# Patient Record
Sex: Female | Born: 1960 | Race: Black or African American | Hispanic: No | Marital: Married | Smoking: Former smoker
Health system: Southern US, Community
[De-identification: ages and names within clinical notes are randomized; demographics above are authoritative.]

## PROBLEM LIST (undated history)

## (undated) DIAGNOSIS — G629 Polyneuropathy, unspecified: Secondary | ICD-10-CM

## (undated) DIAGNOSIS — Z9641 Presence of insulin pump (external) (internal): Secondary | ICD-10-CM

## (undated) DIAGNOSIS — I10 Essential (primary) hypertension: Secondary | ICD-10-CM

## (undated) DIAGNOSIS — K5790 Diverticulosis of intestine, part unspecified, without perforation or abscess without bleeding: Secondary | ICD-10-CM

## (undated) DIAGNOSIS — K219 Gastro-esophageal reflux disease without esophagitis: Secondary | ICD-10-CM

## (undated) DIAGNOSIS — E7800 Pure hypercholesterolemia, unspecified: Secondary | ICD-10-CM

## (undated) DIAGNOSIS — F419 Anxiety disorder, unspecified: Secondary | ICD-10-CM

## (undated) DIAGNOSIS — M1990 Unspecified osteoarthritis, unspecified site: Secondary | ICD-10-CM

## (undated) HISTORY — PX: FOOT SURGERY: SHX648

## (undated) HISTORY — DX: Diverticulosis of intestine, part unspecified, without perforation or abscess without bleeding: K57.90

## (undated) HISTORY — DX: Unspecified osteoarthritis, unspecified site: M19.90

## (undated) HISTORY — PX: ABDOMINAL HYSTERECTOMY: SHX81

## (undated) HISTORY — DX: Polyneuropathy, unspecified: G62.9

---

## 2001-02-12 ENCOUNTER — Emergency Department (HOSPITAL_COMMUNITY): Admission: EM | Admit: 2001-02-12 | Discharge: 2001-02-12 | Payer: Self-pay | Admitting: Emergency Medicine

## 2001-02-13 ENCOUNTER — Ambulatory Visit (HOSPITAL_COMMUNITY): Admission: RE | Admit: 2001-02-13 | Discharge: 2001-02-13 | Payer: Self-pay | Admitting: Internal Medicine

## 2001-02-13 ENCOUNTER — Encounter: Payer: Self-pay | Admitting: Internal Medicine

## 2001-07-17 ENCOUNTER — Encounter: Payer: Self-pay | Admitting: Emergency Medicine

## 2001-07-17 ENCOUNTER — Emergency Department (HOSPITAL_COMMUNITY): Admission: EM | Admit: 2001-07-17 | Discharge: 2001-07-18 | Payer: Self-pay | Admitting: Emergency Medicine

## 2001-11-09 ENCOUNTER — Emergency Department (HOSPITAL_COMMUNITY): Admission: EM | Admit: 2001-11-09 | Discharge: 2001-11-09 | Payer: Self-pay | Admitting: Emergency Medicine

## 2001-11-09 ENCOUNTER — Emergency Department (HOSPITAL_COMMUNITY): Admission: EM | Admit: 2001-11-09 | Discharge: 2001-11-09 | Payer: Self-pay | Admitting: *Deleted

## 2002-01-03 ENCOUNTER — Encounter: Payer: Self-pay | Admitting: Emergency Medicine

## 2002-01-03 ENCOUNTER — Emergency Department (HOSPITAL_COMMUNITY): Admission: EM | Admit: 2002-01-03 | Discharge: 2002-01-03 | Payer: Self-pay | Admitting: Emergency Medicine

## 2002-01-28 ENCOUNTER — Emergency Department (HOSPITAL_COMMUNITY): Admission: EM | Admit: 2002-01-28 | Discharge: 2002-01-28 | Payer: Self-pay | Admitting: Emergency Medicine

## 2002-11-12 ENCOUNTER — Emergency Department (HOSPITAL_COMMUNITY): Admission: EM | Admit: 2002-11-12 | Discharge: 2002-11-13 | Payer: Self-pay | Admitting: Emergency Medicine

## 2002-11-12 ENCOUNTER — Encounter: Payer: Self-pay | Admitting: Emergency Medicine

## 2003-03-12 ENCOUNTER — Emergency Department (HOSPITAL_COMMUNITY): Admission: EM | Admit: 2003-03-12 | Discharge: 2003-03-12 | Payer: Self-pay | Admitting: Emergency Medicine

## 2003-03-12 ENCOUNTER — Encounter: Payer: Self-pay | Admitting: Emergency Medicine

## 2003-03-17 ENCOUNTER — Encounter: Payer: Self-pay | Admitting: Orthopedic Surgery

## 2003-03-17 ENCOUNTER — Ambulatory Visit: Admission: RE | Admit: 2003-03-17 | Discharge: 2003-03-17 | Payer: Self-pay | Admitting: Orthopedic Surgery

## 2003-08-02 ENCOUNTER — Emergency Department (HOSPITAL_COMMUNITY): Admission: EM | Admit: 2003-08-02 | Discharge: 2003-08-02 | Payer: Self-pay | Admitting: Emergency Medicine

## 2003-10-20 ENCOUNTER — Emergency Department (HOSPITAL_COMMUNITY): Admission: EM | Admit: 2003-10-20 | Discharge: 2003-10-20 | Payer: Self-pay | Admitting: Emergency Medicine

## 2004-06-09 ENCOUNTER — Emergency Department (HOSPITAL_COMMUNITY): Admission: EM | Admit: 2004-06-09 | Discharge: 2004-06-09 | Payer: Self-pay | Admitting: Specialist

## 2004-07-14 ENCOUNTER — Ambulatory Visit (HOSPITAL_COMMUNITY): Admission: RE | Admit: 2004-07-14 | Discharge: 2004-07-14 | Payer: Self-pay | Admitting: Obstetrics and Gynecology

## 2004-09-13 ENCOUNTER — Ambulatory Visit: Payer: Self-pay | Admitting: Orthopedic Surgery

## 2005-02-02 ENCOUNTER — Ambulatory Visit (HOSPITAL_COMMUNITY): Admission: RE | Admit: 2005-02-02 | Discharge: 2005-02-02 | Payer: Self-pay | Admitting: Podiatry

## 2005-06-07 ENCOUNTER — Ambulatory Visit (HOSPITAL_COMMUNITY): Admission: RE | Admit: 2005-06-07 | Discharge: 2005-06-07 | Payer: Self-pay | Admitting: Internal Medicine

## 2005-06-10 ENCOUNTER — Ambulatory Visit (HOSPITAL_COMMUNITY): Admission: RE | Admit: 2005-06-10 | Discharge: 2005-06-10 | Payer: Self-pay | Admitting: Internal Medicine

## 2006-02-15 ENCOUNTER — Emergency Department (HOSPITAL_COMMUNITY): Admission: EM | Admit: 2006-02-15 | Discharge: 2006-02-15 | Payer: Self-pay | Admitting: Emergency Medicine

## 2006-05-24 ENCOUNTER — Emergency Department (HOSPITAL_COMMUNITY): Admission: EM | Admit: 2006-05-24 | Discharge: 2006-05-24 | Payer: Self-pay | Admitting: Emergency Medicine

## 2006-11-01 ENCOUNTER — Emergency Department (HOSPITAL_COMMUNITY): Admission: EM | Admit: 2006-11-01 | Discharge: 2006-11-02 | Payer: Self-pay | Admitting: Emergency Medicine

## 2006-12-08 ENCOUNTER — Emergency Department (HOSPITAL_COMMUNITY): Admission: EM | Admit: 2006-12-08 | Discharge: 2006-12-09 | Payer: Self-pay | Admitting: Emergency Medicine

## 2006-12-11 ENCOUNTER — Ambulatory Visit (HOSPITAL_COMMUNITY): Admission: RE | Admit: 2006-12-11 | Discharge: 2006-12-11 | Payer: Self-pay | Admitting: Internal Medicine

## 2007-01-19 ENCOUNTER — Emergency Department (HOSPITAL_COMMUNITY): Admission: EM | Admit: 2007-01-19 | Discharge: 2007-01-19 | Payer: Self-pay | Admitting: Emergency Medicine

## 2007-05-30 ENCOUNTER — Emergency Department (HOSPITAL_COMMUNITY): Admission: EM | Admit: 2007-05-30 | Discharge: 2007-05-31 | Payer: Self-pay | Admitting: Emergency Medicine

## 2008-01-11 ENCOUNTER — Emergency Department (HOSPITAL_COMMUNITY): Admission: EM | Admit: 2008-01-11 | Discharge: 2008-01-11 | Payer: Self-pay | Admitting: Emergency Medicine

## 2008-06-05 ENCOUNTER — Emergency Department (HOSPITAL_COMMUNITY): Admission: EM | Admit: 2008-06-05 | Discharge: 2008-06-05 | Payer: Self-pay | Admitting: Emergency Medicine

## 2008-11-08 ENCOUNTER — Emergency Department (HOSPITAL_COMMUNITY): Admission: EM | Admit: 2008-11-08 | Discharge: 2008-11-08 | Payer: Self-pay | Admitting: Emergency Medicine

## 2009-01-23 ENCOUNTER — Emergency Department (HOSPITAL_COMMUNITY): Admission: EM | Admit: 2009-01-23 | Discharge: 2009-01-23 | Payer: Self-pay | Admitting: Emergency Medicine

## 2009-08-22 HISTORY — PX: CARDIAC CATHETERIZATION: SHX172

## 2009-10-31 ENCOUNTER — Ambulatory Visit: Payer: Self-pay | Admitting: Internal Medicine

## 2009-10-31 ENCOUNTER — Inpatient Hospital Stay (HOSPITAL_COMMUNITY): Admission: EM | Admit: 2009-10-31 | Discharge: 2009-11-04 | Payer: Self-pay | Admitting: Emergency Medicine

## 2009-11-02 ENCOUNTER — Ambulatory Visit: Payer: Self-pay | Admitting: Internal Medicine

## 2009-11-30 ENCOUNTER — Ambulatory Visit: Payer: Self-pay | Admitting: Internal Medicine

## 2009-11-30 DIAGNOSIS — E782 Mixed hyperlipidemia: Secondary | ICD-10-CM | POA: Insufficient documentation

## 2009-11-30 DIAGNOSIS — K5732 Diverticulitis of large intestine without perforation or abscess without bleeding: Secondary | ICD-10-CM

## 2009-11-30 DIAGNOSIS — F411 Generalized anxiety disorder: Secondary | ICD-10-CM | POA: Insufficient documentation

## 2009-11-30 DIAGNOSIS — K5900 Constipation, unspecified: Secondary | ICD-10-CM | POA: Insufficient documentation

## 2009-11-30 DIAGNOSIS — E785 Hyperlipidemia, unspecified: Secondary | ICD-10-CM

## 2009-12-02 ENCOUNTER — Encounter: Payer: Self-pay | Admitting: Internal Medicine

## 2009-12-07 ENCOUNTER — Ambulatory Visit (HOSPITAL_COMMUNITY): Admission: RE | Admit: 2009-12-07 | Discharge: 2009-12-07 | Payer: Self-pay | Admitting: Internal Medicine

## 2009-12-09 ENCOUNTER — Encounter: Payer: Self-pay | Admitting: Internal Medicine

## 2010-01-04 ENCOUNTER — Ambulatory Visit (HOSPITAL_COMMUNITY): Admission: RE | Admit: 2010-01-04 | Discharge: 2010-01-04 | Payer: Self-pay | Admitting: Internal Medicine

## 2010-01-04 ENCOUNTER — Ambulatory Visit: Payer: Self-pay | Admitting: Internal Medicine

## 2010-01-04 HISTORY — PX: COLONOSCOPY: SHX5424

## 2010-01-05 ENCOUNTER — Encounter: Payer: Self-pay | Admitting: Internal Medicine

## 2010-01-07 ENCOUNTER — Telehealth (INDEPENDENT_AMBULATORY_CARE_PROVIDER_SITE_OTHER): Payer: Self-pay

## 2010-02-01 ENCOUNTER — Encounter (INDEPENDENT_AMBULATORY_CARE_PROVIDER_SITE_OTHER): Payer: Self-pay | Admitting: General Surgery

## 2010-02-01 ENCOUNTER — Inpatient Hospital Stay (HOSPITAL_COMMUNITY): Admission: RE | Admit: 2010-02-01 | Discharge: 2010-02-05 | Payer: Self-pay | Admitting: General Surgery

## 2010-02-01 HISTORY — PX: OTHER SURGICAL HISTORY: SHX169

## 2010-02-24 ENCOUNTER — Encounter: Payer: Self-pay | Admitting: Internal Medicine

## 2010-05-03 ENCOUNTER — Emergency Department (HOSPITAL_COMMUNITY): Admission: EM | Admit: 2010-05-03 | Discharge: 2010-05-03 | Payer: Self-pay | Admitting: Emergency Medicine

## 2010-06-23 ENCOUNTER — Ambulatory Visit: Payer: Self-pay | Admitting: Cardiology

## 2010-06-23 ENCOUNTER — Encounter: Payer: Self-pay | Admitting: Emergency Medicine

## 2010-06-24 ENCOUNTER — Ambulatory Visit: Payer: Self-pay | Admitting: Cardiovascular Disease

## 2010-06-24 ENCOUNTER — Encounter (INDEPENDENT_AMBULATORY_CARE_PROVIDER_SITE_OTHER): Payer: Self-pay | Admitting: Pulmonary Disease

## 2010-06-24 ENCOUNTER — Ambulatory Visit (HOSPITAL_COMMUNITY): Admission: EM | Admit: 2010-06-24 | Discharge: 2010-06-25 | Payer: Self-pay | Admitting: Cardiovascular Disease

## 2010-09-08 ENCOUNTER — Emergency Department (HOSPITAL_COMMUNITY)
Admission: EM | Admit: 2010-09-08 | Discharge: 2010-09-08 | Payer: Self-pay | Source: Home / Self Care | Admitting: Emergency Medicine

## 2010-09-21 NOTE — Assessment & Plan Note (Signed)
Summary: diverticulitis,consult done rmr in hos. gu   Primary Care Provider:  [place]  Chief Complaint:  F/U hospital visit.  History of Present Illness: 50 y/o black female w/ hx uncomplicated diverticulitis admitted to [facility] last month and treated w/ cipro/flagyl.  Completed regimen.  c/o int LLQ pain post-prandially "feels like sharp pain & cramps."  Pain everyday last week 10/10 at worst.  Last mins-hrs.  Lots of nausea, no vomiting.  BM w/ some constipation 1 week ago.  Denies rectal bleeding, diarrhea or fever.  c/o occ regurgitation.  1 week ago, noticed boil left groin.  No drainage.    Current Problems (verified): 1)  Anxiety  (ICD-300.00) 2)  Hyperlipidemia  (ICD-272.4) 3)  Diverticulitis of Colon  (ICD-562.11)  Current Medications (verified): 1)  Est Estrogens-Methyltest 1.25-2.5 Mg Tabs (Est Estrogens-Methyltest) .... Take 1 Tablet By Mouth Once A Day 2)  Alprazolam 0.5 Mg Tabs (Alprazolam) .... As Needed 3)  Tylenol Arthritis .... As Needed 4)  Tylenol .... As Needed 5)  Fish Oil .... Take 1 Tablet By Mouth Once A Day 6)  Vitamin D .... Take 1 Tablet By Mouth Once A Day 7)  Hair Nail and Skin Vitamin .... Take 1 Tablet By Mouth Once A Day  Allergies (verified): 1)  ! Sulfa  Past History:  Past Medical History: diverticulitis depression hypercholesterolemia  Past Surgical History:  Total abdominal hysterectomy for apparent benign   uterine mass.  Has had orthopedic procedures in the past.        Family History: Granfather colon ca advanced age  Social History: Smokes one half a pack of cigarettes per day.  She is married.  She has one son.  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     Alcohol Use - no Illicit Drug Use - no Drug Use:  no  Review of Systems General:  Complains of fatigue and malaise; denies fever, chills, sweats, anorexia, weakness, weight loss, and sleep  disorder. CV:  Denies chest pains, angina, palpitations, syncope, dyspnea on exertion, orthopnea, PND, peripheral edema, and claudication. Resp:  Denies dyspnea at rest, dyspnea with exercise, cough, sputum, wheezing, coughing up blood, and pleurisy. GI:  Denies difficulty swallowing, pain on swallowing, vomiting, vomiting blood, jaundice, bloody BM's, black BMs, and fecal incontinence. GU:  Denies urinary burning, blood in urine, nocturnal urination, urinary frequency, and urinary incontinence. Derm:  Denies rash, itching, dry skin, hives, moles, warts, and unhealing ulcers. Psych:  Denies depression, anxiety, memory loss, suicidal ideation, hallucinations, paranoia, phobia, and confusion. Heme:  Denies bruising, bleeding, and enlarged lymph nodes.  Vital Signs:  Patient profile:   50 year old female Height:      63 inches Weight:      192 pounds BMI:     34.13 Temp:     98.1 degrees F oral Pulse rate:   80 / minute BP sitting:   124 / 82  (left arm) Cuff size:   regular  Vitals Entered By: Cloria Spring LPN (November 30, 2009 1:42 PM)  Physical Exam  General:  Well developed, well nourished, no acute distress. Head:  Normocephalic and atraumatic. Eyes:  Sclera clear, no icterus. Mouth:  No deformity or lesions, dentition normal. Neck:  Supple; no masses or thyromegaly. Lungs:  Clear throughout to auscultation. Heart:  Regular rate and rhythm; no murmurs, rubs,  or bruits. Abdomen:  Positive bowel sounds x 4, soft,ND, tender  to BLQ, without guarding and with rebound.   Rectal:  deferred until time of colonoscopy.   Genitalia:  Examined pt's left labia where she noted "boil" last week without evidence of significant lesion or exudates today. Msk:  Symmetrical with no gross deformities. Normal posture. Pulses:  Normal pulses noted. Extremities:  No clubbing, cyanosis, edema or deformities noted. Neurologic:  Alert and  oriented x4;  grossly normal neurologically. Skin:  Intact  without significant lesions or rashes. Cervical Nodes:  No significant cervical adenopathy. Axillary Nodes:  No significant axillary adenopathy. Inguinal Nodes:  No significant inguinal adenopathy. Psych:  Alert and cooperative. Normal mood and affect.  Impression & Recommendations:  Problem # 1:  DIVERTICULITIS OF COLON (ICD-562.11)  50 y/o black female w/ recent uncomplicated diverticulitis requiring hospital admission 1 month ago here for follow-up.  She is having persistent abd pain post antibiotics, mostly LLQ.  She also has chronic constipation which may be contributing to her pain, but  I feel we need to r/o refractory diverticulitis.  She also needs colonoscopy to r/o underlying pathology and this will be scheduled once diverticulitis has resolved.  Orders: Est. Patient Level III (16109)  Problem # 2:  CONSTIPATION (ICD-564.00)  See #1  Orders: Est. Patient Level III (60454)  Patient Instructions: 1)  Use vicodin as needed for pain 2)  To ER if severe pain, fever, Nausea or vomiting. 3)  I will call w/ CT results. Prescriptions: VICODIN 5-500 MG TABS (HYDROCODONE-ACETAMINOPHEN) 1 by mouth q 4-6 hrs as needed pain  #20 x 0   Entered and Authorized by:   Joselyn Arrow FNP-BC   Signed by:   Joselyn Arrow FNP-BC on 11/30/2009   Method used:   Print then Give to Patient   RxID:   [id number]

## 2010-09-21 NOTE — Letter (Signed)
Summary: discharge summary-dr. Lovell Sheehan  discharge summary-dr. Lovell Sheehan   Imported By: Rosine Beat 02/24/2010 17:01:41  _____________________________________________________________________  External Attachment:    Type:   Image     Comment:   External Document

## 2010-09-21 NOTE — Letter (Signed)
Summary: CT SCAN ORDER  CT SCAN ORDER   Imported By: Ave Filter 12/02/2009 10:32:11  _____________________________________________________________________  External Attachment:    Type:   Image     Comment:   External Document

## 2010-09-21 NOTE — Letter (Signed)
Summary: Patient Notice, Colon Biopsy Results  [facility]  [address]   [place], [place] [address]   Phone: [phone]  Fax: [phone]       Jan 05, 2010   Sarah [name] [address] [place], [place]  [address] [birth year]    Dear Ms. Glock,  I am pleased to inform you that the biopsies taken during your recent colonoscopy did not show any evidence of cancer upon pathologic examination.  Additional information/recommendations:  You should have a repeat colonoscopy examination  in 10 years.  Please call us if you are having persistent problems or have questions about your condition that have not been fully answered at this time.  Sincerely,    R. Roetta Sessions MD, FACP [facility] Ph: [phone]    Fax: [phone]   Appended Document: Patient Notice, Colon Biopsy Results Reminder placed in IDX.

## 2010-09-21 NOTE — Letter (Signed)
Summary: TCS ORDER  TCS ORDER   Imported By: Ave Filter 12/09/2009 10:43:49  _____________________________________________________________________  External Attachment:    Type:   Image     Comment:   External Document

## 2010-09-21 NOTE — Progress Notes (Signed)
Summary: phone note  Phone Note Call from Patient   Caller: Patient Summary of Call: Pt called to get results of her TCS/ path. I gave her the info. She asked if Dr. Jena Gauss wanted her to have surgery. I read her the recommendations of the elective segmental resection of colon. She said she wanted to know if Dr. Jena Gauss thought that she would benefit from it. She also said she had some blood in her urine, and i told her she probably should see PCP for that. Told her i would make Dr. Jena Gauss aware. Initial call taken by: Cloria Spring LPN,  Jan 07, 2010 9:18 AM     Appended Document: phone note as discussed w her on the day of procedure; she should see a surgeon to explore the option of having a partial resection to lessen the chances of a future severe bout of diverticulitis  Appended Document: phone note LM to call.  Appended Document: phone note Pt would like for you to recommend surgeon. No preference of local or out of town.  Appended Document: phone note Dr. Lovell Sheehan  Appended Document: phone note LMOM to call.  Appended Document: phone note Pt informed.

## 2010-09-21 NOTE — Consult Note (Signed)
Summary: Consultation Report  Consultation Report   Imported By: Diana Eves 11/02/2009 14:47:34  _____________________________________________________________________  External Attachment:    Type:   Image     Comment:   External Document

## 2010-09-30 ENCOUNTER — Emergency Department (HOSPITAL_COMMUNITY)
Admission: EM | Admit: 2010-09-30 | Discharge: 2010-10-01 | Disposition: A | Discharge status: Home / Self Care | Payer: BC Managed Care – PPO | Attending: Emergency Medicine | Admitting: Emergency Medicine

## 2010-09-30 DIAGNOSIS — R109 Unspecified abdominal pain: Secondary | ICD-10-CM | POA: Insufficient documentation

## 2010-09-30 DIAGNOSIS — J029 Acute pharyngitis, unspecified: Secondary | ICD-10-CM | POA: Insufficient documentation

## 2010-09-30 DIAGNOSIS — H6690 Otitis media, unspecified, unspecified ear: Secondary | ICD-10-CM | POA: Insufficient documentation

## 2010-09-30 LAB — COMPREHENSIVE METABOLIC PANEL
ALT: 19 U/L (ref 0–35)
AST: 14 U/L (ref 0–37)
Albumin: 3.3 g/dL — ABNORMAL LOW (ref 3.5–5.2)
Alkaline Phosphatase: 65 U/L (ref 39–117)
BUN: 13 mg/dL (ref 6–23)
Calcium: 9.3 mg/dL (ref 8.4–10.5)
Creatinine, Ser: 0.66 mg/dL (ref 0.4–1.2)
GFR calc Af Amer: >60 mL/min (ref >60)
Potassium: 4 mEq/L (ref 3.5–5.1)
Sodium: 139 mEq/L (ref 135–145)
Total Bilirubin: 0.4 mg/dL (ref 0.3–1.2)
Total Protein: 7 g/dL (ref 6.0–8.3)

## 2010-09-30 LAB — RAPID STREP SCREEN (MED CTR MEBANE ONLY): Streptococcus, Group A Screen (Direct): NEGATIVE

## 2010-09-30 LAB — URINALYSIS, ROUTINE W REFLEX MICROSCOPIC
Bilirubin Urine: NEGATIVE
Hgb urine dipstick: NEGATIVE
Ketones, ur: NEGATIVE mg/dL
Nitrite: NEGATIVE
Specific Gravity, Urine: 1.02 (ref 1.005–1.030)
Urine Glucose, Fasting: NEGATIVE mg/dL
Urobilinogen, UA: 0.2 mg/dL (ref 0.0–1.0)
pH: 7 (ref 5.0–8.0)

## 2010-10-01 LAB — DIFFERENTIAL
Basophils Relative: 0 % (ref 0–1)
Eosinophils Absolute: 0.2 10*3/uL (ref 0.0–0.7)
Lymphs Abs: 5.1 10*3/uL — ABNORMAL HIGH (ref 0.7–4.0)
Monocytes Absolute: 1 10*3/uL (ref 0.1–1.0)
Neutro Abs: 4.5 10*3/uL (ref 1.7–7.7)
Neutrophils Relative %: 42 % — ABNORMAL LOW (ref 43–77)

## 2010-10-01 LAB — CBC
HCT: 35 % — ABNORMAL LOW (ref 36.0–46.0)
MCH: 29.8 pg (ref 26.0–34.0)
MCHC: 36.9 g/dL — ABNORMAL HIGH (ref 30.0–36.0)
MCV: 80.8 fL (ref 78.0–100.0)
RDW: 14.5 % (ref 11.5–15.5)

## 2010-11-02 LAB — CARDIAC PANEL(CRET KIN+CKTOT+MB+TROPI)
CK, MB: 1.6 ng/mL (ref 0.3–4.0)
Relative Index: 1.6 (ref 0.0–2.5)
Total CK: 103 U/L (ref 7–177)
Total CK: 117 U/L (ref 7–177)
Troponin I: 0.01 ng/mL (ref 0.00–0.06)

## 2010-11-02 LAB — COMPREHENSIVE METABOLIC PANEL
AST: 17 U/L (ref 0–37)
Albumin: 3.6 g/dL (ref 3.5–5.2)
BUN: 7 mg/dL (ref 6–23)
Calcium: 8.9 mg/dL (ref 8.4–10.5)
Creatinine, Ser: 0.65 mg/dL (ref 0.4–1.2)
GFR calc Af Amer: >60 mL/min (ref >60)
GFR calc non Af Amer: >60 mL/min (ref >60)

## 2010-11-02 LAB — CBC
MCH: 29.8 pg (ref 26.0–34.0)
MCHC: 34.2 g/dL (ref 30.0–36.0)
Platelets: 319 10*3/uL (ref 150–400)

## 2010-11-02 LAB — DIFFERENTIAL
Eosinophils Relative: 2 % (ref 0–5)
Lymphocytes Relative: 44 % (ref 12–46)
Lymphs Abs: 3.4 10*3/uL (ref 0.7–4.0)
Neutro Abs: 3.8 10*3/uL (ref 1.7–7.7)

## 2010-11-02 LAB — POCT CARDIAC MARKERS
CKMB, poc: 1.2 ng/mL (ref 1.0–8.0)
Myoglobin, poc: 55.5 ng/mL (ref 12–200)

## 2010-11-02 LAB — MRSA PCR SCREENING: MRSA by PCR: NEGATIVE

## 2010-11-02 LAB — D-DIMER, QUANTITATIVE: D-Dimer, Quant: 0.3 ug/mL-FEU (ref 0.00–0.48)

## 2010-11-03 LAB — BASIC METABOLIC PANEL
BUN: 6 mg/dL (ref 6–23)
Calcium: 8.6 mg/dL (ref 8.4–10.5)
Chloride: 107 mEq/L (ref 96–112)
Creatinine, Ser: 0.66 mg/dL (ref 0.4–1.2)
GFR calc Af Amer: >60 mL/min (ref >60)
GFR calc non Af Amer: >60 mL/min (ref >60)

## 2010-11-03 LAB — CBC
Platelets: 339 10*3/uL (ref 150–400)
RBC: 4.55 MIL/uL (ref 3.87–5.11)
RDW: 14.7 % (ref 11.5–15.5)
WBC: 8.1 10*3/uL (ref 4.0–10.5)

## 2010-11-07 LAB — BASIC METABOLIC PANEL
Calcium: 8.5 mg/dL (ref 8.4–10.5)
GFR calc Af Amer: >60 mL/min (ref >60)
GFR calc non Af Amer: >60 mL/min (ref >60)
Glucose, Bld: 128 mg/dL — ABNORMAL HIGH (ref 70–99)
Potassium: 3.7 mEq/L (ref 3.5–5.1)
Sodium: 136 mEq/L (ref 135–145)

## 2010-11-07 LAB — DIFFERENTIAL
Basophils Absolute: 0 10*3/uL (ref 0.0–0.1)
Eosinophils Relative: 1 % (ref 0–5)
Lymphocytes Relative: 29 % (ref 12–46)
Lymphs Abs: 3.1 10*3/uL (ref 0.7–4.0)
Monocytes Absolute: 1 10*3/uL (ref 0.1–1.0)
Neutro Abs: 6.6 10*3/uL (ref 1.7–7.7)

## 2010-11-07 LAB — CBC
HCT: 36.4 % (ref 36.0–46.0)
Hemoglobin: 12.4 g/dL (ref 12.0–15.0)
RDW: 14.7 % (ref 11.5–15.5)
WBC: 11 10*3/uL — ABNORMAL HIGH (ref 4.0–10.5)

## 2010-11-07 LAB — ALBUMIN: Albumin: 3 g/dL — ABNORMAL LOW (ref 3.5–5.2)

## 2010-11-07 LAB — MAGNESIUM: Magnesium: 1.8 mg/dL (ref 1.5–2.5)

## 2010-11-08 LAB — DIFFERENTIAL
Basophils Absolute: 0 10*3/uL (ref 0.0–0.1)
Eosinophils Relative: 1 % (ref 0–5)
Lymphocytes Relative: 27 % (ref 12–46)
Neutrophils Relative %: 63 % (ref 43–77)

## 2010-11-08 LAB — CBC
HCT: 35.3 % — ABNORMAL LOW (ref 36.0–46.0)
Hemoglobin: 13.9 g/dL (ref 12.0–15.0)
Platelets: 293 10*3/uL (ref 150–400)
RBC: 4.6 MIL/uL (ref 3.87–5.11)
RDW: 14.4 % (ref 11.5–15.5)
WBC: 7.9 10*3/uL (ref 4.0–10.5)

## 2010-11-08 LAB — CROSSMATCH
ABO/RH(D): B POS
Antibody Screen: NEGATIVE

## 2010-11-08 LAB — BASIC METABOLIC PANEL
BUN: 5 mg/dL — ABNORMAL LOW (ref 6–23)
Calcium: 8 mg/dL — ABNORMAL LOW (ref 8.4–10.5)
Calcium: 9.2 mg/dL (ref 8.4–10.5)
Chloride: 106 mEq/L (ref 96–112)
Creatinine, Ser: 0.56 mg/dL (ref 0.4–1.2)
Creatinine, Ser: 0.67 mg/dL (ref 0.4–1.2)
GFR calc Af Amer: >60 mL/min (ref >60)
GFR calc non Af Amer: >60 mL/min (ref >60)
Glucose, Bld: 125 mg/dL — ABNORMAL HIGH (ref 70–99)
Potassium: 3.8 mEq/L (ref 3.5–5.1)
Sodium: 138 mEq/L (ref 135–145)

## 2010-11-08 LAB — ABO/RH: ABO/RH(D): B POS

## 2010-11-08 LAB — MAGNESIUM: Magnesium: 1.6 mg/dL (ref 1.5–2.5)

## 2010-11-08 LAB — GLUCOSE, CAPILLARY: Glucose-Capillary: 118 mg/dL — ABNORMAL HIGH (ref 70–99)

## 2010-11-08 LAB — SURGICAL PCR SCREEN

## 2010-11-12 LAB — CBC
HCT: 38.6 % (ref 36.0–46.0)
Hemoglobin: 14.4 g/dL (ref 12.0–15.0)
MCHC: 35.3 g/dL (ref 30.0–36.0)
MCV: 87.7 fL (ref 78.0–100.0)
Platelets: 318 10*3/uL (ref 150–400)
RDW: 13.5 % (ref 11.5–15.5)
RDW: 13.6 % (ref 11.5–15.5)
RDW: 13.9 % (ref 11.5–15.5)
WBC: 7.9 10*3/uL (ref 4.0–10.5)

## 2010-11-12 LAB — COMPREHENSIVE METABOLIC PANEL
ALT: 20 U/L (ref 0–35)
AST: 17 U/L (ref 0–37)
Albumin: 3.5 g/dL (ref 3.5–5.2)
Alkaline Phosphatase: 62 U/L (ref 39–117)
GFR calc Af Amer: >60 mL/min (ref >60)
Glucose, Bld: 141 mg/dL — ABNORMAL HIGH (ref 70–99)
Potassium: 3.3 mEq/L — ABNORMAL LOW (ref 3.5–5.1)
Sodium: 139 mEq/L (ref 135–145)
Total Protein: 7.1 g/dL (ref 6.0–8.3)

## 2010-11-12 LAB — DIFFERENTIAL
Basophils Absolute: 0 10*3/uL (ref 0.0–0.1)
Basophils Absolute: 0.1 10*3/uL (ref 0.0–0.1)
Basophils Relative: 0 % (ref 0–1)
Basophils Relative: 1 % (ref 0–1)
Eosinophils Absolute: 0.1 10*3/uL (ref 0.0–0.7)
Eosinophils Absolute: 0.2 10*3/uL (ref 0.0–0.7)
Eosinophils Relative: 1 % (ref 0–5)
Lymphocytes Relative: 34 % (ref 12–46)
Lymphs Abs: 2.7 10*3/uL (ref 0.7–4.0)
Lymphs Abs: 3.7 10*3/uL (ref 0.7–4.0)
Monocytes Absolute: 0.9 10*3/uL (ref 0.1–1.0)
Monocytes Absolute: 1.2 10*3/uL — ABNORMAL HIGH (ref 0.1–1.0)
Monocytes Relative: 11 % (ref 3–12)
Monocytes Relative: 7 % (ref 3–12)
Neutro Abs: 4.1 10*3/uL (ref 1.7–7.7)
Neutro Abs: 6 10*3/uL (ref 1.7–7.7)
Neutrophils Relative %: 55 % (ref 43–77)
Neutrophils Relative %: 64 % (ref 43–77)

## 2010-11-12 LAB — BASIC METABOLIC PANEL
BUN: 4 mg/dL — ABNORMAL LOW (ref 6–23)
CO2: 30 mEq/L (ref 19–32)
Calcium: 8.6 mg/dL (ref 8.4–10.5)
Calcium: 8.8 mg/dL (ref 8.4–10.5)
Chloride: 102 mEq/L (ref 96–112)
Creatinine, Ser: 0.69 mg/dL (ref 0.4–1.2)
GFR calc non Af Amer: >60 mL/min (ref >60)
Glucose, Bld: 109 mg/dL — ABNORMAL HIGH (ref 70–99)
Glucose, Bld: 117 mg/dL — ABNORMAL HIGH (ref 70–99)
Potassium: 3.8 mEq/L (ref 3.5–5.1)
Sodium: 138 mEq/L (ref 135–145)

## 2010-11-12 LAB — URINALYSIS, ROUTINE W REFLEX MICROSCOPIC
Glucose, UA: NEGATIVE mg/dL
Hgb urine dipstick: NEGATIVE
Protein, ur: NEGATIVE mg/dL
pH: 6 (ref 5.0–8.0)

## 2010-11-12 LAB — URINE CULTURE
Colony Count: NO GROWTH
Culture: NO GROWTH

## 2010-12-10 ENCOUNTER — Emergency Department (HOSPITAL_COMMUNITY): Payer: BC Managed Care – PPO

## 2010-12-10 ENCOUNTER — Inpatient Hospital Stay (HOSPITAL_COMMUNITY)
Admission: EM | Admit: 2010-12-10 | Discharge: 2010-12-14 | DRG: 294 | Disposition: A | Discharge status: Home / Self Care | Payer: BC Managed Care – PPO | Attending: Internal Medicine | Admitting: Internal Medicine

## 2010-12-10 DIAGNOSIS — Z833 Family history of diabetes mellitus: Secondary | ICD-10-CM

## 2010-12-10 DIAGNOSIS — B373 Candidiasis of vulva and vagina: Secondary | ICD-10-CM | POA: Diagnosis present

## 2010-12-10 DIAGNOSIS — F341 Dysthymic disorder: Secondary | ICD-10-CM | POA: Diagnosis present

## 2010-12-10 DIAGNOSIS — IMO0001 Reserved for inherently not codable concepts without codable children: Principal | ICD-10-CM | POA: Diagnosis present

## 2010-12-10 DIAGNOSIS — B3731 Acute candidiasis of vulva and vagina: Secondary | ICD-10-CM | POA: Diagnosis present

## 2010-12-10 DIAGNOSIS — E7800 Pure hypercholesterolemia, unspecified: Secondary | ICD-10-CM | POA: Diagnosis present

## 2010-12-10 LAB — CBC
HCT: 37.6 % (ref 36.0–46.0)
Hemoglobin: 14 g/dL (ref 12.0–15.0)
MCV: 81 fL (ref 78.0–100.0)
RBC: 4.64 MIL/uL (ref 3.87–5.11)
RDW: 13.5 % (ref 11.5–15.5)
WBC: 7.2 10*3/uL (ref 4.0–10.5)

## 2010-12-10 LAB — DIFFERENTIAL
Basophils Absolute: 0 10*3/uL (ref 0.0–0.1)
Lymphocytes Relative: 40 % (ref 12–46)
Lymphs Abs: 2.8 10*3/uL (ref 0.7–4.0)
Neutro Abs: 3.9 10*3/uL (ref 1.7–7.7)
Neutrophils Relative %: 55 % (ref 43–77)

## 2010-12-10 LAB — GLUCOSE, CAPILLARY
Glucose-Capillary: 494 mg/dL — ABNORMAL HIGH (ref 70–99)
Glucose-Capillary: 373 mg/dL — ABNORMAL HIGH (ref 70–99)
Glucose-Capillary: 292 mg/dL — ABNORMAL HIGH (ref 70–99)
Glucose-Capillary: 266 mg/dL — ABNORMAL HIGH (ref 70–99)
Glucose-Capillary: 161 mg/dL — ABNORMAL HIGH (ref 70–99)
Glucose-Capillary: 180 mg/dL — ABNORMAL HIGH (ref 70–99)
Glucose-Capillary: 205 mg/dL — ABNORMAL HIGH (ref 70–99)

## 2010-12-10 LAB — URINALYSIS, ROUTINE W REFLEX MICROSCOPIC
Bilirubin Urine: NEGATIVE
Ketones, ur: 40 mg/dL — AB
Leukocytes, UA: NEGATIVE
Nitrite: NEGATIVE
Specific Gravity, Urine: 1.01 (ref 1.005–1.030)
Urobilinogen, UA: 0.2 mg/dL (ref 0.0–1.0)

## 2010-12-10 LAB — BASIC METABOLIC PANEL
CO2: 27 mEq/L (ref 19–32)
Calcium: 9 mg/dL (ref 8.4–10.5)
Creatinine, Ser: 0.74 mg/dL (ref 0.4–1.2)
GFR calc Af Amer: >60 mL/min (ref >60)
Glucose, Bld: 508 mg/dL — ABNORMAL HIGH (ref 70–99)

## 2010-12-10 LAB — BLOOD GAS, ARTERIAL
Bicarbonate: 23.9 mEq/L (ref 20.0–24.0)
TCO2: 21.3 mmol/L (ref 0–100)
pCO2 arterial: 39.8 mmHg (ref 35.0–45.0)
pH, Arterial: 7.396 (ref 7.350–7.400)
pO2, Arterial: 82 mmHg (ref 80.0–100.0)

## 2010-12-10 LAB — PREGNANCY, URINE: Preg Test, Ur: NEGATIVE

## 2010-12-10 LAB — CARDIAC PANEL(CRET KIN+CKTOT+MB+TROPI): Total CK: 125 U/L (ref 7–177)

## 2010-12-10 LAB — POCT CARDIAC MARKERS: Troponin i, poc: <0.05 ng/mL (ref 0.00–0.09)

## 2010-12-11 LAB — DIFFERENTIAL
Basophils Relative: 0 % (ref 0–1)
Eosinophils Absolute: 0.1 10*3/uL (ref 0.0–0.7)
Lymphs Abs: 4.3 10*3/uL — ABNORMAL HIGH (ref 0.7–4.0)
Monocytes Absolute: 0.4 10*3/uL (ref 0.1–1.0)
Monocytes Relative: 7 % (ref 3–12)

## 2010-12-11 LAB — LIPID PANEL
Cholesterol: 172 mg/dL (ref 0–200)
HDL: 19 mg/dL — ABNORMAL LOW (ref >39)
LDL Cholesterol: 126 mg/dL — ABNORMAL HIGH (ref 0–99)
Total CHOL/HDL Ratio: 9.1 RATIO
Triglycerides: 137 mg/dL (ref <150)

## 2010-12-11 LAB — GLUCOSE, CAPILLARY
Glucose-Capillary: 110 mg/dL — ABNORMAL HIGH (ref 70–99)
Glucose-Capillary: 146 mg/dL — ABNORMAL HIGH (ref 70–99)
Glucose-Capillary: 156 mg/dL — ABNORMAL HIGH (ref 70–99)
Glucose-Capillary: 243 mg/dL — ABNORMAL HIGH (ref 70–99)
Glucose-Capillary: 249 mg/dL — ABNORMAL HIGH (ref 70–99)
Glucose-Capillary: 297 mg/dL — ABNORMAL HIGH (ref 70–99)

## 2010-12-11 LAB — COMPREHENSIVE METABOLIC PANEL
AST: 17 U/L (ref 0–37)
BUN: 5 mg/dL — ABNORMAL LOW (ref 6–23)
CO2: 26 mEq/L (ref 19–32)
Calcium: 8.1 mg/dL — ABNORMAL LOW (ref 8.4–10.5)
Chloride: 103 mEq/L (ref 96–112)
Creatinine, Ser: 0.6 mg/dL (ref 0.4–1.2)
GFR calc Af Amer: >60 mL/min (ref >60)
GFR calc non Af Amer: >60 mL/min (ref >60)
Total Bilirubin: 0.4 mg/dL (ref 0.3–1.2)

## 2010-12-11 LAB — CARDIAC PANEL(CRET KIN+CKTOT+MB+TROPI)
CK, MB: 1.9 ng/mL (ref 0.3–4.0)
Relative Index: 1.6 (ref 0.0–2.5)
Total CK: 116 U/L (ref 7–177)

## 2010-12-11 LAB — CBC
MCH: 29 pg (ref 26.0–34.0)
MCHC: 35.6 g/dL (ref 30.0–36.0)
MCV: 81.3 fL (ref 78.0–100.0)
Platelets: 284 10*3/uL (ref 150–400)

## 2010-12-11 LAB — T4, FREE: Free T4: 0.99 ng/dL (ref 0.80–1.80)

## 2010-12-12 LAB — GLUCOSE, CAPILLARY
Glucose-Capillary: 312 mg/dL — ABNORMAL HIGH (ref 70–99)
Glucose-Capillary: 231 mg/dL — ABNORMAL HIGH (ref 70–99)
Glucose-Capillary: 259 mg/dL — ABNORMAL HIGH (ref 70–99)

## 2010-12-12 LAB — COMPREHENSIVE METABOLIC PANEL
ALT: 20 U/L (ref 0–35)
AST: 22 U/L (ref 0–37)
Albumin: 2.8 g/dL — ABNORMAL LOW (ref 3.5–5.2)
Alkaline Phosphatase: 68 U/L (ref 39–117)
BUN: 5 mg/dL — ABNORMAL LOW (ref 6–23)
Chloride: 103 mEq/L (ref 96–112)
GFR calc Af Amer: >60 mL/min (ref >60)
Potassium: 3.8 mEq/L (ref 3.5–5.1)
Sodium: 135 mEq/L (ref 135–145)
Total Protein: 6 g/dL (ref 6.0–8.3)

## 2010-12-13 LAB — GLUCOSE, CAPILLARY
Glucose-Capillary: 167 mg/dL — ABNORMAL HIGH (ref 70–99)
Glucose-Capillary: 110 mg/dL — ABNORMAL HIGH (ref 70–99)

## 2010-12-13 LAB — BASIC METABOLIC PANEL
BUN: 7 mg/dL (ref 6–23)
GFR calc Af Amer: >60 mL/min (ref >60)
GFR calc non Af Amer: >60 mL/min (ref >60)
Potassium: 3.8 mEq/L (ref 3.5–5.1)

## 2010-12-14 LAB — GLUCOSE, CAPILLARY: Glucose-Capillary: 115 mg/dL — ABNORMAL HIGH (ref 70–99)

## 2010-12-15 NOTE — Discharge Summary (Signed)
  Sarah [name], Sarah [name]               ACCOUNT NO.:  [id number]  MEDICAL RECORD NO.:  [id number]           PATIENT TYPE:  I  LOCATION:  A335                          FACILITY:  [facility]  PHYSICIAN:  Babs Dabbs D. Felecia Shelling, MD   DATE OF BIRTH:  1960/09/13  DATE OF ADMISSION:  12/10/2010 DATE OF DISCHARGE:  04/24/2012LH                              DISCHARGE SUMMARY   DISCHARGE DIAGNOSES: 1. New-onset diabetes mellitus. 2. Hyperlipidemia. 3. History of anxiety/depression disorder.  DISCHARGE MEDICATIONS: 1. NovoLog insulin 25 units t.i.d. with meals. 2. Lantus insulin 80 mg subcu at bedtime. 3. Metformin 500 mg b.i.d. 4. Simvastatin 20 mg p.o. daily. 5. Vicodin 5/325 one to two tablets q.4 h. p.r.n. 6. MiraLax 17 g b.i.d. 7. Xanax 0.5 mg daily as needed for anxiety.  DISPOSITION:  The patient will be discharged to home in stable condition.  DISCHARGE INSTRUCTIONS:  The patient has been instructed to check her blood sugar after each meal and continue her insulin.  She will be followed in the office in 1 week and she will also have an appointment with Dr. Fransico Him, endocrinologist.  LABORATORY DATA ON DISCHARGE:  BMP; sodium 137, potassium 3.8, chloride 101, carbon dioxide 28, glucose 162, BUN 7, creatinine 0.56, calcium 8.6.  TSH 1.45.  Hemoglobin A1c 10.8.  Total cholesterol 172, triglyceride 137, LDL 126, HDL 19.  HOSPITAL COURSE:  This is a 50 year old female patient who came to the emergency room due to polyuria, polydipsia and nausea and vomiting of 2- 3 days' duration.  During evaluation in the emergency room, the patient was found to have blood sugar above 500 mg per day.  She has a history of diabetes mellitus in the family.  The patient was then started on IV fluids and insulin drip.  The patient was then admitted for further treatment.  She was seen by Dr. Fransico Him and she was started on combination of oral hypoglycemic agent and insulin.  Over hospital stay, the patient's blood  sugar has been gradually controlled.  She received diabetic education.  The patient is going to be discharged on metformin and Lantus insulin and NovoLog with meals.  She will be followed in the office in 1-week duration.     Zanasia Hickson D. Felecia Shelling, MD     TDF/MEDQ  D:  12/14/2010  T:  12/14/2010  Job:  [id number]  Electronically Signed by Avon Gully MD on 12/15/2010 08:33:53 AM

## 2010-12-28 NOTE — H&P (Signed)
NAMEJAYCELYNN, Sarah [name]               ACCOUNT NO.:  [id number]  MEDICAL RECORD NO.:  [id number]           PATIENT TYPE:  I  LOCATION:  IC11                          FACILITY:  [facility]  PHYSICIAN:  Seryna Marek, MD DATE OF BIRTH:  1961-06-22  DATE OF ADMISSION:  12/10/2010 DATE OF DISCHARGE:  LH                             HISTORY & PHYSICAL   REASON FOR ADMISSION:  Newly diagnosed type 2 diabetes.  HISTORY OF PRESENT ILLNESS:  This is a 50 year old African American female with medical history significant for depression, hypercholesterolemia, hyperlipidemia, ongoing tobacco abuse, hypertension presented to ER with a complaint of polyuria, polydipsia for approximately 2-3 days associated with nausea.  She was found to have severe hyperglycemia with no ketosis, hospitalized with a diagnosis of newly diagnosed type 2 diabetes, currently on the glucometer, IV insulin stabilization of glycemia.  She denied any chest pain or fever associated with this presentation.  Denied any headaches, nausea, or bleeding.  Her evaluation at ER included EKG which was normal sinus.  PAST MEDICAL HISTORY:  As above.  PAST SURGICAL HISTORY:  Partial colectomy for diverticular disease, coronary catheterization in November 2011 which was significant for minor irregularities in the left anterior descending artery with essentially normal coronaries, hysterectomy, ovarian cystectomy, bunionectomy, knee arthroscopic surgery.  HOME MEDICATIONS:  Premarin  1.25 mg once a day.  SOCIAL HISTORY:  Positive for smoking.  No alcohol or no drug.  FAMILY HISTORY:  No coronary artery disease.  Noncontributory otherwise.  REVIEW OF SYSTEMS:  As in HPI.  REVIEW OF SYSTEMS:  As in HPI.  No dysuria, however, positive for polyuria and polydipsia.  Negative for any skin rash.  Negative for any headaches or bleeding.  She did have some blurry vision for some time.  PHYSICAL EXAMINATION:  GENERAL:  She is alert and  oriented x3, currently on IV insulin therapy and not in acute distress. HEENT:  Moist mucous membranes.  Negative for JVD or thyromegaly. CHEST:  Clear to auscultation bilaterally. CARDIOVASCULAR:  Normal S1 and S2.  No murmur, no gallop. ABDOMEN:  Soft and nontender. EXTREMITIES:  No edema. CNS:  Nonfocal. SKIN:  Has no rash, no hyperemia.  Her lab work showed sodium 129, potassium 3.9, chloride 95, bicarb 27, glucose 508, BUN 9, creatinine 0.74.  EKG normal sinus.  ASSESSMENT AND PLAN: 1. Newly diagnosed type 2 diabetes, A1c unknown, likely high.  The     patient will be continued on IV insulin to stabilize her glycemia     to near normal after which we will initiate basal bolus insulin.     She will also benefit from insulin sensitizer on discharge and she     will be provided with nutritional advise on discharge and will be     scheduled for endocrinology outpatient followup. 2. Hypertension.  We will initiate the patient on low-dose ACE     inhibitor for blood pressure control and renal protection. 3. Deep venous thrombosis prophylaxis with Lovenox.  Consult diabetes     education.  Diet 1800 calorie ADA diet and her care to be resumed     by  Dr. Felecia Shelling in-house.                                           ______________________________ Purcell Nails, MD     GN/MEDQ  D:  12/10/2010  T:  12/11/2010  Job:  [id number]  Electronically Signed by Purcell Nails MD on 12/28/2010 01:31:31 PM

## 2011-01-07 NOTE — Op Note (Signed)
NAMEACHOL, AZPEITIA               ACCOUNT NO.:  [id number]   MEDICAL RECORD NO.:  [id number]          PATIENT TYPE:  AMB   LOCATION:  DAY                           FACILITY:  [facility]   PHYSICIAN:  Cody M. Ulice Brilliant, D.P.M.  DATE OF BIRTH:  April 08, 1961   DATE OF PROCEDURE:  02/02/2005  DATE OF DISCHARGE:                                 OPERATIVE REPORT   PREOPERATIVE DIAGNOSIS:  Tarsal tunnel symptoms, right medial ankle.   POSTOPERATIVE DIAGNOSIS:  Tarsal tunnel symptoms, right medial ankle.   PROCEDURE PERFORMED:  Posterior tibial nerve decompression, right medial  ankle.   SURGEON:  Denny Peon. Ulice Brilliant, D.P.M.   ANESTHESIA:  Monitored anesthesia care.   INDICATION FOR SURGERY:  Nine-month history of burning lancinating pain in  medial ankle to heel, running back up leg, treated conservatively with  injection therapy and oral Lyrica therapy, which offered temporary relief  only.  The patient continues to have burning pain in medial ankle.  Neurological studies do indicate tarsal tunnel syndrome with  electrodiagnostic evidence of the above.   DESCRIPTION OF PROCEDURE:  Ms. Lortz is brought into the OR and placed on  the table in a supine position.  IV sedation is established.  A local block  is performed utilizing 20 mL of 1% lidocaine with epinephrine about the  medial ankle near the operative field.  The foot is then prepped and draped  without the use of a tourniquet in the standard aseptic fashion.   PROCEDURE:  Posterior tibial nerve decompression, right medial ankle.   Attention is directed to this right medial ankle.  The patient, in the  supine position, is placed with right shoulder down with the table tilted  approximately 10-15 degrees.  The operative incision is planned out  utilizing a skin marking pen encompassing the total width of the lacinate  ligament.  The incision is made with a #15 blade and deepened via blunt  dissection through subcutaneous tissues.  Care is  slowly and painstakingly  taken to get good exposure of the lacinate ligament, both the superior and  inferior boundaries.  The ligament is then released, working from a proximal-  to-distal orientation utilizing Metzenbaum scissors.  The neurovascular  bundle is appreciated and visualized, and not disturbed.  Care is taken to  get good release of all crossing fibers over the neurovascular bundle,  working from superior to inferior.  With the release complete, the wound is  flushed with copious amounts of irrigant.  Subcutaneous tissues are then  reapproximated and closed utilizing 4-0 Vicryl in a running horizontal  mattress suture, skin is closed with 4-0 Vicryl in a subcuticular sutures.  Steri-Strips are applied across the wound.  A postoperative injection of  Marcaine and Hexadrol is dispensed.  A Betadine-soaked Adaptic dressing, 2 x  2's, 4 x 4's and Kerlix dressing are then applied and an ACE bandage  utilized to secure the dressing.   [facility] Eanes [facility].  While there, a list of written instructions were orally explained to her and  her husband.  A prescription for Lortab 10/650 and Phenergan 25 mg is  dispensed.  Dr. Pricilla Holm will see her in 8 days for her first postop visit and  she is made aware of that.  She will be seen by myself in 4 days postop.  She is instructed to call if she has any concerns.  I have instructed her  that there is a possibility she may see some drainage or bleeding through  the bandage to the Ace bandage.  She is instructed on using the Cam walker,  non-weightbearing on crutches for at least the first 3 weeks.  She is  instructed to take the Cam walker off and perform ankle range of motion  exercises 4 times a day for 10 repetitions.       CMD/MEDQ  D:  02/02/2005  T:  02/02/2005  Job:  [id number]

## 2011-01-07 NOTE — H&P (Signed)
[name], [name]               ACCOUNT NO.:  [id number]   MEDICAL RECORD NO.:  [id number]          PATIENT TYPE:  AMB   LOCATION:  DAY                           FACILITY:  [facility]   PHYSICIAN:  Cody M. Ulice Brilliant, D.P.M.  DATE OF BIRTH:  April 07, 1961   DATE OF ADMISSION:  02/02/2005  DATE OF DISCHARGE:  LH                                HISTORY & PHYSICAL   HISTORY OF PRESENT ILLNESS:  Sarah [name] has pain in the medial aspect of her  right heel which has been bothering her for nine months at this point.  This  pain started approximately two years ago as classic plantar fascitis heel  spur symptoms, but about nine months ago she started developing a burning  sensation with pain developing in the heel, radiating up into the ankle and  up into the leg.  She was originally referred to me by Dr. Fuller Canada.  Dr. Romeo Apple had put her through nerve conduction studies with Dr. Beryle Beams who related impairment of the nerve conduction velocity of this  leg.  Sarah [name] has been treated conservatively with injection therapy,  posterior tibial nerve blocks, and Lyrica orally.  She relates some  improvement, but when the block seems to wear off she has pain recurring.  She has had two nerve blocks to date.   PAST MEDICAL HISTORY:  Hormone replacement therapy for which she is taking  Premarin.   ALLERGIES:  SULFA.   SOCIAL HISTORY:  She is a 1/2 pack per day smoker, occasional drinker.   PHYSICAL EXAMINATION:  She has a positive Tinel's sign about the medial  ankle.  She is tender to palpation around this medial ankle, especially  around the posterior tibial nerve.   ASSESSMENT:  Clinical symptoms and neurological testing suggest tarsal-  tunnel syndrome, right medial ankle.   PLAN:  The patient has been treated conservatively with medication orally as  well as injection therapy which has offered basically just temporary relief.  I have instructed her that I think her best bet now would  be a decompression  of the posterior tibial nerve via surgical release of the ___________  ligament overlying the medial tarsal tunnel.  I have instructed her that  this is not a 100% guarantee in improvement;  however, most patients post-  surgically do experience relief.  I have described the procedure to her, the  usual postoperative course, and the possibility of complications.  She has  read her consent form and apparently understood and signed.       CMD/MEDQ  D:  02/01/2005  T:  02/01/2005  Job:  [id number]

## 2011-02-19 ENCOUNTER — Emergency Department (HOSPITAL_COMMUNITY)
Admission: EM | Admit: 2011-02-19 | Discharge: 2011-02-20 | Disposition: A | Discharge status: Home / Self Care | Payer: BC Managed Care – PPO | Attending: Emergency Medicine | Admitting: Emergency Medicine

## 2011-02-19 DIAGNOSIS — Z79899 Other long term (current) drug therapy: Secondary | ICD-10-CM | POA: Insufficient documentation

## 2011-02-19 DIAGNOSIS — Z794 Long term (current) use of insulin: Secondary | ICD-10-CM | POA: Insufficient documentation

## 2011-02-19 DIAGNOSIS — F17200 Nicotine dependence, unspecified, uncomplicated: Secondary | ICD-10-CM | POA: Insufficient documentation

## 2011-02-19 DIAGNOSIS — R21 Rash and other nonspecific skin eruption: Secondary | ICD-10-CM | POA: Insufficient documentation

## 2011-02-19 DIAGNOSIS — F329 Major depressive disorder, single episode, unspecified: Secondary | ICD-10-CM | POA: Insufficient documentation

## 2011-02-19 DIAGNOSIS — E119 Type 2 diabetes mellitus without complications: Secondary | ICD-10-CM | POA: Insufficient documentation

## 2011-02-19 DIAGNOSIS — F3289 Other specified depressive episodes: Secondary | ICD-10-CM | POA: Insufficient documentation

## 2011-02-19 DIAGNOSIS — E785 Hyperlipidemia, unspecified: Secondary | ICD-10-CM | POA: Insufficient documentation

## 2011-02-19 DIAGNOSIS — E7800 Pure hypercholesterolemia, unspecified: Secondary | ICD-10-CM | POA: Insufficient documentation

## 2011-05-01 IMAGING — CT CT ABD-PELV W/ CM
2 of 3 series · 16 of 46 positions shown, 18 images · IV contrast (Omnipaque 300)
Comparison: 05/31/2007

CLINICAL DATA: Abdominal and pelvic pain.  Nausea vomiting and
diarrhea.

CT ABDOMEN AND PELVIS WITH CONTRAST
TECHNIQUE: Multidetector CT imaging of the abdomen and pelvis was
performed following the standard protocol during bolus
administration of intravenous contrast.
Contrast: 05/31/2007

[Series 2: abd_pel_with 5.0 b40f · axial · 0.60mm/px · z∈[-488,-98]mm · 13 of 90 slices shown, 15 images]
[im 6/90  soft-tissue]
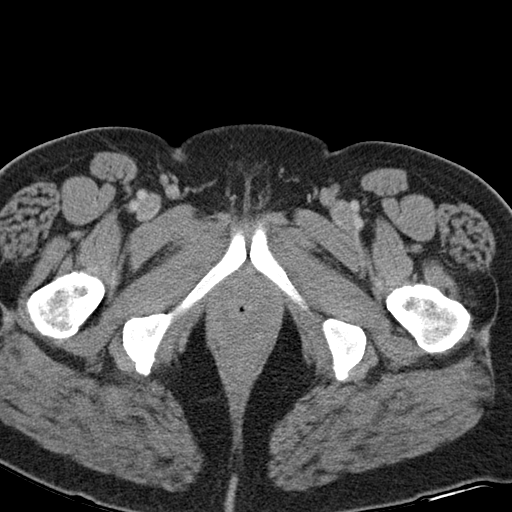
[im 6/90  bone]
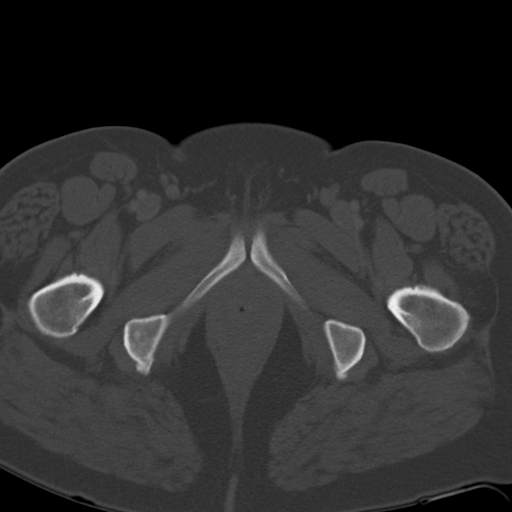
[im 12/90  soft-tissue]
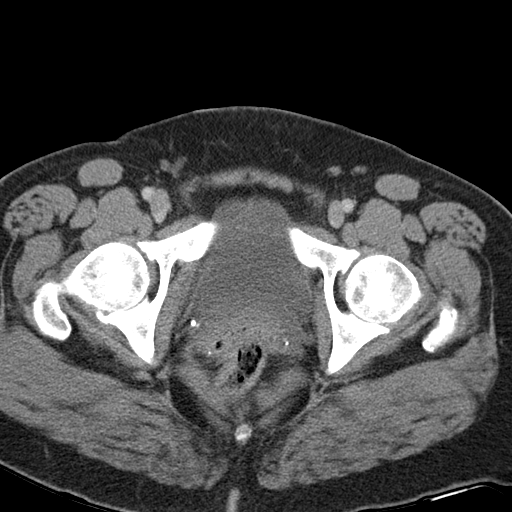
[im 18/90  soft-tissue]
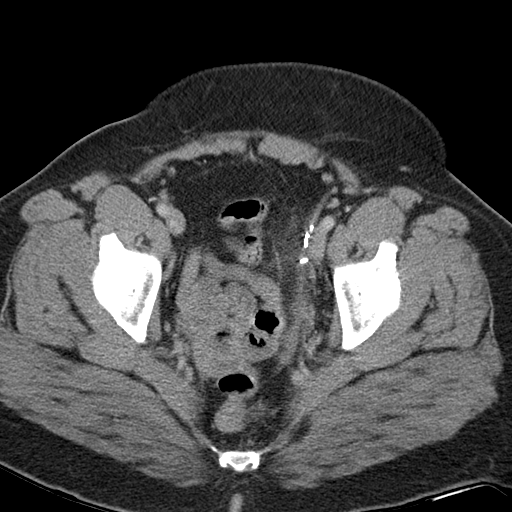
[im 26/90  soft-tissue]
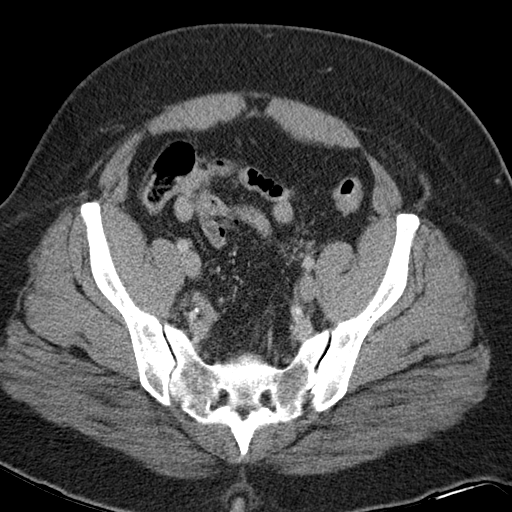
[im 32/90  soft-tissue]
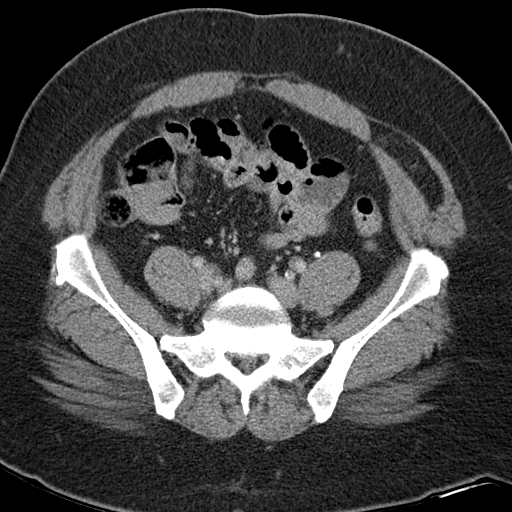
[im 38/90  soft-tissue]
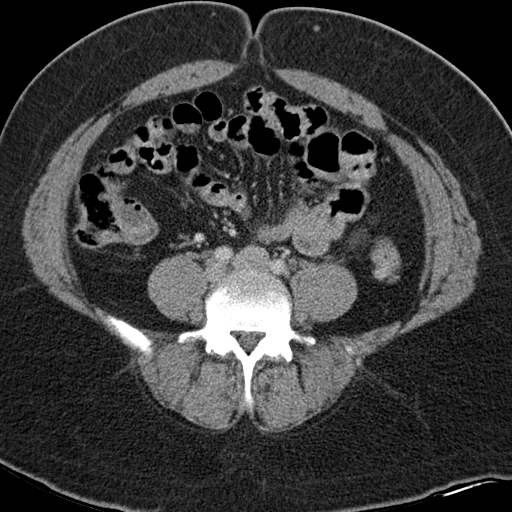
[im 46/90  soft-tissue]
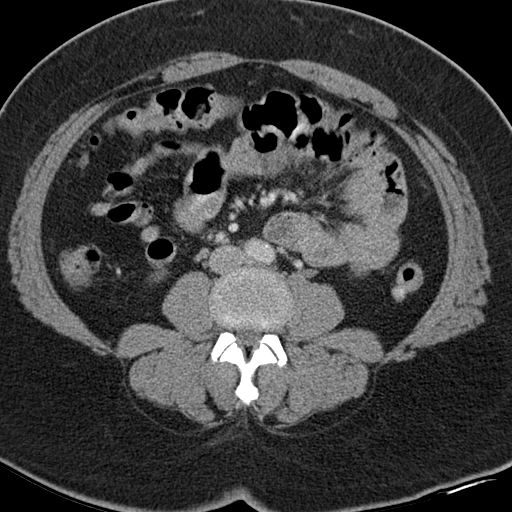
[im 52/90  soft-tissue]
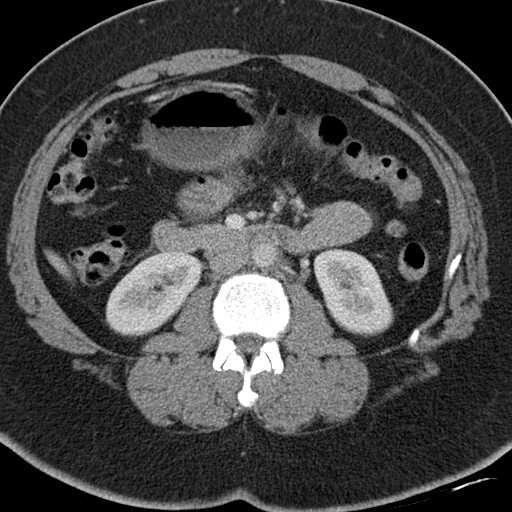
[im 58/90  soft-tissue]
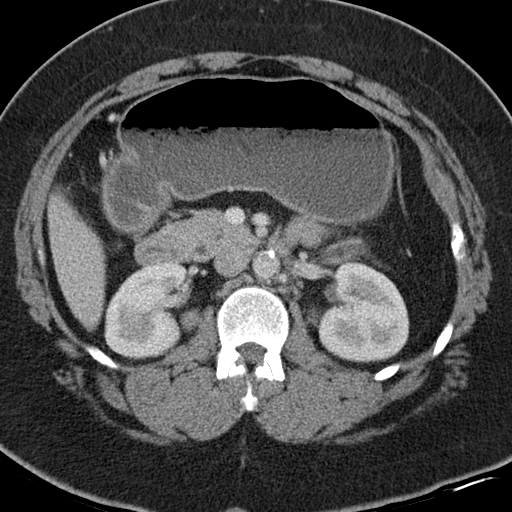
[im 58/90  bone]
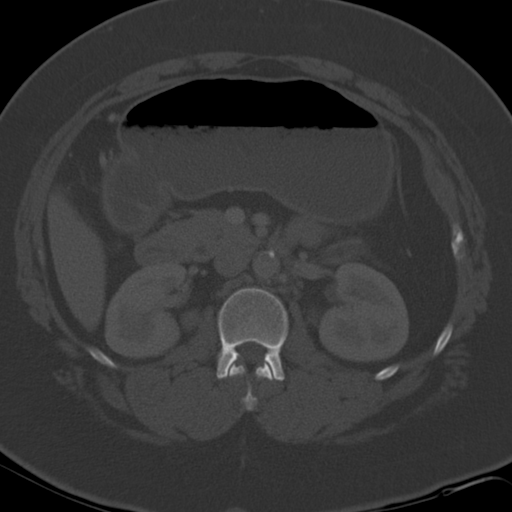
[im 64/90  soft-tissue]
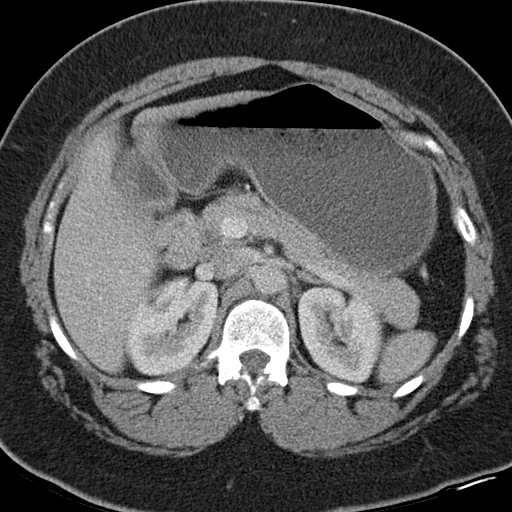
[im 72/90  soft-tissue]
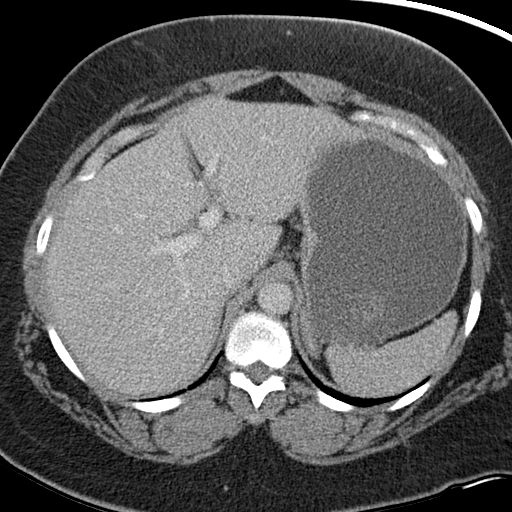
[im 78/90  soft-tissue]
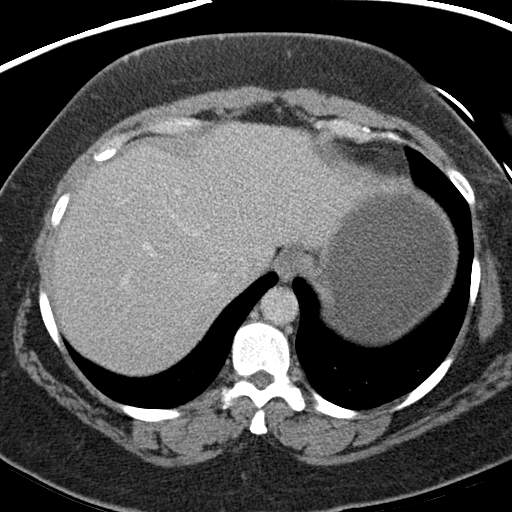
[im 84/90  soft-tissue]
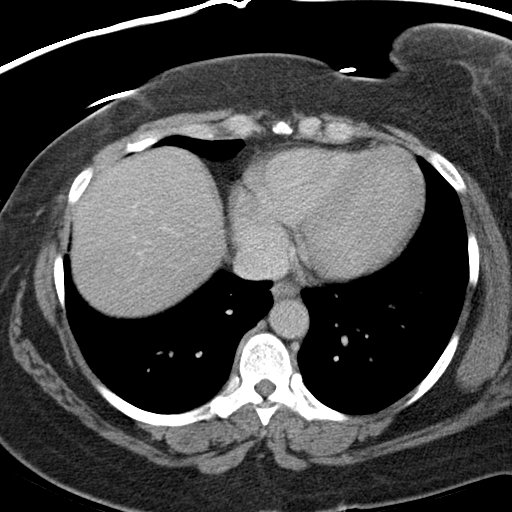

[Series 4: mpr cor post contrast (id) · coronal · 0.71mm/px · 3 of 101 slices shown]
[im 34/101  soft-tissue]
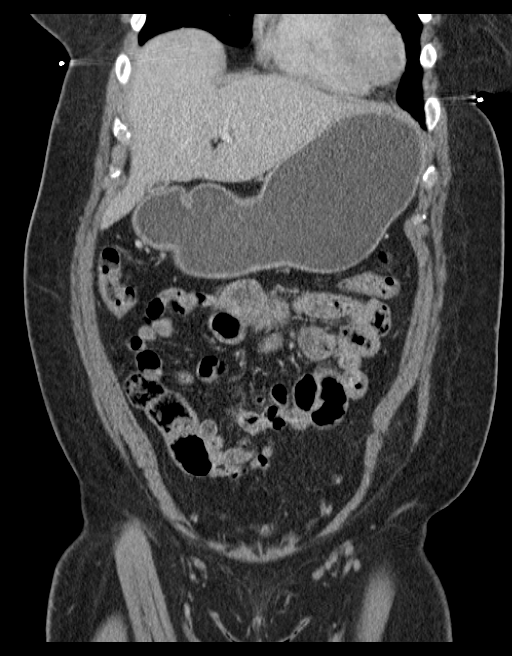
[im 45/101  soft-tissue]
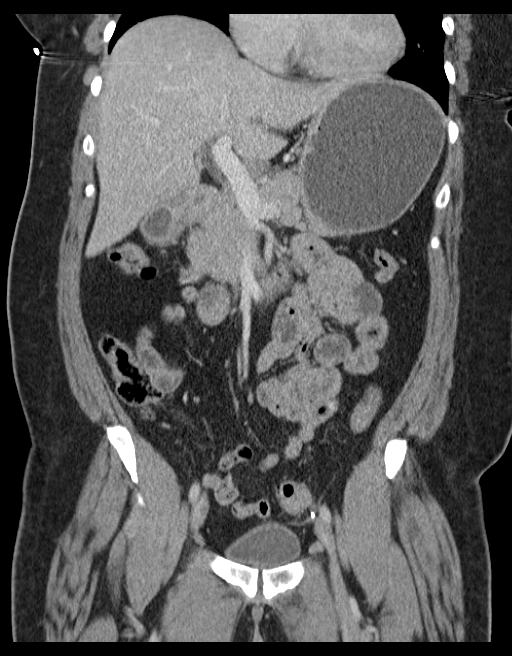
[im 56/101  soft-tissue]
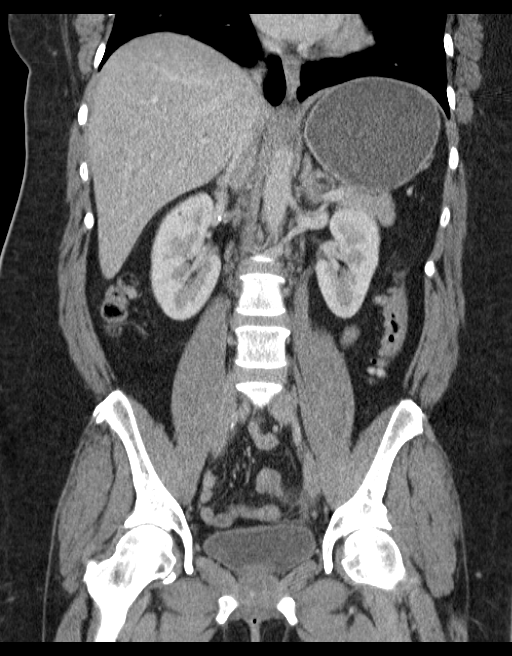

[16 of 46 positions shown; findings below may reference images not displayed]

FINDINGS: The liver, spleen, adrenal glands, gallbladder, pancreas,
and kidneys are unremarkable.
No free fluid, enlarged lymph nodes, biliary dilation or abdominal
aortic aneurysm identified.

Descending and colonic diverticulosis is again identified.
A small amount of inflammation along the proximal sigmoid colon
(image 68) is suspicious for mild acute diverticulitis.

There is no evidence of bowel obstruction, pneumoperitoneum, or
focal abscess.

A left anterior lateral pelvic/abdominal wall hernia containing fat
is unchanged (image 61).

No acute or suspicious bony abnormalities are identified.
IMPRESSION: Question mild acute diverticulitis along the proximal sigmoid
colon.  No evidence of bowel obstruction, pneumoperitoneum or focal
abscess.

Stable left anterolateral pelvic/abdominal wall hernia containing
fat.

## 2011-06-02 LAB — CBC
HCT: 39
Hemoglobin: 13.5
MCHC: 34.6
MCV: 86.4
RBC: 4.51
RDW: 13.5

## 2011-06-02 LAB — URINALYSIS, ROUTINE W REFLEX MICROSCOPIC
Bilirubin Urine: NEGATIVE
Ketones, ur: NEGATIVE
Nitrite: NEGATIVE
Urobilinogen, UA: 0.2

## 2011-06-02 LAB — BASIC METABOLIC PANEL
CO2: 26
Calcium: 8.7
Chloride: 106
GFR calc Af Amer: >60
Glucose, Bld: 108 — ABNORMAL HIGH
Potassium: 4.1
Sodium: 138

## 2011-06-02 LAB — DIFFERENTIAL
Basophils Absolute: 0
Basophils Relative: 0
Eosinophils Absolute: 0.2
Eosinophils Relative: 2
Monocytes Absolute: 0.9 — ABNORMAL HIGH
Monocytes Relative: 10

## 2011-12-12 ENCOUNTER — Encounter (HOSPITAL_COMMUNITY): Payer: Self-pay | Admitting: *Deleted

## 2011-12-12 ENCOUNTER — Emergency Department (HOSPITAL_COMMUNITY)
Admission: EM | Admit: 2011-12-12 | Discharge: 2011-12-12 | Disposition: A | Discharge status: Home / Self Care | Payer: BC Managed Care – PPO | Attending: Emergency Medicine | Admitting: Emergency Medicine

## 2011-12-12 ENCOUNTER — Emergency Department (HOSPITAL_COMMUNITY): Payer: BC Managed Care – PPO

## 2011-12-12 DIAGNOSIS — R490 Dysphonia: Secondary | ICD-10-CM | POA: Insufficient documentation

## 2011-12-12 DIAGNOSIS — R070 Pain in throat: Secondary | ICD-10-CM | POA: Insufficient documentation

## 2011-12-12 DIAGNOSIS — R059 Cough, unspecified: Secondary | ICD-10-CM | POA: Insufficient documentation

## 2011-12-12 DIAGNOSIS — R05 Cough: Secondary | ICD-10-CM | POA: Insufficient documentation

## 2011-12-12 DIAGNOSIS — J3489 Other specified disorders of nose and nasal sinuses: Secondary | ICD-10-CM | POA: Insufficient documentation

## 2011-12-12 DIAGNOSIS — J40 Bronchitis, not specified as acute or chronic: Secondary | ICD-10-CM | POA: Insufficient documentation

## 2011-12-12 DIAGNOSIS — R51 Headache: Secondary | ICD-10-CM | POA: Insufficient documentation

## 2011-12-12 DIAGNOSIS — E119 Type 2 diabetes mellitus without complications: Secondary | ICD-10-CM | POA: Insufficient documentation

## 2011-12-12 DIAGNOSIS — Z79899 Other long term (current) drug therapy: Secondary | ICD-10-CM | POA: Insufficient documentation

## 2011-12-12 DIAGNOSIS — R071 Chest pain on breathing: Secondary | ICD-10-CM | POA: Insufficient documentation

## 2011-12-12 DIAGNOSIS — R220 Localized swelling, mass and lump, head: Secondary | ICD-10-CM | POA: Insufficient documentation

## 2011-12-12 MED ORDER — BENZONATATE 100 MG PO CAPS: 200.0000 mg | ORAL_CAPSULE | Freq: Three times a day (TID) | ORAL | Status: DC

## 2011-12-12 MED ORDER — BENZONATATE 100 MG PO CAPS: 200.0000 mg | ORAL_CAPSULE | Freq: Three times a day (TID) | ORAL | Status: AC

## 2011-12-12 MED ORDER — ACETAMINOPHEN 325 MG PO TABS
650.0000 mg | ORAL_TABLET | Freq: Once | ORAL | Status: AC
  Administered 2011-12-12: 650 mg via ORAL
  Filled 2011-12-12: qty 2

## 2011-12-12 MED ORDER — HYDROCODONE-ACETAMINOPHEN 5-325 MG PO TABS: 1.0000 | ORAL_TABLET | ORAL | Status: AC | PRN

## 2011-12-12 MED ORDER — HYDROCODONE-ACETAMINOPHEN 5-325 MG PO TABS
1.0000 | ORAL_TABLET | ORAL | Status: DC | PRN
Start: 2011-12-12 — End: 2011-12-12

## 2011-12-12 MED ORDER — BENZONATATE 100 MG PO CAPS
200.0000 mg | ORAL_CAPSULE | Freq: Once | ORAL | Status: AC
  Administered 2011-12-12: 200 mg via ORAL
  Filled 2011-12-12: qty 2

## 2011-12-12 NOTE — ED Notes (Signed)
Hoarse, cough,no fever or chills

## 2011-12-12 NOTE — ED Provider Notes (Signed)
History     CSN: [id number]  Arrival date & time 12/12/11  4098   First MD Initiated Contact with Patient 12/12/11 1932      Chief Complaint  Patient presents with  . Cough    (Consider location/radiation/quality/duration/timing/severity/associated sxs/prior treatment) HPI Comments: Patient presents with a 24-hour history of nonproductive cough, nasal congestion and clear rhinorrhea and increasing sore throat and voice hoarseness along with midsternal and left chest wall pain which is triggered by coughing.  Cough occurs every few minutes.  Patient is a 51 y.o. female presenting with cough. The history is provided by the patient.  Cough The problem has not changed since onset.There has been no fever. Associated symptoms include chest pain, chills, headaches and rhinorrhea. Pertinent negatives include no sweats, no myalgias, no shortness of breath and no wheezing. Treatments tried: She has tried OTC cough syrup with no relief. She is not a smoker.    Past Medical History  Diagnosis Date  . Diabetes mellitus     Past Surgical History  Procedure Date  . Abdominal hysterectomy   . Colon surgery   . Foot surgery     History reviewed. No pertinent family history.  History  Substance Use Topics  . Smoking status: Never Smoker   . Smokeless tobacco: Not on file  . Alcohol Use: Yes    OB History    Grav Para Term Preterm Abortions TAB SAB Ect Mult Living                  Review of Systems  Constitutional: Positive for chills. Negative for fever.  HENT: Positive for congestion, rhinorrhea and voice change. Negative for hearing loss, neck pain and neck stiffness.   Eyes: Negative.   Respiratory: Positive for cough. Negative for choking, chest tightness, shortness of breath, wheezing and stridor.   Cardiovascular: Positive for chest pain. Negative for palpitations and leg swelling.  Gastrointestinal: Negative for nausea and abdominal pain.  Genitourinary: Negative.     Musculoskeletal: Negative for myalgias, joint swelling and arthralgias.  Skin: Negative.  Negative for rash and wound.  Neurological: Positive for headaches. Negative for dizziness, weakness, light-headedness and numbness.  Hematological: Negative.   Psychiatric/Behavioral: Negative.     Allergies  Sulfonamide derivatives  Home Medications   Current Outpatient Rx  Name Route Sig Dispense Refill  . BENZONATATE 100 MG PO CAPS Oral Take 2 capsules (200 mg total) by mouth every 8 (eight) hours. 21 capsule 0  . HYDROCODONE-ACETAMINOPHEN 5-325 MG PO TABS Oral Take 1 tablet by mouth every 4 (four) hours as needed for pain. 6 tablet 0    PRE PACK  . HYDROCODONE-ACETAMINOPHEN 5-325 MG PO TABS Oral Take 1 tablet by mouth every 4 (four) hours as needed for pain. 15 tablet 0    BP 160/82  Pulse 87  Temp(Src) 98.2 F (36.8 C) (Oral)  Resp 16  Ht 5' 3.5" (1.613 m)  Wt 196 lb (88.905 kg)  BMI 34.18 kg/m2  SpO2 98%  Physical Exam  Nursing note and vitals reviewed. Constitutional: She appears well-developed and well-nourished.  HENT:  Head: Normocephalic and atraumatic.  Right Ear: Tympanic membrane normal.  Left Ear: Tympanic membrane normal.  Nose: Mucosal edema and rhinorrhea present.  Mouth/Throat: Uvula is midline and oropharynx is clear and moist. No oropharyngeal exudate, posterior oropharyngeal edema, posterior oropharyngeal erythema or tonsillar abscesses.       Voice is very raspy and hoarse.  Eyes: Conjunctivae are normal.  Neck: Normal range of motion.  Cardiovascular: Normal rate, regular rhythm, normal heart sounds and intact distal pulses.   Pulmonary/Chest: Effort normal and breath sounds normal. No stridor. No respiratory distress. She has no wheezes. She has no rales. She exhibits tenderness.  Abdominal: Soft. Bowel sounds are normal. There is no tenderness.  Musculoskeletal: Normal range of motion.  Lymphadenopathy:    She has no cervical adenopathy.  Neurological:  She is alert.  Skin: Skin is warm and dry.  Psychiatric: She has a normal mood and affect.    ED Course  Procedures (including critical care time)  Labs Reviewed - No data to display Dg Chest 2 View  12/12/2011  *RADIOLOGY REPORT*  Clinical Data: Cough  CHEST - 2 VIEW  Comparison: 12/10/2010  Findings: Heart size upper limits normal.  Lungs are clear.  No effusion.  Regional bones unremarkable.  IMPRESSION:  No acute disease.  Original Report Authenticated By: Osa Craver, M.D.     1. Bronchitis       MDM  X-ray reviewed prior to discharge home.  Patient was prescribed Tessalon and also hydrocodone for nighttime pain and cough relief.  Patient's chest pain is reproducible with palpation and with coughing and patient has no respiratory distress.  She has no risk factors for DVT or PE.  Vital signs are stable.  Encouraged rest, fluids.  Recheck by PCP if not improving over the next 4-5 days.        Candis Musa, PA 12/12/11 2102

## 2011-12-12 NOTE — Discharge Instructions (Signed)
Bronchitis Bronchitis is the body's way of reacting to injury and/or infection (inflammation) of the bronchi. Bronchi are the air tubes that extend from the windpipe into the lungs. If the inflammation becomes severe, it may cause shortness of breath. CAUSES  Inflammation may be caused by:  A virus.   Germs (bacteria).   Dust.   Allergens.   Pollutants and many other irritants.  The cells lining the bronchial tree are covered with tiny hairs (cilia). These constantly beat upward, away from the lungs, toward the mouth. This keeps the lungs free of pollutants. When these cells become too irritated and are unable to do their job, mucus begins to develop. This causes the characteristic cough of bronchitis. The cough clears the lungs when the cilia are unable to do their job. Without either of these protective mechanisms, the mucus would settle in the lungs. Then you would develop pneumonia. Smoking is a common cause of bronchitis and can contribute to pneumonia. Stopping this habit is the single most important thing you can do to help yourself. TREATMENT   Your caregiver may prescribe an antibiotic if the cough is caused by bacteria. Also, medicines that open up your airways make it easier to breathe. Your caregiver may also recommend or prescribe an expectorant. It will loosen the mucus to be coughed up. Only take over-the-counter or prescription medicines for pain, discomfort, or fever as directed by your caregiver.   Removing whatever causes the problem (smoking, for example) is critical to preventing the problem from getting worse.   Cough suppressants may be prescribed for relief of cough symptoms.   Inhaled medicines may be prescribed to help with symptoms now and to help prevent problems from returning.   For those with recurrent (chronic) bronchitis, there may be a need for steroid medicines.  SEEK IMMEDIATE MEDICAL CARE IF:   During treatment, you develop more pus-like mucus  (purulent sputum).   You have a fever.   Your baby is older than 3 months with a rectal temperature of 102 F (38.9 C) or higher.   Your baby is 5 months old or younger with a rectal temperature of 100.4 F (38 C) or higher.   You become progressively more ill.   You have increased difficulty breathing, wheezing, or shortness of breath.  It is necessary to seek immediate medical care if you are elderly or sick from any other disease. MAKE SURE YOU:   Understand these instructions.   Will watch your condition.   Will get help right away if you are not doing well or get worse.  Document Released: 08/08/2005 Document Revised: 07/28/2011 Document Reviewed: 06/17/2008 [facility] Patient Information 2012 [place], [place].  Use the medications as prescribed for your cough.  Hydrocodone will make you drowsy do not drive within 4 hours of taking.  He may use Tessalon during the day when you need to be awake and alert.  Rest, make sure you are drinking plenty of fluids.  Resting her voice will help heal your voice.  Get rechecked if not improving over the next several days.

## 2011-12-17 NOTE — ED Provider Notes (Signed)
Medical screening examination/treatment/procedure(s) were performed by non-physician practitioner and as supervising physician I was immediately available for consultation/collaboration.   Shelda Jakes, MD 12/17/11 2109

## 2012-03-22 ENCOUNTER — Encounter: Payer: Self-pay | Admitting: Internal Medicine

## 2012-03-22 LAB — CBC WITH DIFFERENTIAL/PLATELET
HCT: 40 %
platelet count: 355

## 2012-03-23 ENCOUNTER — Ambulatory Visit (INDEPENDENT_AMBULATORY_CARE_PROVIDER_SITE_OTHER): Payer: BC Managed Care – PPO | Admitting: Urgent Care

## 2012-03-23 ENCOUNTER — Encounter: Payer: Self-pay | Admitting: Urgent Care

## 2012-03-23 VITALS — BP 126/73 | HR 107 | Temp 98.1°F | Ht 63.0 in | Wt 199.2 lb

## 2012-03-23 DIAGNOSIS — K625 Hemorrhage of anus and rectum: Secondary | ICD-10-CM | POA: Insufficient documentation

## 2012-03-23 NOTE — Progress Notes (Signed)
Primary Care Physician:  Avon Gully, MD Primary Gastroenterologist:  Dr. Jena Gauss  Chief Complaint  Patient presents with  . Rectal Bleeding    HPI:  Sarah [name] is a 51 y.o. female here for evaluation of hematochezia.  She tells me last week she was eating cherries & noticed red stuff in her stools & thought it was due to them.  She continued to have bleeding.  This week she has had 2 episodes of bright red bleeding after BM w/ wiping on tissue w/ small clot, followed by an entire stool of large amount of blood.  C/o pain @ LLQ worse after defecation.  S/p lap sigmoid colectomy 02/01/10 by Dr Lovell Sheehan for recurrent diverticulitis.  She denies asa or nsaids.  BM 2 per day.   Denies any constipation, diarrhea, or weight loss.  She has been having some acid reflux.  C/o heartburn & started OTC equate acid reducer prn couple per week over the past year.  Denies anorexia, weight loss, nausea, vomiting, dysphagia or odynophagia.  Hgb 13.3 on 03/22/12.    Past Medical History  Diagnosis Date  . Diabetes mellitus   . Diverticulosis     Past Surgical History  Procedure Date  . Abdominal hysterectomy     complete  . Foot surgery   . Colonoscopy 01/04/2010    left sided transverse diverticula/two diminutive rectal polyps  . Laparoscopic sigmoid colectomy 02/01/10    Dr Clydene Pugh    Current Outpatient Prescriptions  Medication Sig Dispense Refill  . amoxicillin-clavulanate (AUGMENTIN) 875-125 MG per tablet Take 1 tablet by mouth 2 (two) times daily.       . diphenhydrAMINE (BENADRYL) 25 MG tablet Take 25 mg by mouth at bedtime.      Marland Kitchen estrogen-methylTESTOSTERone (ESTRATEST) 1.25-2.5 MG per tablet Take 1 tablet by mouth daily.      . insulin aspart (NOVOLOG FLEXPEN) 100 UNIT/ML injection Inject 10-15 Units into the skin 3 (three) times daily before meals. As directed per sliding scale      . INVOKANA 100 MG TABS Take 100 mg by mouth daily.       Marland Kitchen LANTUS SOLOSTAR 100 UNIT/ML  injection Inject 40 Units into the skin at bedtime.       . metFORMIN (GLUCOPHAGE) 500 MG tablet Take 500 mg by mouth 2 (two) times daily with a meal.      . pantoprazole (PROTONIX) 40 MG tablet Take 40 mg by mouth daily.       . simvastatin (ZOCOR) 20 MG tablet Take 20 mg by mouth daily.         Allergies as of 03/23/2012 - Review Complete 03/23/2012  Allergen Reaction Noted  . Sulfonamide derivatives Itching   . Latex Itching and Rash 12/12/2011    Family History  Problem Relation Age of Onset  . Colon cancer Maternal Grandfather   . Colon polyps Mother     History   Social History  . Marital Status: Married    Spouse Name: N/A    Number of Children: 1  . Years of Education: N/A   Occupational History  . K teacher asst    Social History Main Topics  . Smoking status: Never Smoker   . Smokeless tobacco: Not on file  . Alcohol Use: Yes     2/per yr  . Drug Use: No  . Sexually Active: Yes    Birth Control/ Protection: Surgical   Other Topics Concern  . Not on file   Social History Narrative  Lives w/ husband, sister & her children , teen mom & her baby    Review of Systems: Gen: Denies any fever, chills, sweats, anorexia, fatigue, weakness, malaise, weight loss, and sleep disorder CV: Denies chest pain, angina, palpitations, syncope, orthopnea, PND, peripheral edema, and claudication. Resp: Denies dyspnea at rest, dyspnea with exercise, cough, sputum, wheezing, coughing up blood, and pleurisy. GI: Denies vomiting blood, jaundice, and fecal incontinence.   Denies dysphagia or odynophagia. GU : Denies urinary burning, blood in urine, urinary frequency, urinary hesitancy, nocturnal urination, and urinary incontinence. MS: Denies joint pain, limitation of movement, and swelling, stiffness, low back pain, extremity pain. Denies muscle weakness, cramps, atrophy.  Derm: Denies rash, itching, dry skin, hives, moles, warts, or unhealing ulcers.  Psych: Denies depression,  anxiety, memory loss, suicidal ideation, hallucinations, paranoia, and confusion. Heme: Denies bruising, bleeding, and enlarged lymph nodes. Neuro:  Denies any headaches, dizziness, paresthesias. Endo:  Denies any problems with DM, thyroid, adrenal function.  Physical Exam: BP 126/73  Pulse 107  Temp 98.1 F (36.7 C) (Temporal)  Ht 5\' 3"  (1.6 m)  Wt 199 lb 3.2 oz (90.357 kg)  BMI 35.29 kg/m2 No LMP recorded. Patient has had a hysterectomy. General:   Alert,  Well-developed, well-nourished, pleasant and cooperative in NAD Head:  Normocephalic and atraumatic. Eyes:  Sclera clear, no icterus.   Conjunctiva pink. Ears:  Normal auditory acuity. Nose:  No deformity, discharge, or lesions. Mouth:  No deformity or lesions,oropharynx pink & moist. Neck:  Supple; no masses or thyromegaly. Lungs:  Clear throughout to auscultation.   No wheezes, crackles, or rhonchi. No acute distress. Heart:  Regular rate and rhythm; no murmurs, clicks, rubs,  or gallops. Abdomen:  Normal bowel sounds.  No bruits.  Soft, non-tender and non-distended without masses, hepatosplenomegaly or hernias noted.  No guarding or rebound tenderness.   Rectal:  No significant internal or external lesions visualized.  Light brown stool heme positive.  Msk:  Symmetrical without gross deformities. Normal posture. Pulses:  Normal pulses noted. Extremities:  No clubbing or edema. Neurologic:  Alert and  oriented x4;  grossly normal neurologically. Skin:  Intact without significant lesions or rashes. Lymph Nodes:  No significant cervical adenopathy. Psych:  Alert and cooperative. Normal mood and affect.

## 2012-03-23 NOTE — Patient Instructions (Addendum)
Colonoscopy w/ Dr Jena Gauss Early morning appointment-hold all diabetes medications day of procedure Bring all your medications and/or any insulin to the hospital the day of the procedure. Hold your slide scale insulin the night before your procedure and morning of your procedure Hold invokana day before and day of your procedure Take half usual dose of Lantus (20 units) the night before your procedure Hold evening dose of metformin the night before your procedure Follow blood sugars, call us or your PCP if any problems. To ER if severe bleeding  Rectal Bleeding Rectal bleeding is when blood passes out of the anus. It is usually a sign that something is wrong. It may not be serious, but it should always be evaluated. Rectal bleeding may present as bright red blood or extremely dark stools. The color may range from dark red or maroon to black (like tar). It is important that the cause of rectal bleeding be identified so treatment can be started and the problem corrected. CAUSES   Hemorrhoids. These are enlarged (dilated) blood vessels or veins in the anal or rectal area.   Fistulas. Theseare abnormal, burrowing channels that usually run from inside the rectum to the skin around the anus. They can bleed.   Anal fissures. This is a tear in the tissue of the anus. Bleeding occurs with bowel movements.   Diverticulosis. This is a condition in which pockets or sacs project from the bowel wall. Occasionally, the sacs can bleed.   Diverticulitis. Thisis an infection involving diverticulosis of the colon.   Proctitis and colitis. These are conditions in which the rectum, colon, or both, can become inflamed and pitted (ulcerated).   Polyps and cancer. Polyps are non-cancerous (benign) growths in the colon that may bleed. Certain types of polyps turn into cancer.   Protrusion of the rectum. Part of the rectum can project from the anus and bleed.   Certain medicines.   Intestinal infections.    Blood vessel abnormalities.  HOME CARE INSTRUCTIONS  Eat a high-fiber diet to keep your stool soft.   Limit activity.   Drink enough fluids to keep your urine clear or pale yellow.   Warm baths may be useful to soothe rectal pain.   Follow up with your caregiver as directed.  SEEK IMMEDIATE MEDICAL CARE IF:  You develop increased bleeding.   You have black or dark red stools.   You vomit blood or material that looks like coffee grounds.   You have abdominal pain or tenderness.   You have a fever.   You feel weak, nauseous, or you faint.   You have severe rectal pain or you are unable to have a bowel movement.  MAKE SURE YOU:  Understand these instructions.   Will watch your condition.   Will get help right away if you are not doing well or get worse.  Document Released: 01/28/2002 Document Revised: 07/28/2011 Document Reviewed: 01/23/2011 [facility] Patient Information 2012 [place], [place].

## 2012-03-24 ENCOUNTER — Encounter: Payer: Self-pay | Admitting: Urgent Care

## 2012-03-24 NOTE — Assessment & Plan Note (Addendum)
Sarah [name] is a pleasant 51 y.o. female with 2 week history of hematochezia.  Hgb normal.  She is s/p partial colectomy for diverticulitis. No source of bleeding identified on rectal exam, but does have heme positive stool.  Differentials include internal hemorrhoids not appreciated on exam, friable anastomosis, ischemia, less likely colorectal carcinoma.  Pt is quite concerned something is really wrong & is encouraging further work-up to locate exact source of bleeding & this seems reasonable given her last colonoscopy was over 2 years ago & she has had surgery in the interim. Colonoscopy in near future w/ Dr Jena Gauss.  I have discussed risks & benefits which include, but are not limited to, bleeding, infection, perforation & drug reaction.  The patient agrees with this plan & written consent will be obtained.

## 2012-03-26 ENCOUNTER — Other Ambulatory Visit: Payer: Self-pay | Admitting: Internal Medicine

## 2012-03-26 ENCOUNTER — Encounter (HOSPITAL_COMMUNITY): Payer: Self-pay | Admitting: Pharmacy Technician

## 2012-03-26 DIAGNOSIS — K5792 Diverticulitis of intestine, part unspecified, without perforation or abscess without bleeding: Secondary | ICD-10-CM

## 2012-03-26 DIAGNOSIS — K625 Hemorrhage of anus and rectum: Secondary | ICD-10-CM

## 2012-03-26 MED ORDER — PEG-KCL-NACL-NASULF-NA ASC-C 100 G PO SOLR: 1.0000 | Freq: Once | ORAL | Status: DC

## 2012-03-26 NOTE — Progress Notes (Signed)
Faxed to PCP

## 2012-03-27 MED ORDER — SODIUM CHLORIDE 0.45 % IV SOLN
Freq: Once | INTRAVENOUS | Status: AC
  Administered 2012-03-28: 07:00:00 via INTRAVENOUS

## 2012-03-28 ENCOUNTER — Encounter (HOSPITAL_COMMUNITY)
Admission: RE | Disposition: A | Discharge status: Home / Self Care | Payer: Self-pay | Source: Ambulatory Visit | Attending: Internal Medicine

## 2012-03-28 ENCOUNTER — Encounter (HOSPITAL_COMMUNITY): Payer: Self-pay

## 2012-03-28 ENCOUNTER — Ambulatory Visit (HOSPITAL_COMMUNITY)
Admission: RE | Admit: 2012-03-28 | Discharge: 2012-03-28 | Disposition: A | Discharge status: Home / Self Care | Payer: BC Managed Care – PPO | Source: Ambulatory Visit | Attending: Internal Medicine | Admitting: Internal Medicine

## 2012-03-28 DIAGNOSIS — K921 Melena: Secondary | ICD-10-CM | POA: Insufficient documentation

## 2012-03-28 DIAGNOSIS — D126 Benign neoplasm of colon, unspecified: Secondary | ICD-10-CM | POA: Insufficient documentation

## 2012-03-28 DIAGNOSIS — K5730 Diverticulosis of large intestine without perforation or abscess without bleeding: Secondary | ICD-10-CM | POA: Insufficient documentation

## 2012-03-28 DIAGNOSIS — Z01812 Encounter for preprocedural laboratory examination: Secondary | ICD-10-CM | POA: Insufficient documentation

## 2012-03-28 DIAGNOSIS — K625 Hemorrhage of anus and rectum: Secondary | ICD-10-CM

## 2012-03-28 DIAGNOSIS — E119 Type 2 diabetes mellitus without complications: Secondary | ICD-10-CM | POA: Insufficient documentation

## 2012-03-28 DIAGNOSIS — Z794 Long term (current) use of insulin: Secondary | ICD-10-CM | POA: Insufficient documentation

## 2012-03-28 DIAGNOSIS — K5792 Diverticulitis of intestine, part unspecified, without perforation or abscess without bleeding: Secondary | ICD-10-CM

## 2012-03-28 HISTORY — DX: Gastro-esophageal reflux disease without esophagitis: K21.9

## 2012-03-28 HISTORY — DX: Pure hypercholesterolemia, unspecified: E78.00

## 2012-03-28 HISTORY — DX: Anxiety disorder, unspecified: F41.9

## 2012-03-28 HISTORY — PX: COLONOSCOPY: SHX5424

## 2012-03-28 SURGERY — COLONOSCOPY
Anesthesia: Moderate Sedation

## 2012-03-28 MED ORDER — MEPERIDINE HCL 100 MG/ML IJ SOLN
INTRAMUSCULAR | Status: AC
  Filled 2012-03-28: qty 2

## 2012-03-28 MED ORDER — MIDAZOLAM HCL 5 MG/5ML IJ SOLN
INTRAMUSCULAR | Status: AC
  Filled 2012-03-28: qty 10

## 2012-03-28 MED ORDER — STERILE WATER FOR IRRIGATION IR SOLN
Status: DC | PRN
  Administered 2012-03-28: 07:00:00

## 2012-03-28 MED ORDER — MIDAZOLAM HCL 5 MG/5ML IJ SOLN
INTRAMUSCULAR | Status: DC | PRN
  Administered 2012-03-28 (×2): 2 mg via INTRAVENOUS

## 2012-03-28 MED ORDER — MEPERIDINE HCL 100 MG/ML IJ SOLN
INTRAMUSCULAR | Status: DC | PRN
  Administered 2012-03-28: 25 mg via INTRAVENOUS
  Administered 2012-03-28: 50 mg via INTRAVENOUS

## 2012-03-28 NOTE — H&P (View-Only) (Signed)
Primary Care Physician:  FANTA,TESFAYE, MD Primary Gastroenterologist:  Dr. Rourk  Chief Complaint  Patient presents with  . Rectal Bleeding    HPI:  Sarah [name] is a 51 y.o. female here for evaluation of hematochezia.  She tells me last week she was eating cherries & noticed red stuff in her stools & thought it was due to them.  She continued to have bleeding.  This week she has had 2 episodes of bright red bleeding after BM w/ wiping on tissue w/ small clot, followed by an entire stool of large amount of blood.  C/o pain @ LLQ worse after defecation.  S/p lap sigmoid colectomy 02/01/10 by Dr Jenkins for recurrent diverticulitis.  She denies asa or nsaids.  BM 2 per day.   Denies any constipation, diarrhea, or weight loss.  She has been having some acid reflux.  C/o heartburn & started OTC equate acid reducer prn couple per week over the past year.  Denies anorexia, weight loss, nausea, vomiting, dysphagia or odynophagia.  Hgb 13.3 on 03/22/12.    Past Medical History  Diagnosis Date  . Diabetes mellitus   . Diverticulosis     Past Surgical History  Procedure Date  . Abdominal hysterectomy     complete  . Foot surgery   . Colonoscopy 01/04/2010    left sided transverse diverticula/two diminutive rectal polyps  . Laparoscopic sigmoid colectomy 02/01/10    Dr Jenkins-diverticulitis    Current Outpatient Prescriptions  Medication Sig Dispense Refill  . amoxicillin-clavulanate (AUGMENTIN) 875-125 MG per tablet Take 1 tablet by mouth 2 (two) times daily.       . diphenhydrAMINE (BENADRYL) 25 MG tablet Take 25 mg by mouth at bedtime.      . estrogen-methylTESTOSTERone (ESTRATEST) 1.25-2.5 MG per tablet Take 1 tablet by mouth daily.      . insulin aspart (NOVOLOG FLEXPEN) 100 UNIT/ML injection Inject 10-15 Units into the skin 3 (three) times daily before meals. As directed per sliding scale      . INVOKANA 100 MG TABS Take 100 mg by mouth daily.       . LANTUS SOLOSTAR 100 UNIT/ML  injection Inject 40 Units into the skin at bedtime.       . metFORMIN (GLUCOPHAGE) 500 MG tablet Take 500 mg by mouth 2 (two) times daily with a meal.      . pantoprazole (PROTONIX) 40 MG tablet Take 40 mg by mouth daily.       . simvastatin (ZOCOR) 20 MG tablet Take 20 mg by mouth daily.         Allergies as of 03/23/2012 - Review Complete 03/23/2012  Allergen Reaction Noted  . Sulfonamide derivatives Itching   . Latex Itching and Rash 12/12/2011    Family History  Problem Relation Age of Onset  . Colon cancer Maternal Grandfather   . Colon polyps Mother     History   Social History  . Marital Status: Married    Spouse Name: N/A    Number of Children: 1  . Years of Education: N/A   Occupational History  . K teacher asst    Social History Main Topics  . Smoking status: Never Smoker   . Smokeless tobacco: Not on file  . Alcohol Use: Yes     2/per yr  . Drug Use: No  . Sexually Active: Yes    Birth Control/ Protection: Surgical   Other Topics Concern  . Not on file   Social History Narrative     Lives w/ husband, sister & her children , teen mom & her baby    Review of Systems: Gen: Denies any fever, chills, sweats, anorexia, fatigue, weakness, malaise, weight loss, and sleep disorder CV: Denies chest pain, angina, palpitations, syncope, orthopnea, PND, peripheral edema, and claudication. Resp: Denies dyspnea at rest, dyspnea with exercise, cough, sputum, wheezing, coughing up blood, and pleurisy. GI: Denies vomiting blood, jaundice, and fecal incontinence.   Denies dysphagia or odynophagia. GU : Denies urinary burning, blood in urine, urinary frequency, urinary hesitancy, nocturnal urination, and urinary incontinence. MS: Denies joint pain, limitation of movement, and swelling, stiffness, low back pain, extremity pain. Denies muscle weakness, cramps, atrophy.  Derm: Denies rash, itching, dry skin, hives, moles, warts, or unhealing ulcers.  Psych: Denies depression,  anxiety, memory loss, suicidal ideation, hallucinations, paranoia, and confusion. Heme: Denies bruising, bleeding, and enlarged lymph nodes. Neuro:  Denies any headaches, dizziness, paresthesias. Endo:  Denies any problems with DM, thyroid, adrenal function.  Physical Exam: BP 126/73  Pulse 107  Temp 98.1 F (36.7 C) (Temporal)  Ht 5' 3" (1.6 m)  Wt 199 lb 3.2 oz (90.357 kg)  BMI 35.29 kg/m2 No LMP recorded. Patient has had a hysterectomy. General:   Alert,  Well-developed, well-nourished, pleasant and cooperative in NAD Head:  Normocephalic and atraumatic. Eyes:  Sclera clear, no icterus.   Conjunctiva pink. Ears:  Normal auditory acuity. Nose:  No deformity, discharge, or lesions. Mouth:  No deformity or lesions,oropharynx pink & moist. Neck:  Supple; no masses or thyromegaly. Lungs:  Clear throughout to auscultation.   No wheezes, crackles, or rhonchi. No acute distress. Heart:  Regular rate and rhythm; no murmurs, clicks, rubs,  or gallops. Abdomen:  Normal bowel sounds.  No bruits.  Soft, non-tender and non-distended without masses, hepatosplenomegaly or hernias noted.  No guarding or rebound tenderness.   Rectal:  No significant internal or external lesions visualized.  Light brown stool heme positive.  Msk:  Symmetrical without gross deformities. Normal posture. Pulses:  Normal pulses noted. Extremities:  No clubbing or edema. Neurologic:  Alert and  oriented x4;  grossly normal neurologically. Skin:  Intact without significant lesions or rashes. Lymph Nodes:  No significant cervical adenopathy. Psych:  Alert and cooperative. Normal mood and affect.   

## 2012-03-28 NOTE — Interval H&P Note (Signed)
History and Physical Interval Note:  03/28/2012 7:35 AM  Sarah [name]  has presented today for surgery, with the diagnosis of RECTAL BLEED AND HISTORY OF DIVERTICULITIS  The various methods of treatment have been discussed with the patient and family. After consideration of risks, benefits and other options for treatment, the patient has consented to  Procedure(s) (LRB): COLONOSCOPY (N/A) as a surgical intervention .  The patient's history has been reviewed, patient examined, no change in status, stable for surgery.  I have reviewed the patient's chart and labs.  Questions were answered to the patient's satisfaction.     Eula Listen

## 2012-03-28 NOTE — Op Note (Signed)
[facility] [address] [place], [place]  [address]  COLONOSCOPY PROCEDURE REPORT  PATIENT:  Sarah, [name]  MR#:  [id number] BIRTHDATE:  August 12, 1961, 51 yrs. old  GENDER:  female ENDOSCOPIST:  R. Roetta Sessions, MD FACP [facility] REF. BY:  Glenice Laine, M.D. PROCEDURE DATE:  03/28/2012 PROCEDURE:  Ileocolonoscopy with biopsy  INDICATIONS:  Hematochezia; normal hemoglobin  INFORMED CONSENT:  The risks, benefits, alternatives and imponderables including but not limited to bleeding, perforation as well as the possibility of a missed lesion have been reviewed. The potential for biopsy, lesion removal, etc. have also been discussed.  Questions have been answered.  All parties agreeable. Please see the history and physical in the medical record for more information.  MEDICATIONS:  Versed 4 mg IV Demerol 75 mg IV in divided doses.  DESCRIPTION OF PROCEDURE:  After a digital rectal exam was performed, the EC-[id number] ([id number]) colonoscope was advanced from the anus through the rectum and colon to the area of the cecum, ileocecal valve and appendiceal orifice.  The cecum was deeply intubated.  These structures were well-seen and photographed for the record.  From the level of the cecum and ileocecal valve, the scope was slowly and cautiously withdrawn.  The mucosal surfaces were carefully surveyed utilizing scope tip deflection to facilitate fold flattening as needed.  The scope was pulled down into the rectum where a thorough examination including retroflexion was performed. <<PROCEDUREIMAGES>>  FINDINGS: Friable anal canal; mamillations in the distal rectum; otherwise, rectal mucosa appeared entirely normal.  Residual pancolonic diverticula; surgical anastomosis identified at 25 cm. Single diminutive descending colon polyp; otherwise, normal residual colon. Distal 5 cm of terminal ileal mucosa appeared normal.  THERAPEUTIC / DIAGNOSTIC MANEUVERS PERFORMED: The descending  colon polyps cold biopsied/removed  COMPLICATIONS:  None  CECAL WITHDRAWAL TIME: 9 minutes  IMPRESSION:  Friable anal canal-suspect trivial rectal bleeding from this location. Colonic diverticulosis status post segmental resection. Single diminutive polyp status post cold biopsy removal  RECOMMENDATIONS:  Course of Anusol suppositories. Daily fiber supplementation - emphasized with patient's husband( Benefiber 2 tablespoons every day would be a good choice). Further recommendations to follow pending review of pathology report. If bleeding is seen to continue, patient is to notify me.  ______________________________ R. Roetta Sessions, MD Caleen Essex  CC:  Glenice Laine, M.D.  n. eSIGNED:   R. Roetta Sessions at 03/28/2012 08:06 AM  Lenard Simmer, [phone]

## 2012-03-30 ENCOUNTER — Encounter: Payer: Self-pay | Admitting: Internal Medicine

## 2012-04-02 ENCOUNTER — Encounter (HOSPITAL_COMMUNITY): Payer: Self-pay | Admitting: Internal Medicine

## 2012-04-02 ENCOUNTER — Encounter: Payer: Self-pay | Admitting: *Deleted

## 2012-05-24 NOTE — Progress Notes (Signed)
REVIEWED.  

## 2012-08-31 ENCOUNTER — Emergency Department (HOSPITAL_COMMUNITY)
Admission: EM | Admit: 2012-08-31 | Discharge: 2012-08-31 | Disposition: A | Discharge status: Home / Self Care | Payer: BC Managed Care – PPO | Attending: Emergency Medicine | Admitting: Emergency Medicine

## 2012-08-31 ENCOUNTER — Encounter (HOSPITAL_COMMUNITY): Payer: Self-pay | Admitting: *Deleted

## 2012-08-31 DIAGNOSIS — Z9889 Other specified postprocedural states: Secondary | ICD-10-CM | POA: Insufficient documentation

## 2012-08-31 DIAGNOSIS — Z8739 Personal history of other diseases of the musculoskeletal system and connective tissue: Secondary | ICD-10-CM | POA: Insufficient documentation

## 2012-08-31 DIAGNOSIS — IMO0001 Reserved for inherently not codable concepts without codable children: Secondary | ICD-10-CM | POA: Insufficient documentation

## 2012-08-31 DIAGNOSIS — Z90710 Acquired absence of both cervix and uterus: Secondary | ICD-10-CM | POA: Insufficient documentation

## 2012-08-31 DIAGNOSIS — Z79899 Other long term (current) drug therapy: Secondary | ICD-10-CM | POA: Insufficient documentation

## 2012-08-31 DIAGNOSIS — Z87891 Personal history of nicotine dependence: Secondary | ICD-10-CM | POA: Insufficient documentation

## 2012-08-31 DIAGNOSIS — Z8719 Personal history of other diseases of the digestive system: Secondary | ICD-10-CM | POA: Insufficient documentation

## 2012-08-31 DIAGNOSIS — Z8659 Personal history of other mental and behavioral disorders: Secondary | ICD-10-CM | POA: Insufficient documentation

## 2012-08-31 DIAGNOSIS — E7800 Pure hypercholesterolemia, unspecified: Secondary | ICD-10-CM | POA: Insufficient documentation

## 2012-08-31 DIAGNOSIS — R252 Cramp and spasm: Secondary | ICD-10-CM | POA: Insufficient documentation

## 2012-08-31 DIAGNOSIS — E119 Type 2 diabetes mellitus without complications: Secondary | ICD-10-CM | POA: Insufficient documentation

## 2012-08-31 LAB — BASIC METABOLIC PANEL
Chloride: 99 mEq/L (ref 96–112)
GFR calc Af Amer: 89 mL/min — ABNORMAL LOW (ref >90)
Potassium: 4 mEq/L (ref 3.5–5.1)
Sodium: 137 mEq/L (ref 135–145)

## 2012-08-31 MED ORDER — MAGNESIUM OXIDE 400 MG PO TABS: 400.0000 mg | ORAL_TABLET | Freq: Every day | ORAL | Status: DC

## 2012-08-31 MED ORDER — DIAZEPAM 5 MG PO TABS
5.0000 mg | ORAL_TABLET | Freq: Once | ORAL | Status: AC
  Administered 2012-08-31: 5 mg via ORAL
  Filled 2012-08-31: qty 1

## 2012-08-31 MED ORDER — DIAZEPAM 5 MG PO TABS
5.0000 mg | ORAL_TABLET | Freq: Two times a day (BID) | ORAL | Status: DC | PRN
Start: 2012-08-31 — End: 2012-09-26

## 2012-08-31 MED ORDER — KETOROLAC TROMETHAMINE 60 MG/2ML IM SOLN
60.0000 mg | Freq: Once | INTRAMUSCULAR | Status: AC
  Administered 2012-08-31: 60 mg via INTRAMUSCULAR
  Filled 2012-08-31: qty 2

## 2012-08-31 NOTE — ED Provider Notes (Signed)
History     CSN: [id number]  Arrival date & time 08/31/12  2035   First MD Initiated Contact with Patient 08/31/12 2122      Chief Complaint  Patient presents with  . Leg Pain    (Consider location/radiation/quality/duration/timing/severity/associated sxs/prior treatment) HPI Comments: Patient c/o intermittent muscle cramps to her posterior right thigh that began on the evening prior to ED arrival.  States that she has a hx of leg cramps, but this episode has lasted longer than usual.  States she has not been able to sleep due to the pain.  Symptoms are worse at night and improve when she stands and bears weight to her leg.  She denies redness, numbness or swelling.  No previous hx of DVT  Patient is a 52 y.o. female presenting with leg pain. The history is provided by the patient.  Leg Pain  The incident occurred yesterday. The incident occurred at home. There was no injury mechanism. The pain is present in the right thigh. The quality of the pain is described as aching (cramping). The pain is moderate. The pain has been fluctuating since onset. Pertinent negatives include no numbness, no inability to bear weight, no loss of motion, no muscle weakness, no loss of sensation and no tingling. She reports no foreign bodies present. Nothing aggravates the symptoms. Treatments tried: several "home remedies" The treatment provided no relief.    Past Medical History  Diagnosis Date  . Diabetes mellitus   . Diverticulosis   . Hypercholesteremia   . Anxiety   . GERD (gastroesophageal reflux disease)     Past Surgical History  Procedure Date  . Abdominal hysterectomy     complete  . Foot surgery     right-tendon repair  . Colonoscopy 01/04/2010    left sided transverse diverticula/two diminutive rectal polyps  . Laparoscopic sigmoid colectomy 02/01/10    Dr Clydene Pugh  . Cardiac catheterization 2011  . Colonoscopy 03/28/2012    Procedure: COLONOSCOPY;  Surgeon: Corbin Ade, MD;  [facility];  Service: Endoscopy;  Laterality: N/A;  9:30    Family History  Problem Relation Age of Onset  . Colon cancer Maternal Grandfather   . Colon polyps Mother     History  Substance Use Topics  . Smoking status: Former Smoker -- 1.0 packs/day for 10 years    Types: Cigarettes  . Smokeless tobacco: Former Neurosurgeon    Quit date: 03/28/2008  . Alcohol Use: Yes     Comment: occasional    OB History    Grav Para Term Preterm Abortions TAB SAB Ect Mult Living                  Review of Systems  Constitutional: Negative for fever and chills.  Genitourinary: Negative for dysuria and difficulty urinating.  Musculoskeletal: Positive for myalgias. Negative for back pain, joint swelling and arthralgias.  Skin: Negative for color change and wound.  Neurological: Negative for dizziness, tingling, weakness and numbness.  All other systems reviewed and are negative.    Allergies  Sulfonamide derivatives and Latex  Home Medications   Current Outpatient Rx  Name  Route  Sig  Dispense  Refill  . DIPHENHYDRAMINE HCL 25 MG PO TABS   Oral   Take 25 mg by mouth at bedtime.         . INVOKANA 100 MG PO TABS   Oral   Take 100 mg by mouth daily.          Marland Kitchen  LANTUS SOLOSTAR 100 UNIT/ML [place] SOLN   Subcutaneous   Inject 40 Units into the skin at bedtime.          Marland Kitchen METFORMIN HCL 500 MG PO TABS   Oral   Take 500 mg by mouth 2 (two) times daily with a meal.         . SIMVASTATIN 20 MG PO TABS   Oral   Take 20 mg by mouth at bedtime.            BP 124/62  Pulse 90  Temp 98.2 F (36.8 C) (Oral)  Resp 18  Ht 5\' 3"  (1.6 m)  Wt 195 lb (88.451 kg)  BMI 34.54 kg/m2  SpO2 96%  Physical Exam  Nursing note and vitals reviewed. Constitutional: She is oriented to person, place, and time. She appears well-developed and well-nourished. No distress.  HENT:  Head: Normocephalic and atraumatic.  Cardiovascular: Normal rate, regular rhythm, normal  heart sounds and intact distal pulses.   Pulmonary/Chest: Effort normal and breath sounds normal. She exhibits no tenderness.  Musculoskeletal: She exhibits tenderness. She exhibits no edema.       Tenderness to the posterior right upper leg. No erythema or edema.  Right calf is NT.  DP pulse is brisk, distal sensation intact.  Pt has full ROM of the right hip, knee and ankle.    Neurological: She is alert and oriented to person, place, and time. She exhibits normal muscle tone. Coordination normal.  Skin: Skin is warm and dry. No erythema.    ED Course  Procedures (including critical care time)  Results for orders placed during the hospital encounter of 08/31/12  BASIC METABOLIC PANEL      Component Value Range   Sodium 137  135 - 145 mEq/L   Potassium 4.0  3.5 - 5.1 mEq/L   Chloride 99  96 - 112 mEq/L   CO2 29  19 - 32 mEq/L   Glucose, Bld 217 (*) 70 - 99 mg/dL   BUN 18  6 - 23 mg/dL   Creatinine, Ser 4.09  0.50 - 1.10 mg/dL   Calcium 9.6  8.4 - 81.1 mg/dL   GFR calc non Af Amer 77 (*) >90 mL/min   GFR calc Af Amer 89 (*) >90 mL/min         MDM      Patient leg pain has greatly improved after toradol injection and valuim.  No focl neuro deficits, ambulates with a steady gait.  Pt also seen by EDP.  Care plan discussed.  Pt agrees to close f/u with her PMD. Sx's likely related to muscle cramps.  Doubt DVT.   Prescribed: Valium 5 mg # 12 Magnesium oxide      Lilli Dewald L. Valentina Alcoser, [place] 09/02/12 1246

## 2012-08-31 NOTE — ED Notes (Signed)
Pt reporting cramp in back of upper right leg.  States has pain began last night, pain worse today. No relief from home treatments; tylenol, icy hot, pickle juice.

## 2012-09-02 NOTE — ED Provider Notes (Signed)
Medical screening examination/treatment/procedure(s) were conducted as a shared visit with non-physician practitioner(s) and myself.  I personally evaluated the patient during the encounter Pt with muscle cramps in left sartorius muscle.  Rx Mag oxice, and Valium.  Advised about stretching and also about walking the cramping off.  Carleene Cooper III, MD 09/02/12 2042

## 2012-09-26 ENCOUNTER — Encounter (HOSPITAL_COMMUNITY): Payer: Self-pay

## 2012-09-26 ENCOUNTER — Emergency Department (HOSPITAL_COMMUNITY)
Admission: EM | Admit: 2012-09-26 | Discharge: 2012-09-26 | Disposition: A | Discharge status: Home / Self Care | Payer: BC Managed Care – PPO | Attending: Emergency Medicine | Admitting: Emergency Medicine

## 2012-09-26 DIAGNOSIS — Z87891 Personal history of nicotine dependence: Secondary | ICD-10-CM | POA: Insufficient documentation

## 2012-09-26 DIAGNOSIS — R079 Chest pain, unspecified: Secondary | ICD-10-CM | POA: Insufficient documentation

## 2012-09-26 DIAGNOSIS — Z794 Long term (current) use of insulin: Secondary | ICD-10-CM | POA: Insufficient documentation

## 2012-09-26 DIAGNOSIS — Z8709 Personal history of other diseases of the respiratory system: Secondary | ICD-10-CM | POA: Insufficient documentation

## 2012-09-26 DIAGNOSIS — R062 Wheezing: Secondary | ICD-10-CM | POA: Insufficient documentation

## 2012-09-26 DIAGNOSIS — R05 Cough: Secondary | ICD-10-CM | POA: Insufficient documentation

## 2012-09-26 DIAGNOSIS — J3489 Other specified disorders of nose and nasal sinuses: Secondary | ICD-10-CM | POA: Insufficient documentation

## 2012-09-26 DIAGNOSIS — E119 Type 2 diabetes mellitus without complications: Secondary | ICD-10-CM | POA: Insufficient documentation

## 2012-09-26 DIAGNOSIS — E7800 Pure hypercholesterolemia, unspecified: Secondary | ICD-10-CM | POA: Insufficient documentation

## 2012-09-26 DIAGNOSIS — R059 Cough, unspecified: Secondary | ICD-10-CM | POA: Insufficient documentation

## 2012-09-26 DIAGNOSIS — H9209 Otalgia, unspecified ear: Secondary | ICD-10-CM | POA: Insufficient documentation

## 2012-09-26 DIAGNOSIS — Z8659 Personal history of other mental and behavioral disorders: Secondary | ICD-10-CM | POA: Insufficient documentation

## 2012-09-26 DIAGNOSIS — Z79899 Other long term (current) drug therapy: Secondary | ICD-10-CM | POA: Insufficient documentation

## 2012-09-26 DIAGNOSIS — Z8719 Personal history of other diseases of the digestive system: Secondary | ICD-10-CM | POA: Insufficient documentation

## 2012-09-26 DIAGNOSIS — Z9861 Coronary angioplasty status: Secondary | ICD-10-CM | POA: Insufficient documentation

## 2012-09-26 MED ORDER — AZITHROMYCIN 250 MG PO TABS: ORAL_TABLET | ORAL | Status: DC

## 2012-09-26 MED ORDER — GUAIFENESIN-CODEINE 100-10 MG/5ML PO SYRP: 10.0000 mL | ORAL_SOLUTION | Freq: Three times a day (TID) | ORAL | Status: AC | PRN

## 2012-09-26 MED ORDER — ALBUTEROL SULFATE HFA 108 (90 BASE) MCG/ACT IN AERS
2.0000 | INHALATION_SPRAY | Freq: Once | RESPIRATORY_TRACT | Status: AC
  Administered 2012-09-26: 2 via RESPIRATORY_TRACT
  Filled 2012-09-26: qty 6.7

## 2012-09-26 NOTE — ED Notes (Signed)
Cough that started on Sunday, congestion and "feels like water is in ears", hurts when she coughs

## 2012-09-26 NOTE — ED Provider Notes (Signed)
History     CSN: [id number]  Arrival date & time 09/26/12  1422   First MD Initiated Contact with Patient 09/26/12 1446      Chief Complaint  Patient presents with  . Cough  . Nasal Congestion    (Consider location/radiation/quality/duration/timing/severity/associated sxs/prior treatment) HPI Comments: Patient c/o cough, nasal congestion and fullness of her ears that began this morning.  States she has mostly a non-productive cough, but upper "tightness" in her chest with coughing.  She denies fever, chills, body aches or vomiting.  States she did not take a "flu shot" this year.      Patient is a 51 y.o. female presenting with cough. The history is provided by the patient.  Cough This is a new problem. The current episode started 12 to 24 hours ago. The problem occurs constantly. The cough is non-productive. There has been no fever. Associated symptoms include chest pain, ear pain, rhinorrhea and wheezing. Pertinent negatives include no chills, no sweats, no ear congestion, no headaches, no sore throat, no myalgias and no shortness of breath. She has tried nothing for the symptoms. The treatment provided no relief. She is not a smoker. Her past medical history is significant for bronchitis. Her past medical history does not include COPD or asthma.    Past Medical History  Diagnosis Date  . Diabetes mellitus   . Diverticulosis   . Hypercholesteremia   . Anxiety   . GERD (gastroesophageal reflux disease)     Past Surgical History  Procedure Date  . Abdominal hysterectomy     complete  . Foot surgery     right-tendon repair  . Colonoscopy 01/04/2010    left sided transverse diverticula/two diminutive rectal polyps  . Laparoscopic sigmoid colectomy 02/01/10    Dr Clydene Pugh  . Cardiac catheterization 2011  . Colonoscopy 03/28/2012    Procedure: COLONOSCOPY;  Surgeon: Corbin Ade, MD;  [facility]: [facility];  Service: Endoscopy;  Laterality: N/A;  9:30     Family History  Problem Relation Age of Onset  . Colon cancer Maternal Grandfather   . Colon polyps Mother     History  Substance Use Topics  . Smoking status: Former Smoker -- 1.0 packs/day for 10 years    Types: Cigarettes  . Smokeless tobacco: Former Neurosurgeon    Quit date: 03/28/2008  . Alcohol Use: Yes     Comment: occasional    OB History    Grav Para Term Preterm Abortions TAB SAB Ect Mult Living                  Review of Systems  Constitutional: Negative for fever, chills, activity change and appetite change.  HENT: Positive for ear pain, congestion and rhinorrhea. Negative for sore throat, facial swelling, trouble swallowing, neck pain and neck stiffness.   Eyes: Negative for visual disturbance.  Respiratory: Positive for cough and wheezing. Negative for shortness of breath and stridor.   Cardiovascular: Positive for chest pain.  Gastrointestinal: Negative for nausea, vomiting and abdominal pain.  Genitourinary: Negative for dysuria and flank pain.  Musculoskeletal: Negative for myalgias.  Skin: Negative.   Neurological: Negative for dizziness, weakness, numbness and headaches.  Hematological: Negative for adenopathy.  Psychiatric/Behavioral: Negative for confusion.  All other systems reviewed and are negative.    Allergies  Sulfonamide derivatives and Latex  Home Medications   Current Outpatient Rx  Name  Route  Sig  Dispense  Refill  . DIPHENHYDRAMINE HCL 25 MG PO TABS  Oral   Take 25 mg by mouth at bedtime.         . INVOKANA 100 MG PO TABS   Oral   Take 100 mg by mouth daily.          Marland Kitchen LANTUS SOLOSTAR 100 UNIT/ML [place] SOLN   Subcutaneous   Inject 40 Units into the skin at bedtime.          Marland Kitchen MAGNESIUM OXIDE 400 MG PO TABS   Oral   Take 1 tablet (400 mg total) by mouth daily.   20 tablet   0   . METFORMIN HCL 500 MG PO TABS   Oral   Take 500 mg by mouth 2 (two) times daily with a meal.         . SIMVASTATIN 20 MG PO TABS    Oral   Take 20 mg by mouth at bedtime.            BP 123/79  Pulse 84  Temp 98.4 F (36.9 C) (Oral)  Resp 22  SpO2 100%  Physical Exam  Nursing note and vitals reviewed. Constitutional: She is oriented to person, place, and time. She appears well-developed and well-nourished. No distress.  HENT:  Head: Normocephalic and atraumatic.  Right Ear: Ear canal normal. No mastoid tenderness. Tympanic membrane is not erythematous and not bulging. No middle ear effusion. No hemotympanum.  Left Ear: Ear canal normal. No mastoid tenderness. Tympanic membrane is not erythematous and not bulging. A middle ear effusion is present. No hemotympanum.  Nose: Mucosal edema and rhinorrhea present.  Mouth/Throat: Uvula is midline, oropharynx is clear and moist and mucous membranes are normal. No oropharyngeal exudate.  Eyes: EOM are normal. Pupils are equal, round, and reactive to light.  Neck: Normal range of motion. Neck supple.  Cardiovascular: Normal rate, regular rhythm, normal heart sounds and intact distal pulses.   No murmur heard. Pulmonary/Chest: Effort normal. No respiratory distress. She has wheezes. She has no rales. She exhibits no tenderness.       Coarse lungs sounds bilaterally.  Few inspiratory wheezes  Musculoskeletal: She exhibits no edema.  Lymphadenopathy:    She has no cervical adenopathy.  Neurological: She is alert and oriented to person, place, and time. She exhibits normal muscle tone. Coordination normal.  Skin: Skin is warm and dry.    ED Course  Procedures (including critical care time)  Labs Reviewed - No data to display No results found.      MDM   Vitals are stable, pt is non-toxic appearing.  Coarse lung sounds bilaterally with few scattered inspiratory wheezes.  No rales.    Lung sounds improved after the albuterol.  Will prescribe z-pack and robutussin AC.  She agrees to f/u with her PMD.       Racheal Mathurin L. Aysia Lowder, [place] 09/26/12 1630

## 2012-09-26 NOTE — ED Notes (Signed)
pt with NP cough and HA starting today, denies fever

## 2012-09-26 NOTE — ED Provider Notes (Signed)
Medical screening examination/treatment/procedure(s) were performed by non-physician practitioner and as supervising physician I was immediately available for consultation/collaboration. Devoria Albe, MD, Armando Gang   Ward Givens, MD 09/26/12 [phone]

## 2013-04-15 ENCOUNTER — Encounter (HOSPITAL_COMMUNITY): Payer: Self-pay | Admitting: *Deleted

## 2013-04-15 ENCOUNTER — Emergency Department (HOSPITAL_COMMUNITY)
Admission: EM | Admit: 2013-04-15 | Discharge: 2013-04-15 | Disposition: A | Discharge status: Home / Self Care | Payer: BC Managed Care – PPO | Attending: Emergency Medicine | Admitting: Emergency Medicine

## 2013-04-15 DIAGNOSIS — R51 Headache: Secondary | ICD-10-CM | POA: Insufficient documentation

## 2013-04-15 DIAGNOSIS — Z8719 Personal history of other diseases of the digestive system: Secondary | ICD-10-CM | POA: Insufficient documentation

## 2013-04-15 DIAGNOSIS — Z87891 Personal history of nicotine dependence: Secondary | ICD-10-CM | POA: Insufficient documentation

## 2013-04-15 DIAGNOSIS — Z794 Long term (current) use of insulin: Secondary | ICD-10-CM | POA: Insufficient documentation

## 2013-04-15 DIAGNOSIS — H9209 Otalgia, unspecified ear: Secondary | ICD-10-CM | POA: Insufficient documentation

## 2013-04-15 DIAGNOSIS — Z91040 Latex allergy status: Secondary | ICD-10-CM | POA: Insufficient documentation

## 2013-04-15 DIAGNOSIS — E119 Type 2 diabetes mellitus without complications: Secondary | ICD-10-CM | POA: Insufficient documentation

## 2013-04-15 DIAGNOSIS — Z9861 Coronary angioplasty status: Secondary | ICD-10-CM | POA: Insufficient documentation

## 2013-04-15 DIAGNOSIS — R5381 Other malaise: Secondary | ICD-10-CM | POA: Insufficient documentation

## 2013-04-15 DIAGNOSIS — K219 Gastro-esophageal reflux disease without esophagitis: Secondary | ICD-10-CM | POA: Insufficient documentation

## 2013-04-15 DIAGNOSIS — E7800 Pure hypercholesterolemia, unspecified: Secondary | ICD-10-CM | POA: Insufficient documentation

## 2013-04-15 DIAGNOSIS — J3489 Other specified disorders of nose and nasal sinuses: Secondary | ICD-10-CM | POA: Insufficient documentation

## 2013-04-15 DIAGNOSIS — IMO0001 Reserved for inherently not codable concepts without codable children: Secondary | ICD-10-CM | POA: Insufficient documentation

## 2013-04-15 DIAGNOSIS — J029 Acute pharyngitis, unspecified: Secondary | ICD-10-CM | POA: Insufficient documentation

## 2013-04-15 DIAGNOSIS — F411 Generalized anxiety disorder: Secondary | ICD-10-CM | POA: Insufficient documentation

## 2013-04-15 DIAGNOSIS — Z79899 Other long term (current) drug therapy: Secondary | ICD-10-CM | POA: Insufficient documentation

## 2013-04-15 LAB — RAPID STREP SCREEN (MED CTR MEBANE ONLY): Streptococcus, Group A Screen (Direct): NEGATIVE

## 2013-04-15 MED ORDER — PROMETHAZINE HCL 12.5 MG PO TABS
12.5000 mg | ORAL_TABLET | Freq: Once | ORAL | Status: AC
  Administered 2013-04-15: 12.5 mg via ORAL
  Filled 2013-04-15: qty 1

## 2013-04-15 MED ORDER — AMOXICILLIN 500 MG PO CAPS: 500.0000 mg | ORAL_CAPSULE | Freq: Three times a day (TID) | ORAL | Status: DC

## 2013-04-15 MED ORDER — HYDROCODONE-ACETAMINOPHEN 5-325 MG PO TABS: ORAL_TABLET | ORAL | Status: DC

## 2013-04-15 MED ORDER — AMOXICILLIN 250 MG PO CAPS
500.0000 mg | ORAL_CAPSULE | Freq: Once | ORAL | Status: AC
  Administered 2013-04-15: 500 mg via ORAL
  Filled 2013-04-15: qty 2

## 2013-04-15 MED ORDER — HYDROCODONE-ACETAMINOPHEN 5-325 MG PO TABS
1.0000 | ORAL_TABLET | Freq: Once | ORAL | Status: AC
  Administered 2013-04-15: 1 via ORAL
  Filled 2013-04-15: qty 1

## 2013-04-15 MED ORDER — IBUPROFEN 800 MG PO TABS
800.0000 mg | ORAL_TABLET | Freq: Once | ORAL | Status: AC
  Administered 2013-04-15: 800 mg via ORAL
  Filled 2013-04-15: qty 1

## 2013-04-15 NOTE — Discharge Instructions (Signed)
Salt Water Gargle °This solution will help make your mouth and throat feel better. °HOME CARE INSTRUCTIONS  °· Mix 1 teaspoon of salt in 8 ounces of warm water. °· Gargle with this solution as much or often as you need or as directed. Swish and gargle gently if you have any sores or wounds in your mouth. °· Do not swallow this mixture. °Document Released: 05/12/2004 Document Revised: 10/31/2011 Document Reviewed: 10/03/2008 °ExitCare® Patient Information ©2014 [employer]. ° °Sore Throat °A sore throat is pain, burning, irritation, or scratchiness of the throat. There is often pain or tenderness when swallowing or talking. A sore throat may be accompanied by other symptoms, such as coughing, sneezing, fever, and swollen neck glands. A sore throat is often the first sign of another sickness, such as a cold, flu, strep throat, or mononucleosis (commonly known as mono). Most sore throats go away without medical treatment. °CAUSES  °The most common causes of a sore throat include: °· A viral infection, such as a cold, flu, or mono. °· A bacterial infection, such as strep throat, tonsillitis, or whooping cough. °· Seasonal allergies. °· Dryness in the air. °· Irritants, such as smoke or pollution. °· Gastroesophageal reflux disease (GERD). °HOME CARE INSTRUCTIONS  °· Only take over-the-counter medicines as directed by your caregiver. °· Drink enough fluids to keep your urine clear or pale yellow. °· Rest as needed. °· Try using throat sprays, lozenges, or sucking on hard candy to ease any pain (if older than 4 years or as directed). °· Sip warm liquids, such as broth, herbal tea, or warm water with honey to relieve pain temporarily. You may also eat or drink cold or frozen liquids such as frozen ice pops. °· Gargle with salt water (mix 1 tsp salt with 8 oz of water). °· Do not smoke and avoid secondhand smoke. °· Put a cool-mist humidifier in your bedroom at night to moisten the air. You can also turn on a hot shower  and sit in the bathroom with the door closed for 5 10 minutes. °SEEK IMMEDIATE MEDICAL CARE IF: °· You have difficulty breathing. °· You are unable to swallow fluids, soft foods, or your saliva. °· You have increased swelling in the throat. °· Your sore throat does not get better in 7 days. °· You have nausea and vomiting. °· You have a fever or persistent symptoms for more than 2 3 days. °· You have a fever and your symptoms suddenly get worse. °MAKE SURE YOU:  °· Understand these instructions. °· Will watch your condition. °· Will get help right away if you are not doing well or get worse. °Document Released: 09/15/2004 Document Revised: 07/25/2012 Document Reviewed: 04/15/2012 °ExitCare® Patient Information ©2014 [employer]. ° °

## 2013-04-15 NOTE — ED Provider Notes (Signed)
Medical screening examination/treatment/procedure(s) were performed by non-physician practitioner and as supervising physician I was immediately available for consultation/collaboration.   Benny Lennert, MD 04/15/13 2022

## 2013-04-15 NOTE — ED Notes (Signed)
Sore throat , onset today

## 2013-04-15 NOTE — ED Provider Notes (Signed)
CSN: [id number]     Arrival date & time 04/15/13  1813 History   First MD Initiated Contact with Patient 04/15/13 1836     Chief Complaint  Patient presents with  . Sore Throat   (Consider location/radiation/quality/duration/timing/severity/associated sxs/prior Treatment) Patient is a 52 y.o. female presenting with pharyngitis. The history is provided by the patient.  Sore Throat This is a new problem. The current episode started today. The problem occurs constantly. The problem has been gradually worsening. Associated symptoms include fatigue, headaches, myalgias and a sore throat. Pertinent negatives include no abdominal pain, arthralgias, chest pain, coughing, neck pain or rash. The symptoms are aggravated by swallowing. She has tried nothing for the symptoms. The treatment provided no relief.    Past Medical History  Diagnosis Date  . Diabetes mellitus   . Diverticulosis   . Hypercholesteremia   . Anxiety   . GERD (gastroesophageal reflux disease)    Past Surgical History  Procedure Laterality Date  . Abdominal hysterectomy      complete  . Foot surgery      right-tendon repair  . Colonoscopy  01/04/2010    left sided transverse diverticula/two diminutive rectal polyps  . Laparoscopic sigmoid colectomy  02/01/10    Dr Clydene Pugh  . Colonoscopy  03/28/2012    Procedure: COLONOSCOPY;  Surgeon: Corbin Ade, MD;  [facility];  Service: Endoscopy;  Laterality: N/A;  9:30  . Cardiac catheterization  2011   Family History  Problem Relation Age of Onset  . Colon cancer Maternal Grandfather   . Colon polyps Mother    History  Substance Use Topics  . Smoking status: Former Smoker -- 1.00 packs/day for 10 years    Types: Cigarettes  . Smokeless tobacco: Former Neurosurgeon    Quit date: 03/28/2008  . Alcohol Use: Yes     Comment: occasional   OB History   Grav Para Term Preterm Abortions TAB SAB Ect Mult Living                 Review of Systems   Constitutional: Positive for fatigue. Negative for activity change.       All ROS Neg except as noted in HPI  HENT: Positive for ear pain and sore throat. Negative for nosebleeds and neck pain.   Eyes: Negative for photophobia and discharge.  Respiratory: Negative for cough, shortness of breath and wheezing.   Cardiovascular: Negative for chest pain and palpitations.  Gastrointestinal: Negative for abdominal pain and blood in stool.  Genitourinary: Negative for dysuria, frequency and hematuria.  Musculoskeletal: Positive for myalgias. Negative for back pain and arthralgias.  Skin: Negative.  Negative for rash.  Neurological: Positive for headaches. Negative for dizziness, seizures and speech difficulty.  Psychiatric/Behavioral: Negative for hallucinations and confusion.    Allergies  Sulfonamide derivatives and Latex  Home Medications   Current Outpatient Rx  Name  Route  Sig  Dispense  Refill  . amoxicillin (AMOXIL) 500 MG capsule   Oral   Take 1 capsule (500 mg total) by mouth 3 (three) times daily.   21 capsule   0   . azithromycin (ZITHROMAX Z-PAK) 250 MG tablet      Take two tablets on day one, then one tab qd days 2-5   6 tablet   0   . diphenhydrAMINE (BENADRYL) 25 MG tablet   Oral   Take 25 mg by mouth at bedtime.         Marland Kitchen HYDROcodone-acetaminophen (NORCO/VICODIN) 5-325 MG  per tablet      1 or 2 po q4h prn pain   16 tablet   0   . INVOKANA 100 MG TABS   Oral   Take 100 mg by mouth daily.          Marland Kitchen LANTUS SOLOSTAR 100 UNIT/ML injection   Subcutaneous   Inject 40 Units into the skin at bedtime.          . magnesium oxide (MAG-OX 400) 400 MG tablet   Oral   Take 1 tablet (400 mg total) by mouth daily.   20 tablet   0   . metFORMIN (GLUCOPHAGE) 500 MG tablet   Oral   Take 500 mg by mouth 2 (two) times daily with a meal.         . simvastatin (ZOCOR) 20 MG tablet   Oral   Take 20 mg by mouth at bedtime.           BP 119/72  Pulse 94   Temp(Src) 98.9 F (37.2 C) (Oral)  Resp 20  Ht 5\' 3"  (1.6 m)  Wt 190 lb (86.183 kg)  BMI 33.67 kg/m2  SpO2 98% Physical Exam  Nursing note and vitals reviewed. Constitutional: She is oriented to person, place, and time. She appears well-developed and well-nourished.  Non-toxic appearance.  HENT:  Head: Normocephalic.  Right Ear: Tympanic membrane and external ear normal.  Left Ear: Tympanic membrane and external ear normal.  Mouth/Throat: Uvula is midline. Posterior oropharyngeal erythema present.  Mild congestion  Eyes: EOM and lids are normal. Pupils are equal, round, and reactive to light.  Neck: Normal range of motion. Neck supple. Carotid bruit is not present.  Cardiovascular: Normal rate, regular rhythm, normal heart sounds, intact distal pulses and normal pulses.   Pulmonary/Chest: Breath sounds normal. No respiratory distress.  Abdominal: Soft. Bowel sounds are normal. There is no tenderness. There is no guarding.  Musculoskeletal: Normal range of motion.  Lymphadenopathy:       Head (right side): No submandibular adenopathy present.       Head (left side): No submandibular adenopathy present.    She has no cervical adenopathy.  Neurological: She is alert and oriented to person, place, and time. She has normal strength. No cranial nerve deficit or sensory deficit.  Skin: Skin is warm and dry.  Psychiatric: She has a normal mood and affect. Her speech is normal.    ED Course  Procedures (including critical care time) Labs Review Labs Reviewed  RAPID STREP SCREEN  CULTURE, GROUP A STREP   Imaging Review No results found. Pulse ox 98% on room air. WNL by my interpretation. MDM   1. Pharyngitis    **I have reviewed nursing notes, vital signs, and all appropriate lab and imaging results for this patient.*  Pt presents to ED with sore throat, headache, body aches, and generally not feeling well. Vital signs are stable Strep test is negative. Pt will use salt water  gargles. Tylenol or ibuprofen for fever and aching. Norco for headache.  Kathie Dike, PA-C 04/15/13 1949

## 2013-04-17 LAB — CULTURE, GROUP A STREP

## 2013-05-10 ENCOUNTER — Emergency Department (HOSPITAL_COMMUNITY): Payer: BC Managed Care – PPO

## 2013-05-10 ENCOUNTER — Encounter (HOSPITAL_COMMUNITY): Payer: Self-pay | Admitting: *Deleted

## 2013-05-10 ENCOUNTER — Emergency Department (HOSPITAL_COMMUNITY)
Admission: EM | Admit: 2013-05-10 | Discharge: 2013-05-10 | Disposition: A | Discharge status: Home / Self Care | Payer: BC Managed Care – PPO | Attending: Emergency Medicine | Admitting: Emergency Medicine

## 2013-05-10 DIAGNOSIS — Z79899 Other long term (current) drug therapy: Secondary | ICD-10-CM | POA: Insufficient documentation

## 2013-05-10 DIAGNOSIS — Z87891 Personal history of nicotine dependence: Secondary | ICD-10-CM | POA: Insufficient documentation

## 2013-05-10 DIAGNOSIS — Z862 Personal history of diseases of the blood and blood-forming organs and certain disorders involving the immune mechanism: Secondary | ICD-10-CM | POA: Insufficient documentation

## 2013-05-10 DIAGNOSIS — Z792 Long term (current) use of antibiotics: Secondary | ICD-10-CM | POA: Insufficient documentation

## 2013-05-10 DIAGNOSIS — J209 Acute bronchitis, unspecified: Secondary | ICD-10-CM | POA: Insufficient documentation

## 2013-05-10 DIAGNOSIS — Z8639 Personal history of other endocrine, nutritional and metabolic disease: Secondary | ICD-10-CM | POA: Insufficient documentation

## 2013-05-10 DIAGNOSIS — Z8719 Personal history of other diseases of the digestive system: Secondary | ICD-10-CM | POA: Insufficient documentation

## 2013-05-10 DIAGNOSIS — Z9861 Coronary angioplasty status: Secondary | ICD-10-CM | POA: Insufficient documentation

## 2013-05-10 DIAGNOSIS — Z91040 Latex allergy status: Secondary | ICD-10-CM | POA: Insufficient documentation

## 2013-05-10 DIAGNOSIS — Z8659 Personal history of other mental and behavioral disorders: Secondary | ICD-10-CM | POA: Insufficient documentation

## 2013-05-10 DIAGNOSIS — E119 Type 2 diabetes mellitus without complications: Secondary | ICD-10-CM | POA: Insufficient documentation

## 2013-05-10 DIAGNOSIS — J9801 Acute bronchospasm: Secondary | ICD-10-CM

## 2013-05-10 DIAGNOSIS — J40 Bronchitis, not specified as acute or chronic: Secondary | ICD-10-CM

## 2013-05-10 LAB — CBC WITH DIFFERENTIAL/PLATELET
Basophils Relative: 0 % (ref 0–1)
Eosinophils Absolute: 0.1 10*3/uL (ref 0.0–0.7)
Hemoglobin: 13.5 g/dL (ref 12.0–15.0)
MCH: 29 pg (ref 26.0–34.0)
MCHC: 35.6 g/dL (ref 30.0–36.0)
Monocytes Relative: 6 % (ref 3–12)
Neutrophils Relative %: 50 % (ref 43–77)

## 2013-05-10 LAB — BASIC METABOLIC PANEL
BUN: 11 mg/dL (ref 6–23)
Calcium: 10 mg/dL (ref 8.4–10.5)
Creatinine, Ser: 0.72 mg/dL (ref 0.50–1.10)
GFR calc Af Amer: >90 mL/min (ref >90)
GFR calc non Af Amer: >90 mL/min (ref >90)
Potassium: 3.7 mEq/L (ref 3.5–5.1)

## 2013-05-10 MED ORDER — ALBUTEROL SULFATE (5 MG/ML) 0.5% IN NEBU
5.0000 mg | INHALATION_SOLUTION | Freq: Once | RESPIRATORY_TRACT | Status: AC
  Administered 2013-05-10: 5 mg via RESPIRATORY_TRACT
  Filled 2013-05-10: qty 1

## 2013-05-10 MED ORDER — ALBUTEROL SULFATE (2.5 MG/3ML) 0.083% IN NEBU: 2.5000 mg | INHALATION_SOLUTION | RESPIRATORY_TRACT | Status: DC | PRN

## 2013-05-10 MED ORDER — LORAZEPAM 1 MG PO TABS
1.0000 mg | ORAL_TABLET | Freq: Once | ORAL | Status: AC
  Administered 2013-05-10: 1 mg via ORAL
  Filled 2013-05-10: qty 1

## 2013-05-10 MED ORDER — OXYCODONE-ACETAMINOPHEN 5-325 MG PO TABS
1.0000 | ORAL_TABLET | Freq: Once | ORAL | Status: AC
  Administered 2013-05-10: 1 via ORAL
  Filled 2013-05-10: qty 1

## 2013-05-10 MED ORDER — AZITHROMYCIN 250 MG PO TABS: ORAL_TABLET | ORAL | Status: DC

## 2013-05-10 NOTE — ED Notes (Signed)
Pt was concerned that her blood sugar was high. Pt was notified that her blood sugar was 139.

## 2013-05-10 NOTE — ED Notes (Addendum)
Cough,yellow sputum  At times.  Fever on Tuesday.101.   Taking mucinex and tylenol.  Pain in lt lat ribs when coughs  Headache

## 2013-05-10 NOTE — ED Provider Notes (Signed)
CSN: [id number]     Arrival date & time 05/10/13  1918 History   First MD Initiated Contact with Patient 05/10/13 1946     Chief Complaint  Patient presents with  . Cough   (Consider location/radiation/quality/duration/timing/severity/associated sxs/prior Treatment) Patient is a 52 y.o. female presenting with cough. The history is provided by the patient (the pt complains of a cough).  Cough Cough characteristics:  Productive Sputum characteristics:  Nondescript Severity:  Moderate Onset quality:  Gradual Timing:  Constant Chronicity:  New Context: not animal exposure   Associated symptoms: no chest pain, no eye discharge, no headaches and no rash     Past Medical History  Diagnosis Date  . Diabetes mellitus   . Diverticulosis   . Hypercholesteremia   . Anxiety   . GERD (gastroesophageal reflux disease)    Past Surgical History  Procedure Laterality Date  . Abdominal hysterectomy      complete  . Foot surgery      right-tendon repair  . Colonoscopy  01/04/2010    left sided transverse diverticula/two diminutive rectal polyps  . Laparoscopic sigmoid colectomy  02/01/10    Dr Clydene Pugh  . Colonoscopy  03/28/2012    Procedure: COLONOSCOPY;  Surgeon: Corbin Ade, MD;  Location: [facility];  Service: Endoscopy;  Laterality: N/A;  9:30  . Cardiac catheterization  2011   Family History  Problem Relation Age of Onset  . Colon cancer Maternal Grandfather   . Colon polyps Mother    History  Substance Use Topics  . Smoking status: Former Smoker -- 1.00 packs/day for 10 years    Types: Cigarettes  . Smokeless tobacco: Former Neurosurgeon    Quit date: 03/28/2008  . Alcohol Use: Yes     Comment: occasional   OB History   Grav Para Term Preterm Abortions TAB SAB Ect Mult Living                 Review of Systems  Constitutional: Negative for appetite change and fatigue.  HENT: Negative for congestion, sinus pressure and ear discharge.   Eyes: Negative for  discharge.  Respiratory: Positive for cough.   Cardiovascular: Negative for chest pain.  Gastrointestinal: Negative for abdominal pain and diarrhea.  Genitourinary: Negative for frequency and hematuria.  Musculoskeletal: Negative for back pain.  Skin: Negative for rash.  Neurological: Negative for seizures and headaches.  Psychiatric/Behavioral: Negative for hallucinations.    Allergies  Sulfonamide derivatives and Latex  Home Medications   Current Outpatient Rx  Name  Route  Sig  Dispense  Refill  . albuterol (PROVENTIL) (2.5 MG/3ML) 0.083% nebulizer solution   Nebulization   Take 2.5 mg by nebulization once as needed for wheezing.         . diphenhydrAMINE (BENADRYL) 25 MG tablet   Oral   Take 25 mg by mouth at bedtime as needed for allergies or sleep.          Marland Kitchen guaiFENesin (MUCINEX) 600 MG 12 hr tablet   Oral   Take 600-1,200 mg by mouth as needed for congestion.         . INVOKANA 100 MG TABS   Oral   Take 100 mg by mouth daily.          Marland Kitchen LANTUS SOLOSTAR 100 UNIT/ML injection   Subcutaneous   Inject 30 Units into the skin at bedtime.          . metFORMIN (GLUCOPHAGE) 500 MG tablet   Oral   Take  500 mg by mouth 2 (two) times daily with a meal.         . polyethylene glycol powder (GLYCOLAX/MIRALAX) powder   Oral   Take 17 g by mouth daily as needed (for constipation).         Marland Kitchen albuterol (PROVENTIL) (2.5 MG/3ML) 0.083% nebulizer solution   Nebulization   Take 3 mLs (2.5 mg total) by nebulization every 4 (four) hours as needed for wheezing.   30 vial   0   . azithromycin (ZITHROMAX Z-PAK) 250 MG tablet      2 po day one, then 1 daily x 4 days   5 tablet   0    BP 137/67  Pulse 94  Temp(Src) 98.9 F (37.2 C) (Oral)  Resp 22  Ht 5\' 3"  (1.6 m)  Wt 190 lb (86.183 kg)  BMI 33.67 kg/m2  SpO2 100% Physical Exam  Constitutional: She is oriented to person, place, and time. She appears well-developed.  HENT:  Head: Normocephalic.  Eyes:  Conjunctivae and EOM are normal. No scleral icterus.  Neck: Neck supple. No thyromegaly present.  Cardiovascular: Normal rate and regular rhythm.  Exam reveals no gallop and no friction rub.   No murmur heard. Pulmonary/Chest: No stridor. She has wheezes. She has no rales. She exhibits no tenderness.  Abdominal: She exhibits no distension. There is no tenderness. There is no rebound.  Musculoskeletal: Normal range of motion. She exhibits no edema.  Lymphadenopathy:    She has no cervical adenopathy.  Neurological: She is oriented to person, place, and time. Coordination normal.  Skin: No rash noted. No erythema.  Psychiatric: She has a normal mood and affect. Her behavior is normal.    ED Course  Procedures (including critical care time) Labs Review Labs Reviewed  BASIC METABOLIC PANEL - Abnormal; Notable for the following:    Glucose, Bld 139 (*)    All other components within normal limits  CBC WITH DIFFERENTIAL   Imaging Review Dg Chest 2 View  05/10/2013   CLINICAL DATA:  Shortness of breath and cough; fever  EXAM: CHEST  2 VIEW  COMPARISON:  December 12, 2011  FINDINGS: The lungs are clear. Heart size and pulmonary vascularity are normal. No adenopathy. No pneumothorax. No bone lesions.  IMPRESSION: No abnormality noted.   Electronically Signed   By: Bretta Bang   On: 05/10/2013 20:28    MDM   1. Bronchitis   2. Bronchospasm        Benny Lennert, MD 05/10/13 2128

## 2013-06-05 ENCOUNTER — Other Ambulatory Visit (HOSPITAL_COMMUNITY): Payer: Self-pay | Admitting: Internal Medicine

## 2013-06-05 DIAGNOSIS — N63 Unspecified lump in unspecified breast: Secondary | ICD-10-CM

## 2013-06-12 ENCOUNTER — Ambulatory Visit (HOSPITAL_COMMUNITY)
Admission: RE | Admit: 2013-06-12 | Discharge: 2013-06-12 | Disposition: A | Discharge status: Home / Self Care | Payer: BC Managed Care – PPO | Source: Ambulatory Visit | Attending: Internal Medicine | Admitting: Internal Medicine

## 2013-06-12 ENCOUNTER — Other Ambulatory Visit (HOSPITAL_COMMUNITY): Payer: Self-pay | Admitting: Internal Medicine

## 2013-06-12 DIAGNOSIS — N63 Unspecified lump in unspecified breast: Secondary | ICD-10-CM

## 2013-06-24 ENCOUNTER — Telehealth: Payer: Self-pay | Admitting: Obstetrics & Gynecology

## 2013-06-24 ENCOUNTER — Encounter (INDEPENDENT_AMBULATORY_CARE_PROVIDER_SITE_OTHER): Payer: Self-pay

## 2013-06-24 ENCOUNTER — Ambulatory Visit (INDEPENDENT_AMBULATORY_CARE_PROVIDER_SITE_OTHER): Payer: BC Managed Care – PPO | Admitting: Obstetrics & Gynecology

## 2013-06-24 ENCOUNTER — Encounter: Payer: Self-pay | Admitting: Obstetrics & Gynecology

## 2013-06-24 VITALS — BP 128/70 | Ht 63.0 in | Wt 192.0 lb

## 2013-06-24 DIAGNOSIS — Z01419 Encounter for gynecological examination (general) (routine) without abnormal findings: Secondary | ICD-10-CM

## 2013-06-24 DIAGNOSIS — N951 Menopausal and female climacteric states: Secondary | ICD-10-CM

## 2013-06-24 DIAGNOSIS — Z1212 Encounter for screening for malignant neoplasm of rectum: Secondary | ICD-10-CM

## 2013-06-24 DIAGNOSIS — E1165 Type 2 diabetes mellitus with hyperglycemia: Secondary | ICD-10-CM | POA: Insufficient documentation

## 2013-06-24 DIAGNOSIS — E119 Type 2 diabetes mellitus without complications: Secondary | ICD-10-CM

## 2013-06-24 MED ORDER — ESTRADIOL 0.5 MG/0.5GM TD GEL: 1.0000 | Freq: Every day | TRANSDERMAL | Status: DC

## 2013-06-24 MED ORDER — ESTRADIOL 0.1 MG/GM VA CREA: TOPICAL_CREAM | VAGINAL | Status: DC

## 2013-06-24 NOTE — Progress Notes (Signed)
Patient ID: Sarah [name], female   DOB: 04-Jul-1961, 52 y.o.   MRN: [id number] Subjective:     Sarah [name] is a 52 y.o. female here for a routine exam.  No LMP recorded. Patient has had a hysterectomy. No obstetric history on file. Current complaints: vaginal dryness and dyspareunia.     Gynecologic History No LMP recorded. Patient has had a hysterectomy. Contraception: status post hysterectomy Last Pap: na. Results were: na Last mammogram: 2014. Results were: normal  Past Medical History  Diagnosis Date  . Diabetes mellitus   . Diverticulosis   . Hypercholesteremia   . Anxiety   . GERD (gastroesophageal reflux disease)     Past Surgical History  Procedure Laterality Date  . Abdominal hysterectomy      complete  . Foot surgery      right-tendon repair  . Colonoscopy  01/04/2010    left sided transverse diverticula/two diminutive rectal polyps  . Laparoscopic sigmoid colectomy  02/01/10    Dr Clydene Pugh  . Colonoscopy  03/28/2012    Procedure: COLONOSCOPY;  Surgeon: Corbin Ade, MD;  Location: [facility];  Service: Endoscopy;  Laterality: N/A;  9:30  . Cardiac catheterization  2011    OB History   Grav Para Term Preterm Abortions TAB SAB Ect Mult Living                  History   Social History  . Marital Status: Married    Spouse Name: N/A    Number of Children: 1  . Years of Education: N/A   Occupational History  . K teacher asst    Social History Main Topics  . Smoking status: Former Smoker -- 1.00 packs/day for 10 years    Types: Cigarettes  . Smokeless tobacco: Former Neurosurgeon    Quit date: 03/28/2008  . Alcohol Use: Yes     Comment: occasional  . Drug Use: No  . Sexual Activity: Yes    Birth Control/ Protection: Surgical   Other Topics Concern  . None   Social History Narrative   Lives w/ husband, sister & her children , teen mom & her baby    Family History  Problem Relation Age of Onset  . Colon cancer Maternal Grandfather    . Colon polyps Mother      Review of Systems  Review of Systems  Constitutional: Negative for fever, chills, weight loss, malaise/fatigue and diaphoresis.  HENT: Negative for hearing loss, ear pain, nosebleeds, congestion, sore throat, neck pain, tinnitus and ear discharge.   Eyes: Negative for blurred vision, double vision, photophobia, pain, discharge and redness.  Respiratory: Negative for cough, hemoptysis, sputum production, shortness of breath, wheezing and stridor.   Cardiovascular: Negative for chest pain, palpitations, orthopnea, claudication, leg swelling and PND.  Gastrointestinal: negative for abdominal pain. Negative for heartburn, nausea, vomiting, diarrhea, constipation, blood in stool and melena.  Genitourinary: Negative for dysuria, urgency, frequency, hematuria and flank pain.  Musculoskeletal: Negative for myalgias, back pain, joint pain and falls.  Skin: Negative for itching and rash.  Neurological: Negative for dizziness, tingling, tremors, sensory change, speech change, focal weakness, seizures, loss of consciousness, weakness and headaches.  Endo/Heme/Allergies: Negative for environmental allergies and polydipsia. Does not bruise/bleed easily.  Psychiatric/Behavioral: Negative for depression, suicidal ideas, hallucinations, memory loss and substance abuse. The patient is not nervous/anxious and does not have insomnia.        Objective:    Physical Exam  Vitals reviewed. Constitutional: She is  oriented to person, place, and time. She appears well-developed and well-nourished.  HENT:  Head: Normocephalic and atraumatic.        Right Ear: External ear normal.  Left Ear: External ear normal.  Nose: Nose normal.  Mouth/Throat: Oropharynx is clear and moist.  Eyes: Conjunctivae and EOM are normal. Pupils are equal, round, and reactive to light. Right eye exhibits no discharge. Left eye exhibits no discharge. No scleral icterus.  Neck: Normal range of motion. Neck  supple. No tracheal deviation present. No thyromegaly present.  Cardiovascular: Normal rate, regular rhythm, normal heart sounds and intact distal pulses.  Exam reveals no gallop and no friction rub.   No murmur heard. Respiratory: Effort normal and breath sounds normal. No respiratory distress. She has no wheezes. She has no rales. She exhibits no tenderness.  GI: Soft. Bowel sounds are normal. She exhibits no distension and no mass. There is no tenderness. There is no rebound and no guarding.  Genitourinary:  Breasts no masses skin changes or nipple changes bilaterally      Vulva is normal without lesions Vagina is atrophic Cervix absent Uterus is normal size shape and contour Adnexa is absent Rectal    hemoccult negative, normal tone, no masses  Musculoskeletal: Normal range of motion. She exhibits no edema and no tenderness.  Neurological: She is alert and oriented to person, place, and time. She has normal reflexes. She displays normal reflexes. No cranial nerve deficit. She exhibits normal muscle tone. Coordination normal.  Skin: Skin is warm and dry. No rash noted. No erythema. No pallor.  Psychiatric: She has a normal mood and affect. Her behavior is normal. Judgment and thought content normal.       Assessment:    Menopausal symptoms  Plan:    Hormone replacement therapy: hormone replacement therapy: Divigel. Follow up in: 6 weeks.

## 2013-06-24 NOTE — Telephone Encounter (Signed)
Spoke with pt. Estrace cream is $120.00. Pt needs something cheaper. What do you advise? Uses [facility]. Thanks!!!

## 2013-06-25 ENCOUNTER — Telehealth: Payer: Self-pay | Admitting: Obstetrics & Gynecology

## 2013-06-25 MED ORDER — ESTROGENS, CONJUGATED 0.625 MG/GM VA CREA: 1.0000 | TOPICAL_CREAM | Freq: Every day | VAGINAL | Status: DC

## 2013-06-25 NOTE — Telephone Encounter (Signed)
Pt informed that Premarin vaginal cream had been sent to [facility]. If this is to expensive, there is no other alternative. Pt voiced understanding. JSY

## 2013-06-27 NOTE — Telephone Encounter (Signed)
Sarah [name] sent to Dr.Eure

## 2013-07-01 ENCOUNTER — Telehealth: Payer: Self-pay

## 2013-07-01 ENCOUNTER — Ambulatory Visit (INDEPENDENT_AMBULATORY_CARE_PROVIDER_SITE_OTHER): Payer: BC Managed Care – PPO | Admitting: Obstetrics & Gynecology

## 2013-07-01 ENCOUNTER — Encounter: Payer: Self-pay | Admitting: Obstetrics & Gynecology

## 2013-07-01 VITALS — BP 120/70 | Wt 194.0 lb

## 2013-07-01 DIAGNOSIS — N760 Acute vaginitis: Secondary | ICD-10-CM

## 2013-07-01 DIAGNOSIS — N762 Acute vulvitis: Secondary | ICD-10-CM

## 2013-07-01 NOTE — Progress Notes (Signed)
Patient ID: RISE TRAEGER, female   DOB: 1961/06/19, 52 y.o.   MRN: [id number] Pt had reaction to the premarin cream Exam Slightly red but pt has not used in 5 days  i called Harrold Donath at [facility] and he is going to compound her some estradiol to use vaginally because the estrace is too espensive  Follow up prn

## 2013-08-11 ENCOUNTER — Emergency Department (HOSPITAL_COMMUNITY)
Admission: EM | Admit: 2013-08-11 | Discharge: 2013-08-11 | Disposition: A | Discharge status: Home / Self Care | Payer: BC Managed Care – PPO | Attending: Emergency Medicine | Admitting: Emergency Medicine

## 2013-08-11 ENCOUNTER — Emergency Department (HOSPITAL_COMMUNITY): Payer: BC Managed Care – PPO

## 2013-08-11 ENCOUNTER — Encounter (HOSPITAL_COMMUNITY): Payer: Self-pay | Admitting: Emergency Medicine

## 2013-08-11 DIAGNOSIS — M542 Cervicalgia: Secondary | ICD-10-CM | POA: Insufficient documentation

## 2013-08-11 DIAGNOSIS — R6883 Chills (without fever): Secondary | ICD-10-CM | POA: Insufficient documentation

## 2013-08-11 DIAGNOSIS — K2970 Gastritis, unspecified, without bleeding: Secondary | ICD-10-CM

## 2013-08-11 DIAGNOSIS — Z79899 Other long term (current) drug therapy: Secondary | ICD-10-CM | POA: Insufficient documentation

## 2013-08-11 DIAGNOSIS — Z8659 Personal history of other mental and behavioral disorders: Secondary | ICD-10-CM | POA: Insufficient documentation

## 2013-08-11 DIAGNOSIS — Z95818 Presence of other cardiac implants and grafts: Secondary | ICD-10-CM | POA: Insufficient documentation

## 2013-08-11 DIAGNOSIS — E119 Type 2 diabetes mellitus without complications: Secondary | ICD-10-CM | POA: Insufficient documentation

## 2013-08-11 DIAGNOSIS — IMO0001 Reserved for inherently not codable concepts without codable children: Secondary | ICD-10-CM | POA: Insufficient documentation

## 2013-08-11 DIAGNOSIS — Z91040 Latex allergy status: Secondary | ICD-10-CM | POA: Insufficient documentation

## 2013-08-11 DIAGNOSIS — R109 Unspecified abdominal pain: Secondary | ICD-10-CM

## 2013-08-11 DIAGNOSIS — Z87891 Personal history of nicotine dependence: Secondary | ICD-10-CM | POA: Insufficient documentation

## 2013-08-11 DIAGNOSIS — Z90710 Acquired absence of both cervix and uterus: Secondary | ICD-10-CM | POA: Insufficient documentation

## 2013-08-11 LAB — CBC WITH DIFFERENTIAL/PLATELET
Basophils Relative: 0 % (ref 0–1)
Eosinophils Absolute: 0 10*3/uL (ref 0.0–0.7)
Eosinophils Relative: 1 % (ref 0–5)
HCT: 41.9 % (ref 36.0–46.0)
Hemoglobin: 15 g/dL (ref 12.0–15.0)
MCHC: 35.8 g/dL (ref 30.0–36.0)
MCV: 81.2 fL (ref 78.0–100.0)
Monocytes Absolute: 0.4 10*3/uL (ref 0.1–1.0)
Monocytes Relative: 5 % (ref 3–12)
Neutro Abs: 5 10*3/uL (ref 1.7–7.7)
RBC: 5.16 MIL/uL — ABNORMAL HIGH (ref 3.87–5.11)
RDW: 14.3 % (ref 11.5–15.5)

## 2013-08-11 LAB — URINALYSIS, ROUTINE W REFLEX MICROSCOPIC
Bilirubin Urine: NEGATIVE
Leukocytes, UA: NEGATIVE
Nitrite: NEGATIVE
Specific Gravity, Urine: 1.02 (ref 1.005–1.030)
Urobilinogen, UA: 0.2 mg/dL (ref 0.0–1.0)
pH: 6 (ref 5.0–8.0)

## 2013-08-11 LAB — COMPREHENSIVE METABOLIC PANEL
ALT: 20 U/L (ref 0–35)
AST: 17 U/L (ref 0–37)
Albumin: 3.8 g/dL (ref 3.5–5.2)
Alkaline Phosphatase: 104 U/L (ref 39–117)
Calcium: 9.2 mg/dL (ref 8.4–10.5)
Chloride: 102 mEq/L (ref 96–112)
Potassium: 4 mEq/L (ref 3.5–5.1)
Sodium: 139 mEq/L (ref 135–145)
Total Bilirubin: 0.5 mg/dL (ref 0.3–1.2)
Total Protein: 8.3 g/dL (ref 6.0–8.3)

## 2013-08-11 LAB — LIPASE, BLOOD: Lipase: 38 U/L (ref 11–59)

## 2013-08-11 MED ORDER — IOHEXOL 300 MG/ML  SOLN
100.0000 mL | Freq: Once | INTRAMUSCULAR | Status: AC | PRN
  Administered 2013-08-11: 100 mL via INTRAVENOUS

## 2013-08-11 MED ORDER — HYDROMORPHONE HCL PF 1 MG/ML IJ SOLN
1.0000 mg | Freq: Once | INTRAMUSCULAR | Status: AC
  Administered 2013-08-11: 1 mg via INTRAVENOUS
  Filled 2013-08-11: qty 1

## 2013-08-11 MED ORDER — IOHEXOL 300 MG/ML  SOLN
50.0000 mL | Freq: Once | INTRAMUSCULAR | Status: AC | PRN
  Administered 2013-08-11: 50 mL via ORAL

## 2013-08-11 MED ORDER — PROMETHAZINE HCL 25 MG PO TABS: 25.0000 mg | ORAL_TABLET | Freq: Four times a day (QID) | ORAL | Status: DC | PRN

## 2013-08-11 MED ORDER — ONDANSETRON HCL 4 MG/2ML IJ SOLN
4.0000 mg | Freq: Once | INTRAMUSCULAR | Status: AC
  Administered 2013-08-11: 4 mg via INTRAVENOUS
  Filled 2013-08-11: qty 2

## 2013-08-11 MED ORDER — SODIUM CHLORIDE 0.9 % IV SOLN: INTRAVENOUS | Status: DC

## 2013-08-11 MED ORDER — SODIUM CHLORIDE 0.9 % IJ SOLN
INTRAMUSCULAR | Status: AC
  Filled 2013-08-11: qty 250

## 2013-08-11 MED ORDER — ONDANSETRON 4 MG PO TBDP: 4.0000 mg | ORAL_TABLET | Freq: Three times a day (TID) | ORAL | Status: DC | PRN

## 2013-08-11 MED ORDER — SODIUM CHLORIDE 0.9 % IV BOLUS (SEPSIS)
1000.0000 mL | Freq: Once | INTRAVENOUS | Status: AC
  Administered 2013-08-11: 1000 mL via INTRAVENOUS

## 2013-08-11 MED ORDER — HYDROCODONE-ACETAMINOPHEN 5-325 MG PO TABS: 1.0000 | ORAL_TABLET | Freq: Four times a day (QID) | ORAL | Status: DC | PRN

## 2013-08-11 NOTE — ED Provider Notes (Signed)
CSN: [id number]     Arrival date & time 08/11/13  1900 History  This chart was scribed for Sarah Jakes, MD by Quintella Reichert, ED scribe.  This patient was seen in room [facility] and the patient's care was started at 7:47 PM.   Chief Complaint  Patient presents with  . Sarah [name]    Patient is a 52 y.o. female presenting with cramps. The history is provided by the patient. No language interpreter was used.  Sarah [name] This is a recurrent problem. The current episode started 6 to 12 hours ago. The problem occurs constantly. The problem has been gradually worsening. Associated symptoms include Sarah pain and headaches (mild). Pertinent negatives include no chest pain and no shortness of breath. Exacerbated by: worsened after eating. Relieved by: subsided slightly after taking Pepto Bismol.    HPI Comments: Sarah [name] is a 52 y.o. female with h/o DM, diverticulosis, hypercholesteremia, and anxiety who presents to the Emergency Department complaining of severe generalized Sarah [name] pain.  Pain began this morning at around 9 AM and subsided somewhat after taking Pepto Bismol before worsening again after she ate.  Presently she rates pain at 8 or 9/10.  Pt also complains of associated nausea, a small amount of vomiting, and chills.  In addition she notes [name] pain in her bilateral legs that began 30 minutes ago.  She denies diarrhea, hematemesis, fever, CP, dysuria, hematuria, generalized body aches, or any other associated symptoms..  Pt admits to prior h/o similar Sarah pain "a long time ago" associated with diverticulitis which was treated with a sigmoid colectomy in 2011.  She also has h/o Sarah hysterectomy.  She had a colonoscopy in 2013 with Dr. Jena Gauss.  PCP is FANTA,TESFAYE, MD   Past Medical History  Diagnosis Date  . Diabetes mellitus   . Diverticulosis   . Hypercholesteremia   . Anxiety   . GERD (gastroesophageal reflux disease)      Past Surgical History  Procedure Laterality Date  . Sarah hysterectomy      complete  . Foot surgery      right-tendon repair  . Colonoscopy  01/04/2010    left sided transverse diverticula/two diminutive rectal polyps  . Laparoscopic sigmoid colectomy  02/01/10    Dr Clydene Pugh  . Colonoscopy  03/28/2012    Procedure: COLONOSCOPY;  Surgeon: Corbin Ade, MD;  Location: [facility];  Service: Endoscopy;  Laterality: N/A;  9:30  . Cardiac catheterization  2011    Family History  Problem Relation Age of Onset  . Colon cancer Maternal Grandfather   . Colon polyps Mother     History  Substance Use Topics  . Smoking status: Former Smoker -- 1.00 packs/day for 10 years    Types: Cigarettes  . Smokeless tobacco: Former Neurosurgeon    Quit date: 03/28/2008  . Alcohol Use: Yes     Comment: occasional    OB History   Grav Para Term Preterm Abortions TAB SAB Ect Mult Living                  Review of Systems  Constitutional: Positive for chills. Negative for fever.  HENT: Negative for congestion, rhinorrhea and sore throat.   Eyes: Negative for visual disturbance.  Respiratory: Negative for cough and shortness of breath.   Cardiovascular: Negative for chest pain and leg swelling.  Gastrointestinal: Positive for nausea, vomiting and Sarah pain. Negative for diarrhea.  Genitourinary: Negative for dysuria and hematuria.  Musculoskeletal: Positive for  myalgias (legs) and neck pain. Negative for back pain.  Skin: Negative for rash.  Neurological: Positive for headaches (mild).  Hematological: Does not bruise/bleed easily.  Psychiatric/Behavioral: Negative for confusion.     Allergies  Sulfonamide derivatives and Latex  Home Medications   Current Outpatient Rx  Name  Route  Sig  Dispense  Refill  . albuterol (PROVENTIL) (2.5 MG/3ML) 0.083% nebulizer solution   Nebulization   Take 3 mLs (2.5 mg total) by nebulization every 4 (four) hours as needed for  wheezing.   30 vial   0   . conjugated estrogens (PREMARIN) vaginal cream   Vaginal   Place 1 Applicatorful vaginally daily.   42.5 g   12   . diphenhydrAMINE (BENADRYL) 25 MG tablet   Oral   Take 25 mg by mouth at bedtime as needed for allergies or sleep.          Marland Kitchen estradiol (ESTRACE) 0.1 MG/GM vaginal cream   Vaginal   Place 1 Applicatorful vaginally every other day.         . INVOKANA 100 MG TABS   Oral   Take 100 mg by mouth daily.          Marland Kitchen LANTUS SOLOSTAR 100 UNIT/ML injection   Subcutaneous   Inject 30 Units into the skin at bedtime.          . metFORMIN (GLUCOPHAGE) 500 MG tablet   Oral   Take 500 mg by mouth 2 (two) times daily with a meal.         . polyethylene glycol powder (GLYCOLAX/MIRALAX) powder   Oral   Take 17 g by mouth daily as needed (for constipation).         Marland Kitchen HYDROcodone-acetaminophen (NORCO/VICODIN) 5-325 MG per tablet   Oral   Take 1-2 tablets by mouth every 6 (six) hours as needed for moderate pain.   20 tablet   0   . ondansetron (ZOFRAN ODT) 4 MG disintegrating tablet   Oral   Take 1 tablet (4 mg total) by mouth every 8 (eight) hours as needed.   10 tablet   0   . promethazine (PHENERGAN) 25 MG tablet   Oral   Take 1 tablet (25 mg total) by mouth every 6 (six) hours as needed for nausea or vomiting.   12 tablet   0    BP 109/75  Pulse 95  Temp(Src) 98.6 F (37 C)  Resp 20  Ht 5' 3.5" (1.613 m)  Wt 190 lb (86.183 kg)  BMI 33.12 kg/m2  SpO2 98%  Physical Exam  Nursing note and vitals reviewed. Constitutional: She is oriented to person, place, and time. She appears well-developed and well-nourished. No distress.  HENT:  Head: Normocephalic and atraumatic.  Mouth/Throat: Mucous membranes are not dry.  Eyes: Conjunctivae and EOM are normal. No scleral icterus.  Neck: Neck supple. No tracheal deviation present.  Cardiovascular: Normal rate, regular rhythm and normal heart sounds.   No murmur  heard. Pulmonary/Chest: Effort normal and breath sounds normal. No respiratory distress. She has no wheezes. She has no rales.  Sarah: Bowel sounds are decreased. There is no tenderness.  Musculoskeletal: Normal range of motion.  Neurological: She is alert and oriented to person, place, and time.  Skin: Skin is warm and dry.  Psychiatric: She has a normal mood and affect. Her behavior is normal.    ED Course  Procedures (including critical care time)  DIAGNOSTIC STUDIES: Oxygen Saturation is 98% on room air,  normal by my interpretation.    COORDINATION OF CARE: 7:53 PM-Discussed treatment plan which includes IV fluids, pain medication, anti-emetics, CT abdomen and labs with pt at bedside and pt agreed to plan.    Labs Review Labs Reviewed  URINALYSIS, ROUTINE W REFLEX MICROSCOPIC - Abnormal; Notable for the following:    Glucose, UA 500 (*)    Ketones, ur 15 (*)    All other components within normal limits  COMPREHENSIVE METABOLIC PANEL - Abnormal; Notable for the following:    Glucose, Bld 138 (*)    All other components within normal limits  CBC WITH DIFFERENTIAL - Abnormal; Notable for the following:    RBC 5.16 (*)    All other components within normal limits  LIPASE, BLOOD   Results for orders placed during the hospital encounter of 08/11/13  URINALYSIS, ROUTINE W REFLEX MICROSCOPIC      Result Value Range   Color, Urine YELLOW  YELLOW   APPearance CLEAR  CLEAR   Specific Gravity, Urine 1.020  1.005 - 1.030   pH 6.0  5.0 - 8.0   Glucose, UA 500 (*) NEGATIVE mg/dL   Hgb urine dipstick NEGATIVE  NEGATIVE   Bilirubin Urine NEGATIVE  NEGATIVE   Ketones, ur 15 (*) NEGATIVE mg/dL   Protein, ur NEGATIVE  NEGATIVE mg/dL   Urobilinogen, UA 0.2  0.0 - 1.0 mg/dL   Nitrite NEGATIVE  NEGATIVE   Leukocytes, UA NEGATIVE  NEGATIVE  COMPREHENSIVE METABOLIC PANEL      Result Value Range   Sodium 139  135 - 145 mEq/L   Potassium 4.0  3.5 - 5.1 mEq/L   Chloride 102  96 - 112  mEq/L   CO2 26  19 - 32 mEq/L   Glucose, Bld 138 (*) 70 - 99 mg/dL   BUN 18  6 - 23 mg/dL   Creatinine, Ser 4.78  0.50 - 1.10 mg/dL   Calcium 9.2  8.4 - 29.5 mg/dL   Total Protein 8.3  6.0 - 8.3 g/dL   Albumin 3.8  3.5 - 5.2 g/dL   AST 17  0 - 37 U/L   ALT 20  0 - 35 U/L   Alkaline Phosphatase 104  39 - 117 U/L   Total Bilirubin 0.5  0.3 - 1.2 mg/dL   GFR calc non Af Amer >90  >90 mL/min   GFR calc Af Amer >90  >90 mL/min  LIPASE, BLOOD      Result Value Range   Lipase 38  11 - 59 U/L  CBC WITH DIFFERENTIAL      Result Value Range   WBC 7.1  4.0 - 10.5 K/uL   RBC 5.16 (*) 3.87 - 5.11 MIL/uL   Hemoglobin 15.0  12.0 - 15.0 g/dL   HCT 62.1  30.8 - 65.7 %   MCV 81.2  78.0 - 100.0 fL   MCH 29.1  26.0 - 34.0 pg   MCHC 35.8  30.0 - 36.0 g/dL   RDW 84.6  96.2 - 95.2 %   Platelets 297  150 - 400 K/uL   Neutrophils Relative % 69  43 - 77 %   Neutro Abs 5.0  1.7 - 7.7 K/uL   Lymphocytes Relative 25  12 - 46 %   Lymphs Abs 1.8  0.7 - 4.0 K/uL   Monocytes Relative 5  3 - 12 %   Monocytes Absolute 0.4  0.1 - 1.0 K/uL   Eosinophils Relative 1  0 - 5 %  Eosinophils Absolute 0.0  0.0 - 0.7 K/uL   Basophils Relative 0  0 - 1 %   Basophils Absolute 0.0  0.0 - 0.1 K/uL     Imaging Review Ct Abdomen Pelvis W Contrast  08/11/2013   CLINICAL DATA:  Sarah [name].  Sarah pain.  EXAM: CT ABDOMEN AND PELVIS WITH CONTRAST  TECHNIQUE: Multidetector CT imaging of the abdomen and pelvis was performed using the standard protocol following bolus administration of intravenous contrast.  CONTRAST:  50mL OMNIPAQUE IOHEXOL 300 MG/ML SOLN, OMNIPAQUE IOHEXOL 300 MG/ML SOLN  COMPARISON:  12/07/2009.  FINDINGS: Lung Bases: Dependent atelectasis.  Liver:  Normal.  Spleen:  Normal.  Gallbladder:  Normal.  No calcified stones.  Common bile duct:  Normal.  Pancreas:  Normal.  Adrenal glands:  Normal.  Kidneys: Normal renal enhancement. Delayed images show good excretion of contrast from both kidneys.  Both ureters appear within normal limits.  Stomach:  Distended with oral contrast.  No inflammatory changes.  Small bowel: Normal duodenum. Negative for obstruction. Small bowel mesentery appears normal. Small bowel contrast is in the mid jejunum.  Colon: Colonic diverticulosis. Sigmoid colon postoperative changes. No complicating features.  Pelvic Genitourinary: Normal appearance of the urinary bladder. Hysterectomy.  Bones: No aggressive osseous lesion. 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  Vasculature: Atherosclerosis.  No acute vascular abnormality.  Body Wall: Fat containing periumbilical hernia.  IMPRESSION: No acute abnormality. Postsurgical changes of the sigmoid colon are new since the prior examination. Hysterectomy.   Electronically Signed   By: Andreas Newport M.D.   On: 08/11/2013 21:31     EKG Interpretation   None       MDM   1. Sarah pain   2. Gastritis    Workup for the crampy Sarah pain without any significant findings. No evidence of bowel obstruction no evidence of recurrent diverticulitis. Patient without leukocytosis no evidence of pancreatitis no evidence of any gallbladder problems no evidence urinary tract infection. Suspected maybe a viral based gastritis. We'll treat with antinausea medicine pain medicine and followup with her doctor.    I personally performed the services described in this documentation, which was scribed in my presence. The recorded information has been reviewed and is accurate.     Sarah Jakes, MD 08/11/13 2151

## 2013-08-11 NOTE — ED Notes (Addendum)
Pt c/o lower abdominal cramping and weakness since earlier today. Pt also bilateral leg cramping.

## 2013-08-12 ENCOUNTER — Ambulatory Visit: Payer: BC Managed Care – PPO | Admitting: Obstetrics & Gynecology

## 2013-09-12 NOTE — Telephone Encounter (Signed)
Closed encounter °

## 2013-09-24 ENCOUNTER — Emergency Department (HOSPITAL_COMMUNITY)
Admission: EM | Admit: 2013-09-24 | Discharge: 2013-09-24 | Disposition: A | Discharge status: Home / Self Care | Payer: BC Managed Care – PPO | Attending: Emergency Medicine | Admitting: Emergency Medicine

## 2013-09-24 ENCOUNTER — Encounter (HOSPITAL_COMMUNITY): Payer: Self-pay | Admitting: Emergency Medicine

## 2013-09-24 DIAGNOSIS — R110 Nausea: Secondary | ICD-10-CM | POA: Insufficient documentation

## 2013-09-24 DIAGNOSIS — E119 Type 2 diabetes mellitus without complications: Secondary | ICD-10-CM | POA: Insufficient documentation

## 2013-09-24 DIAGNOSIS — Z91040 Latex allergy status: Secondary | ICD-10-CM | POA: Insufficient documentation

## 2013-09-24 DIAGNOSIS — Z794 Long term (current) use of insulin: Secondary | ICD-10-CM | POA: Insufficient documentation

## 2013-09-24 DIAGNOSIS — Z8659 Personal history of other mental and behavioral disorders: Secondary | ICD-10-CM | POA: Insufficient documentation

## 2013-09-24 DIAGNOSIS — Z79899 Other long term (current) drug therapy: Secondary | ICD-10-CM | POA: Insufficient documentation

## 2013-09-24 DIAGNOSIS — J111 Influenza due to unidentified influenza virus with other respiratory manifestations: Secondary | ICD-10-CM | POA: Insufficient documentation

## 2013-09-24 DIAGNOSIS — R197 Diarrhea, unspecified: Secondary | ICD-10-CM | POA: Insufficient documentation

## 2013-09-24 DIAGNOSIS — Z8719 Personal history of other diseases of the digestive system: Secondary | ICD-10-CM | POA: Insufficient documentation

## 2013-09-24 DIAGNOSIS — Z87891 Personal history of nicotine dependence: Secondary | ICD-10-CM | POA: Insufficient documentation

## 2013-09-24 LAB — GLUCOSE, CAPILLARY: Glucose-Capillary: 113 mg/dL — ABNORMAL HIGH (ref 70–99)

## 2013-09-24 MED ORDER — ONDANSETRON HCL 4 MG PO TABS: 4.0000 mg | ORAL_TABLET | Freq: Four times a day (QID) | ORAL | Status: DC

## 2013-09-24 MED ORDER — ACETAMINOPHEN 325 MG PO TABS
ORAL_TABLET | ORAL | Status: AC
  Administered 2013-09-24: 21:00:00 650 mg via ORAL
  Filled 2013-09-24: qty 2

## 2013-09-24 MED ORDER — ACETAMINOPHEN 325 MG PO TABS
650.0000 mg | ORAL_TABLET | Freq: Once | ORAL | Status: AC
  Administered 2013-09-24: 650 mg via ORAL

## 2013-09-24 MED ORDER — ONDANSETRON 8 MG PO TBDP
8.0000 mg | ORAL_TABLET | Freq: Once | ORAL | Status: AC
  Administered 2013-09-24: 8 mg via ORAL
  Filled 2013-09-24: qty 1

## 2013-09-24 MED ORDER — IBUPROFEN 800 MG PO TABS: 800.0000 mg | ORAL_TABLET | Freq: Three times a day (TID) | ORAL | Status: DC

## 2013-09-24 MED ORDER — OSELTAMIVIR PHOSPHATE 75 MG PO CAPS: 75.0000 mg | ORAL_CAPSULE | Freq: Two times a day (BID) | ORAL | Status: DC

## 2013-09-24 MED ORDER — KETOROLAC TROMETHAMINE 60 MG/2ML IM SOLN
60.0000 mg | Freq: Once | INTRAMUSCULAR | Status: AC
  Administered 2013-09-24: 60 mg via INTRAMUSCULAR
  Filled 2013-09-24: qty 2

## 2013-09-24 MED ORDER — FLUTICASONE PROPIONATE 50 MCG/ACT NA SUSP: 1.0000 | Freq: Every day | NASAL | Status: DC

## 2013-09-24 NOTE — ED Provider Notes (Signed)
CSN: [id number]     Arrival date & time 09/24/13  1913 History   This chart was scribed for Orpah Greek, MD by Elby Beck, ED Scribe. This patient was seen in room APA08/APA08 and the patient's care was started at 9:16 PM.   Chief Complaint  Patient presents with  . Fever  . Generalized Body Aches    The history is provided by the patient. No language interpreter was used.    HPI Comments: Sarah [name] is a 53 y.o. female who presents to the Emergency Department complaining of a fever over the past 2 days. ED temperature is 99.9 F. She reports that she has taken Tylenol with mild relief of her fever. She reports associated chills, headache, generalized myalgias, congestion, an occasional cough, and bilateral ear fullness over the past 2 days. She further reports associated intermittent nausea that is worsened with eating and a single episode of diarrhea today which she attributes to taking Tylenol. She deneis abdominal pain, vomiting or any other symptoms.   Past Medical History  Diagnosis Date  . Diabetes mellitus   . Diverticulosis   . Hypercholesteremia   . Anxiety   . GERD (gastroesophageal reflux disease)    Past Surgical History  Procedure Laterality Date  . Abdominal hysterectomy      complete  . Foot surgery      right-tendon repair  . Colonoscopy  01/04/2010    left sided transverse diverticula/two diminutive rectal polyps  . Laparoscopic sigmoid colectomy  02/01/10    Dr Jola Babinski  . Colonoscopy  03/28/2012    Procedure: COLONOSCOPY;  Surgeon: Daneil Dolin, MD;  Location: [facility];  Service: Endoscopy;  Laterality: N/A;  9:30  . Cardiac catheterization  2011   Family History  Problem Relation Age of Onset  . Colon cancer Maternal Grandfather   . Colon polyps Mother    History  Substance Use Topics  . Smoking status: Former Smoker -- 1.00 packs/day for 10 years    Types: Cigarettes  . Smokeless tobacco: Former Systems developer    Quit date:  03/28/2008  . Alcohol Use: Yes     Comment: occasional   OB History   Grav Para Term Preterm Abortions TAB SAB Ect Mult Living                 Review of Systems  Constitutional: Positive for fever and chills.  HENT: Positive for congestion.        Bilateral ear fullness  Respiratory: Positive for cough.   Gastrointestinal: Positive for nausea and diarrhea. Negative for vomiting and abdominal pain.  Musculoskeletal: Positive for myalgias (generalized).  Neurological: Positive for headaches.  All other systems reviewed and are negative.    Allergies  Sulfonamide derivatives and Latex  Home Medications   Current Outpatient Rx  Name  Route  Sig  Dispense  Refill  . albuterol (PROVENTIL) (2.5 MG/3ML) 0.083% nebulizer solution   Nebulization   Take 3 mLs (2.5 mg total) by nebulization every 4 (four) hours as needed for wheezing.   30 vial   0   . conjugated estrogens (PREMARIN) vaginal cream   Vaginal   Place 1 Applicatorful vaginally daily.   42.5 g   12   . diphenhydrAMINE (BENADRYL) 25 MG tablet   Oral   Take 25 mg by mouth at bedtime as needed for allergies or sleep.          Marland Kitchen estradiol (ESTRACE) 0.1 MG/GM vaginal cream   Vaginal  Place 1 Applicatorful vaginally every other day.         Marland Kitchen HYDROcodone-acetaminophen (NORCO/VICODIN) 5-325 MG per tablet   Oral   Take 1-2 tablets by mouth every 6 (six) hours as needed for moderate pain.   20 tablet   0   . INVOKANA 100 MG TABS   Oral   Take 100 mg by mouth daily.          Marland Kitchen LANTUS SOLOSTAR 100 UNIT/ML injection   Subcutaneous   Inject 30 Units into the skin at bedtime.          . metFORMIN (GLUCOPHAGE) 500 MG tablet   Oral   Take 500 mg by mouth 2 (two) times daily with a meal.         . ondansetron (ZOFRAN ODT) 4 MG disintegrating tablet   Oral   Take 1 tablet (4 mg total) by mouth every 8 (eight) hours as needed.   10 tablet   0   . polyethylene glycol powder (GLYCOLAX/MIRALAX) powder    Oral   Take 17 g by mouth daily as needed (for constipation).         . promethazine (PHENERGAN) 25 MG tablet   Oral   Take 1 tablet (25 mg total) by mouth every 6 (six) hours as needed for nausea or vomiting.   12 tablet   0    Triage Vitals: BP 106/60  Pulse 102  Temp(Src) 99.9 F (37.7 C)  Resp 20  Ht 5\' 3"  (1.6 m)  Wt 190 lb (86.183 kg)  BMI 33.67 kg/m2  SpO2 94%  Physical Exam  Constitutional: She is oriented to person, place, and time. She appears well-developed and well-nourished. No distress.  HENT:  Head: Normocephalic and atraumatic.  Right Ear: Hearing normal.  Left Ear: Hearing normal.  Nose: Nose normal.  Mouth/Throat: Oropharynx is clear and moist and mucous membranes are normal.  Eyes: Conjunctivae and EOM are normal. Pupils are equal, round, and reactive to light.  Neck: Normal range of motion. Neck supple.  Cardiovascular: Regular rhythm, S1 normal and S2 normal.  Exam reveals no gallop and no friction rub.   No murmur heard. Pulmonary/Chest: Effort normal and breath sounds normal. No respiratory distress. She exhibits no tenderness.  Abdominal: Soft. Normal appearance and bowel sounds are normal. There is no hepatosplenomegaly. There is no tenderness. There is no rebound, no guarding, no tenderness at McBurney's point and negative Murphy's sign. No hernia.  Musculoskeletal: Normal range of motion.  Neurological: She is alert and oriented to person, place, and time. She has normal strength. No cranial nerve deficit or sensory deficit. Coordination normal. GCS eye subscore is 4. GCS verbal subscore is 5. GCS motor subscore is 6.  Skin: Skin is warm, dry and intact. No rash noted. No cyanosis.  Psychiatric: She has a normal mood and affect. Her speech is normal and behavior is normal. Thought content normal.    ED Course  Procedures (including critical care time)  DIAGNOSTIC STUDIES: Oxygen Saturation is 94% on RA, normal by my interpretation.     COORDINATION OF CARE: 9:19 PM- Discussed plan to order Zofran and Tylenol. Pt advised of plan for treatment and pt agrees.  Labs Review Labs Reviewed - No data to display Imaging Review No results found.  EKG Interpretation   None       MDM   1. Flu syndrome    Presents to ER with fever, chills, nasal congestion, headache, cough, nausea and diarrhea. This  is consistent with viral syndrome, including influenza. Patient will be treated symptomatically. She does not appear septic. Neurologic exam is normal. Lungs are clear, no clinical concern for pneumonia.   I personally performed the services described in this documentation, which was scribed in my presence. The recorded information has been reviewed and is accurate.     Orpah Greek, MD 09/24/13 2136

## 2013-09-24 NOTE — ED Notes (Signed)
Pt with fever, body aches, chills, diarrhea onset yesterday

## 2013-09-24 NOTE — Discharge Instructions (Signed)

## 2013-09-27 ENCOUNTER — Emergency Department (HOSPITAL_COMMUNITY)
Admission: EM | Admit: 2013-09-27 | Discharge: 2013-09-27 | Disposition: A | Discharge status: Home / Self Care | Payer: BC Managed Care – PPO | Attending: Emergency Medicine | Admitting: Emergency Medicine

## 2013-09-27 ENCOUNTER — Encounter (HOSPITAL_COMMUNITY): Payer: Self-pay | Admitting: Emergency Medicine

## 2013-09-27 DIAGNOSIS — E119 Type 2 diabetes mellitus without complications: Secondary | ICD-10-CM | POA: Insufficient documentation

## 2013-09-27 DIAGNOSIS — R509 Fever, unspecified: Secondary | ICD-10-CM | POA: Insufficient documentation

## 2013-09-27 DIAGNOSIS — Z87891 Personal history of nicotine dependence: Secondary | ICD-10-CM | POA: Insufficient documentation

## 2013-09-27 DIAGNOSIS — H6691 Otitis media, unspecified, right ear: Secondary | ICD-10-CM

## 2013-09-27 DIAGNOSIS — R059 Cough, unspecified: Secondary | ICD-10-CM | POA: Insufficient documentation

## 2013-09-27 DIAGNOSIS — H6690 Otitis media, unspecified, unspecified ear: Secondary | ICD-10-CM | POA: Insufficient documentation

## 2013-09-27 DIAGNOSIS — R05 Cough: Secondary | ICD-10-CM | POA: Insufficient documentation

## 2013-09-27 DIAGNOSIS — Z79899 Other long term (current) drug therapy: Secondary | ICD-10-CM | POA: Insufficient documentation

## 2013-09-27 DIAGNOSIS — IMO0002 Reserved for concepts with insufficient information to code with codable children: Secondary | ICD-10-CM | POA: Insufficient documentation

## 2013-09-27 DIAGNOSIS — Z794 Long term (current) use of insulin: Secondary | ICD-10-CM | POA: Insufficient documentation

## 2013-09-27 DIAGNOSIS — Z792 Long term (current) use of antibiotics: Secondary | ICD-10-CM | POA: Insufficient documentation

## 2013-09-27 DIAGNOSIS — Z8719 Personal history of other diseases of the digestive system: Secondary | ICD-10-CM | POA: Insufficient documentation

## 2013-09-27 DIAGNOSIS — F411 Generalized anxiety disorder: Secondary | ICD-10-CM | POA: Insufficient documentation

## 2013-09-27 MED ORDER — AMOXICILLIN 250 MG PO CAPS
500.0000 mg | ORAL_CAPSULE | Freq: Once | ORAL | Status: AC
  Administered 2013-09-27: 500 mg via ORAL
  Filled 2013-09-27: qty 2

## 2013-09-27 MED ORDER — AMOXICILLIN 500 MG PO CAPS: 500.0000 mg | ORAL_CAPSULE | Freq: Three times a day (TID) | ORAL | Status: DC

## 2013-09-27 MED ORDER — ANTIPYRINE-BENZOCAINE 5.4-1.4 % OT SOLN
3.0000 [drp] | Freq: Once | OTIC | Status: AC
  Administered 2013-09-27: 4 [drp] via OTIC
  Filled 2013-09-27: qty 10

## 2013-09-27 MED ORDER — HYDROCODONE-ACETAMINOPHEN 5-325 MG PO TABS: 1.0000 | ORAL_TABLET | ORAL | Status: DC | PRN

## 2013-09-27 NOTE — ED Provider Notes (Signed)
CSN: [id number]     Arrival date & time 09/27/13  1634 History   First MD Initiated Contact with Patient 09/27/13 1811     Chief Complaint  Patient presents with  . Otalgia   (Consider location/radiation/quality/duration/timing/severity/associated sxs/prior Treatment) HPI Comments: Sarah [name] is a 53 y.o. Female currently being treated for flu like symptoms after being seeing here 3 days ago for symptoms including fever, chills, headache, myalgias and nasal congestion along with sneezing and bilateral ear pressure.  She sneezed this am while at work and had significantly intensified pain in her right ear which has been persistent without radiation or drainage from the ear but with increased muffled hearing acuity.  She has taken ibuprofen without relief of pain.  She is currently on tamiflu for her flu like symptoms.  She has clear nasal discharge along with nasal congestion.  She denies shortness of breath, headache, dizziness,  Has occasional non productive cough.       The history is provided by the patient.    Past Medical History  Diagnosis Date  . Diabetes mellitus   . Diverticulosis   . Hypercholesteremia   . Anxiety   . GERD (gastroesophageal reflux disease)    Past Surgical History  Procedure Laterality Date  . Abdominal hysterectomy      complete  . Foot surgery      right-tendon repair  . Colonoscopy  01/04/2010    left sided transverse diverticula/two diminutive rectal polyps  . Laparoscopic sigmoid colectomy  02/01/10    Dr Jola Babinski  . Colonoscopy  03/28/2012    Procedure: COLONOSCOPY;  Surgeon: Daneil Dolin, MD;  Location: [facility];  Service: Endoscopy;  Laterality: N/A;  9:30  . Cardiac catheterization  2011   Family History  Problem Relation Age of Onset  . Colon cancer Maternal Grandfather   . Colon polyps Mother    History  Substance Use Topics  . Smoking status: Former Smoker -- 1.00 packs/day for 10 years    Types: Cigarettes  .  Smokeless tobacco: Former Systems developer    Quit date: 03/28/2008  . Alcohol Use: Yes     Comment: occasional   OB History   Grav Para Term Preterm Abortions TAB SAB Ect Mult Living                 Review of Systems  Constitutional: Positive for fever and chills.  HENT: Positive for congestion, ear pain, hearing loss, rhinorrhea and sinus pressure. Negative for facial swelling, sore throat, tinnitus, trouble swallowing and voice change.   Eyes: Negative for discharge.  Respiratory: Positive for cough. Negative for shortness of breath, wheezing and stridor.   Cardiovascular: Negative for chest pain.  Gastrointestinal: Negative for abdominal pain.  Genitourinary: Negative.     Allergies  Sulfonamide derivatives and Latex  Home Medications   Current Outpatient Rx  Name  Route  Sig  Dispense  Refill  . albuterol (PROVENTIL) (2.5 MG/3ML) 0.083% nebulizer solution   Nebulization   Take 3 mLs (2.5 mg total) by nebulization every 4 (four) hours as needed for wheezing.   30 vial   0   . antipyrine-benzocaine (AURALGAN) otic solution   Right Ear   Place 3-4 drops into the right ear every 2 (two) hours as needed for ear pain.         . diphenhydrAMINE (BENADRYL) 25 MG tablet   Oral   Take 25 mg by mouth at bedtime as needed for allergies or sleep.          Marland Kitchen  fluticasone (FLONASE) 50 MCG/ACT nasal spray   Each Nare   Place 1 spray into both nostrils daily.   16 g   0   . ibuprofen (ADVIL,MOTRIN) 800 MG tablet   Oral   Take 1 tablet (800 mg total) by mouth 3 (three) times daily.   21 tablet   0   . INVOKANA 100 MG TABS   Oral   Take 100 mg by mouth daily.          Marland Kitchen LANTUS SOLOSTAR 100 UNIT/ML injection   Subcutaneous   Inject 30 Units into the skin at bedtime.          . metFORMIN (GLUCOPHAGE) 500 MG tablet   Oral   Take 500 mg by mouth 2 (two) times daily with a meal.         . ondansetron (ZOFRAN) 4 MG tablet   Oral   Take 1 tablet (4 mg total) by mouth every  6 (six) hours.   12 tablet   0   . oseltamivir (TAMIFLU) 75 MG capsule   Oral   Take 1 capsule (75 mg total) by mouth every 12 (twelve) hours.   10 capsule   0   . polyethylene glycol powder (GLYCOLAX/MIRALAX) powder   Oral   Take 17 g by mouth daily as needed (for constipation).         Marland Kitchen amoxicillin (AMOXIL) 500 MG capsule   Oral   Take 1 capsule (500 mg total) by mouth 3 (three) times daily.   30 capsule   0   . estradiol (ESTRACE) 0.1 MG/GM vaginal cream   Vaginal   Place 1 Applicatorful vaginally every other day.         Marland Kitchen HYDROcodone-acetaminophen (NORCO/VICODIN) 5-325 MG per tablet   Oral   Take 1 tablet by mouth every 4 (four) hours as needed for moderate pain.   15 tablet   0    BP 130/81  Pulse 73  Temp(Src) 97.8 F (36.6 C) (Oral)  Resp 20  Ht 5\' 3"  (1.6 m)  Wt 190 lb (86.183 kg)  BMI 33.67 kg/m2  SpO2 99% Physical Exam  Constitutional: She is oriented to person, place, and time. She appears well-developed and well-nourished.  HENT:  Head: Normocephalic and atraumatic.  Right Ear: Ear canal normal. No mastoid tenderness. Tympanic membrane is injected and bulging. No middle ear effusion.  Left Ear: External ear and ear canal normal. Tympanic membrane is not injected and not bulging.  No middle ear effusion.  Nose: Mucosal edema and rhinorrhea present.  Mouth/Throat: Uvula is midline, oropharynx is clear and moist and mucous membranes are normal. No oropharyngeal exudate, posterior oropharyngeal edema, posterior oropharyngeal erythema or tonsillar abscesses.  Eyes: Conjunctivae are normal.  Cardiovascular: Normal rate and normal heart sounds.   Pulmonary/Chest: Effort normal. No respiratory distress. She has no wheezes. She has no rales.  Abdominal: Soft. There is no tenderness.  Musculoskeletal: Normal range of motion.  Neurological: She is alert and oriented to person, place, and time.  Skin: Skin is warm and dry. No rash noted.  Psychiatric: She  has a normal mood and affect.    ED Course  Procedures (including critical care time) Labs Review Labs Reviewed - No data to display Imaging Review No results found.  EKG Interpretation   None       MDM   1. Otitis media of right ear    Auralgan instilled in right ear without pain relief.  She was  prescribed amoxil and hydrocodone.  Encouraged recheck by pcp if not improved or return here for worse sx.  The patient appears reasonably screened and/or stabilized for discharge and I doubt any other medical condition or other [facility] requiring further screening, evaluation, or treatment in the ED at this time prior to discharge.     Evalee Jefferson, PA-C 09/27/13 1901

## 2013-09-27 NOTE — Discharge Instructions (Signed)
Otitis Media, Adult Otitis media is redness, soreness, and swelling (inflammation) of the middle ear. Otitis media may be caused by allergies or, most commonly, by infection. Often it occurs as a complication of the common cold. SIGNS AND SYMPTOMS Symptoms of otitis media may include:  Earache.  Fever.  Ringing in your ear.  Headache.  Leakage of fluid from the ear. DIAGNOSIS To diagnose otitis media, your health care provider will examine your ear with an otoscope. This is an instrument that allows your health care provider to see into your ear in order to examine your eardrum. Your health care provider also will ask you questions about your symptoms. TREATMENT  Typically, otitis media resolves on its own within 3 5 days. Your health care provider may prescribe medicine to ease your symptoms of pain. If otitis media does not resolve within 5 days or is recurrent, your health care provider may prescribe antibiotic medicines if he or she suspects that a bacterial infection is the cause. HOME CARE INSTRUCTIONS   Take your medicine as directed until it is gone, even if you feel better after the first few days.  Only take over-the-counter or prescription medicines for pain, discomfort, or fever as directed by your health care provider.  Follow up with your health care provider as directed. SEEK MEDICAL CARE IF:  You have otitis media only in one ear or bleeding from your nose or both.  You notice a lump on your neck.  You are not getting better in 3 5 days.  You feel worse instead of better. SEEK IMMEDIATE MEDICAL CARE IF:   You have pain that is not controlled with medicine.  You have swelling, redness, or pain around your ear or stiffness in your neck.  You notice that part of your face is paralyzed.  You notice that the bone behind your ear (mastoid) is tender when you touch it. MAKE SURE YOU:   Understand these instructions.  Will watch your condition.  Will get help  right away if you are not doing well or get worse. Document Released: 05/13/2004 Document Revised: 05/29/2013 Document Reviewed: 03/05/2013 [facility] Patient Information 2014 [place], [place].   Take your entire course of antibiotics as prescribed.  Do not drive within 4 hours of taking hydrocodone as this medication will make you sleepy.

## 2013-09-27 NOTE — ED Notes (Signed)
Patient c/o right ear pain. Per patient "had nurse at school look in it and was told she had fluid in it." Patient reports sneezing and making pain worse. Patient states "I can't hardly hear out of it now." Denies any drainage.

## 2013-09-27 NOTE — ED Notes (Signed)
Rt ear pain, onset today,

## 2013-09-28 NOTE — ED Provider Notes (Signed)
Medical screening examination/treatment/procedure(s) were performed by non-physician practitioner and as supervising physician I was immediately available for consultation/collaboration.  EKG Interpretation   None         Mervin Kung, MD 09/28/13 [phone]

## 2014-01-01 ENCOUNTER — Emergency Department (HOSPITAL_COMMUNITY)
Admission: EM | Admit: 2014-01-01 | Discharge: 2014-01-02 | Disposition: A | Discharge status: Home / Self Care | Payer: BC Managed Care – PPO | Attending: Emergency Medicine | Admitting: Emergency Medicine

## 2014-01-01 ENCOUNTER — Encounter (HOSPITAL_COMMUNITY): Payer: Self-pay | Admitting: Emergency Medicine

## 2014-01-01 ENCOUNTER — Emergency Department (HOSPITAL_COMMUNITY): Payer: BC Managed Care – PPO

## 2014-01-01 DIAGNOSIS — Z8719 Personal history of other diseases of the digestive system: Secondary | ICD-10-CM | POA: Insufficient documentation

## 2014-01-01 DIAGNOSIS — Z794 Long term (current) use of insulin: Secondary | ICD-10-CM | POA: Insufficient documentation

## 2014-01-01 DIAGNOSIS — R51 Headache: Secondary | ICD-10-CM | POA: Insufficient documentation

## 2014-01-01 DIAGNOSIS — Z91040 Latex allergy status: Secondary | ICD-10-CM | POA: Insufficient documentation

## 2014-01-01 DIAGNOSIS — IMO0001 Reserved for inherently not codable concepts without codable children: Secondary | ICD-10-CM | POA: Insufficient documentation

## 2014-01-01 DIAGNOSIS — Z792 Long term (current) use of antibiotics: Secondary | ICD-10-CM | POA: Insufficient documentation

## 2014-01-01 DIAGNOSIS — J209 Acute bronchitis, unspecified: Secondary | ICD-10-CM | POA: Insufficient documentation

## 2014-01-01 DIAGNOSIS — IMO0002 Reserved for concepts with insufficient information to code with codable children: Secondary | ICD-10-CM | POA: Insufficient documentation

## 2014-01-01 DIAGNOSIS — Z90710 Acquired absence of both cervix and uterus: Secondary | ICD-10-CM | POA: Insufficient documentation

## 2014-01-01 DIAGNOSIS — Z79899 Other long term (current) drug therapy: Secondary | ICD-10-CM | POA: Insufficient documentation

## 2014-01-01 DIAGNOSIS — J40 Bronchitis, not specified as acute or chronic: Secondary | ICD-10-CM

## 2014-01-01 DIAGNOSIS — E119 Type 2 diabetes mellitus without complications: Secondary | ICD-10-CM | POA: Insufficient documentation

## 2014-01-01 DIAGNOSIS — Z87891 Personal history of nicotine dependence: Secondary | ICD-10-CM | POA: Insufficient documentation

## 2014-01-01 DIAGNOSIS — Z8659 Personal history of other mental and behavioral disorders: Secondary | ICD-10-CM | POA: Insufficient documentation

## 2014-01-01 MED ORDER — ALBUTEROL SULFATE (2.5 MG/3ML) 0.083% IN NEBU
2.5000 mg | INHALATION_SOLUTION | Freq: Once | RESPIRATORY_TRACT | Status: AC
  Administered 2014-01-01: 2.5 mg via RESPIRATORY_TRACT
  Filled 2014-01-01: qty 3

## 2014-01-01 MED ORDER — IPRATROPIUM-ALBUTEROL 0.5-2.5 (3) MG/3ML IN SOLN
3.0000 mL | Freq: Once | RESPIRATORY_TRACT | Status: AC
  Administered 2014-01-01: 3 mL via RESPIRATORY_TRACT
  Filled 2014-01-01: qty 3

## 2014-01-01 MED ORDER — HYDROCOD POLST-CHLORPHEN POLST 10-8 MG/5ML PO LQCR
5.0000 mL | Freq: Once | ORAL | Status: AC
  Administered 2014-01-01: 5 mL via ORAL
  Filled 2014-01-01: qty 5

## 2014-01-01 NOTE — ED Notes (Signed)
Pt c/o lower abdominal pain and cough, sore throat, and left side pain when she coughs.

## 2014-01-01 NOTE — ED Provider Notes (Signed)
CSN: [id number]     Arrival date & time 01/01/14  2027 History  This chart was scribed for Nat Christen, MD by Ladene Artist, ED Scribe. The patient was seen in room APA01/APA01. Patient's care was started at 11:11 PM.    Chief Complaint  Patient presents with  . Abdominal Pain    The history is provided by the patient. No language interpreter was used.   HPI Comments: Sarah [name] is a 53 y.o. female, with a h/o DM, who presents to the Emergency Department complaining of gradually worsening cough that irritates her chest onset 2 days ago. Pain radiates to her L side. She also reports L abdominal cramping with coughing, sore throat and HA. 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 She reports elevated glucose levels such as 226. Pt is on 30 units of insulin at night. No h/o high blood pressure. Severity is mild to moderate  PCP: Legrand Rams  Past Medical History  Diagnosis Date  . Diabetes mellitus   . Diverticulosis   . Hypercholesteremia   . Anxiety   . GERD (gastroesophageal reflux disease)    Past Surgical History  Procedure Laterality Date  . Abdominal hysterectomy      complete  . Foot surgery      right-tendon repair  . Colonoscopy  01/04/2010    left sided transverse diverticula/two diminutive rectal polyps  . Laparoscopic sigmoid colectomy  02/01/10    Dr Jola Babinski  . Colonoscopy  03/28/2012    Procedure: COLONOSCOPY;  Surgeon: Daneil Dolin, MD;  [facility]: [facility];  Service: Endoscopy;  Laterality: N/A;  9:30  . Cardiac catheterization  2011   Family History  Problem Relation Age of Onset  . Colon cancer Maternal Grandfather   . Colon polyps Mother    History  Substance Use Topics  . Smoking status: Former Smoker -- 1.00 packs/day for 10 years    Types: Cigarettes  . Smokeless tobacco: Former Systems developer    Quit date: 03/28/2008  . Alcohol Use: Yes     Comment: occasional   OB History   Grav Para Term Preterm Abortions TAB SAB Ect Mult  Living                 Review of Systems  HENT: Positive for sore throat.   Respiratory: Positive for cough.   Gastrointestinal: Positive for abdominal pain.  Musculoskeletal: Positive for myalgias.  Neurological: Positive for headaches.  All other systems reviewed and are negative.   Allergies  Sulfonamide derivatives and Latex  Home Medications   Prior to Admission medications   Medication Sig Start Date End Date Taking? Authorizing Provider  albuterol (PROVENTIL) (2.5 MG/3ML) 0.083% nebulizer solution Take 3 mLs (2.5 mg total) by nebulization every 4 (four) hours as needed for wheezing. 05/10/13   Maudry Diego, MD  amoxicillin (AMOXIL) 500 MG capsule Take 1 capsule (500 mg total) by mouth 3 (three) times daily. 09/27/13   Evalee Jefferson, PA-C  antipyrine-benzocaine Toniann Fail) otic solution Place 3-4 drops into the right ear every 2 (two) hours as needed for ear pain.    Historical Provider, MD  diphenhydrAMINE (BENADRYL) 25 MG tablet Take 25 mg by mouth at bedtime as needed for allergies or sleep.     Historical Provider, MD  estradiol (ESTRACE) 0.1 MG/GM vaginal cream Place 1 Applicatorful vaginally every other day.    Historical Provider, MD  fluticasone (FLONASE) 50 MCG/ACT nasal spray Place 1 spray into both nostrils daily. 09/24/13   Harrell Gave  J. Pollina, MD  HYDROcodone-acetaminophen (NORCO/VICODIN) 5-325 MG per tablet Take 1 tablet by mouth every 4 (four) hours as needed for moderate pain. 09/27/13   Evalee Jefferson, PA-C  ibuprofen (ADVIL,MOTRIN) 800 MG tablet Take 1 tablet (800 mg total) by mouth 3 (three) times daily. 09/24/13   Orpah Greek, MD  INVOKANA 100 MG TABS Take 100 mg by mouth daily.  03/08/12   Historical Provider, MD  LANTUS SOLOSTAR 100 UNIT/ML injection Inject 30 Units into the skin at bedtime.  01/17/12   Historical Provider, MD  metFORMIN (GLUCOPHAGE) 500 MG tablet Take 500 mg by mouth 2 (two) times daily with a meal.    Historical Provider, MD  ondansetron  (ZOFRAN) 4 MG tablet Take 1 tablet (4 mg total) by mouth every 6 (six) hours. 09/24/13   Orpah Greek, MD  oseltamivir (TAMIFLU) 75 MG capsule Take 1 capsule (75 mg total) by mouth every 12 (twelve) hours. 09/24/13   Orpah Greek, MD  polyethylene glycol powder (GLYCOLAX/MIRALAX) powder Take 17 g by mouth daily as needed (for constipation).    Historical Provider, MD   BP 130/79  Pulse 75  Temp(Src) 98.4 F (36.9 C) (Oral)  Resp 18  Ht 5\' 6"  (1.676 m)  Wt 189 lb (85.73 kg)  BMI 30.52 kg/m2  SpO2 100% Physical Exam  Nursing note and vitals reviewed. Constitutional: She is oriented to person, place, and time. She appears well-developed and well-nourished.  Looks well  coughing  HENT:  Head: Normocephalic and atraumatic.  Eyes: Conjunctivae and EOM are normal. Pupils are equal, round, and reactive to light.  Neck: Normal range of motion. Neck supple.  Cardiovascular: Normal rate, regular rhythm and normal heart sounds.   Pulmonary/Chest: Effort normal and breath sounds normal.  Abdominal: Soft. Bowel sounds are normal. There is no tenderness.  Slightly tender in L abdomen   Musculoskeletal: Normal range of motion.  Neurological: She is alert and oriented to person, place, and time.  Skin: Skin is warm and dry.  Psychiatric: She has a normal mood and affect. Her behavior is normal.    ED Course  Procedures (including critical care time) DIAGNOSTIC STUDIES: Oxygen Saturation is 95% on room air which is normal by my interpretation.    COORDINATION OF CARE: 11:16 PM Discussed treatment plan with pt which includes CXR, breathing treatment and cough syrup at bedside and pt agreed to plan.  Labs Review Labs Reviewed - No data to display  Imaging Review Dg Chest 2 View  01/02/2014   CLINICAL DATA:  Cough  EXAM: CHEST  2 VIEW  COMPARISON:  Prior radiograph from 05/10/2013  FINDINGS: The cardiac and mediastinal silhouettes are stable in size and contour, and remain  within normal limits.  The lungs are normally inflated. No airspace consolidation, pleural effusion, or pulmonary edema is identified. There is no pneumothorax.  No acute osseous abnormality identified.  IMPRESSION: No active cardiopulmonary disease.   Electronically Signed   By: Jeannine Boga M.D.   On: 01/02/2014 00:08     EKG Interpretation None      MDM   Final diagnoses:  Bronchitis    History and physical most consistent with bronchitis. Rx albuterol/Atrovent nebulizer treatment. Chest x-ray negative. Discharge medications Zithromax, Tussionex, albuterol inhaler.  I personally performed the services described in this documentation, which was scribed in my presence. The recorded information has been reviewed and is accurate.    Nat Christen, MD 01/02/14 [phone]

## 2014-01-02 MED ORDER — HYDROCOD POLST-CHLORPHEN POLST 10-8 MG/5ML PO LQCR
5.0000 mL | Freq: Two times a day (BID) | ORAL | Status: DC | PRN
Start: 2014-01-02 — End: 2014-05-10

## 2014-01-02 MED ORDER — ACETAMINOPHEN 500 MG PO TABS
1000.0000 mg | ORAL_TABLET | Freq: Once | ORAL | Status: AC
  Administered 2014-01-02: 1000 mg via ORAL
  Filled 2014-01-02: qty 2

## 2014-01-02 MED ORDER — AZITHROMYCIN 250 MG PO TABS: ORAL_TABLET | ORAL | Status: DC

## 2014-01-02 MED ORDER — IBUPROFEN 800 MG PO TABS
800.0000 mg | ORAL_TABLET | Freq: Once | ORAL | Status: AC
  Administered 2014-01-02: 800 mg via ORAL
  Filled 2014-01-02: qty 1

## 2014-01-02 MED ORDER — ALBUTEROL SULFATE HFA 108 (90 BASE) MCG/ACT IN AERS
1.0000 | INHALATION_SPRAY | Freq: Four times a day (QID) | RESPIRATORY_TRACT | Status: DC | PRN
Start: 2014-01-02 — End: 2017-02-12

## 2014-01-02 NOTE — Discharge Instructions (Signed)
Bronchitis Bronchitis is swelling (inflammation) of the air tubes leading to your lungs (bronchi). This causes mucus and a cough. If the swelling gets bad, you may have trouble breathing. HOME CARE   Rest.  Drink enough fluids to keep your pee (urine) clear or pale yellow (unless you have a condition where you have to watch how much you drink).  Only take medicine as told by your doctor. If you were given antibiotic medicines, finish them even if you start to feel better.  Avoid smoke, irritating chemicals, and strong smells. These make the problem worse. Quit smoking if you smoke. This helps your lungs heal faster.  Use a cool mist humidifier. Change the water in the humidifier every day. You can also sit in the bathroom with hot shower running for 5 10 minutes. Keep the door closed.  See your health care provider as told.  Wash your hands often. GET HELP IF: Your problems do not get better after 1 week. GET HELP RIGHT AWAY IF:   Your fever gets worse.  You have chills.  Your chest hurts.  Your problems breathing get worse.  You have blood in your mucus.  You pass out (faint).  You feel lightheaded.  You have a bad headache.  You throw up (vomit) again and again. MAKE SURE YOU:  Understand these instructions.  Will watch your condition.  Will get help right away if you are not doing well or get worse. Document Released: 01/25/2008 Document Revised: 05/29/2013 Document Reviewed: 04/02/2013 [facility] Patient Information 2014 [place], [place].   Chest x-ray was normal. Increase fluids. Prescription for antibiotic, inhaler, cough syrup. Followup your primary care Dr.    Lilian Coma also take Tylenol and ibuprofen

## 2014-05-10 ENCOUNTER — Encounter (HOSPITAL_COMMUNITY): Payer: Self-pay | Admitting: Emergency Medicine

## 2014-05-10 ENCOUNTER — Emergency Department (HOSPITAL_COMMUNITY): Payer: BC Managed Care – PPO

## 2014-05-10 ENCOUNTER — Emergency Department (HOSPITAL_COMMUNITY)
Admission: EM | Admit: 2014-05-10 | Discharge: 2014-05-10 | Disposition: A | Discharge status: Home / Self Care | Payer: BC Managed Care – PPO | Attending: Emergency Medicine | Admitting: Emergency Medicine

## 2014-05-10 DIAGNOSIS — S99919A Unspecified injury of unspecified ankle, initial encounter: Secondary | ICD-10-CM

## 2014-05-10 DIAGNOSIS — S8990XA Unspecified injury of unspecified lower leg, initial encounter: Secondary | ICD-10-CM | POA: Diagnosis present

## 2014-05-10 DIAGNOSIS — E119 Type 2 diabetes mellitus without complications: Secondary | ICD-10-CM | POA: Insufficient documentation

## 2014-05-10 DIAGNOSIS — Y929 Unspecified place or not applicable: Secondary | ICD-10-CM | POA: Insufficient documentation

## 2014-05-10 DIAGNOSIS — Z91040 Latex allergy status: Secondary | ICD-10-CM | POA: Diagnosis not present

## 2014-05-10 DIAGNOSIS — Z8659 Personal history of other mental and behavioral disorders: Secondary | ICD-10-CM | POA: Insufficient documentation

## 2014-05-10 DIAGNOSIS — S82899A Other fracture of unspecified lower leg, initial encounter for closed fracture: Secondary | ICD-10-CM | POA: Diagnosis not present

## 2014-05-10 DIAGNOSIS — Z9889 Other specified postprocedural states: Secondary | ICD-10-CM | POA: Diagnosis not present

## 2014-05-10 DIAGNOSIS — S82401A Unspecified fracture of shaft of right fibula, initial encounter for closed fracture: Secondary | ICD-10-CM

## 2014-05-10 DIAGNOSIS — Y9301 Activity, walking, marching and hiking: Secondary | ICD-10-CM | POA: Diagnosis not present

## 2014-05-10 DIAGNOSIS — Z87891 Personal history of nicotine dependence: Secondary | ICD-10-CM | POA: Insufficient documentation

## 2014-05-10 DIAGNOSIS — X58XXXA Exposure to other specified factors, initial encounter: Secondary | ICD-10-CM | POA: Insufficient documentation

## 2014-05-10 DIAGNOSIS — Z791 Long term (current) use of non-steroidal anti-inflammatories (NSAID): Secondary | ICD-10-CM | POA: Insufficient documentation

## 2014-05-10 DIAGNOSIS — Z8719 Personal history of other diseases of the digestive system: Secondary | ICD-10-CM | POA: Insufficient documentation

## 2014-05-10 DIAGNOSIS — Z79899 Other long term (current) drug therapy: Secondary | ICD-10-CM | POA: Diagnosis not present

## 2014-05-10 DIAGNOSIS — S99929A Unspecified injury of unspecified foot, initial encounter: Secondary | ICD-10-CM

## 2014-05-10 MED ORDER — HYDROMORPHONE HCL 1 MG/ML IJ SOLN
1.0000 mg | Freq: Once | INTRAMUSCULAR | Status: AC
  Administered 2014-05-10: 1 mg via INTRAMUSCULAR
  Filled 2014-05-10: qty 1

## 2014-05-10 MED ORDER — ONDANSETRON 8 MG PO TBDP
8.0000 mg | ORAL_TABLET | Freq: Once | ORAL | Status: AC
  Administered 2014-05-10: 8 mg via ORAL
  Filled 2014-05-10: qty 1

## 2014-05-10 MED ORDER — IBUPROFEN 600 MG PO TABS: 600.0000 mg | ORAL_TABLET | Freq: Four times a day (QID) | ORAL | Status: DC | PRN

## 2014-05-10 MED ORDER — OXYCODONE-ACETAMINOPHEN 5-325 MG PO TABS: 1.0000 | ORAL_TABLET | ORAL | Status: DC | PRN

## 2014-05-10 MED ORDER — OXYCODONE-ACETAMINOPHEN 5-325 MG PO TABS
2.0000 | ORAL_TABLET | ORAL | Status: DC | PRN
Start: 2014-05-10 — End: 2015-08-19

## 2014-05-10 NOTE — ED Notes (Signed)
Brisk cap refill to [facility] after splint applied.

## 2014-05-10 NOTE — Discharge Instructions (Signed)
Cast or Splint Care °Casts and splints support injured limbs and keep bones from moving while they heal. It is important to care for your cast or splint at home.   °HOME CARE INSTRUCTIONS °· Keep the cast or splint uncovered during the drying period. It can take 24 to 48 hours to dry if it is made of plaster. A fiberglass cast will dry in less than 1 hour. °· Do not rest the cast on anything harder than a pillow for the first 24 hours. °· Do not put weight on your injured limb or apply pressure to the cast until your health care provider gives you permission. °· Keep the cast or splint dry. Wet casts or splints can lose their shape and may not support the limb as well. A wet cast that has lost its shape can also create harmful pressure on your skin when it dries. Also, wet skin can become infected. °¨ Cover the cast or splint with a plastic bag when bathing or when out in the rain or snow. If the cast is on the trunk of the body, take sponge baths until the cast is removed. °¨ If your cast does become wet, dry it with a towel or a blow dryer on the cool setting only. °· Keep your cast or splint clean. Soiled casts may be wiped with a moistened cloth. °· Do not place any hard or soft foreign objects under your cast or splint, such as cotton, toilet paper, lotion, or powder. °· Do not try to scratch the skin under the cast with any object. The object could get stuck inside the cast. Also, scratching could lead to an infection. If itching is a problem, use a blow dryer on a cool setting to relieve discomfort. °· Do not trim or cut your cast or remove padding from inside of it. °· Exercise all joints next to the injury that are not immobilized by the cast or splint. For example, if you have a long leg cast, exercise the hip joint and toes. If you have an arm cast or splint, exercise the shoulder, elbow, thumb, and fingers. °· Elevate your injured arm or leg on 1 or 2 pillows for the first 1 to 3 days to decrease  swelling and pain. It is best if you can comfortably elevate your cast so it is higher than your heart. °SEEK MEDICAL CARE IF:  °· Your cast or splint cracks. °· Your cast or splint is too tight or too loose. °· You have unbearable itching inside the cast. °· Your cast becomes wet or develops a soft spot or area. °· You have a bad smell coming from inside your cast. °· You get an object stuck under your cast. °· Your skin around the cast becomes red or raw. °· You have new pain or worsening pain after the cast has been applied. °SEEK IMMEDIATE MEDICAL CARE IF:  °· You have fluid leaking through the cast. °· You are unable to move your fingers or toes. °· You have discolored (blue or white), cool, painful, or very swollen fingers or toes beyond the cast. °· You have tingling or numbness around the injured area. °· You have severe pain or pressure under the cast. °· You have any difficulty with your breathing or have shortness of breath. °· You have chest pain. °Document Released: 08/05/2000 Document Revised: 05/29/2013 Document Reviewed: 02/14/2013 °[employer]® Patient Information ©2015 [employer]. This information is not intended to replace advice given to you by your health care   provider. Make sure you discuss any questions you have with your health care provider.  Fibular Fracture, Ankle, Adult, Undisplaced, Treated With Immobilization A simple fracture of the bone below the knee on the outside of your leg (fibula) usually heals without problems. CAUSES Typically, a fibular fracture occurs as a result of trauma. A blow to the side of your leg or a powerful twisting movement can cause a fracture. Fibular fractures are often seen as a result of football, soccer, or skiing injuries. SYMPTOMS Symptoms of a fibular fracture can include:  Pain.  Shortening or abnormal alignment of your lower leg (angulation). DIAGNOSIS A health care provider will need to examine the leg. X-ray exams will be ordered for  further to confirm the fracture and evaluate the extent and of the injury. TREATMENT  Typically, a cast or immobilizer is applied. Sometimes a splint is placed on these fractures if it is needed for comfort or if the bones are badly out of place. Crutches may be needed to help you get around.  HOME CARE INSTRUCTIONS   Apply ice to the injured area:  Put ice in a plastic bag.  Place a towel between your skin and the bag.  Leave the ice on for 20 minutes, 2-3 times a day.  Use crutches as directed. Resume walking without crutches as directed by your health care provider or when comfortable doing so.  Only take over-the-counter or prescription medicines for pain, discomfort, or fever as directed by your health care provider.  Keeping your leg raised may lessen swelling.  If you have a removable splint or boot, do not remove the boot unless directed by your health care provider.  Do not not drive a car or operate a motor vehicle until your health care provider specifically tells you it is safe to do so. SEEK IMMEDIATE MEDICAL CARE IF:   Your cast gets damaged or breaks.  You have continued severe pain or more swelling than you did before the cast was put on, or the pain is not controlled with medications.  Your skin or nails below the injury turn blue or grey, or feel cold or numb.  There is a bad smell or pus coming from under the cast.  You develop severe pain in ankle or foot. MAKE SURE YOU:   Understand these instructions.  Will watch your condition.  Will get help right away if you are not doing well or get worse. Document Released: 04/30/2002 Document Revised: 05/29/2013 Document Reviewed: 03/20/2013 [facility] Patient Information 2015 [place], [place]. This information is not intended to replace advice given to you by your health care provider. Make sure you discuss any questions you have with your health care provider.  Use ice and elevation as much as possible for the  next several days to minimize swelling and pain.  Use the pain medications as prescribed.  Do not drive within 4 hours of taking oxycodone as this medication will make you drowsy.  However, you should use extreme caution with driving anyway as long as you remain in this cast.  Plan to see Dr. Aline Brochure earlier this week for further management of your injury.

## 2014-05-10 NOTE — ED Provider Notes (Signed)
CSN: [id number]     Arrival date & time 05/10/14  1828 History   First MD Initiated Contact with Patient 05/10/14 1832     Chief Complaint  Patient presents with  . Ankle Pain     (Consider location/radiation/quality/duration/timing/severity/associated sxs/prior Treatment) The history is provided by the patient and the spouse.   Sarah [name] is a 53 y.o. female presenting with right ankle ankle pain and swelling which occurred suddenly when stepped in a hole,  Causing an inversion injury to her ankle just prior to arrival.  Pain is aching, constant and worse with palpation, movement and she has been unable to weight bear since the injury occurred.   There is radiation of pain into her lower leg which is worsened with toe flexion.  She denies numbness distal to the injury site.  She has taken no medications prior to arrival.    Past Medical History  Diagnosis Date  . Diabetes mellitus   . Diverticulosis   . Hypercholesteremia   . Anxiety   . GERD (gastroesophageal reflux disease)    Past Surgical History  Procedure Laterality Date  . Abdominal hysterectomy      complete  . Foot surgery      right-tendon repair  . Colonoscopy  01/04/2010    left sided transverse diverticula/two diminutive rectal polyps  . Laparoscopic sigmoid colectomy  02/01/10    Dr Jola Babinski  . Colonoscopy  03/28/2012    Procedure: COLONOSCOPY;  Surgeon: Daneil Dolin, MD;  Location: [facility];  Service: Endoscopy;  Laterality: N/A;  9:30  . Cardiac catheterization  2011   Family History  Problem Relation Age of Onset  . Colon cancer Maternal Grandfather   . Colon polyps Mother    History  Substance Use Topics  . Smoking status: Former Smoker -- 1.00 packs/day for 10 years    Types: Cigarettes  . Smokeless tobacco: Former Systems developer    Quit date: 03/28/2008  . Alcohol Use: Yes     Comment: occasional   OB History   Grav Para Term Preterm Abortions TAB SAB Ect Mult Living                  Review of Systems  Musculoskeletal: Positive for arthralgias and joint swelling.  Skin: Negative for wound.  Neurological: Negative for weakness and numbness.      Allergies  Sulfonamide derivatives and Latex  Home Medications   Prior to Admission medications   Medication Sig Start Date End Date Taking? Authorizing Provider  diphenhydrAMINE (BENADRYL) 25 MG tablet Take 25 mg by mouth at bedtime as needed for allergies or sleep.    Yes Historical Provider, MD  estradiol (ESTRACE) 0.1 MG/GM vaginal cream Place 1 Applicatorful vaginally every other day.   Yes Historical Provider, MD  INVOKANA 100 MG TABS Take 100 mg by mouth daily.  03/08/12  Yes Historical Provider, MD  LANTUS SOLOSTAR 100 UNIT/ML injection Inject 20 Units into the skin at bedtime.  01/17/12  Yes Historical Provider, MD  metFORMIN (GLUCOPHAGE) 500 MG tablet Take 500 mg by mouth 2 (two) times daily with a meal.   Yes Historical Provider, MD  albuterol (PROVENTIL HFA;VENTOLIN HFA) 108 (90 BASE) MCG/ACT inhaler Inhale 1-2 puffs into the lungs every 6 (six) hours as needed for wheezing or shortness of breath. 01/02/14   Nat Christen, MD  albuterol (PROVENTIL) (2.5 MG/3ML) 0.083% nebulizer solution Take 3 mLs (2.5 mg total) by nebulization every 4 (four) hours as needed for wheezing.  05/10/13   Maudry Diego, MD  ibuprofen (ADVIL,MOTRIN) 600 MG tablet Take 1 tablet (600 mg total) by mouth every 6 (six) hours as needed. 05/10/14   Evalee Jefferson, PA-C  oxyCODONE-acetaminophen (PERCOCET/ROXICET) 5-325 MG per tablet Take 1 tablet by mouth every 4 (four) hours as needed. 05/10/14   Evalee Jefferson, PA-C   BP 151/72  Pulse 96  Temp(Src) 99.4 F (37.4 C) (Oral)  Resp 25  Ht 5\' 3"  (1.6 m)  Wt 185 lb (83.915 kg)  BMI 32.78 kg/m2  SpO2 100% Physical Exam  Nursing note and vitals reviewed. Constitutional: She appears well-developed and well-nourished.  HENT:  Head: Normocephalic.  Cardiovascular: Normal rate and intact distal pulses.   Exam reveals no decreased pulses.   Pulses:      Dorsalis pedis pulses are 2+ on the right side, and 2+ on the left side.       Posterior tibial pulses are 2+ on the right side, and 2+ on the left side.  Musculoskeletal: She exhibits edema and tenderness.       Right ankle: She exhibits decreased range of motion and swelling. She exhibits no deformity and normal pulse. Tenderness. Lateral malleolus tenderness found. No head of 5th metatarsal and no proximal fibula tenderness found. Achilles tendon normal. Achilles tendon exhibits no pain and no defect.  Distal sensation intact.  Neurological: She is alert. No sensory deficit.  Skin: Skin is warm, dry and intact.    ED Course  Procedures (including critical care time) Labs Review Labs Reviewed - No data to display  Imaging Review Dg Ankle Complete Right  05/10/2014   CLINICAL DATA:  Fall today.  Ankle pain and swelling.  EXAM: RIGHT ANKLE - COMPLETE 3+ VIEW  COMPARISON:  None.  FINDINGS: Oblique fracture of the distal fibula. This extends from the posterior proximal metaphysis to the anteromedial distal metaphysis. There is 2 mm of lateral displacement of the fracture. There is no angulation.  No other fractures.  Ankle mortise is normally space and aligned.  There is soft tissue swelling which predominates anteriorly and laterally.  Incidental note is made of dorsal and plantar calcaneal spurs.  IMPRESSION: Oblique fracture of the distal right fibula as described without significant displacement. Ankle joint remains normally aligned.   Electronically Signed   By: Lajean Manes M.D.   On: 05/10/2014 18:55     EKG Interpretation None      MDM   Final diagnoses:  Fibula fracture, right, closed, initial encounter    Pt given dilaudid 1 mg IM,  No improvement in pain.  After 20 minutes, pain escalating.  Repeated dilaidid x 1.  Crutches, posterior splint.  Oxycodone, ibuprofen, ice,  Elevation.  F/u with Dr Aline Brochure early this  week.  Patients labs and/or radiological studies were viewed and considered during the medical decision making and disposition process.  Splint was examined post application, pain improved,  Patient can wiggle digits, less than 3 sec cap refill.       Evalee Jefferson, PA-C 05/10/14 1931

## 2014-05-10 NOTE — ED Notes (Signed)
Pt states she was walking when she stepped in a hole injuring her right ankle.

## 2014-05-11 NOTE — ED Provider Notes (Signed)
Medical screening examination/treatment/procedure(s) were performed by non-physician practitioner and as supervising physician I was immediately available for consultation/collaboration.  Leota Jacobsen, MD 05/11/14 [phone]

## 2014-05-12 ENCOUNTER — Encounter: Payer: Self-pay | Admitting: Orthopedic Surgery

## 2014-05-12 ENCOUNTER — Ambulatory Visit (INDEPENDENT_AMBULATORY_CARE_PROVIDER_SITE_OTHER): Payer: BC Managed Care – PPO | Admitting: Orthopedic Surgery

## 2014-05-12 DIAGNOSIS — S82409A Unspecified fracture of shaft of unspecified fibula, initial encounter for closed fracture: Secondary | ICD-10-CM

## 2014-05-12 DIAGNOSIS — S82401A Unspecified fracture of shaft of right fibula, initial encounter for closed fracture: Secondary | ICD-10-CM

## 2014-05-12 MED ORDER — HYDROCODONE-ACETAMINOPHEN 7.5-325 MG PO TABS
1.0000 | ORAL_TABLET | ORAL | Status: DC | PRN
Start: 2014-05-12 — End: 2016-02-03

## 2014-05-12 NOTE — Patient Instructions (Signed)
Return to work, no driving, monitoring only No weight bearing Use walker

## 2014-05-13 NOTE — Progress Notes (Signed)
Chief complaint right ankle pain  Today's go patient slipped while fishing Injury date September 19 Symptoms pain swelling over the right ankle and fibula including the foot Description sharp aching pain Constant 9/10  x-rays have been done Posterior splint has been applied in the emergency room  Meds ibuprofen oxycodone oxycodone causing sickness and nausea Symptoms improved with elevation  Review of systems night sweats ringing in the ears skin rashes otherwise all other systems reviewed were normal. The past medical history medication list allergies family history and social history are recorded in the appropriate portions of fracture  The exam reveals a weight of 185 and height of 5 foot 3 a blood pressure 133/84 her appearance is normal without deformity. She is oriented to person place and time. Her mood is pleasant her affect is normal  She's walking with crutches nonweightbearing  Upper extremities inspection normal skin normal muscle tone normal.  Right lower extremity tender over the fibula with swelling in the foot painful range of motion at the ankle but the ankle comes in neutral position shows a fungal deformity of the great toe nail  Ankle joint stable strength cannot determine muscle tone normal skin intact mild bruising pulses normal lymph nodes negative sensation normal pathologic reflexes none deep tendon reflexes deferred balance excellent  X-ray Weber B. type fibular fracture with mild rotational change but no mortise disruption  Fibular fracture  Cam Walker  Nonweightbearing  X-ray 3 weeks

## 2014-05-14 ENCOUNTER — Telehealth: Payer: Self-pay | Admitting: Orthopedic Surgery

## 2014-05-14 NOTE — Telephone Encounter (Signed)
Patient states she tightened up boot and will see if that helps, also advised her she can use the pump to inflate air into boot to provide more support, advised to let us know if neither helps as she doesn't want to rub a sore on her heel

## 2014-05-14 NOTE — Telephone Encounter (Signed)
Patient called with question regarding her brace/cam walker boot, which she received here 05/12/14; states she is using sock, but that it is still irritating her heel.  Her ph# is [phone].

## 2014-05-21 ENCOUNTER — Telehealth: Payer: Self-pay | Admitting: Orthopedic Surgery

## 2014-05-21 NOTE — Telephone Encounter (Signed)
Patient called to relay that she needs her work note to be updated to indicate No work, until her next appointment, or further notice; states she is having difficulty  standing and monitoring as well. Okay to issue note?  Her phone# is [phone]

## 2014-05-22 NOTE — Telephone Encounter (Signed)
ok 

## 2014-05-26 ENCOUNTER — Encounter: Payer: Self-pay | Admitting: Orthopedic Surgery

## 2014-05-26 NOTE — Telephone Encounter (Signed)
Called back to patient, note done;  patient called back (I had no response to 05/21/14 call).

## 2014-06-03 ENCOUNTER — Ambulatory Visit (INDEPENDENT_AMBULATORY_CARE_PROVIDER_SITE_OTHER): Payer: Self-pay | Admitting: Orthopedic Surgery

## 2014-06-03 ENCOUNTER — Ambulatory Visit (INDEPENDENT_AMBULATORY_CARE_PROVIDER_SITE_OTHER): Payer: BC Managed Care – PPO

## 2014-06-03 ENCOUNTER — Encounter: Payer: Self-pay | Admitting: Orthopedic Surgery

## 2014-06-03 VITALS — BP 122/89 | Ht 63.0 in | Wt 180.0 lb

## 2014-06-03 DIAGNOSIS — S82891A Other fracture of right lower leg, initial encounter for closed fracture: Secondary | ICD-10-CM

## 2014-06-03 DIAGNOSIS — S82401D Unspecified fracture of shaft of right fibula, subsequent encounter for closed fracture with routine healing: Secondary | ICD-10-CM

## 2014-06-03 NOTE — Progress Notes (Signed)
Chief Complaint  Patient presents with  . 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

## 2014-06-03 NOTE — Patient Instructions (Addendum)
Continue wearing boot Separate note for REC dept for light duty, sitting only

## 2014-07-01 ENCOUNTER — Encounter: Payer: Self-pay | Admitting: Orthopedic Surgery

## 2014-07-01 ENCOUNTER — Ambulatory Visit (INDEPENDENT_AMBULATORY_CARE_PROVIDER_SITE_OTHER): Payer: Self-pay | Admitting: Orthopedic Surgery

## 2014-07-01 ENCOUNTER — Ambulatory Visit (INDEPENDENT_AMBULATORY_CARE_PROVIDER_SITE_OTHER): Payer: BC Managed Care – PPO

## 2014-07-01 VITALS — BP 125/78 | Ht 63.0 in | Wt 180.0 lb

## 2014-07-01 DIAGNOSIS — S82401D Unspecified fracture of shaft of right fibula, subsequent encounter for closed fracture with routine healing: Secondary | ICD-10-CM

## 2014-07-01 DIAGNOSIS — S82891A Other fracture of right lower leg, initial encounter for closed fracture: Secondary | ICD-10-CM

## 2014-07-01 NOTE — Patient Instructions (Signed)
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

## 2014-07-01 NOTE — Progress Notes (Signed)
Patient ID: Sarah [name], female   DOB: 08-13-1961, 53 y.o.   MRN: [id number] Chief Complaint  Patient presents with  . Follow-up    4 week recheck + xray Right ankle fx, DOI 05/10/14    Right fibular fracture this is a 7 week plus x-ray. Patient is improving with terms of pain and ambulation  X-ray shows fracture healing mortise intact  Clinical exam minimal tenderness  She'll be on work restrictions a little bit longer a few more weeks  She can resume normal activities weightbearing as tolerated and drive. 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

## 2014-10-03 ENCOUNTER — Encounter: Payer: BC Managed Care – PPO | Attending: "Endocrinology | Admitting: Nutrition

## 2014-11-20 ENCOUNTER — Encounter: Payer: BC Managed Care – PPO | Attending: "Endocrinology | Admitting: Nutrition

## 2014-11-20 ENCOUNTER — Encounter: Payer: Self-pay | Admitting: Nutrition

## 2014-11-20 VITALS — Ht 63.0 in | Wt 188.0 lb

## 2014-11-20 DIAGNOSIS — Z794 Long term (current) use of insulin: Secondary | ICD-10-CM | POA: Insufficient documentation

## 2014-11-20 DIAGNOSIS — E118 Type 2 diabetes mellitus with unspecified complications: Secondary | ICD-10-CM | POA: Insufficient documentation

## 2014-11-20 DIAGNOSIS — E1165 Type 2 diabetes mellitus with hyperglycemia: Secondary | ICD-10-CM

## 2014-11-20 DIAGNOSIS — Z713 Dietary counseling and surveillance: Secondary | ICD-10-CM | POA: Insufficient documentation

## 2014-11-20 DIAGNOSIS — IMO0002 Reserved for concepts with insufficient information to code with codable children: Secondary | ICD-10-CM

## 2014-11-20 DIAGNOSIS — Z6833 Body mass index (BMI) 33.0-33.9, adult: Secondary | ICD-10-CM | POA: Diagnosis not present

## 2014-11-20 DIAGNOSIS — E669 Obesity, unspecified: Secondary | ICD-10-CM | POA: Insufficient documentation

## 2014-11-20 NOTE — Patient Instructions (Signed)
Goals: 1. Follow My Plate as discussed. 2. Do not skip meals, especially breakfast. 3. Eat 30-45 g of carbs per meal 4. Increase fresh fruits and vegetables. 5. Walk 15 minutes twice a day for weight loss. 6. Take 30 units of Lantus every night as prescrbed. Do not skip doses. 7. Take Metformin as instructed. 8. Lose 1 lb per week 9. Get  A1C down to 8% in three months. Keep a food journal and BS log and bring at each visit.

## 2014-11-20 NOTE — Progress Notes (Signed)
  Medical Nutrition Therapy:  Appt start time: 0800 end time:  0900.  Assessment:  Primary concerns today: DIabetes. Most recent A1C 11%. Sometimes forgets her night time Lantus. Takes 30 units at night and 1000 mg of Metformin BID. Lives with her husband and her husband does the shopping and she does the cooking.  Use to walk but hasn't lately due to breaking her ankle  In August.  Hasn't had any regular or diet sodas in three months. Tests twice a day but not consistently. DIdn't breing meter or BS log.  Per her report.FBS are 150-160s in the last 2 months or so. Night time Bs 180-mid 200's.      This is the most she has ever weighed. Missed her follow up appointment with Dr. Dorris Fetch and needs to reschedule that. Has to get her labs done first.      Diet is inconsistent and insuffient to meet her nutritional needs to manage her DM.   She reports she has changed PCP from Dr. Legrand Rams to Dr. Iona Beard in [place], [place].  Preferred Learning Style:   Visual  Hands on   Learn  Ready  Change in progress  MEDICATIONS: See list   DIETARY INTAKE:  24-hr recall:  B ( AM): Skips Snk ( AM): Fruit cup or peanuts, water  L ( PM): Oatmeal plain 1 pkt with water, sweet n low, OR Chicken salad or tuna salad or left overs Snk ( PM): none D ( PM): Toss salad-lettuce and cucumbers with [place] dressing, water OR Meatloaf, creamed potatoes and fried cabbage, water Snk ( PM):  Beverages: water  Usual physical activity:   Estimated energy needs: 1500 calories 170 g carbohydrates 112 g protein 42 g fat  Progress Towards Goal(s):  In progress.   Nutritional Diagnosis:  NB-1.1 Food and nutrition-related knowledge deficit As related to Diabetes.  As evidenced by A1C 11%.    Intervention:  Nutrition counseling and diabetes education on a low fat, low sodium high fiber diet using the Plate Method, CHO Counting, meal planning, s/s and treatment of hyper/hypoglycemia, complications of DM and benefits  of exercise to lower BS and provide weight loss.  Goals: 1. Follow My Plate as discussed. 2. Do not skip meals, especially breakfast. 3. Eat 30-45 g of carbs per meal 4. Increase fresh fruits and vegetables. 5. Walk 15 minutes twice a day for weight loss. 6. Take 30 units of Lantus every night as prescribed. Do not skip doses. 7. Take Metformin as instructed. 8. Lose 1 lb per week 9. Get  A1C down to 8% in three months. Keep a food journal and BS log and bring at each visit.  Teaching Method Utilized:  Visual Auditory Hands on  Handouts given during visit include: The Plate Method Meal Plan Card  Barriers to learning/adherence to lifestyle change: None  Demonstrated degree of understanding via:  Teach Back   Monitoring/Evaluation:  Dietary intake, exercise, meal planning, SBG, and body weight in 1 month(s).

## 2014-11-21 ENCOUNTER — Emergency Department (HOSPITAL_COMMUNITY)
Admission: EM | Admit: 2014-11-21 | Discharge: 2014-11-21 | Disposition: A | Discharge status: Home / Self Care | Payer: BC Managed Care – PPO | Attending: Emergency Medicine | Admitting: Emergency Medicine

## 2014-11-21 ENCOUNTER — Encounter (HOSPITAL_COMMUNITY): Payer: Self-pay | Admitting: Emergency Medicine

## 2014-11-21 ENCOUNTER — Emergency Department (HOSPITAL_COMMUNITY): Payer: BC Managed Care – PPO

## 2014-11-21 DIAGNOSIS — E119 Type 2 diabetes mellitus without complications: Secondary | ICD-10-CM | POA: Insufficient documentation

## 2014-11-21 DIAGNOSIS — Z87891 Personal history of nicotine dependence: Secondary | ICD-10-CM | POA: Insufficient documentation

## 2014-11-21 DIAGNOSIS — J029 Acute pharyngitis, unspecified: Secondary | ICD-10-CM | POA: Diagnosis present

## 2014-11-21 DIAGNOSIS — J069 Acute upper respiratory infection, unspecified: Secondary | ICD-10-CM | POA: Insufficient documentation

## 2014-11-21 DIAGNOSIS — Z8659 Personal history of other mental and behavioral disorders: Secondary | ICD-10-CM | POA: Diagnosis not present

## 2014-11-21 DIAGNOSIS — Z91040 Latex allergy status: Secondary | ICD-10-CM | POA: Diagnosis not present

## 2014-11-21 DIAGNOSIS — H9201 Otalgia, right ear: Secondary | ICD-10-CM | POA: Diagnosis not present

## 2014-11-21 DIAGNOSIS — Z794 Long term (current) use of insulin: Secondary | ICD-10-CM | POA: Diagnosis not present

## 2014-11-21 DIAGNOSIS — Z79899 Other long term (current) drug therapy: Secondary | ICD-10-CM | POA: Diagnosis not present

## 2014-11-21 DIAGNOSIS — Z8719 Personal history of other diseases of the digestive system: Secondary | ICD-10-CM | POA: Insufficient documentation

## 2014-11-21 LAB — RAPID STREP SCREEN (MED CTR MEBANE ONLY): STREPTOCOCCUS, GROUP A SCREEN (DIRECT): NEGATIVE

## 2014-11-21 LAB — CBG MONITORING, ED: GLUCOSE-CAPILLARY: 176 mg/dL — AB (ref 70–99)

## 2014-11-21 MED ORDER — IBUPROFEN 400 MG PO TABS
600.0000 mg | ORAL_TABLET | Freq: Once | ORAL | Status: AC
  Administered 2014-11-21: 600 mg via ORAL

## 2014-11-21 MED ORDER — IBUPROFEN 400 MG PO TABS
ORAL_TABLET | ORAL | Status: AC
  Filled 2014-11-21: qty 2

## 2014-11-21 NOTE — ED Notes (Signed)
Pt. Reports sore throat and right ear pain. Pt. Reports being seen by PCP on Wed. And started on Mucinex DM. Pt. Reports symptoms have become worse.

## 2014-11-21 NOTE — Discharge Instructions (Signed)
Drink plenty of fluids. You can take ibuprofen 600 mg + acetaminophen 1000 mg 4 times a day for fever or pain. You can use cepacol throat spray and OTC cough drops as needed. Continue the mucinex DM which is helping with your cough. Let Dr [name] know if you aren't improving in the next week.   Upper Respiratory Infection, Adult An upper respiratory infection (URI) is also sometimes known as the common cold. The upper respiratory tract includes the nose, sinuses, throat, trachea, and bronchi. Bronchi are the airways leading to the lungs. Most people improve within 1 week, but symptoms can last up to 2 weeks. A residual cough may last even longer.  CAUSES Many different viruses can infect the tissues lining the upper respiratory tract. The tissues become irritated and inflamed and often become very moist. Mucus production is also common. A cold is contagious. You can easily spread the virus to others by oral contact. This includes kissing, sharing a glass, coughing, or sneezing. Touching your mouth or nose and then touching a surface, which is then touched by another person, can also spread the virus. SYMPTOMS  Symptoms typically develop 1 to 3 days after you come in contact with a cold virus. Symptoms vary from person to person. They may include:  Runny nose.  Sneezing.  Nasal congestion.  Sinus irritation.  Sore throat.  Loss of voice (laryngitis).  Cough.  Fatigue.  Muscle aches.  Loss of appetite.  Headache.  Low-grade fever. DIAGNOSIS  You might diagnose your own cold based on familiar symptoms, since most people get a cold 2 to 3 times a year. Your caregiver can confirm this based on your exam. Most importantly, your caregiver can check that your symptoms are not due to another disease such as strep throat, sinusitis, pneumonia, asthma, or epiglottitis. Blood tests, throat tests, and X-rays are not necessary to diagnose a common cold, but they may sometimes be helpful in excluding  other more serious diseases. Your caregiver will decide if any further tests are required. RISKS AND COMPLICATIONS  You may be at risk for a more severe case of the common cold if you smoke cigarettes, have chronic heart disease (such as heart failure) or lung disease (such as asthma), or if you have a weakened immune system. The very young and very old are also at risk for more serious infections. Bacterial sinusitis, middle ear infections, and bacterial pneumonia can complicate the common cold. The common cold can worsen asthma and chronic obstructive pulmonary disease (COPD). Sometimes, these complications can require emergency medical care and may be life-threatening. PREVENTION  The best way to protect against getting a cold is to practice good hygiene. Avoid oral or hand contact with people with cold symptoms. Wash your hands often if contact occurs. There is no clear evidence that vitamin C, vitamin E, echinacea, or exercise reduces the chance of developing a cold. However, it is always recommended to get plenty of rest and practice good nutrition. TREATMENT  Treatment is directed at relieving symptoms. There is no cure. Antibiotics are not effective, because the infection is caused by a virus, not by bacteria. Treatment may include:  Increased fluid intake. Sports drinks offer valuable electrolytes, sugars, and fluids.  Breathing heated mist or steam (vaporizer or shower).  Eating chicken soup or other clear broths, and maintaining good nutrition.  Getting plenty of rest.  Using gargles or lozenges for comfort.  Controlling fevers with ibuprofen or acetaminophen as directed by your caregiver.  Increasing usage  of your inhaler if you have asthma. Zinc gel and zinc lozenges, taken in the first 24 hours of the common cold, can shorten the duration and lessen the severity of symptoms. Pain medicines may help with fever, muscle aches, and throat pain. A variety of non-prescription medicines  are available to treat congestion and runny nose. Your caregiver can make recommendations and may suggest nasal or lung inhalers for other symptoms.  HOME CARE INSTRUCTIONS   Only take over-the-counter or prescription medicines for pain, discomfort, or fever as directed by your caregiver.  Use a warm mist humidifier or inhale steam from a shower to increase air moisture. This may keep secretions moist and make it easier to breathe.  Drink enough water and fluids to keep your urine clear or pale yellow.  Rest as needed.  Return to work when your temperature has returned to normal or as your caregiver advises. You may need to stay home longer to avoid infecting others. You can also use a face mask and careful hand washing to prevent spread of the virus. SEEK MEDICAL CARE IF:   After the first few days, you feel you are getting worse rather than better.  You need your caregiver's advice about medicines to control symptoms.  You develop chills, worsening shortness of breath, or brown or red sputum. These may be signs of pneumonia.  You develop yellow or brown nasal discharge or pain in the face, especially when you bend forward. These may be signs of sinusitis.  You develop a fever, swollen neck glands, pain with swallowing, or white areas in the back of your throat. These may be signs of strep throat. SEEK IMMEDIATE MEDICAL CARE IF:   You have a fever.  You develop severe or persistent headache, ear pain, sinus pain, or chest pain.  You develop wheezing, a prolonged cough, cough up blood, or have a change in your usual mucus (if you have chronic lung disease).  You develop sore muscles or a stiff neck. Document Released: 02/01/2001 Document Revised: 10/31/2011 Document Reviewed: 11/13/2013 [facility] Patient Information 2015 [place], [place]. This information is not intended to replace advice given to you by your health care provider. Make sure you discuss any questions you have with your  health care provider.

## 2014-11-21 NOTE — ED Provider Notes (Signed)
CSN: [id number]     Arrival date & time 11/21/14  0122 History   First MD Initiated Contact with Patient 11/21/14 0135     Chief Complaint  Patient presents with  . Sore Throat  . Otalgia     (Consider location/radiation/quality/duration/timing/severity/associated sxs/prior Treatment) HPI  Patient reports 2 days ago she started having cough that was initially productive of yellow-green sputum with fever but no sore throat or rhinorrhea. She was seen by her PCP the following day, March 30 and was diagnosed with fluid behind the ears. She was started on Mucinex DM and a cough syrup formulated by her pharmacy. She reports she had fever today to 101. She also started having a sore throat. She denies rhinorrhea or sneezing. She states her right ear is hurting. She states her cough is improving and is no longer productive. She denies nausea, vomiting, or diarrhea. She states when she coughs it makes her head hurt. 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  PCP Dr Merlyn Albert  Past Medical History  Diagnosis Date  . Diabetes mellitus   . Diverticulosis   . Hypercholesteremia   . Anxiety   . GERD (gastroesophageal reflux disease)    Past Surgical History  Procedure Laterality Date  . Abdominal hysterectomy      complete  . Foot surgery      right-tendon repair  . Colonoscopy  01/04/2010    left sided transverse diverticula/two diminutive rectal polyps  . Laparoscopic sigmoid colectomy  02/01/10    Dr Jola Babinski  . Colonoscopy  03/28/2012    Procedure: COLONOSCOPY;  Surgeon: Daneil Dolin, MD;  Location: [facility];  Service: Endoscopy;  Laterality: N/A;  9:30  . Cardiac catheterization  2011   Family History  Problem Relation Age of Onset  . Colon cancer Maternal Grandfather   . Colon polyps Mother    History  Substance Use Topics  . Smoking status: Former Smoker -- 1.00  packs/day for 10 years    Types: Cigarettes  . Smokeless tobacco: Former Systems developer    Quit date: 03/28/2008  . Alcohol Use: Yes     Comment: occasional   employed  OB History    No data available     Review of Systems  All other systems reviewed and are negative.     Allergies  Sulfonamide derivatives and Latex  Home Medications   Prior to Admission medications   Medication Sig Start Date End Date Taking? Authorizing Provider  albuterol (PROVENTIL HFA;VENTOLIN HFA) 108 (90 BASE) MCG/ACT inhaler Inhale 1-2 puffs into the lungs every 6 (six) hours as needed for wheezing or shortness of breath. 01/02/14   Nat Christen, MD  albuterol (PROVENTIL) (2.5 MG/3ML) 0.083% nebulizer solution Take 3 mLs (2.5 mg total) by nebulization every 4 (four) hours as needed for wheezing. 05/10/13   Milton Ferguson, MD  diphenhydrAMINE (BENADRYL) 25 MG tablet Take 25 mg by mouth at bedtime as needed for allergies or sleep.     Historical Provider, MD  estradiol (ESTRACE) 0.1 MG/GM vaginal cream Place 1 Applicatorful vaginally every other day.    Historical Provider, MD  HYDROcodone-acetaminophen (NORCO) 7.5-325 MG per tablet Take 1 tablet by mouth every 4 (four) hours as needed for moderate pain. Patient not taking: Reported on 11/20/2014 05/12/14   Carole Civil, MD  ibuprofen (ADVIL,MOTRIN) 600 MG tablet Take 1 tablet (600 mg total) by  mouth every 6 (six) hours as needed. 05/10/14   Evalee Jefferson, PA-C  INVOKANA 100 MG TABS Take 100 mg by mouth daily.  03/08/12   Historical Provider, MD  LANTUS SOLOSTAR 100 UNIT/ML injection Inject 20 Units into the skin at bedtime.  01/17/12   Historical Provider, MD  metFORMIN (GLUCOPHAGE) 500 MG tablet Take 500 mg by mouth 2 (two) times daily with a meal.    Historical Provider, MD  oxyCODONE-acetaminophen (PERCOCET/ROXICET) 5-325 MG per tablet Take 1 tablet by mouth every 4 (four) hours as needed. Patient not taking: Reported on 11/20/2014 05/10/14   Evalee Jefferson, PA-C   oxyCODONE-acetaminophen (PERCOCET/ROXICET) 5-325 MG per tablet Take 2 tablets by mouth every 4 (four) hours as needed for moderate pain or severe pain. Patient not taking: Reported on 11/20/2014 05/10/14   Evalee Jefferson, PA-C   BP 154/88 mmHg  Pulse 84  Temp(Src) 98.6 F (37 C) (Oral)  Resp 24  Ht 5\' 3"  (1.6 m)  Wt 188 lb (85.276 kg)  BMI 33.31 kg/m2  SpO2 96% Vital signs normal   Physical Exam  Constitutional: She is oriented to person, place, and time. She appears well-developed and well-nourished.  Non-toxic appearance. She does not appear ill. No distress.  HENT:  Head: Normocephalic and atraumatic.  Right Ear: Hearing, tympanic membrane, external ear and ear canal normal.  Left Ear: External ear and ear canal normal.  Nose: Nose normal. No mucosal edema or rhinorrhea.  Mouth/Throat: Oropharynx is clear and moist and mucous membranes are normal. No dental abscesses or uvula swelling.  ? Mild thickness of the left TM without erythema  Eyes: Conjunctivae and EOM are normal. Pupils are equal, round, and reactive to light.  Neck: Normal range of motion and full passive range of motion without pain. Neck supple.  Cardiovascular: Normal rate, regular rhythm and normal heart sounds.  Exam reveals no gallop and no friction rub.   No murmur heard. Pulmonary/Chest: Effort normal. No respiratory distress. She has decreased breath sounds. She has no wheezes. She has no rhonchi. She has no rales. She exhibits no tenderness and no crepitus.  Abdominal: Soft. Normal appearance and bowel sounds are normal. She exhibits no distension. There is no tenderness. There is no rebound and no guarding.  Musculoskeletal: Normal range of motion. She exhibits no edema or tenderness.  Moves all extremities well.   Neurological: She is alert and oriented to person, place, and time. She has normal strength. No cranial nerve deficit.  Skin: Skin is warm, dry and intact. No rash noted. No erythema. No pallor.   Psychiatric: She has a normal mood and affect. Her speech is normal and behavior is normal. Her mood appears not anxious.  Nursing note and vitals reviewed.   ED Course  Procedures (including critical care time)  Medications  ibuprofen (ADVIL,MOTRIN) tablet 600 mg (600 mg Oral Given 11/21/14 0230)    Patient was given her test results. We discussed she is suffering from a viral illness and antibiotics are not going to help. She was encouraged to continue comfort measures..  Labs Review Results for orders placed or performed during the hospital encounter of 11/21/14  Rapid strep screen  Result Value Ref Range   Streptococcus, Group A Screen (Direct) NEGATIVE NEGATIVE  CBG monitoring, ED  Result Value Ref Range   Glucose-Capillary 176 (H) 70 - 99 mg/dL     Imaging Review Dg Chest 2 View  11/21/2014   CLINICAL DATA:  Acute onset of dry cough, worsening sore  throat and fever. Initial encounter.  EXAM: CHEST  2 VIEW  COMPARISON:  Chest radiograph performed 01/01/2014  FINDINGS: The lungs are well-aerated. Mild vascular congestion is noted. There is no evidence of focal opacification, pleural effusion or pneumothorax.  The heart is normal in size; the mediastinal contour is within normal limits. No acute osseous abnormalities are seen.  IMPRESSION: Mild vascular congestion noted; lungs remain grossly clear.   Electronically Signed   By: Garald Balding M.D.   On: 11/21/2014 02:42     EKG Interpretation None      MDM   Final diagnoses:  URI (upper respiratory infection)    Plan discharge OTC meds   Rolland Porter, MD, Barbette Or, MD 11/21/14 [phone]

## 2014-11-24 LAB — CULTURE, GROUP A STREP

## 2014-12-21 ENCOUNTER — Emergency Department (HOSPITAL_COMMUNITY): Payer: BC Managed Care – PPO

## 2014-12-21 ENCOUNTER — Emergency Department (HOSPITAL_COMMUNITY)
Admission: EM | Admit: 2014-12-21 | Discharge: 2014-12-21 | Disposition: A | Discharge status: Home / Self Care | Payer: BC Managed Care – PPO | Attending: Emergency Medicine | Admitting: Emergency Medicine

## 2014-12-21 ENCOUNTER — Encounter (HOSPITAL_COMMUNITY): Payer: Self-pay | Admitting: Emergency Medicine

## 2014-12-21 DIAGNOSIS — J40 Bronchitis, not specified as acute or chronic: Secondary | ICD-10-CM

## 2014-12-21 DIAGNOSIS — E119 Type 2 diabetes mellitus without complications: Secondary | ICD-10-CM | POA: Insufficient documentation

## 2014-12-21 DIAGNOSIS — Z79899 Other long term (current) drug therapy: Secondary | ICD-10-CM | POA: Insufficient documentation

## 2014-12-21 DIAGNOSIS — Z91040 Latex allergy status: Secondary | ICD-10-CM | POA: Insufficient documentation

## 2014-12-21 DIAGNOSIS — Z794 Long term (current) use of insulin: Secondary | ICD-10-CM | POA: Diagnosis not present

## 2014-12-21 DIAGNOSIS — Z9889 Other specified postprocedural states: Secondary | ICD-10-CM | POA: Diagnosis not present

## 2014-12-21 DIAGNOSIS — Z8659 Personal history of other mental and behavioral disorders: Secondary | ICD-10-CM | POA: Insufficient documentation

## 2014-12-21 DIAGNOSIS — Z8719 Personal history of other diseases of the digestive system: Secondary | ICD-10-CM | POA: Diagnosis not present

## 2014-12-21 DIAGNOSIS — R05 Cough: Secondary | ICD-10-CM | POA: Diagnosis present

## 2014-12-21 DIAGNOSIS — J069 Acute upper respiratory infection, unspecified: Secondary | ICD-10-CM

## 2014-12-21 DIAGNOSIS — Z87891 Personal history of nicotine dependence: Secondary | ICD-10-CM | POA: Insufficient documentation

## 2014-12-21 MED ORDER — IPRATROPIUM-ALBUTEROL 0.5-2.5 (3) MG/3ML IN SOLN
3.0000 mL | Freq: Once | RESPIRATORY_TRACT | Status: AC
  Administered 2014-12-21: 3 mL via RESPIRATORY_TRACT
  Filled 2014-12-21: qty 3

## 2014-12-21 MED ORDER — ACETAMINOPHEN 500 MG PO TABS
1000.0000 mg | ORAL_TABLET | Freq: Once | ORAL | Status: AC
  Administered 2014-12-21: 1000 mg via ORAL
  Filled 2014-12-21: qty 2

## 2014-12-21 MED ORDER — IBUPROFEN 400 MG PO TABS
400.0000 mg | ORAL_TABLET | Freq: Once | ORAL | Status: AC
  Administered 2014-12-21: 400 mg via ORAL
  Filled 2014-12-21: qty 1

## 2014-12-21 MED ORDER — BENZONATATE 100 MG PO CAPS: 100.0000 mg | ORAL_CAPSULE | Freq: Three times a day (TID) | ORAL | Status: DC | PRN

## 2014-12-21 MED ORDER — ALBUTEROL SULFATE HFA 108 (90 BASE) MCG/ACT IN AERS
2.0000 | INHALATION_SPRAY | RESPIRATORY_TRACT | Status: AC
  Administered 2014-12-21: 2 via RESPIRATORY_TRACT
  Filled 2014-12-21: qty 6.7

## 2014-12-21 MED ORDER — BENZONATATE 100 MG PO CAPS
100.0000 mg | ORAL_CAPSULE | Freq: Once | ORAL | Status: AC
  Administered 2014-12-21: 100 mg via ORAL
  Filled 2014-12-21: qty 1

## 2014-12-21 NOTE — ED Provider Notes (Signed)
CSN: [id number]     Arrival date & time 12/21/14  1854 History   First MD Initiated Contact with Patient 12/21/14 1914     Chief Complaint  Patient presents with  . Cough  . Nasal Congestion      HPI Pt was seen at [place].  Per pt, c/o gradual onset and persistence of constant runny/stuffy nose, sinus congestion, ears congestion and cough for the past 2-3 days. States she has been taking OTC mucinex and claritin without improvement of her symptoms. Denies fevers, no rash, no CP/SOB, no N/V/D, no abd pain.     Past Medical History  Diagnosis Date  . Diabetes mellitus   . Diverticulosis   . Hypercholesteremia   . Anxiety   . GERD (gastroesophageal reflux disease)    Past Surgical History  Procedure Laterality Date  . Abdominal hysterectomy      complete  . Foot surgery      right-tendon repair  . Colonoscopy  01/04/2010    left sided transverse diverticula/two diminutive rectal polyps  . Laparoscopic sigmoid colectomy  02/01/10    Dr Jola Babinski  . Colonoscopy  03/28/2012    Procedure: COLONOSCOPY;  Surgeon: Daneil Dolin, MD;  Location: [facility];  Service: Endoscopy;  Laterality: N/A;  9:30  . Cardiac catheterization  2011   Family History  Problem Relation Age of Onset  . Colon cancer Maternal Grandfather   . Colon polyps Mother    History  Substance Use Topics  . Smoking status: Former Smoker -- 1.00 packs/day for 10 years    Types: Cigarettes  . Smokeless tobacco: Former Systems developer    Quit date: 03/28/2008  . Alcohol Use: Yes     Comment: occasional    Review of Systems ROS: Statement: All systems negative except as marked or noted in the HPI; Constitutional: Negative for fever and chills. ; ; Eyes: Negative for eye pain, redness and discharge. ; ; ENMT: Negative for ear pain, hoarseness, sore throat. +nasal congestion, sinus pressure and rhinorrhea. ; ; Cardiovascular: Negative for chest pain, palpitations, diaphoresis, dyspnea and peripheral edema. ; ;  Respiratory: +cough, wheezing. Negative for stridor. ; ; Gastrointestinal: Negative for nausea, vomiting, diarrhea, abdominal pain, blood in stool, hematemesis, jaundice and rectal bleeding. . ; ; Genitourinary: Negative for dysuria, flank pain and hematuria. ; ; Musculoskeletal: Negative for back pain and neck pain. Negative for swelling and trauma.; ; Skin: Negative for pruritus, rash, abrasions, blisters, bruising and skin lesion.; ; Neuro: Negative for headache, lightheadedness and neck stiffness. Negative for weakness, altered level of consciousness , altered mental status, extremity weakness, paresthesias, involuntary movement, seizure and syncope.        Allergies  Sulfonamide derivatives and Latex  Home Medications   Prior to Admission medications   Medication Sig Start Date End Date Taking? Authorizing Provider  albuterol (PROVENTIL HFA;VENTOLIN HFA) 108 (90 BASE) MCG/ACT inhaler Inhale 1-2 puffs into the lungs every 6 (six) hours as needed for wheezing or shortness of breath. 01/02/14   Nat Christen, MD  albuterol (PROVENTIL) (2.5 MG/3ML) 0.083% nebulizer solution Take 3 mLs (2.5 mg total) by nebulization every 4 (four) hours as needed for wheezing. 05/10/13   Milton Ferguson, MD  diphenhydrAMINE (BENADRYL) 25 MG tablet Take 25 mg by mouth at bedtime as needed for allergies or sleep.     Historical Provider, MD  estradiol (ESTRACE) 0.1 MG/GM vaginal cream Place 1 Applicatorful vaginally every other day.    Historical Provider, MD  HYDROcodone-acetaminophen [facility]) 7.5-325  MG per tablet Take 1 tablet by mouth every 4 (four) hours as needed for moderate pain. Patient not taking: Reported on 11/20/2014 05/12/14   Carole Civil, MD  ibuprofen (ADVIL,MOTRIN) 600 MG tablet Take 1 tablet (600 mg total) by mouth every 6 (six) hours as needed. 05/10/14   Evalee Jefferson, PA-C  INVOKANA 100 MG TABS Take 100 mg by mouth daily.  03/08/12   Historical Provider, MD  LANTUS SOLOSTAR 100 UNIT/ML injection  Inject 20 Units into the skin at bedtime.  01/17/12   Historical Provider, MD  metFORMIN (GLUCOPHAGE) 500 MG tablet Take 500 mg by mouth 2 (two) times daily with a meal.    Historical Provider, MD  oxyCODONE-acetaminophen (PERCOCET/ROXICET) 5-325 MG per tablet Take 1 tablet by mouth every 4 (four) hours as needed. Patient not taking: Reported on 11/20/2014 05/10/14   Evalee Jefferson, PA-C  oxyCODONE-acetaminophen (PERCOCET/ROXICET) 5-325 MG per tablet Take 2 tablets by mouth every 4 (four) hours as needed for moderate pain or severe pain. Patient not taking: Reported on 11/20/2014 05/10/14   Evalee Jefferson, PA-C   BP 150/94 mmHg  Pulse 117  Temp(Src) 99.1 F (37.3 C) (Oral)  Resp 24  Ht 5\' 3"  (1.6 m)  Wt 187 lb (84.823 kg)  BMI 33.13 kg/m2  SpO2 97% Physical Exam  1925: Physical examination:  Nursing notes reviewed; Vital signs and O2 SAT reviewed;  Constitutional: Well developed, Well nourished, Well hydrated, In no acute distress; Head:  Normocephalic, atraumatic; Eyes: EOMI, PERRL, No scleral icterus; ENMT: TM's clear bilat. +edemetous nasal turbinates bilat with clear rhinorrhea. +visualized post nasal drip in posterior pharynx. Mouth and pharynx without lesions. No tonsillar exudates. No intra-oral edema. No submandibular or sublingual edema. No hoarse voice, no drooling, no stridor. No trismus. Mouth and pharynx normal, Mucous membranes moist; Neck: Supple, Full range of motion, No lymphadenopathy; Cardiovascular: Tachycardic rate and rhythm, No murmur, rub, or gallop; Respiratory: Breath sounds coarse & equal bilaterally, No wheezes. +near constant non-productive coughing during exam. Speaking full sentences with ease, Normal respiratory effort/excursion; Chest: Nontender, Movement normal; Abdomen: Soft, Nontender, Nondistended, Normal bowel sounds; Genitourinary: No CVA tenderness; Extremities: Pulses normal, No tenderness, No edema, No calf edema or asymmetry.; Neuro: AA&Ox3, Major CN grossly intact.   Speech clear. No gross focal motor or sensory deficits in extremities. Climbs on and off stretcher easily by herself. Gait steady.; Skin: Color normal, Warm, Dry.   ED Course  Procedures     EKG Interpretation None      MDM  MDM Reviewed: previous chart, nursing note and vitals Interpretation: x-ray      Dg Chest 2 View 12/21/2014   CLINICAL DATA:  Shortness of breath dry cough 2 days, anxiety  EXAM: CHEST  2 VIEW  COMPARISON:  11/21/2014  FINDINGS: The heart size and mediastinal contours are within normal limits. Both lungs are clear. The visualized skeletal structures are unremarkable.  IMPRESSION: No active cardiopulmonary disease.   Electronically Signed   By: Skipper Cliche M.D.   On: 12/21/2014 20:58    2155:  Neb and MDI given for coughing. Lungs CTA bilat, no wheezing, Sats 100% R/A, speaking full sentences with ease. CXR without infiltrate. Tx symptomatically for URI at this time. Dx and testing d/w pt and family.  Questions answered.  Verb understanding, agreeable to d/c home with outpt f/u.   Francine Graven, DO 12/24/14 [phone]

## 2014-12-21 NOTE — ED Notes (Signed)
Patient complaining of shortness of breath and persistent dry cough x 2 days. Reports has been taking claritin and mucinex dm with no relief.

## 2014-12-21 NOTE — Discharge Instructions (Signed)
°Emergency Department Resource Guide °1) Find a Doctor and Pay Out of Pocket °Although you won't have to find out who is covered by your insurance plan, it is a good idea to ask around and get recommendations. You will then need to call the office and see if the doctor you have chosen will accept you as a new patient and what types of options they offer for patients who are self-pay. Some doctors offer discounts or will set up payment plans for their patients who do not have insurance, but you will need to ask so you aren't surprised when you get to your appointment. ° °2) Contact Your Local Health Department °Not all health departments have doctors that can see patients for sick visits, but many do, so it is worth a call to see if yours does. If you don't know where your local health department is, you can check in your phone book. The CDC also has a tool to help you locate your state's health department, and many state websites also have listings of all of their local health departments. ° °3) Find a Walk-in Clinic °If your illness is not likely to be very severe or complicated, you may want to try a walk in clinic. These are popping up all over the country in pharmacies, drugstores, and shopping centers. They're usually staffed by nurse practitioners or physician assistants that have been trained to treat common illnesses and complaints. They're usually fairly quick and inexpensive. However, if you have serious medical issues or chronic medical problems, these are probably not your best option. ° °No Primary Care Doctor: °- Call [facility] at  832-8000 - they can help you locate a primary care doctor that  accepts your insurance, provides certain services, etc. °- Physician Referral Service- 1-800-533-3463 ° °Chronic Pain Problems: °Organization         Address  Phone   Notes  °[facility]  (336) 297-2271 Patients need to be referred by their primary care doctor.  ° °Medication  Assistance: °Organization         Address  Phone   Notes  °[place] [facility] [address] °[place], [place] [address] (336) 641-8030 --Must be a resident of [place] °-- Must have NO insurance coverage whatsoever (no Medicaid/ Medicare, etc.) °-- The pt. MUST have a primary care doctor that directs their care regularly and follows them in the community °  °[facility]  (866) 331-1348   °[employer]  (888) 892-1162   ° °Agencies that provide inexpensive medical care: °Organization         Address  Phone   Notes  °[facility]  (336) 832-8035   °[facility]    (336) 832-7272   °[facility] [address] °[address], [place] [address] (336) 832-4777   °[facility] [address], °[place] (336) 271-4999   °[facility]    (336) 373-0678   °[facility]    (336) 272-1050   °[facility] ° [address], [place] Phone:  (336) 832-4444, Fax:  (336) 832-4440 Hours of Operation:  9 am - 6 pm, M-F.  Also accepts Medicaid/Medicare and self-pay.  °[facility] ° [address], [place] Phone: (336) [phone], Fax: (336) [phone]. Hours of Operation:  8:30 am - 5:30 pm, M-F.  Also accepts Medicaid and self-pay.  °[facility] [place] [address], [place] Phone: (336) 878-6027   °[facility] [address], [place], [place] (336)723-1848, Ext. 123 Mondays & Thursdays: 7-9 AM.  First 15 patients are seen on a first come, first serve basis. °  ° °Medicaid-accepting [place] Providers: ° °Organization         Address  Phone   Notes  °[facility] [address], [place] (336) 641-2100 Also accepts self-pay patients.  °[facility] [address], [place] ° (336) 856-9996   °[facility] [address], [place]  (336) 288-8857   °[facility] [address] [place] [address], [place] (336) 299-7000   °Veita Bland [address], [place]  ° (336) 373-1557 Only accepts [place] Access Medicaid patients after they have their name applied to their card.  ° °Self-Pay (no insurance) in [place]: ° °Organization         Address  Phone   Notes  °Sickle Cell Patients, [facility] [address], [place] (336) 832-1970   °[facility] [address], [place] (336) 832-4400   °[facility] ° [address], [place] (336) 992-4800   °[facility]/Dr. Osei-Bonsu ° [address] [place] [address], [place] or [address], [place] (336) [phone] Phone number for both [place] and [place] locations is the same.  °[facility] [address], [place] (336) 299-0000   °[facility] [address] [place] [address], [place] or [address] (336) 852-7530 °(336) 878-2260   °[facility] [address], [place] (336) 350-1642, phone; (336) 294-5005, fax Sees patients 1st and 3rd Saturday of every month.  Must not qualify for public or private insurance (i.e. Medicaid, Medicare, [place] Health Choice, Veterans' Benefits) • Household income should be no more than 200% of the poverty level •The clinic cannot treat you if you are pregnant or think you are pregnant • Sexually transmitted diseases are not treated at the clinic.  ° ° °Dental Care: °Organization         Address  Phone  Notes  °[place] [facility] [address], [place] (336) 641-6152 Accepts children up to age 21 who are enrolled in Medicaid or [place] Health Choice; pregnant women with a Medicaid card; and children who have applied for Medicaid or [place] Health Choice, but were declined, whose parents can pay a reduced fee at time of service.  °[place]  [facility] [place]  [address], [place] (336) 641-7733 Accepts children up to age 21 who are enrolled in Medicaid or [place] Health Choice; pregnant women with a Medicaid card; and children who have applied for Medicaid or [place] Health Choice, but were declined, whose parents can pay a reduced fee at time of service.  °[facility] ° [address], [place] (336) 641-4533 Patients are seen by appointment only. Walk-ins are not accepted. [facility] will see patients 18 years of age and older. °Monday - Tuesday (8am-5pm) °Most Wednesdays (8:30-5pm) °$30 per visit, cash only  °[facility] ° [address], [place] (336) 641-4533 Patients are seen by appointment only. Walk-ins are not accepted. [facility] will see patients 18 years of age and older. °One   Wednesday Evening (Monthly: Volunteer Based).  $30 per visit, cash only  °[facility]  (919) 537-3737 for adults; Children under age 4, call Graduate Pediatric Dentistry at ([phone]) [phone]. Children aged 4-14, please call (919) [phone] to request a pediatric application. ° Dental services are provided in all areas of dental care including fillings, crowns and bridges, complete and partial dentures, implants, gum treatment, root canals, and extractions. Preventive care is also provided. Treatment is provided to both adults and children. °Patients are selected via a lottery and there is often a waiting list. °  °[facility] [address], °[place] ° (336) 763-8833 [email or web address] °  °[facility] [address], [place], [place] (336)723-1848, Ext. 123 Second and Fourth Thursday of each month, opens at 6:30 AM; Clinic ends at 9 AM.  Patients are seen on a first-come first-served basis, and a limited number are seen during each clinic.  ° °[facility] ° [address], [place], [place] (336) 723-7904    Eligibility Requirements °You must have lived in [place], [place], or [place] counties for at least the last three months. °  You cannot be eligible for state or federal sponsored healthcare insurance, including Veterans Administration, Medicaid, or Medicare. °  You generally cannot be eligible for healthcare insurance through your employer.  °  How to apply: °Eligibility screenings are held every Tuesday and Wednesday afternoon from 1:00 pm until 4:00 pm. You do not need an appointment for the interview!  °[facility] [address], [place], [place] 336-631-2330   °[facility]  336-342-8273   °[facility]  336-703-3100   °[facility]  336-570-6415   ° °Behavioral Health Resources in the Community: °Intensive Outpatient Programs °Organization         Address  Phone  Notes  °[facility] [address], [place], [place] 336-878-6098   °[facility] [address], [place], [place] 336-832-9800   °[facility] [address], [place], [place] ° 336-882-2125   °[facility] [address],  °[place], [place] 1-800-853-5163 or 336-641-4981   °Substance Abuse Resources °Organization         Address  Phone  Notes  °[facility]  336-882-2125   °[facility]  336-784-9470   °[facility]  336-285-9073   °[facility]  336-845-3988   °Residential & Outpatient Substance Abuse Program  1-800-659-3381   °Psychological Services °Organization         Address  Phone  Notes  °[facility]  336- 832-9600   °[facility]  336- 378-7881   °[facility] [address], [place] 1-800-853-5163 or 336-641-4981   ° °Mobile Crisis Teams °Organization         Address  Phone  Notes  °[facility], Mobile Crisis Care Unit  1-877-626-1772   °Assertive °[facility] ° [address]  [place], [place] 336-834-9664   °Nashly DeEsch [address] °[place] 336-554-5454   ° °Self-Help/Support Groups °Organization         Address  Phone             Notes  °[facility]  - variety of support groups  336- 373-1402 Call for more information  °Narcotics Anonymous (NA), [facility] [address], °[place]  2 meetings at this location  ° °  Residential Treatment Programs Organization         Address  Phone  Notes  [facility] [address]    [place]  1-213-235-2453   [facility]  [address] [place] [address], [place], [place]   [place] [employer], [place] 626-514-9806 Admissions: 8am-3pm M-F  [facility] [address]    [place], [place] 262-035-5974   [facility] [address] [place], [place], [place]   [facility] [address]  [place], [place]   [facility] Intensive Outpatient [address], [place], [place]   [facility] [address]  [place], [place] 1-859-269-7092 or 206-107-5149   [facility] [address] [place], [place] Accepts Medicaid  [facility] [address],  [place] 1-860-825-2715 Substance Abuse/Addiction Treatment   [facility]         Address  Phone  Notes  [facility]  715-193-0429   Domenic Schwab, PhD [address] [place], [place]   978-177-6686 or 914-644-6607   [place], [place] 562-112-9331   [facility] [address], [place], [place] (763)128-0445 Insurance/Medicaid/sponsorship through [facility] [address] [place]                                    [place], [place] 856-384-5595 [place] [address], [place] [phone]    Dr. Adele Schilder  854-085-8080   [facility] Dept. 1) [address], [place] 2) [place] 3)  [address], [place] [phone]  [phone]   [place] 239-044-5499 or [phone] (After Hours)      Take over the counter decongestant (such as sudafed), as well as an antihistamine (such as claritin, zyrtec or allegra), as directed on packaging, for the next week.  Use over the counter normal saline nasal spray, as instructed in the Emergency Department, several times per day for the next 2 weeks. Use your albuterol inhaler (2 to 4 puffs) every 4 hours for the next 7 days, then as needed for cough, wheezing, or shortness of breath.  Call your regular medical doctor tomorrow morning to schedule a follow up appointment within the next 3 days.  Return to the Emergency Department immediately sooner if worsening.

## 2014-12-21 NOTE — ED Notes (Signed)
Pt alert & oriented x4, stable gait. Patient given discharge instructions, paperwork & prescription(s). Patient  instructed to stop at the registration desk to finish any additional paperwork. Patient verbalized understanding. Pt left department w/ no further questions. 

## 2015-01-01 ENCOUNTER — Other Ambulatory Visit (HOSPITAL_COMMUNITY): Payer: Self-pay | Admitting: Internal Medicine

## 2015-01-01 DIAGNOSIS — Z1231 Encounter for screening mammogram for malignant neoplasm of breast: Secondary | ICD-10-CM

## 2015-01-08 ENCOUNTER — Encounter: Payer: BC Managed Care – PPO | Admitting: Nutrition

## 2015-01-26 ENCOUNTER — Ambulatory Visit (HOSPITAL_COMMUNITY)
Admission: RE | Admit: 2015-01-26 | Discharge: 2015-01-26 | Disposition: A | Discharge status: Home / Self Care | Payer: BC Managed Care – PPO | Source: Ambulatory Visit | Attending: Internal Medicine | Admitting: Internal Medicine

## 2015-01-26 DIAGNOSIS — Z1231 Encounter for screening mammogram for malignant neoplasm of breast: Secondary | ICD-10-CM | POA: Diagnosis not present

## 2015-02-25 ENCOUNTER — Ambulatory Visit: Payer: BC Managed Care – PPO | Admitting: Nutrition

## 2015-02-25 ENCOUNTER — Encounter: Payer: Self-pay | Admitting: Nutrition

## 2015-02-26 ENCOUNTER — Telehealth: Payer: Self-pay | Admitting: Nutrition

## 2015-02-26 NOTE — Telephone Encounter (Signed)
Called to reschedule missed appt. She notes she would call me back to reschedule when she had her schedule. PC

## 2015-03-03 ENCOUNTER — Other Ambulatory Visit (HOSPITAL_COMMUNITY)
Admission: RE | Admit: 2015-03-03 | Discharge: 2015-03-03 | Disposition: A | Discharge status: Home / Self Care | Payer: BC Managed Care – PPO | Source: Ambulatory Visit | Attending: Orthopaedic Surgery | Admitting: Orthopaedic Surgery

## 2015-03-03 DIAGNOSIS — M65341 Trigger finger, right ring finger: Secondary | ICD-10-CM | POA: Diagnosis not present

## 2015-03-23 HISTORY — PX: TRIGGER FINGER RELEASE: SHX641

## 2015-03-27 ENCOUNTER — Telehealth (HOSPITAL_BASED_OUTPATIENT_CLINIC_OR_DEPARTMENT_OTHER): Payer: Self-pay

## 2015-03-27 NOTE — Telephone Encounter (Signed)
Called pt to try to schedule appt but she said she isn't interested in scheduling an appointment and is now going to another physician.

## 2015-08-19 ENCOUNTER — Encounter: Payer: Self-pay | Admitting: Obstetrics and Gynecology

## 2015-08-19 ENCOUNTER — Ambulatory Visit (INDEPENDENT_AMBULATORY_CARE_PROVIDER_SITE_OTHER): Payer: BC Managed Care – PPO | Admitting: Obstetrics and Gynecology

## 2015-08-19 VITALS — BP 132/80 | HR 78 | Ht 62.25 in | Wt 184.2 lb

## 2015-08-19 DIAGNOSIS — Z1389 Encounter for screening for other disorder: Secondary | ICD-10-CM

## 2015-08-19 DIAGNOSIS — Z01419 Encounter for gynecological examination (general) (routine) without abnormal findings: Secondary | ICD-10-CM

## 2015-08-19 DIAGNOSIS — A6009 Herpesviral infection of other urogenital tract: Secondary | ICD-10-CM

## 2015-08-19 DIAGNOSIS — B009 Herpesviral infection, unspecified: Secondary | ICD-10-CM

## 2015-08-19 DIAGNOSIS — Z139 Encounter for screening, unspecified: Secondary | ICD-10-CM

## 2015-08-19 DIAGNOSIS — Z01411 Encounter for gynecological examination (general) (routine) with abnormal findings: Secondary | ICD-10-CM

## 2015-08-19 DIAGNOSIS — R350 Frequency of micturition: Secondary | ICD-10-CM

## 2015-08-19 DIAGNOSIS — Z1211 Encounter for screening for malignant neoplasm of colon: Secondary | ICD-10-CM

## 2015-08-19 DIAGNOSIS — E1169 Type 2 diabetes mellitus with other specified complication: Secondary | ICD-10-CM

## 2015-08-19 DIAGNOSIS — N898 Other specified noninflammatory disorders of vagina: Secondary | ICD-10-CM

## 2015-08-19 DIAGNOSIS — E669 Obesity, unspecified: Secondary | ICD-10-CM

## 2015-08-19 LAB — POCT URINALYSIS DIPSTICK
Ketones, UA: NEGATIVE
Leukocytes, UA: NEGATIVE
Nitrite, UA: NEGATIVE
Protein, UA: NEGATIVE
RBC UA: NEGATIVE

## 2015-08-19 LAB — HEMOCCULT GUIAC POC 1CARD (OFFICE): Fecal Occult Blood, POC: NEGATIVE

## 2015-08-19 LAB — POCT WET PREP WITH KOH: Yeast Wet Prep HPF POC: NEGATIVE

## 2015-08-19 MED ORDER — ACYCLOVIR 400 MG PO TABS: 400.0000 mg | ORAL_TABLET | Freq: Every day | ORAL | Status: DC

## 2015-08-19 NOTE — Patient Instructions (Signed)

## 2015-08-19 NOTE — Progress Notes (Signed)
Patient ID: Sarah [name], female   DOB: 12-23-1960, 54 y.o.   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 No need for pap smear return annually or prn 2. Rx Acyclovir 400  Five times daily x 5 d ref x 1 3    Annual mammogram advised 4   F/u culture for HSV Subjective:  Sarah [name] is a 54 y.o. female No obstetric history on file. who presents for annual exam. No LMP recorded. Patient has had a hysterectomy. The patient has complaints today of an outbreak of really tender sore area on vulva, pt suspects yeast , but has had good diabetic control with A1C now in 7.4 at last check. Has similar c/o in past  The following portions of the patient's history were reviewed and updated as appropriate: allergies, current medications, past family history, past medical history, past social history, past surgical history and problem list. Past Medical History  Diagnosis Date  . Diabetes mellitus   . Diverticulosis   . Hypercholesteremia   . Anxiety   . GERD (gastroesophageal reflux disease)     Past Surgical History  Procedure Laterality Date  . Abdominal hysterectomy      complete  . Foot surgery      right-tendon repair  . Colonoscopy  01/04/2010    left sided transverse diverticula/two diminutive rectal polyps  . Laparoscopic sigmoid colectomy  02/01/10    Dr Jola Babinski  . Colonoscopy  03/28/2012    Procedure: COLONOSCOPY;  Surgeon: Daneil Dolin, MD;  [facility];  Service: Endoscopy;  Laterality: N/A;  9:30  . Cardiac catheterization  2011  . Trigger finger release Left 03/2015     Current outpatient prescriptions:  .  albuterol (PROVENTIL HFA;VENTOLIN HFA) 108 (90 BASE) MCG/ACT inhaler, Inhale 1-2 puffs into the lungs every 6 (six) hours as needed for wheezing or shortness of breath., Disp: 1 Inhaler, Rfl: 0 .  albuterol (PROVENTIL) (2.5 MG/3ML) 0.083% nebulizer solution, Take 3 mLs (2.5 mg  total) by nebulization every 4 (four) hours as needed for wheezing., Disp: 30 vial, Rfl: 0 .  benzonatate (TESSALON) 100 MG capsule, Take 1 capsule (100 mg total) by mouth 3 (three) times daily as needed for cough., Disp: 15 capsule, Rfl: 0 .  diphenhydrAMINE (BENADRYL) 25 MG tablet, Take 25 mg by mouth at bedtime as needed for allergies or sleep. , Disp: , Rfl:  .  empagliflozin (JARDIANCE) 10 MG TABS tablet, Take 10 mg by mouth daily., Disp: , Rfl:  .  HYDROcodone-acetaminophen (NORCO) 7.5-325 MG per tablet, Take 1 tablet by mouth every 4 (four) hours as needed for moderate pain., Disp: 126 tablet, Rfl: 0 .  ibuprofen (ADVIL,MOTRIN) 600 MG tablet, Take 1 tablet (600 mg total) by mouth every 6 (six) hours as needed., Disp: 30 tablet, Rfl: 0 .  Insulin Degludec (TRESIBA FLEXTOUCH) 100 UNIT/ML SOPN, Inject into the skin. 34 units at bedtime, Disp: , Rfl:  .  loratadine (CLARITIN) 10 MG tablet, Take 10 mg by mouth daily as needed for allergies., Disp: , Rfl:  .  LORazepam (ATIVAN) 0.5 MG tablet, Take 0.5 mg by mouth as needed for anxiety., Disp: , Rfl:  .  metFORMIN (GLUCOPHAGE) 500 MG tablet, Take 500 mg by mouth 2 (two) times daily with a meal., Disp: , Rfl:  .  acyclovir (ZOVIRAX) 400 MG tablet, Take 1 tablet (400 mg total) by mouth 5 (five)  times daily. Take five times a day x 5 days, Disp: 25 tablet, Rfl: 1  Review of Systems Constitutional: positive for recurrent sore on bottom Gastrointestinal: negative Genitourinary: as above  Objective:  BP 132/80 mmHg  Pulse 78  Ht 5' 2.25" (1.581 m)  Wt 184 lb 3.2 oz (83.553 kg)  BMI 33.43 kg/m2   BMI: Body mass index is 33.43 kg/(m^2).  General Appearance: Alert, appropriate appearance for age. No acute distress HEENT: Grossly normal Neck / Thyroid:  Cardiovascular: RRR; normal S1, S2, no murmur Lungs: CTA bilaterally Back: No CVAT Breast Exam: No dimpling, nipple retraction or discharge. No masses or nodes. and No masses or nodes.No dimpling,  nipple retraction or discharge. Gastrointestinal: Soft, non-tender, no masses or organomegaly  Pelvic Exam:  pt with 3 tiny punctate healing lesions 2 mm diabmeter on the left labium majora parallel tointroitus External genitalia: normal general appearance Urinary system: urethral meatus normal Vaginal: normal mucosa without prolapse or lesions Cervix: absent and removed surgically Adnexa: normal bimanual exam Uterus: removed surgically Rectal: good sphincter tone, no masses and guaiac negative Exam limited by anxiety and body habitus Rectovaginal:  normal rectal, no masses and guaiac negative stool obtained Lymphatic Exam: Non-palpable nodes in neck, clavicular, axillary, or inguinal regions Skin: no rash or abnormalities Neurologic: Normal gait and speech, no tremor  Psychiatric: Alert and oriented, appropriate affect.  Urinalysis:Not done Wet prep neg for yeast. Mallory Shirk. MD Pgr [phone] 7:22 PM

## 2015-08-23 LAB — HERPES SIMPLEX VIRUS CULTURE

## 2015-08-26 ENCOUNTER — Ambulatory Visit (INDEPENDENT_AMBULATORY_CARE_PROVIDER_SITE_OTHER): Payer: BC Managed Care – PPO | Admitting: Obstetrics and Gynecology

## 2015-08-26 ENCOUNTER — Encounter: Payer: Self-pay | Admitting: Obstetrics and Gynecology

## 2015-08-26 VITALS — BP 140/80 | Ht 62.0 in | Wt 188.0 lb

## 2015-08-26 DIAGNOSIS — R102 Pelvic and perineal pain: Secondary | ICD-10-CM | POA: Diagnosis not present

## 2015-08-26 DIAGNOSIS — B009 Herpesviral infection, unspecified: Secondary | ICD-10-CM | POA: Diagnosis not present

## 2015-08-26 MED ORDER — MICONAZOLE NITRATE 2 % VA CREA: 1.0000 | TOPICAL_CREAM | Freq: Every day | VAGINAL | Status: DC

## 2015-08-26 NOTE — Progress Notes (Signed)
Patient ID: Sarah [name], female   DOB: 01/25/1961, 55 y.o.   MRN: [id number] Pt here today for follow up and to get results.

## 2015-08-26 NOTE — Progress Notes (Signed)
Patient ID: Sarah [name], female   DOB: Jan 11, 1961, 54 y.o.   MRN: [id number]   [facility] Visit  Patient name: Sarah [name] MRN [id number]  Date of birth: [birth year]  CC & HPI:  Sarah [name] is a 55 y.o. female presenting today for followup vulvar lesion had neg HSV culture. Lesion and pain are resolved.  ROS:    Pertinent History Reviewed:   Reviewed: Significant for culture negative. For hsv Medical         Past Medical History  Diagnosis Date  . Diabetes mellitus   . Diverticulosis   . Hypercholesteremia   . Anxiety   . GERD (gastroesophageal reflux disease)                               Surgical Hx:    Past Surgical History  Procedure Laterality Date  . Abdominal hysterectomy      complete  . Foot surgery      right-tendon repair  . Colonoscopy  01/04/2010    left sided transverse diverticula/two diminutive rectal polyps  . Laparoscopic sigmoid colectomy  02/01/10    Dr Jola Babinski  . Colonoscopy  03/28/2012    Procedure: COLONOSCOPY;  Surgeon: Daneil Dolin, MD;  [facility];  Service: Endoscopy;  Laterality: N/A;  9:30  . Cardiac catheterization  2011  . Trigger finger release Left 03/2015   Medications: Reviewed & Updated - see associated section                       Current outpatient prescriptions:  .  acyclovir (ZOVIRAX) 400 MG tablet, Take 1 tablet (400 mg total) by mouth 5 (five) times daily. Take five times a day x 5 days, Disp: 25 tablet, Rfl: 1 .  albuterol (PROVENTIL HFA;VENTOLIN HFA) 108 (90 BASE) MCG/ACT inhaler, Inhale 1-2 puffs into the lungs every 6 (six) hours as needed for wheezing or shortness of breath., Disp: 1 Inhaler, Rfl: 0 .  albuterol (PROVENTIL) (2.5 MG/3ML) 0.083% nebulizer solution, Take 3 mLs (2.5 mg total) by nebulization every 4 (four) hours as needed for wheezing., Disp: 30 vial, Rfl: 0 .  benzonatate (TESSALON) 100 MG capsule, Take 1 capsule (100 mg total) by mouth 3 (three) times daily as  needed for cough., Disp: 15 capsule, Rfl: 0 .  diphenhydrAMINE (BENADRYL) 25 MG tablet, Take 25 mg by mouth at bedtime as needed for allergies or sleep. , Disp: , Rfl:  .  empagliflozin (JARDIANCE) 10 MG TABS tablet, Take 10 mg by mouth daily., Disp: , Rfl:  .  HYDROcodone-acetaminophen (NORCO) 7.5-325 MG per tablet, Take 1 tablet by mouth every 4 (four) hours as needed for moderate pain., Disp: 126 tablet, Rfl: 0 .  ibuprofen (ADVIL,MOTRIN) 600 MG tablet, Take 1 tablet (600 mg total) by mouth every 6 (six) hours as needed., Disp: 30 tablet, Rfl: 0 .  Insulin Degludec (TRESIBA FLEXTOUCH) 100 UNIT/ML SOPN, Inject into the skin. 34 units at bedtime, Disp: , Rfl:  .  loratadine (CLARITIN) 10 MG tablet, Take 10 mg by mouth daily as needed for allergies., Disp: , Rfl:  .  LORazepam (ATIVAN) 0.5 MG tablet, Take 0.5 mg by mouth as needed for anxiety., Disp: , Rfl:  .  metFORMIN (GLUCOPHAGE) 500 MG tablet, Take 500 mg by mouth 2 (two) times daily with a meal., Disp: , Rfl:    Social History: Reviewed -  reports that she has quit smoking. Her smoking use included Cigarettes. She has a 10 pack-year smoking history. She has never used smokeless tobacco.  Objective Findings:  Vitals: Blood pressure 140/80, height 5\' 2"  (1.575 m), weight 188 lb (85.276 kg).  Physical Examination: General appearance - alert, well appearing, and in no distress, oriented to person, place, and time and overweight Mental status - alert, oriented to person, place, and time Eyes - pupils equal and reactive, extraocular eye movements intact Chest - clear to auscultation, no wheezes, rales or rhonchi, symmetric air entry Abdomen - soft, nontender, nondistended, no masses or organomegaly Pelvic declined by pt.  Assessment & Plan:   A:  1. Resolved vulvar lesion 2. Negative HSV culture.  P:  1. 1. Pt aware of neg results 2. Pt to come in early for next flareup 3  Monistat prn vulvar irritation.

## 2015-08-31 ENCOUNTER — Other Ambulatory Visit: Payer: Self-pay | Admitting: "Endocrinology

## 2015-09-01 ENCOUNTER — Other Ambulatory Visit: Payer: Self-pay | Admitting: "Endocrinology

## 2016-02-03 ENCOUNTER — Encounter: Payer: Self-pay | Admitting: Orthopaedic Surgery

## 2016-02-03 ENCOUNTER — Ambulatory Visit (INDEPENDENT_AMBULATORY_CARE_PROVIDER_SITE_OTHER): Payer: BC Managed Care – PPO

## 2016-02-03 ENCOUNTER — Ambulatory Visit (INDEPENDENT_AMBULATORY_CARE_PROVIDER_SITE_OTHER): Payer: BC Managed Care – PPO | Admitting: Orthopaedic Surgery

## 2016-02-03 VITALS — BP 128/73 | HR 101 | Temp 97.2°F | Ht 63.0 in | Wt 183.8 lb

## 2016-02-03 DIAGNOSIS — M79671 Pain in right foot: Secondary | ICD-10-CM | POA: Diagnosis not present

## 2016-02-03 DIAGNOSIS — M6530 Trigger finger, unspecified finger: Secondary | ICD-10-CM | POA: Diagnosis not present

## 2016-02-03 MED ORDER — HYDROCODONE-ACETAMINOPHEN 5-325 MG PO TABS: 1.0000 | ORAL_TABLET | ORAL | Status: DC | PRN

## 2016-02-03 NOTE — Progress Notes (Signed)
Patient UB:[id number] Sarah [name], female DOB:[birth year], 55 y.o. [id number]  Chief Complaint  Patient presents with  . Hand Pain    Right middle  . Foot Pain    Right    HPI  Sarah [name] is a 55 y.o. female who has two problems today:  1.  Trigger finger of the right long finger that has been present for several weeks, getting worse.  She has locking in flexion that is becoming more and more of a problem.  She had a trigger finger that I did surgery on on the left hand in the past.  She would like to have surgery on the finger over an injection.  I do not do surgery any more and will have Dr. Aline Brochure see her for this.  2.  She has pain of the right heel with pain first thing in the morning and after sitting a while.  She has no redness.  She has no inserts.  She has used ice and taken Advil with little help.  She has no trauma.  HPI  Body mass index is 32.57 kg/(m^2).  ROS  Review of Systems  HENT: Negative for congestion.   Respiratory: Negative for cough and shortness of breath.   Cardiovascular: Negative for chest pain and leg swelling.  Endocrine: Negative for cold intolerance.  Musculoskeletal: Positive for joint swelling and arthralgias.  Allergic/Immunologic: Positive for environmental allergies.    Past Medical History  Diagnosis Date  . Diabetes mellitus   . Diverticulosis   . Hypercholesteremia   . Anxiety   . GERD (gastroesophageal reflux disease)     Past Surgical History  Procedure Laterality Date  . Abdominal hysterectomy      complete  . Foot surgery      right-tendon repair  . Colonoscopy  01/04/2010    left sided transverse diverticula/two diminutive rectal polyps  . Laparoscopic sigmoid colectomy  02/01/10    Dr Jola Babinski  . Colonoscopy  03/28/2012    Procedure: COLONOSCOPY;  Surgeon: Daneil Dolin, MD;  Location: [facility];  Service: Endoscopy;  Laterality: N/A;  9:30  . Cardiac catheterization  2011  . Trigger finger release  Left 03/2015    Family History  Problem Relation Age of Onset  . Colon cancer Maternal Grandfather   . Colon polyps Mother   . Diabetes Mother   . Hypertension Mother   . Heart attack Father   . Heart murmur Sister   . Hypertension Brother   . Diabetes Son     Social History Social History  Substance Use Topics  . Smoking status: Former Smoker -- 1.00 packs/day for 10 years    Types: Cigarettes  . Smokeless tobacco: Never Used  . Alcohol Use: Yes     Comment: occasional    Allergies  Allergen Reactions  . Sulfonamide Derivatives Itching  . Latex Itching and Rash    Powdered ONly    Current Outpatient Prescriptions  Medication Sig Dispense Refill  . albuterol (PROVENTIL HFA;VENTOLIN HFA) 108 (90 BASE) MCG/ACT inhaler Inhale 1-2 puffs into the lungs every 6 (six) hours as needed for wheezing or shortness of breath. 1 Inhaler 0  . albuterol (PROVENTIL) (2.5 MG/3ML) 0.083% nebulizer solution Take 3 mLs (2.5 mg total) by nebulization every 4 (four) hours as needed for wheezing. 30 vial 0  . benzonatate (TESSALON) 100 MG capsule Take 1 capsule (100 mg total) by mouth 3 (three) times daily as needed for cough. 15 capsule 0  . diphenhydrAMINE (BENADRYL)  25 MG tablet Take 25 mg by mouth at bedtime as needed for allergies or sleep.     . empagliflozin (JARDIANCE) 10 MG TABS tablet Take 10 mg by mouth daily.    Marland Kitchen ibuprofen (ADVIL,MOTRIN) 600 MG tablet Take 1 tablet (600 mg total) by mouth every 6 (six) hours as needed. 30 tablet 0  . Insulin Degludec (TRESIBA FLEXTOUCH) 100 UNIT/ML SOPN Inject into the skin. 34 units at bedtime    . loratadine (CLARITIN) 10 MG tablet Take 10 mg by mouth daily as needed for allergies.    Marland Kitchen LORazepam (ATIVAN) 0.5 MG tablet Take 0.5 mg by mouth as needed for anxiety.    . metFORMIN (GLUCOPHAGE) 500 MG tablet Take 500 mg by mouth 2 (two) times daily with a meal.    . miconazole (MONISTAT 7) 2 % vaginal cream Place 1 Applicatorful vaginally at bedtime. 45  g prn  . HYDROcodone-acetaminophen (NORCO/VICODIN) 5-325 MG tablet Take 1 tablet by mouth every 4 (four) hours as needed for moderate pain (Must last 30 days.  Do not take and drive a car or use machinery.). 120 tablet 0   No current facility-administered medications for this visit.     Physical Exam  Blood pressure 128/73, pulse 101, temperature 97.2 F (36.2 C), height 5\' 3"  (1.6 m), weight 183 lb 12.8 oz (83.371 kg).  Constitutional: overall normal hygiene, normal nutrition, well developed, normal grooming, normal body habitus. Assistive device:none  Musculoskeletal: gait and station Limp right, muscle tone and strength are normal, no tremors or atrophy is present.  .  Neurological: coordination overall normal.  Deep tendon reflex/nerve stretch intact.  Sensation normal.  Cranial nerves II-XII intact.   Skin:   normal overall no scars, lesions, ulcers or rashes. No psoriasis.  Psychiatric: Alert and oriented x 3.  Recent memory intact, remote memory unclear.  Normal mood and affect. Well groomed.  Good eye contact.  Cardiovascular: overall no swelling, no varicosities, no edema bilaterally, normal temperatures of the legs and arms, no clubbing, cyanosis and good capillary refill.  Lymphatic: palpation is normal.  She has triggering of the right long finger in flexion with pain over the A1 pulley.  NV is intact.  The other fingers on the right hand and the left hand do not trigger or lock.  She has pain of the right heel on the plantar side with no redness.  She has no swelling.  Gait is a slight limp on the right.  The left heel is normal.  The patient has been educated about the nature of the problem(s) and counseled on treatment options.  The patient appeared to understand what I have discussed and is in agreement with it.  Encounter Diagnoses  Name Primary?  . Right foot pain Yes  . Trigger finger, acquired     PLAN Call if any problems.  Precautions discussed.  Continue  current medications.   Return to clinic see Dr. Aline Brochure for trigger finger surgery.   Do stretching exercises for the right foot and heel.  I have explained this to her.  Electronically Signed Sanjuana Kava, MD 6/14/20179:07 PM

## 2016-02-16 ENCOUNTER — Ambulatory Visit (INDEPENDENT_AMBULATORY_CARE_PROVIDER_SITE_OTHER): Payer: BC Managed Care – PPO | Admitting: Orthopedic Surgery

## 2016-02-16 ENCOUNTER — Encounter: Payer: Self-pay | Admitting: Orthopedic Surgery

## 2016-02-16 VITALS — BP 126/76 | HR 84 | Ht 63.5 in | Wt 182.0 lb

## 2016-02-16 DIAGNOSIS — M6530 Trigger finger, unspecified finger: Secondary | ICD-10-CM

## 2016-02-16 NOTE — Progress Notes (Signed)
Patient ID: LEZLY TAWNEY, female   DOB: 10/13/1960, 55 y.o.   MRN: [id number]  Chief Complaint  Patient presents with  . Referral    for surgery from [facility], right trigger finger    HPI pain over the A1 pulley of the right long finger. Patient requests surgical intervention unresponsive to her medication and nonoperative measures. Seems to be bothering her a lot catching locking in the flexed position. Timing constant Duration several months   Review of Systems  Constitutional: Negative for fever and chills.  Neurological: Negative for tingling and sensory change.    Past Medical History  Diagnosis Date  . Diabetes mellitus   . Diverticulosis   . Hypercholesteremia   . Anxiety   . GERD (gastroesophageal reflux disease)     BP 126/76 mmHg  Pulse 84  Ht 5' 3.5" (1.613 m)  Wt 182 lb (82.555 kg)  BMI 31.73 kg/m2  Physical Exam Physical Exam  Constitutional: The patient appears well-developed and well-nourished. No distress.  The patient is oriented to person, place, and time.  Psychiatric: The patient has a normal mood and affect.  Cardiovascular: Intact distal pulses.   Neurological: sensation is normal  Skin: Skin is warm and dry. No rash noted. The patient is not diaphoretic. No erythema. No pallor.    Ortho Exam  Tenderness noted over the A1 pulley normal flexion of the flexor tendon normal strength the flexor tendon normal stability in all joints of the digits and intact good color and pulse of the radius color is normal in the hand sensation is normal epitrochlear lymph nodes are negative  ASSESSMENT AND PLAN   Right long finger trigger phenomenon  Right long finger trigger release Arther Abbott, MD 02/16/2016 10:42 AM

## 2016-02-16 NOTE — Patient Instructions (Addendum)
Schedule for trigger release right long finger  Schedule appt to see Dr Luna Glasgow for plantar fascia follow up visit   Trigger Finger Trigger finger (digital tendinitis and stenosing tenosynovitis) is a common disorder that causes an often painful catching of the fingers or thumb. It occurs as a clicking, snapping, or locking of a finger in the palm of the hand. This is caused by a problem with the tendons that flex or bend the fingers sliding smoothly through their sheaths. The condition may occur in any finger or a couple fingers at the same time.  The finger may lock with the finger curled or suddenly straighten out with a snap. This is more common in patients with rheumatoid arthritis and diabetes. Left untreated, the condition may get worse to the point where the finger becomes locked in flexion, like making a fist, or less commonly locked with the finger straightened out. CAUSES   Inflammation and scarring that lead to swelling around the tendon sheath.  Repeated or forceful movements.  Rheumatoid arthritis, an autoimmune disease that affects joints.  Gout.  Diabetes mellitus. SIGNS AND SYMPTOMS  Soreness and swelling of your finger.  A painful clicking or snapping as you bend and straighten your finger. DIAGNOSIS  Your health care provider will do a physical exam of your finger to diagnose trigger finger. TREATMENT   Splinting for 6-8 weeks may be helpful.  Nonsteroidal anti-inflammatory medicines (NSAIDs) can help to relieve the pain and inflammation.  Cortisone injections, along with splinting, may speed up recovery. Several injections may be required. Cortisone may give relief after one injection.  Surgery is another treatment that may be used if conservative treatments do not work. Surgery can be minor, without incisions (a cut does not have to be made), and can be done with a needle through the skin.  Other surgical choices involve an open procedure in which the surgeon  opens the hand through a small incision and cuts the pulley so the tendon can again slide smoothly. Your hand will still work fine. HOME CARE INSTRUCTIONS  Apply ice to the injured area, twice per day:  Put ice in a plastic bag.  Place a towel between your skin and the bag.  Leave the ice on for 20 minutes, 3-4 times a day.  Rest your hand often. MAKE SURE YOU:   Understand these instructions.  Will watch your condition.  Will get help right away if you are not doing well or get worse.   This information is not intended to replace advice given to you by your health care provider. Make sure you discuss any questions you have with your health care provider.   Document Released: 05/28/2004 Document Revised: 04/10/2013 Document Reviewed: 01/08/2013 Elsevier Interactive Patient Education [employer].

## 2016-02-19 NOTE — Patient Instructions (Addendum)
Sarah [name]  02/19/2016     @PREFPERIOPPHARMACY @   Your procedure is scheduled on 02/25/2016.  Report to [facility] at 9;00 A.M.  Call this number if you have problems the morning of surgery:  [phone]   Remember:  Do not eat food or drink liquids after midnight.  Take these medicines the morning of surgery with A SIP OF WATER : [place].  PLEASE USE YOUR INHALERS AND NEBULIZERS BEFORE LEAVING HOME AND BRING THEM WITH YOU TO [facility].  DO NOT TAKE DIABETIC MEDICATIONS AM OF SURGERY  ONLY 1/2 DOSE OF EVENING INSULIN NIGHT PRIOR   Do not wear jewelry, make-up or nail polish.  Do not wear lotions, powders, or perfumes.  You may wear deoderant.  Do not shave 48 hours prior to surgery.  Men may shave face and neck.  Do not bring valuables to the hospital.  [facility] is not responsible for any belongings or valuables.  Contacts, dentures or bridgework may not be worn into surgery.  Leave your suitcase in the car.  After surgery it may be brought to your room.  For patients admitted to the hospital, discharge time will be determined by your treatment team.  Patients discharged the day of surgery will not be allowed to drive home.   Name and phone number of your driver:   FAMILY Special instructions:  N/A  Please read over the following fact sheets that you were given. Care and Recovery After Surgery   Trigger Finger Trigger finger (digital tendinitis and stenosing tenosynovitis) is a common disorder that causes an often painful catching of the fingers or thumb. It occurs as a clicking, snapping, or locking of a finger in the palm of the hand. This is caused by a problem with the tendons that flex or bend the fingers sliding smoothly through their sheaths. The condition may occur in any finger or a couple fingers at the same time.  The finger may lock with the finger curled or suddenly straighten out with a snap. This is more common in patients with rheumatoid  arthritis and diabetes. Left untreated, the condition may get worse to the point where the finger becomes locked in flexion, like making a fist, or less commonly locked with the finger straightened out. CAUSES   Inflammation and scarring that lead to swelling around the tendon sheath.  Repeated or forceful movements.  Rheumatoid arthritis, an autoimmune disease that affects joints.  Gout.  Diabetes mellitus. SIGNS AND SYMPTOMS  Soreness and swelling of your finger.  A painful clicking or snapping as you bend and straighten your finger. DIAGNOSIS  Your health care provider will do a physical exam of your finger to diagnose trigger finger. TREATMENT   Splinting for 6-8 weeks may be helpful.  Nonsteroidal anti-inflammatory medicines (NSAIDs) can help to relieve the pain and inflammation.  Cortisone injections, along with splinting, may speed up recovery. Several injections may be required. Cortisone may give relief after one injection.  Surgery is another treatment that may be used if conservative treatments do not work. Surgery can be minor, without incisions (a cut does not have to be made), and can be done with a needle through the skin.  Other surgical choices involve an open procedure in which the surgeon opens the hand through a small incision and cuts the pulley so the tendon can again slide smoothly. Your hand will still work fine. HOME CARE INSTRUCTIONS  Apply ice to the injured area, twice per day:  Put ice in a plastic bag.  Place a towel between your skin and the bag.  Leave the ice on for 20 minutes, 3-4 times a day.  Rest your hand often. MAKE SURE YOU:   Understand these instructions.  Will watch your condition.  Will get help right away if you are not doing well or get worse.   This information is not intended to replace advice given to you by your health care provider. Make sure you discuss any questions you have with your health care provider.    Document Released: 05/28/2004 Document Revised: 04/10/2013 Document Reviewed: 01/08/2013 Elsevier Interactive Patient Education 2016 [place] Anesthesia, Adult General anesthesia is a sleep-like state of non-feeling produced by medicines (anesthetics). General anesthesia prevents you from being alert and feeling pain during a medical procedure. Your caregiver may recommend general anesthesia if your procedure:  Is long.  Is painful or uncomfortable.  Would be frightening to see or hear.  Requires you to be still.  Affects your breathing.  Causes significant blood loss. LET YOUR CAREGIVER KNOW ABOUT:  Allergies to food or medicine.  Medicines taken, including vitamins, herbs, eyedrops, over-the-counter medicines, and creams.  Use of steroids (by mouth or creams).  Previous problems with anesthetics or numbing medicines, including problems experienced by relatives.  History of bleeding problems or blood clots.  Previous surgeries and types of anesthetics received.  Possibility of pregnancy, if this applies.  Use of cigarettes, alcohol, or illegal drugs.  Any health condition(s), especially diabetes, sleep apnea, and high blood pressure. RISKS AND COMPLICATIONS General anesthesia rarely causes complications. However, if complications do occur, they can be life threatening. Complications include:  A lung infection.  A stroke.  A heart attack.  Waking up during the procedure. When this occurs, the patient may be unable to move and communicate that he or she is awake. The patient may feel severe pain. Older adults and adults with serious medical problems are more likely to have complications than adults who are young and healthy. Some complications can be prevented by answering all of your caregiver's questions thoroughly and by following all pre-procedure instructions. It is important to tell your caregiver if any of the pre-procedure instructions, especially  those related to diet, were not followed. Any food or liquid in the stomach can cause problems when you are under general anesthesia. BEFORE THE PROCEDURE  Ask your caregiver if you will have to spend the night at the hospital. If you will not have to spend the night, arrange to have an adult drive you and stay with you for 24 hours.  Follow your caregiver's instructions if you are taking dietary supplements or medicines. Your caregiver may tell you to stop taking them or to reduce your dosage.  Do not smoke for as long as possible before your procedure. If possible, stop smoking 3-6 weeks before the procedure.  Do not take new dietary supplements or medicines within 1 week of your procedure unless your caregiver approves them.  Do not eat within 8 hours of your procedure or as directed by your caregiver. Drink only clear liquids, such as water, black coffee (without milk or cream), and fruit juices (without pulp).  Do not drink within 3 hours of your procedure or as directed by your caregiver.  You may brush your teeth on the morning of the procedure, but make sure to spit out the toothpaste and water when finished. PROCEDURE  You will receive anesthetics through a mask, through an intravenous (  IV) access tube, or through both. A doctor who specializes in anesthesia (anesthesiologist) or a nurse who specializes in anesthesia (nurse anesthetist) or both will stay with you throughout the procedure to make sure you remain unconscious. He or she will also watch your blood pressure, pulse, and oxygen levels to make sure that the anesthetics do not cause any problems. Once you are asleep, a breathing tube or mask may be used to help you breathe. AFTER THE PROCEDURE You will wake up after the procedure is complete. You may be in the room where the procedure was performed or in a recovery area. You may have a sore throat if a breathing tube was used. You may also  feel:  Dizzy.  Weak.  Drowsy.  Confused.  Nauseous.  Cold. These are all normal responses and can be expected to last for up to 24 hours after the procedure is complete. A caregiver will tell you when you are ready to go home. This will usually be when you are fully awake and in stable condition.   This information is not intended to replace advice given to you by your health care provider. Make sure you discuss any questions you have with your health care provider.   Document Released: 11/15/2007 Document Revised: 08/29/2014 Document Reviewed: 12/07/2011 Elsevier Interactive Patient Education [employer].

## 2016-02-22 ENCOUNTER — Encounter (HOSPITAL_COMMUNITY): Payer: Self-pay

## 2016-02-22 ENCOUNTER — Other Ambulatory Visit: Payer: Self-pay

## 2016-02-22 ENCOUNTER — Encounter (HOSPITAL_COMMUNITY)
Admission: RE | Admit: 2016-02-22 | Discharge: 2016-02-22 | Disposition: A | Discharge status: Home / Self Care | Payer: BC Managed Care – PPO | Source: Ambulatory Visit | Attending: Orthopedic Surgery | Admitting: Orthopedic Surgery

## 2016-02-22 DIAGNOSIS — K219 Gastro-esophageal reflux disease without esophagitis: Secondary | ICD-10-CM | POA: Diagnosis not present

## 2016-02-22 DIAGNOSIS — M65331 Trigger finger, right middle finger: Secondary | ICD-10-CM | POA: Diagnosis not present

## 2016-02-22 DIAGNOSIS — E119 Type 2 diabetes mellitus without complications: Secondary | ICD-10-CM | POA: Diagnosis not present

## 2016-02-22 DIAGNOSIS — K5790 Diverticulosis of intestine, part unspecified, without perforation or abscess without bleeding: Secondary | ICD-10-CM | POA: Diagnosis not present

## 2016-02-22 DIAGNOSIS — M6530 Trigger finger, unspecified finger: Secondary | ICD-10-CM | POA: Diagnosis present

## 2016-02-22 DIAGNOSIS — E7800 Pure hypercholesterolemia, unspecified: Secondary | ICD-10-CM | POA: Diagnosis not present

## 2016-02-22 DIAGNOSIS — F419 Anxiety disorder, unspecified: Secondary | ICD-10-CM | POA: Diagnosis not present

## 2016-02-22 DIAGNOSIS — Z87891 Personal history of nicotine dependence: Secondary | ICD-10-CM | POA: Diagnosis not present

## 2016-02-22 LAB — SURGICAL PCR SCREEN
MRSA, PCR: NEGATIVE
Staphylococcus aureus: POSITIVE — AB

## 2016-02-22 LAB — BASIC METABOLIC PANEL
Anion gap: 7 (ref 5–15)
BUN: 14 mg/dL (ref 6–20)
CALCIUM: 9.1 mg/dL (ref 8.9–10.3)
CO2: 26 mmol/L (ref 22–32)
CREATININE: 0.62 mg/dL (ref 0.44–1.00)
Chloride: 106 mmol/L (ref 101–111)
GFR calc Af Amer: >60 mL/min (ref >60)
GLUCOSE: 123 mg/dL — AB (ref 65–99)
Potassium: 4.2 mmol/L (ref 3.5–5.1)
Sodium: 139 mmol/L (ref 135–145)

## 2016-02-22 LAB — CBC
HCT: 39.8 % (ref 36.0–46.0)
Hemoglobin: 14 g/dL (ref 12.0–15.0)
MCH: 28.6 pg (ref 26.0–34.0)
MCHC: 35.2 g/dL (ref 30.0–36.0)
MCV: 81.2 fL (ref 78.0–100.0)
PLATELETS: 268 10*3/uL (ref 150–400)
RBC: 4.9 MIL/uL (ref 3.87–5.11)
RDW: 14.3 % (ref 11.5–15.5)
WBC: 7.3 10*3/uL (ref 4.0–10.5)

## 2016-02-25 ENCOUNTER — Encounter (HOSPITAL_COMMUNITY)
Admission: RE | Disposition: A | Discharge status: Home / Self Care | Payer: Self-pay | Source: Ambulatory Visit | Attending: Orthopedic Surgery

## 2016-02-25 ENCOUNTER — Ambulatory Visit (HOSPITAL_COMMUNITY)
Admission: RE | Admit: 2016-02-25 | Discharge: 2016-02-25 | Disposition: A | Discharge status: Home / Self Care | Payer: BC Managed Care – PPO | Source: Ambulatory Visit | Attending: Orthopedic Surgery | Admitting: Orthopedic Surgery

## 2016-02-25 ENCOUNTER — Ambulatory Visit (HOSPITAL_COMMUNITY): Payer: BC Managed Care – PPO | Admitting: Anesthesiology

## 2016-02-25 ENCOUNTER — Encounter (HOSPITAL_COMMUNITY): Payer: Self-pay | Admitting: *Deleted

## 2016-02-25 DIAGNOSIS — M65331 Trigger finger, right middle finger: Secondary | ICD-10-CM | POA: Insufficient documentation

## 2016-02-25 DIAGNOSIS — K219 Gastro-esophageal reflux disease without esophagitis: Secondary | ICD-10-CM | POA: Insufficient documentation

## 2016-02-25 DIAGNOSIS — Z87891 Personal history of nicotine dependence: Secondary | ICD-10-CM | POA: Insufficient documentation

## 2016-02-25 DIAGNOSIS — K5790 Diverticulosis of intestine, part unspecified, without perforation or abscess without bleeding: Secondary | ICD-10-CM | POA: Insufficient documentation

## 2016-02-25 DIAGNOSIS — F419 Anxiety disorder, unspecified: Secondary | ICD-10-CM | POA: Insufficient documentation

## 2016-02-25 DIAGNOSIS — E119 Type 2 diabetes mellitus without complications: Secondary | ICD-10-CM | POA: Insufficient documentation

## 2016-02-25 DIAGNOSIS — E7800 Pure hypercholesterolemia, unspecified: Secondary | ICD-10-CM | POA: Insufficient documentation

## 2016-02-25 DIAGNOSIS — M65339 Trigger finger, unspecified middle finger: Secondary | ICD-10-CM | POA: Insufficient documentation

## 2016-02-25 HISTORY — PX: TRIGGER FINGER RELEASE: SHX641

## 2016-02-25 LAB — GLUCOSE, CAPILLARY
Glucose-Capillary: 126 mg/dL — ABNORMAL HIGH (ref 65–99)
Glucose-Capillary: 118 mg/dL — ABNORMAL HIGH (ref 65–99)

## 2016-02-25 SURGERY — RELEASE, A1 PULLEY, FOR TRIGGER FINGER
Anesthesia: Regional | Laterality: Right

## 2016-02-25 MED ORDER — PROPOFOL 10 MG/ML IV BOLUS
INTRAVENOUS | Status: AC
  Filled 2016-02-25: qty 20

## 2016-02-25 MED ORDER — FENTANYL CITRATE (PF) 100 MCG/2ML IJ SOLN
INTRAMUSCULAR | Status: AC
  Filled 2016-02-25: qty 2

## 2016-02-25 MED ORDER — ONDANSETRON HCL 4 MG/2ML IJ SOLN
INTRAMUSCULAR | Status: AC
  Filled 2016-02-25: qty 2

## 2016-02-25 MED ORDER — FENTANYL CITRATE (PF) 100 MCG/2ML IJ SOLN
25.0000 ug | INTRAMUSCULAR | Status: DC | PRN
Start: 2016-02-25 — End: 2016-02-27

## 2016-02-25 MED ORDER — LIDOCAINE HCL (CARDIAC) 10 MG/ML IV SOLN
INTRAVENOUS | Status: DC | PRN
  Administered 2016-02-25: 25 mg via INTRAVENOUS

## 2016-02-25 MED ORDER — FENTANYL CITRATE (PF) 100 MCG/2ML IJ SOLN
INTRAMUSCULAR | Status: DC | PRN
  Administered 2016-02-25 (×4): 25 ug via INTRAVENOUS

## 2016-02-25 MED ORDER — MIDAZOLAM HCL 2 MG/2ML IJ SOLN
INTRAMUSCULAR | Status: AC
  Filled 2016-02-25: qty 2

## 2016-02-25 MED ORDER — FENTANYL CITRATE (PF) 100 MCG/2ML IJ SOLN
25.0000 ug | INTRAMUSCULAR | Status: AC | PRN
  Administered 2016-02-25 (×2): 25 ug via INTRAVENOUS

## 2016-02-25 MED ORDER — CEFAZOLIN SODIUM-DEXTROSE 2-4 GM/100ML-% IV SOLN
2.0000 g | INTRAVENOUS | Status: AC
  Administered 2016-02-25: 2 g via INTRAVENOUS
  Filled 2016-02-25: qty 100

## 2016-02-25 MED ORDER — MUPIROCIN 2 % EX OINT
1.0000 "application " | TOPICAL_OINTMENT | Freq: Once | CUTANEOUS | Status: AC
  Administered 2016-02-25: 1 via TOPICAL

## 2016-02-25 MED ORDER — LIDOCAINE HCL (PF) 0.5 % IJ SOLN
INTRAMUSCULAR | Status: AC
  Filled 2016-02-25: qty 50

## 2016-02-25 MED ORDER — CHLORHEXIDINE GLUCONATE 4 % EX LIQD: 60.0000 mL | Freq: Once | CUTANEOUS | Status: DC

## 2016-02-25 MED ORDER — ONDANSETRON HCL 4 MG/2ML IJ SOLN: 4.0000 mg | Freq: Once | INTRAMUSCULAR | Status: DC | PRN

## 2016-02-25 MED ORDER — MIDAZOLAM HCL 2 MG/2ML IJ SOLN
1.0000 mg | INTRAMUSCULAR | Status: DC | PRN
  Administered 2016-02-25: 2 mg via INTRAVENOUS

## 2016-02-25 MED ORDER — PROPOFOL 500 MG/50ML IV EMUL
INTRAVENOUS | Status: DC | PRN
  Administered 2016-02-25: 75 ug/kg/min via INTRAVENOUS

## 2016-02-25 MED ORDER — LACTATED RINGERS IV SOLN
INTRAVENOUS | Status: DC
  Administered 2016-02-25: 09:00:00 via INTRAVENOUS

## 2016-02-25 MED ORDER — HYDROCODONE-ACETAMINOPHEN 5-325 MG PO TABS: 1.0000 | ORAL_TABLET | Freq: Four times a day (QID) | ORAL | Status: DC | PRN

## 2016-02-25 MED ORDER — MIDAZOLAM HCL 5 MG/5ML IJ SOLN
INTRAMUSCULAR | Status: DC | PRN
  Administered 2016-02-25: 2 mg via INTRAVENOUS

## 2016-02-25 MED ORDER — SODIUM CHLORIDE 0.9 % IR SOLN
Status: DC | PRN
  Administered 2016-02-25: 1000 mL

## 2016-02-25 MED ORDER — MUPIROCIN 2 % EX OINT
TOPICAL_OINTMENT | CUTANEOUS | Status: AC
  Filled 2016-02-25: qty 22

## 2016-02-25 MED ORDER — BUPIVACAINE HCL (PF) 0.5 % IJ SOLN
INTRAMUSCULAR | Status: DC | PRN
  Administered 2016-02-25: 10 mL

## 2016-02-25 MED ORDER — ONDANSETRON HCL 4 MG/2ML IJ SOLN
4.0000 mg | Freq: Once | INTRAMUSCULAR | Status: AC
  Administered 2016-02-25: 4 mg via INTRAVENOUS

## 2016-02-25 MED ORDER — LIDOCAINE HCL (PF) 0.5 % IJ SOLN
INTRAMUSCULAR | Status: DC | PRN
Start: 2016-02-25 — End: 2016-02-25
  Administered 2016-02-25: 50 mL

## 2016-02-25 MED ORDER — BUPIVACAINE HCL (PF) 0.5 % IJ SOLN
INTRAMUSCULAR | Status: AC
  Filled 2016-02-25: qty 30

## 2016-02-25 SURGICAL SUPPLY — 38 items
BAG HAMPER (MISCELLANEOUS) ×2 IMPLANT
BANDAGE ELASTIC 2 LF NS (GAUZE/BANDAGES/DRESSINGS) ×2 IMPLANT
BANDAGE ESMARK 4X12 BL STRL LF (DISPOSABLE) IMPLANT
BLADE SURG 15 STRL LF DISP TIS (BLADE) ×1 IMPLANT
BLADE SURG 15 STRL SS (BLADE) ×2
BNDG CMPR 12X4 ELC STRL LF (DISPOSABLE) ×1
BNDG CMPR MED 5X2 ELC HKLP NS (GAUZE/BANDAGES/DRESSINGS) ×1
BNDG CONFORM 2 STRL LF (GAUZE/BANDAGES/DRESSINGS) ×2 IMPLANT
BNDG ESMARK 4X12 BLUE STRL LF (DISPOSABLE) ×2
CHLORAPREP W/TINT 26ML (MISCELLANEOUS) ×2 IMPLANT
CLOTH BEACON ORANGE TIMEOUT ST (SAFETY) ×2 IMPLANT
COVER LIGHT HANDLE STERIS (MISCELLANEOUS) ×4 IMPLANT
CUFF TOURNIQUET SINGLE 18IN (TOURNIQUET CUFF) ×2 IMPLANT
DECANTER SPIKE VIAL GLASS SM (MISCELLANEOUS) ×2 IMPLANT
DRSG XEROFORM 1X8 (GAUZE/BANDAGES/DRESSINGS) ×2 IMPLANT
ELECT NEEDLE TIP 2.8 STRL (NEEDLE) ×2 IMPLANT
ELECT REM PT RETURN 9FT ADLT (ELECTROSURGICAL) ×2
ELECTRODE REM PT RTRN 9FT ADLT (ELECTROSURGICAL) ×1 IMPLANT
GLOVE BIOGEL PI IND STRL 7.0 (GLOVE) ×1 IMPLANT
GLOVE BIOGEL PI INDICATOR 7.0 (GLOVE) ×2
GLOVE SKINSENSE NS SZ8.0 LF (GLOVE) ×1
GLOVE SKINSENSE STRL SZ8.0 LF (GLOVE) ×1 IMPLANT
GLOVE SS N UNI LF 8.5 STRL (GLOVE) ×2 IMPLANT
GLOVE SURG SS PI 7.0 STRL IVOR (GLOVE) ×2 IMPLANT
GOWN STRL REUS W/ TWL LRG LVL3 (GOWN DISPOSABLE) ×1 IMPLANT
GOWN STRL REUS W/TWL LRG LVL3 (GOWN DISPOSABLE) ×6 IMPLANT
GOWN STRL REUS W/TWL XL LVL3 (GOWN DISPOSABLE) ×2 IMPLANT
HAND ALUMI XLG (SOFTGOODS) ×2 IMPLANT
KIT ROOM TURNOVER APOR (KITS) ×2 IMPLANT
MANIFOLD NEPTUNE II (INSTRUMENTS) ×2 IMPLANT
NEEDLE HYPO 21X1.5 SAFETY (NEEDLE) ×2 IMPLANT
NS IRRIG 1000ML POUR BTL (IV SOLUTION) ×2 IMPLANT
PACK BASIC LIMB (CUSTOM PROCEDURE TRAY) ×2 IMPLANT
PAD ARMBOARD 7.5X6 YLW CONV (MISCELLANEOUS) ×2 IMPLANT
SET BASIN LINEN APH (SET/KITS/TRAYS/PACK) ×2 IMPLANT
SPONGE GAUZE 2X2 8PLY STRL LF (GAUZE/BANDAGES/DRESSINGS) ×1 IMPLANT
SUT ETHILON 3 0 FSL (SUTURE) ×2 IMPLANT
SYR CONTROL 10ML LL (SYRINGE) ×1 IMPLANT

## 2016-02-25 NOTE — Anesthesia Procedure Notes (Addendum)
Procedure Name: MAC Date/Time: 02/25/2016 9:57 AM Performed by: Andree Elk, Roylee Chaffin A Pre-anesthesia Checklist: Patient identified, Timeout performed, Emergency Drugs available, Suction available and Patient being monitored Patient Re-evaluated:Patient Re-evaluated prior to inductionOxygen Delivery Method: Simple face mask   Anesthesia Regional Block:  Bier block (IV Regional)  Pre-Anesthetic Checklist: ,, timeout performed, Correct Patient, Correct Site, Correct Laterality, Correct Procedure,, site marked, surgical consent,, at surgeon's request  Laterality: Right     Needles:  Injection technique: Single-shot  Needle Type: Other      Needle Gauge: 22 and 22 G    Additional Needles: Bier block (IV Regional)  Nerve Stimulator or Paresthesia:   Additional Responses:  Pulse checked post tourniquet inflation. IV NSL discontinued post injection. Narrative:   Performed by: Personally

## 2016-02-25 NOTE — Op Note (Signed)
02/25/2016  10:38 AM  PATIENT:  Sarah [name]  55 y.o. female  PRE-OPERATIVE DIAGNOSIS:  Right Long Trigger Finger  POST-OPERATIVE DIAGNOSIS:  Right Long Trigger Finger  PROCEDURE:  Procedure(s): RIGHT LONG TRIGGER FINGER RELEASE (Right)  SURGEON:  Surgeon(s) and Role:    * Carole Civil, MD - Primary  PHYSICIAN ASSISTANT:   ASSISTANTS: none   ANESTHESIA:   regional  EBL:  Total I/O In: 300 [I.V.:300] Out: 3 [Blood:3]  BLOOD ADMINISTERED:none  DRAINS: none   LOCAL MEDICATIONS USED:  MARCAINE     SPECIMEN:  No Specimen  DISPOSITION OF SPECIMEN:  no  COUNTS:  YES  TOURNIQUET:   Total Tourniquet Time Documented: Upper Arm (Right) - 26 minutes Total: Upper Arm (Right) - 26 minutes   DICTATION: .Viviann Spare Dictation  PLAN OF CARE: Discharge to home after PACU  PATIENT DISPOSITION:  PACU - hemodynamically stable.   Delay start of Pharmacological VTE agent (>24hrs) due to surgical blood loss or risk of bleeding: not applicable  Details of surgery  Sarah [name] was identified in the preop area using 2 approved identification markers. Her chart was reviewed and updated and we confirmed and marked the right long finger as a surgical site.  She was taken to the operating room and given a Bier block  The right arm and hand were then prepped and draped sterilely.  Timeout was completed.  The incision was made longitudinally over the A1 pulley subcutaneous tissue was divided bluntly neurovascular structures were protected the proximal aspect of the A1 pulley was identified blunt instrument was passed beneath the A1 pulley and it was released. The finger was flexed to ensure adequate release and then the wound was irrigated and closed with 7 3-0 nylon sutures  Marcaine 10 mL was injected on each side of the wound 5 mL. The dressing was then applied.  Pressure was placed on the dressing while the tourniquet was released and good color is noted in each digit  The  patient was taken to the recovery room in stable condition

## 2016-02-25 NOTE — Anesthesia Preprocedure Evaluation (Signed)
Anesthesia Evaluation  Patient identified by MRN, date of birth, ID band Patient awake    Reviewed: Allergy & Precautions, NPO status , Patient's Chart, lab work & pertinent test results  Airway Mallampati: II  TM Distance: >3 FB     Dental  (+) Teeth Intact   Pulmonary former smoker,    breath sounds clear to auscultation       Cardiovascular negative cardio ROS   Rhythm:Regular Rate:Normal     Neuro/Psych Anxiety    GI/Hepatic GERD  Medicated and Controlled,  Endo/Other  diabetes, Type 2, Oral Hypoglycemic Agents  Renal/GU      Musculoskeletal   Abdominal   Peds  Hematology   Anesthesia Other Findings   Reproductive/Obstetrics                             Anesthesia Physical Anesthesia Plan  ASA: II  Anesthesia Plan: Bier Block   Post-op Pain Management:    Induction: Intravenous  Airway Management Planned: Simple Face Mask  Additional Equipment:   Intra-op Plan:   Post-operative Plan:   Informed Consent: I have reviewed the patients History and Physical, chart, labs and discussed the procedure including the risks, benefits and alternatives for the proposed anesthesia with the patient or authorized representative who has indicated his/her understanding and acceptance.     Plan Discussed with:   Anesthesia Plan Comments:         Anesthesia Quick Evaluation

## 2016-02-25 NOTE — Brief Op Note (Addendum)
02/25/2016  10:38 AM  PATIENT:  Sarah [name]  55 y.o. female  PRE-OPERATIVE DIAGNOSIS:  Right Long Trigger Finger  POST-OPERATIVE DIAGNOSIS:  Right Long Trigger Finger  PROCEDURE:  Procedure(s): RIGHT LONG TRIGGER FINGER RELEASE (Right)  SURGEON:  Surgeon(s) and Role:    * Carole Civil, MD - Primary  PHYSICIAN ASSISTANT:   ASSISTANTS: none   ANESTHESIA:   regional  EBL:  Total I/O In: 300 [I.V.:300] Out: 3 [Blood:3]  BLOOD ADMINISTERED:none  DRAINS: none   LOCAL MEDICATIONS USED:  MARCAINE     SPECIMEN:  No Specimen  DISPOSITION OF SPECIMEN:  no  COUNTS:  YES  TOURNIQUET:   Total Tourniquet Time Documented: Upper Arm (Right) - 26 minutes Total: Upper Arm (Right) - 26 minutes   DICTATION: .Viviann Spare Dictation  PLAN OF CARE: Discharge to home after PACU  PATIENT DISPOSITION:  PACU - hemodynamically stable.   Delay start of Pharmacological VTE agent (>24hrs) due to surgical blood loss or risk of bleeding: not applicable  Details of surgery  Sarah [name] was identified in the preop area using 2 approved identification markers. Her chart was reviewed and updated and we confirmed and marked the right long finger as a surgical site.  She was taken to the operating room and given a Bier block  The right arm and hand were then prepped and draped sterilely.  Timeout was completed.  The incision was made longitudinally over the A1 pulley subcutaneous tissue was divided bluntly neurovascular structures were protected the proximal aspect of the A1 pulley was identified blunt instrument was passed beneath the A1 pulley and it was released. The finger was flexed to ensure adequate release and then the wound was irrigated and closed with 7 3-0 nylon sutures  Marcaine 10 mL was injected on each side of the wound 5 mL. The dressing was then applied.  Pressure was placed on the dressing while the tourniquet was released and good color is noted in each digit  The  patient was taken to the recovery room in stable condition

## 2016-02-25 NOTE — Progress Notes (Signed)
Time chaged

## 2016-02-25 NOTE — H&P (Signed)
  Patient ID: Sarah [name], female   DOB: [birth year], 55 y.o.   MRN: [id number]    Chief Complaint   Patient presents with   .  Referral       for surgery from [place], right long trigger finger     HPI pain over the A1 pulley of the right long finger. Patient requests surgical intervention unresponsive to her medication and nonoperative measures. Seems to be bothering her a lot catching locking in the flexed position. Timing constant Duration several months   Review of Systems  Constitutional: Negative for fever and chills.  Neurological: Negative for tingling and sensory change.      Past Medical History   Diagnosis  Date   .  Diabetes mellitus     .  Diverticulosis     .  Hypercholesteremia     .  Anxiety     .  GERD (gastroesophageal reflux disease)       BP 126/76 mmHg  Pulse 84  Ht 5' 3.5" (1.613 m)  Wt 182 lb (82.555 kg)  BMI 31.73 kg/m2  Physical Exam Physical Exam  Constitutional: The patient appears well-developed and well-nourished. No distress.  The patient is oriented to person, place, and time.   Psychiatric: The patient has a normal mood and affect.   Cardiovascular: Intact distal pulses.   Neurological: sensation is normal   Skin: Skin is warm and dry. No rash noted. The patient is not diaphoretic. No erythema. No pallor.    Ortho Exam  Tenderness noted over the A1 pulley of the right long finger, normal flexion of the flexor tendon normal strength the flexor tendon normal stability in all joints of the digits and intact good color and pulse of the radius color is normal in the hand sensation is normal epitrochlear lymph nodes are negative  ASSESSMENT AND PLAN   Right long finger trigger phenomenon

## 2016-02-25 NOTE — Discharge Instructions (Signed)
Remove dressing in 7 days  Until then keep the dressing dry  Please bend and straighten your operative finger to prevent any adhesions  Once the dressing is removed apply a small amount of Neosporin and a Band-Aid daily  After 7 days you can get the incision wet but only a shower no submersion of hand in water. Wear a glove when taking the shower.

## 2016-02-25 NOTE — Interval H&P Note (Signed)
History and Physical Interval Note:  02/25/2016 9:55 AM  Sarah [name]  has presented today for surgery, with the diagnosis of RIGHT LONG TRIGGER FINGER  The various methods of treatment have been discussed with the patient and family. After consideration of risks, benefits and other options for treatment, the patient has consented to  Procedure(s): RIGHT LONG TRIGGER FINGER RELEASE (Right) as a surgical intervention .  The patient's history has been reviewed, patient examined, no change in status, stable for surgery.  I have reviewed the patient's chart and labs.  Questions were answered to the patient's satisfaction.     Arther Abbott

## 2016-02-25 NOTE — Anesthesia Postprocedure Evaluation (Signed)
Anesthesia Post Note  Patient: Sarah [name]  Procedure(s) Performed: Procedure(s) (LRB): RIGHT LONG TRIGGER FINGER RELEASE (Right)  Patient location during evaluation: PACU Anesthesia Type: Bier Block Level of consciousness: awake and alert and oriented Pain management: pain level controlled Vital Signs Assessment: post-procedure vital signs reviewed and stable Respiratory status: spontaneous breathing and patient connected to face mask oxygen Cardiovascular status: stable Postop Assessment: no signs of nausea or vomiting Anesthetic complications: no    Last Vitals:  Filed Vitals:   02/25/16 0950 02/25/16 1045  BP:  120/75  Pulse:  70  Temp:    Resp: 17 17    Last Pain:  Filed Vitals:   02/25/16 1045  PainSc: 3                  Chaston Bradburn A

## 2016-02-25 NOTE — Transfer of Care (Signed)
Immediate Anesthesia Transfer of Care Note  Patient: Sarah [name]  Procedure(s) Performed: Procedure(s): RIGHT LONG TRIGGER FINGER RELEASE (Right)  Patient Location: PACU  Anesthesia Type:Bier block  Level of Consciousness: awake, alert , oriented and patient cooperative  Airway & Oxygen Therapy: Patient Spontanous Breathing and Patient connected to face mask oxygen  Post-op Assessment: Report given to RN and Post -op Vital signs reviewed and stable  Post vital signs: Reviewed and stable  Last Vitals:  Filed Vitals:   02/25/16 0945 02/25/16 0950  BP: 99/60   Pulse:    Temp:    Resp: 18 17    Last Pain:  Filed Vitals:   02/25/16 0953  PainSc: 3       Patients Stated Pain Goal: 8 ([id number])  Complications: No apparent anesthesia complications

## 2016-03-03 ENCOUNTER — Encounter (HOSPITAL_COMMUNITY): Payer: Self-pay | Admitting: Orthopedic Surgery

## 2016-03-10 ENCOUNTER — Ambulatory Visit (INDEPENDENT_AMBULATORY_CARE_PROVIDER_SITE_OTHER): Payer: BC Managed Care – PPO | Admitting: Orthopedic Surgery

## 2016-03-10 VITALS — BP 109/68 | HR 90 | Ht 63.5 in | Wt 179.0 lb

## 2016-03-10 DIAGNOSIS — M79671 Pain in right foot: Secondary | ICD-10-CM

## 2016-03-10 DIAGNOSIS — Z4789 Encounter for other orthopedic aftercare: Secondary | ICD-10-CM

## 2016-03-10 NOTE — Patient Instructions (Signed)
Wear splint at night  Continue exercises  Exercise hand open close the hand making a full fist  Keep wound clean and dry. Okay to take a shower. Do not immerse in water for one week

## 2016-03-10 NOTE — Progress Notes (Signed)
Chief Complaint  Patient presents with  . Routine Post Op    Right long trigger finger DOS 02/25/16    The right long finger triggers 2 weeks postop sutures will be removed we will apply some skin CL  She also has a plantar fascial problem followed by Dr. Luna Glasgow she's done exercises does not want an injection. We advised to try a night splint  I will see her in 4 weeks to check on the flexion extension of the finger.

## 2016-04-07 ENCOUNTER — Ambulatory Visit (INDEPENDENT_AMBULATORY_CARE_PROVIDER_SITE_OTHER): Payer: BC Managed Care – PPO | Admitting: Orthopedic Surgery

## 2016-04-07 DIAGNOSIS — Z9889 Other specified postprocedural states: Secondary | ICD-10-CM

## 2016-04-07 DIAGNOSIS — Z4789 Encounter for other orthopedic aftercare: Secondary | ICD-10-CM

## 2016-04-07 NOTE — Progress Notes (Signed)
Status post trigger finger release on July 6 complains of lack of extension now. Pain at the PIP joint of the long finger. Her wound looks clean she has no catching or locking she stiff on extension and some tenderness and pain when we flex the PIP joint  Recommend OT follow-up 3 weeks

## 2016-04-15 ENCOUNTER — Ambulatory Visit (HOSPITAL_COMMUNITY): Payer: BC Managed Care – PPO | Attending: Orthopedic Surgery | Admitting: Occupational Therapy

## 2016-04-15 ENCOUNTER — Encounter (HOSPITAL_COMMUNITY): Payer: Self-pay | Admitting: Occupational Therapy

## 2016-04-15 DIAGNOSIS — M79644 Pain in right finger(s): Secondary | ICD-10-CM | POA: Diagnosis not present

## 2016-04-15 DIAGNOSIS — R29898 Other symptoms and signs involving the musculoskeletal system: Secondary | ICD-10-CM | POA: Insufficient documentation

## 2016-04-15 NOTE — Therapy (Signed)
[place], [place], [address] Phone: [phone]   Fax:  [phone]  Occupational Therapy Evaluation  Patient Details  Name: Sarah [name] MRN: [id number] Date of Birth: [birth year] Referring Provider: Dr. Arther Abbott  Encounter Date: 04/15/2016      OT End of Session - 04/15/16 1424    Visit Number 1   Number of Visits 6   Date for OT Re-Evaluation 05/20/16   Authorization Type BCBS   OT Start Time 1300   OT Stop Time 1336   OT Time Calculation (min) 36 min   Activity Tolerance Patient tolerated treatment well   Behavior During Therapy [facility] for tasks assessed/performed      Past Medical History:  Diagnosis Date  . Anxiety   . Diabetes mellitus   . Diverticulosis   . GERD (gastroesophageal reflux disease)   . Hypercholesteremia     Past Surgical History:  Procedure Laterality Date  . ABDOMINAL HYSTERECTOMY     complete  . CARDIAC CATHETERIZATION  2011  . COLONOSCOPY  01/04/2010   left sided transverse diverticula/two diminutive rectal polyps  . COLONOSCOPY  03/28/2012   Procedure: COLONOSCOPY;  Surgeon: Daneil Dolin, MD;  Location: [facility];  Service: Endoscopy;  Laterality: N/A;  9:30  . FOOT SURGERY     right-tendon repair  . laparoscopic sigmoid colectomy  02/01/10   Dr Jola Babinski  . TRIGGER FINGER RELEASE Left 03/2015  . TRIGGER FINGER RELEASE Right 02/25/2016   Procedure: RIGHT LONG TRIGGER FINGER RELEASE;  Surgeon: Carole Civil, MD;  Location: [facility];  Service: Orthopedics;  Laterality: Right;    There were no vitals filed for this visit.      Subjective Assessment - 04/15/16 1337    Subjective  S: This middle knuckle gets so sore.    Pertinent History Pt is a 55 y/o female s/p right long finger trigger finger release on 02/25/16. Pt reports pain and soreness along incision scar, as well as increased soreness along PIP joint of right long finger. Dr. Arther Abbott referred pt to occupational therapy for evaluation and treatment.    Patient Stated Goals To be able to use my right hand like normal.    Currently in Pain? No/denies           [facility] OT Assessment - 04/15/16 1257      Assessment   Diagnosis Right long finger trigger finger release   Referring Provider Dr. Arther Abbott   Onset Date 02/25/16  date of surgery   Prior Therapy None     Precautions   Precautions None     Restrictions   Weight Bearing Restrictions No     Balance Screen   Has the patient fallen in the past 6 months No   Has the patient had a decrease in activity level because of a fear of falling?  No   Is the patient reluctant to leave their home because of a fear of falling?  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    Leisure granddaughter     ADL   ADL comments Pt is having difficulty with grasping and lifting weighted objects, occasional difficulty with manipulating objects     Written Expression  Dominant Hand Right     Cognition   Overall Cognitive Status Within Functional Limits for tasks assessed     ROM / Strength   AROM / PROM / Strength Strength     Palpation   Palpation comment Max fascial restrictions and scar tissue palpated at incision site     Strength   Strength Assessment Site Hand   Right/Left hand Right;Left   Right Hand Gross Grasp Functional   Right Hand Grip (lbs) 10   Right Hand Lateral Pinch 13 lbs   Right Hand 3 Point Pinch 5 lbs   Left Hand Gross Grasp Functional   Left Hand Grip (lbs) 57   Left Hand Lateral Pinch 15 lbs   Left Hand 3 Point Pinch 15 lbs     Right Hand AROM   R Long  MCP 0-90 90 Degrees   R Long PIP 0-100 95 Degrees   R Long DIP 0-70 55 Degrees     Right Hand PROM   R Long   MCP 0-90 90 Degrees   R Long PIP 0-100 100 Degrees   R Long DIP 0-70 70 Degrees                         OT Education - 04/15/16 1316    Education provided Yes   Education Details tendon glides   Person(s) Educated Patient   Methods Explanation;Demonstration;Handout   Comprehension Verbalized understanding;Returned demonstration          OT Short Term Goals - 04/15/16 1429      OT SHORT TERM GOAL #1   Title Pt will be provided with and educated on HEP.    Time 3   Period Weeks   Status New     OT SHORT TERM GOAL #2   Title Pt will decrease pain to 5/10 or less in right hand/long finger to improve ability to complete ADL tasks.    Time 3   Period Weeks   Status New     OT SHORT TERM GOAL #3   Title Pt will decrease fascial restrictions to min amounts around scar on volar surface of hand to improve functional use of right hand.    Time 3   Period Weeks   Status New     OT SHORT TERM GOAL #4   Title Pt will increase right grip strength by 10# or greater to improve ability to grasp skillet.    Time 3   Period Weeks   Status New     OT SHORT TERM GOAL #5   Title Pt will increase right pinch strength by 3# or greater to improve ability to grasp small objects with digits.    Time 3   Period Weeks   Status New                  Plan - 04/15/16 1425    Clinical Impression Statement A: Pt is a 55 y/o female s/p right long finger trigger finger release, presenting with increased pain, increased fascial restrictions, decreased grip and pinch strength in right hand, limiting use of RUE as dominant during daily tasks. Pt also reports increased stiffness of right hand and long finger MCP/PIP/DIP joints in the mornings. Pt is using ice for edema management. Provided tendon gliding exercises for HEP.    Rehab Potential Good   OT Frequency 2x / week   OT Duration --  3 weeks   OT Treatment/Interventions Self-care/ADL training;Therapeutic  exercise;Patient/family education;Manual Therapy;Cryotherapy;Parrafin;Therapeutic activities;Moist Heat;Passive range of motion   Plan P: Pt will benefit from skilled OT services to decrease pain and fascial restrictions, increase grip and pinch strength, and improve functional use of RUE as dominant. Treatment plan: Myofascial release, scar massage, manual therapy, P/ROM, A/ROM, grip and pinch strengthening, modalities as needed.    OT Home Exercise Plan tendon glides   Consulted and Agree with Plan of Care Patient      Patient will benefit from skilled therapeutic intervention in order to improve the following deficits and impairments:  Decreased strength, Decreased range of motion, Pain, Impaired UE functional use, Increased fascial restricitons, Decreased scar mobility  Visit Diagnosis: Pain in right finger(s)  Other symptoms and signs involving the musculoskeletal system    Problem List Patient Active Problem List   Diagnosis Date Noted  . Trigger middle finger   . Diabetes mellitus type 2 in obese ([place]) 08/19/2015  . 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 Visit for routine gyn exam 08/19/2015  . Type 2 diabetes mellitus ([place]) 06/24/2013  . Rectal bleed 03/23/2012  . HYPERLIPIDEMIA 11/30/2009  . ANXIETY 11/30/2009  . CONSTIPATION 11/30/2009   Guadelupe Sabin, OTR/L  [phone] 04/15/2016, 2:35 PM  [place] [address] [place], [place], [address] Phone: [phone]   Fax:  [phone]  Name: Sarah [name] MRN: [id number] Date of Birth: 08-07-1961

## 2016-04-15 NOTE — Patient Instructions (Signed)
Tendon Gliding Exercises: Complete 10X, hold for 3-5 seconds, 1-2x per day  1) Straight: begin with wrist in extended position and fingers straight      2) Hook: Bend your fingers making them look like a hook while keeping your thumb straight.      3) Fist: Make your hand into a fist.      4) Table Top: Straighten your fingers straight out making them look like a table top.      5) Straight Fist: Bend your fingers straight down into a straight fist.    

## 2016-05-02 ENCOUNTER — Ambulatory Visit (HOSPITAL_COMMUNITY): Payer: BC Managed Care – PPO | Attending: Orthopedic Surgery

## 2016-05-02 DIAGNOSIS — M79644 Pain in right finger(s): Secondary | ICD-10-CM | POA: Diagnosis present

## 2016-05-02 DIAGNOSIS — R29898 Other symptoms and signs involving the musculoskeletal system: Secondary | ICD-10-CM | POA: Insufficient documentation

## 2016-05-02 NOTE — Therapy (Signed)
[place], [place], [address] Phone: [phone]   Fax:  [phone]  Occupational Therapy Treatment  Patient Details  Name: Sarah [name] MRN: [id number] Date of Birth: 1961-06-21 Referring Provider: Dr. Arther Abbott  Encounter Date: 05/02/2016      OT End of Session - 05/02/16 1721    Visit Number 2   Number of Visits 6   Date for OT Re-Evaluation 05/20/16   Authorization Type BCBS   OT Start Time 1630   OT Stop Time 1715   OT Time Calculation (min) 45 min   Activity Tolerance Patient tolerated treatment well   Behavior During Therapy [facility] for tasks assessed/performed      Past Medical History:  Diagnosis Date  . Anxiety   . Diabetes mellitus   . Diverticulosis   . GERD (gastroesophageal reflux disease)   . Hypercholesteremia     Past Surgical History:  Procedure Laterality Date  . ABDOMINAL HYSTERECTOMY     complete  . CARDIAC CATHETERIZATION  2011  . COLONOSCOPY  01/04/2010   left sided transverse diverticula/two diminutive rectal polyps  . COLONOSCOPY  03/28/2012   Procedure: COLONOSCOPY;  Surgeon: Daneil Dolin, MD;  Location[facility];  Service: Endoscopy;  Laterality: N/A;  9:30  . FOOT SURGERY     right-tendon repair  . laparoscopic sigmoid colectomy  02/01/10   Dr Jola Babinski  . TRIGGER FINGER RELEASE Left 03/2015  . TRIGGER FINGER RELEASE Right 02/25/2016   Procedure: RIGHT LONG TRIGGER FINGER RELEASE;  Surgeon: Carole Civil, MD;  Location: [facility];  Service: Orthopedics;  Laterality: Right;    There were no vitals filed for this visit.      Subjective Assessment - 05/02/16 1632    Subjective  S: It just feels tight and that's what really bothers me.    Currently in Pain? Yes   Pain Score 1    Pain Location Finger (Comment which one)   Pain Orientation Right   Pain Descriptors / Indicators Nagging;Tightness   Pain Type Acute pain   Pain Radiating Towards  N/A   Pain Onset In the past 7 days   Pain Frequency Constant   Aggravating Factors  N/A   Pain Relieving Factors stretches, rest   Effect of Pain on Daily Activities moderate effect   Multiple Pain Sites No            [facility] OT Assessment - 05/02/16 1647      Assessment   Diagnosis Right long finger trigger finger release     Precautions   Precautions None     Palpation   Palpation comment Min fascial restrictions and scar tissue near incision site.     Strength   Strength Assessment Site Hand   Right/Left hand Right;Left   Right Hand Gross Grasp Functional   Right Hand Grip (lbs) 65   Right Hand Lateral Pinch 20 lbs   Right Hand 3 Point Pinch 14 lbs   Left Hand Gross Grasp Functional   Left Hand Grip (lbs) 70                  OT Treatments/Exercises (OP) - 05/02/16 1654      Exercises   Exercises Hand     Hand Exercises   Theraputty Flatten;Roll;Grip   Theraputty - Flatten red   Theraputty - Roll red   Theraputty - Grip red   Hand Gripper with Large Beads all beads with gripper  set at 55#   Hand Gripper with Medium Beads all beads with gripper set at 55#   Hand Gripper with Small Beads all beads with gripper set at 55#   Sponges 12, 11  high resistance   Other Hand Exercises Pt utilized green resistive clothespin to pick up 30 sponges. Pt used 3 point pinch using thumb, middle, and ring finger.      Manual Therapy   Manual Therapy Myofascial release   Manual therapy comments Manual therapy completed prior to exercises   Myofascial Release Myofascial release and scar mobilization completed to right hand at incision site to decrease fascial restrictions and increase joint mobility and comfort.                OT Education - 05/02/16 1725    Education provided Yes   Education Details Pt was given red theraputty. Education given on grip and pinch exercises.    Person(s) Educated Patient   Methods Explanation;Demonstration;Verbal cues    Comprehension Verbalized understanding;Returned demonstration          OT Short Term Goals - 05/02/16 1728      OT SHORT TERM GOAL #1   Title Pt will be provided with and educated on HEP.    Time 3   Period Weeks   Status On-going     OT SHORT TERM GOAL #2   Title Pt will decrease pain to 5/10 or less in right hand/long finger to improve ability to complete ADL tasks.    Time 3   Period Weeks   Status On-going     OT SHORT TERM GOAL #3   Title Pt will decrease fascial restrictions to min amounts around scar on volar surface of hand to improve functional use of right hand.    Time 3   Period Weeks   Status On-going     OT SHORT TERM GOAL #4   Title Pt will increase right grip strength by 10# or greater to improve ability to grasp skillet.    Time 3   Period Weeks   Status On-going     OT SHORT TERM GOAL #5   Title Pt will increase right pinch strength by 3# or greater to improve ability to grasp small objects with digits.    Time 3   Period Weeks   Status On-going                  Plan - 05/02/16 1723    Clinical Impression Statement A: Initiated myofascial release/scar mobilization and completed pinch and grip strengthening. Reassessed patient's grip and pinch which have greatly improved. Educated patient on completing scar tissue massage and using theraputty for pinch and grip strength. Pt reports that she has been completing HEP every day.    Plan P: Measure for MD appointment. Continue with grip and pinch strengthening.       Patient will benefit from skilled therapeutic intervention in order to improve the following deficits and impairments:  Decreased strength, Decreased range of motion, Pain, Impaired UE functional use, Increased fascial restricitons, Decreased scar mobility  Visit Diagnosis: Other symptoms and signs involving the musculoskeletal system  Pain in right finger(s)    Problem List Patient Active Problem List   Diagnosis Date Noted   . Trigger middle finger   . Diabetes mellitus type 2 in obese ([place]) 08/19/2015  . 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 Visit for routine gyn exam 08/19/2015  . Type 2 diabetes mellitus ([place])  06/24/2013  . Rectal bleed 03/23/2012  . HYPERLIPIDEMIA 11/30/2009  . ANXIETY 11/30/2009  . CONSTIPATION 11/30/2009   Sarah [name], OTR/L,CBIS  [phone]  05/02/2016, 5:28 PM  [facility] [address] [place], [place], [address] Phone: [phone]   Fax:  [phone]  Name: Sarah [name] MRN: [id number] Date of Birth: 1961-04-13

## 2016-05-05 ENCOUNTER — Ambulatory Visit (HOSPITAL_COMMUNITY): Payer: BC Managed Care – PPO

## 2016-05-05 ENCOUNTER — Encounter (HOSPITAL_COMMUNITY): Payer: Self-pay

## 2016-05-05 DIAGNOSIS — R29898 Other symptoms and signs involving the musculoskeletal system: Secondary | ICD-10-CM

## 2016-05-05 NOTE — Therapy (Signed)
[place], [place], [address] Phone: [phone]   Fax:  [phone]  Occupational Therapy Treatment And reassessment Patient Details  Name: Sarah [name] MRN: [id number] Date of Birth: 10-30-1960 Referring Provider: Dr. Arther Abbott  Encounter Date: 05/05/2016      OT End of Session - 05/05/16 1743    Visit Number 3   Number of Visits 6   Authorization Type BC[employer]   OT Start Time 1650  reassessment/discharge   OT Stop Time 1720   OT Time Calculation (min) 30 min   Activity Tolerance Patient tolerated treatment well   Behavior During Therapy [facility] for tasks assessed/performed      Past Medical History:  Diagnosis Date  . Anxiety   . Diabetes mellitus   . Diverticulosis   . GERD (gastroesophageal reflux disease)   . Hypercholesteremia     Past Surgical History:  Procedure Laterality Date  . ABDOMINAL HYSTERECTOMY     complete  . CARDIAC CATHETERIZATION  2011  . COLONOSCOPY  01/04/2010   left sided transverse diverticula/two diminutive rectal polyps  . COLONOSCOPY  03/28/2012   Procedure: COLONOSCOPY;  Surgeon: Daneil Dolin, MD;  Location: [facility];  Service: Endoscopy;  Laterality: N/A;  9:30  . FOOT SURGERY     right-tendon repair  . laparoscopic sigmoid colectomy  02/01/10   Dr Jola Babinski  . TRIGGER FINGER RELEASE Left 03/2015  . TRIGGER FINGER RELEASE Right 02/25/2016   Procedure: RIGHT LONG TRIGGER FINGER RELEASE;  Surgeon: Carole Civil, MD;  Location: [facility];  Service: Orthopedics;  Laterality: Right;    There were no vitals filed for this visit.      Subjective Assessment - 05/05/16 1713    Subjective  S: The only thing I notice is the tightness.     Currently in Pain? Yes   Pain Score 5    Pain Location Finger (Comment which one)  3rd long finger   Pain Orientation Right   Pain Descriptors / Indicators Tightness   Pain Type Acute pain            [facility]  OT Assessment - 05/05/16 1653      Assessment   Diagnosis Right long finger trigger finger release   Onset Date 02/25/16     Precautions   Precautions None     Prior Function   Level of Independence Independent with basic ADLs     Strength   Strength Assessment Site Hand   Right/Left hand Right   Right Hand Grip (lbs) 75   Right Hand Lateral Pinch 20 lbs   Right Hand 3 Point Pinch 16 lbs     Right Hand AROM   R Long  MCP 0-90 90 Degrees  previous: same   R Long PIP 0-100 95 Degrees  previous: same   R Long DIP 0-70 74 Degrees  previous: 17                  OT Treatments/Exercises (OP) - 05/05/16 1715      Exercises   Exercises Hand     Modalities   Modalities Moist Heat;Paraffin     Moist Heat Therapy   Number Minutes Moist Heat 10 Minutes   Moist Heat Location Hand     RUE Paraffin   RUE Paraffin Location Hand     Manual Therapy   Manual Therapy Myofascial release   Manual therapy comments Manual therapy completed prior to exercises  Myofascial Release Myofascial release and scar mobilization completed to right hand at incision site to decrease fascial restrictions and increase joint mobility and comfort.                OT Education - 05/05/16 1739    Education provided Yes   Education Details Pt was given continued education on completing self myofascial release to decrease scar tissue and tightness. Patient given information on where to purchase paraffin bath for use at home.    Person(s) Educated Patient   Methods Explanation;Handout   Comprehension Verbalized understanding          OT Short Term Goals - 05/05/16 1702      OT SHORT TERM GOAL #1   Title Pt will be provided with and educated on HEP.    Time 3   Period Weeks   Status Achieved     OT SHORT TERM GOAL #2   Title Pt will decrease pain to 5/10 or less in right hand/long finger to improve ability to complete ADL tasks.    Time 3   Period Weeks   Status Achieved      OT SHORT TERM GOAL #3   Title Pt will decrease fascial restrictions to min amounts around scar on volar surface of hand to improve functional use of right hand.    Time 3   Period Weeks   Status Achieved     OT SHORT TERM GOAL #4   Title Pt will increase right grip strength by 10# or greater to improve ability to grasp skillet.    Time 3   Period Weeks   Status Achieved     OT SHORT TERM GOAL #5   Title Pt will increase right pinch strength by 3# or greater to improve ability to grasp small objects with digits.    Time 3   Period Weeks   Status Achieved                  Plan - 05/05/16 1744    Clinical Impression Statement A: Reassessment completed today as patient sees MD next week for follow up. Patient has met all therapy goals. No pain or weakness reported. patient only reports feeling of tightness in long finger (4th digit). Patient has been educated on self myofascial release and feels comfortable with completing independently at home.. Pt in agreement with discharge.    Plan P: Discharge from therapy with HEP.      Patient will benefit from skilled therapeutic intervention in order to improve the following deficits and impairments:  Decreased strength, Decreased range of motion, Pain, Impaired UE functional use, Increased fascial restricitons, Decreased scar mobility  Visit Diagnosis: Other symptoms and signs involving the musculoskeletal system    Problem List Patient Active Problem List   Diagnosis Date Noted  . Trigger middle finger   . Diabetes mellitus type 2 in obese ([place]) 08/19/2015  . 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 Visit for routine gyn exam 08/19/2015  . Type 2 diabetes mellitus ([place]) 06/24/2013  . Rectal bleed 03/23/2012  . HYPERLIPIDEMIA 11/30/2009  . ANXIETY 11/30/2009  . CONSTIPATION 11/30/2009     OCCUPATIONAL THERAPY DISCHARGE SUMMARY  Visits from Start of Care: 3  Current functional level related to  goals / functional outcomes: See above   Remaining deficits: See above   Education / Equipment: See above Plan: Patient agrees to discharge.  Patient goals were met. Patient is being discharged due to meeting the  stated rehab goals.  ?????        Ailene Ravel, OTR/L,CBIS  [phone]  05/05/2016, 5:47 PM  [facility] [place] [address] [place], [place], [address] Phone: [phone]   Fax:  [phone]  Name: Sarah [name] MRN: [id number] Date of Birth: [birth year]

## 2016-05-09 ENCOUNTER — Encounter: Payer: Self-pay | Admitting: Orthopedic Surgery

## 2016-05-09 ENCOUNTER — Ambulatory Visit (INDEPENDENT_AMBULATORY_CARE_PROVIDER_SITE_OTHER): Payer: BC Managed Care – PPO | Admitting: Orthopedic Surgery

## 2016-05-09 VITALS — BP 112/69 | HR 80 | Ht 63.0 in | Wt 180.0 lb

## 2016-05-09 DIAGNOSIS — Z4789 Encounter for other orthopedic aftercare: Secondary | ICD-10-CM

## 2016-05-09 DIAGNOSIS — Z9889 Other specified postprocedural states: Secondary | ICD-10-CM

## 2016-05-09 NOTE — Progress Notes (Signed)
Postop visit Chief Complaint  Patient presents with  . Follow-up    post op trigger finger release, rt long, 02/25/16    BP 112/69   Pulse 80   Ht 5\' 3"  (1.6 m)   Wt 180 lb (81.6 kg)   BMI 31.89 kg/m   Postop trigger finger release of the right long finger  Status post physical therapy first stiffness  Stiffness has resolved full flexion-extension no catching patient released

## 2016-05-10 ENCOUNTER — Encounter (HOSPITAL_COMMUNITY): Payer: BC Managed Care – PPO

## 2016-05-12 ENCOUNTER — Encounter (HOSPITAL_COMMUNITY): Payer: BC Managed Care – PPO

## 2016-05-17 ENCOUNTER — Encounter (HOSPITAL_COMMUNITY): Payer: BC Managed Care – PPO

## 2016-07-07 ENCOUNTER — Emergency Department (HOSPITAL_COMMUNITY)
Admission: EM | Admit: 2016-07-07 | Discharge: 2016-07-07 | Disposition: A | Discharge status: Home / Self Care | Payer: BC Managed Care – PPO | Attending: Dermatology | Admitting: Dermatology

## 2016-07-07 ENCOUNTER — Encounter (HOSPITAL_COMMUNITY): Payer: Self-pay | Admitting: Emergency Medicine

## 2016-07-07 DIAGNOSIS — Z791 Long term (current) use of non-steroidal anti-inflammatories (NSAID): Secondary | ICD-10-CM | POA: Insufficient documentation

## 2016-07-07 DIAGNOSIS — Z5321 Procedure and treatment not carried out due to patient leaving prior to being seen by health care provider: Secondary | ICD-10-CM | POA: Insufficient documentation

## 2016-07-07 DIAGNOSIS — E119 Type 2 diabetes mellitus without complications: Secondary | ICD-10-CM | POA: Insufficient documentation

## 2016-07-07 DIAGNOSIS — Z87891 Personal history of nicotine dependence: Secondary | ICD-10-CM | POA: Diagnosis not present

## 2016-07-07 DIAGNOSIS — H578 Other specified disorders of eye and adnexa: Secondary | ICD-10-CM | POA: Diagnosis not present

## 2016-07-07 DIAGNOSIS — Z79899 Other long term (current) drug therapy: Secondary | ICD-10-CM | POA: Insufficient documentation

## 2016-07-07 DIAGNOSIS — Z794 Long term (current) use of insulin: Secondary | ICD-10-CM | POA: Insufficient documentation

## 2016-07-07 NOTE — ED Notes (Signed)
Patient states "my doctor's office just called me and I've been trying to get in with them about my insulin so they told me to come on over and they will look at my eyes also." Patient left AMA.

## 2016-07-07 NOTE — ED Triage Notes (Signed)
Pt reports waking with eye irritation and matting to left eye.  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

## 2016-10-22 ENCOUNTER — Emergency Department (HOSPITAL_COMMUNITY)
Admission: EM | Admit: 2016-10-22 | Discharge: 2016-10-22 | Disposition: A | Discharge status: Home / Self Care | Payer: BC Managed Care – PPO | Attending: Emergency Medicine | Admitting: Emergency Medicine

## 2016-10-22 ENCOUNTER — Encounter (HOSPITAL_COMMUNITY): Payer: Self-pay | Admitting: *Deleted

## 2016-10-22 DIAGNOSIS — J069 Acute upper respiratory infection, unspecified: Secondary | ICD-10-CM

## 2016-10-22 DIAGNOSIS — Z7984 Long term (current) use of oral hypoglycemic drugs: Secondary | ICD-10-CM | POA: Diagnosis not present

## 2016-10-22 DIAGNOSIS — B9789 Other viral agents as the cause of diseases classified elsewhere: Secondary | ICD-10-CM

## 2016-10-22 DIAGNOSIS — Z87891 Personal history of nicotine dependence: Secondary | ICD-10-CM | POA: Insufficient documentation

## 2016-10-22 DIAGNOSIS — Z79899 Other long term (current) drug therapy: Secondary | ICD-10-CM | POA: Diagnosis not present

## 2016-10-22 DIAGNOSIS — E119 Type 2 diabetes mellitus without complications: Secondary | ICD-10-CM | POA: Diagnosis not present

## 2016-10-22 DIAGNOSIS — R05 Cough: Secondary | ICD-10-CM | POA: Diagnosis present

## 2016-10-22 MED ORDER — HYDROCOD POLST-CPM POLST ER 10-8 MG/5ML PO SUER: 5.0000 mL | Freq: Two times a day (BID) | ORAL | 0 refills | Status: DC

## 2016-10-22 NOTE — ED Triage Notes (Signed)
Pt comes in with cough and congestion starting 2-3 days ago. Denies any n/v/d. Husband has same symptoms.

## 2016-10-22 NOTE — Discharge Instructions (Signed)
Tylenol every 4 hrs if needed.  Plenty of fluids.  Follow-up with your doctor for recheck if needed.

## 2016-10-24 NOTE — ED Provider Notes (Signed)
[facility] DEPT Provider Note   CSN: [id number] Arrival date & time: 10/22/16  1018     History   Chief Complaint Chief Complaint  Patient presents with  . Cough    HPI Sarah [name] is a 56 y.o. female.  HPI  Sarah [name] is a 56 y.o. female who presents to the Emergency Department complaining of nasal congestion and cough for 2 days.  Cough occasionally productive.  She has tried OTC cold medication without relief.  She states that her husband also here for evaluation for same.  Recent sick contacts.  She denies chest pain, shortness of breath, fever, chills and sore throat.   Past Medical History:  Diagnosis Date  . Anxiety   . Diabetes mellitus   . Diverticulosis   . GERD (gastroesophageal reflux disease)   . Hypercholesteremia     Patient Active Problem List   Diagnosis Date Noted  . Trigger middle finger   . Diabetes mellitus type 2 in obese ([place]) 08/19/2015  . 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 Visit for routine gyn exam 08/19/2015  . Type 2 diabetes mellitus ([place]) 06/24/2013  . Rectal bleed 03/23/2012  . HYPERLIPIDEMIA 11/30/2009  . ANXIETY 11/30/2009  . CONSTIPATION 11/30/2009    Past Surgical History:  Procedure Laterality Date  . ABDOMINAL HYSTERECTOMY     complete  . CARDIAC CATHETERIZATION  2011  . COLONOSCOPY  01/04/2010   left sided transverse diverticula/two diminutive rectal polyps  . COLONOSCOPY  03/28/2012   Procedure: COLONOSCOPY;  Surgeon: Daneil Dolin, MD;  [facility];  Service: Endoscopy;  Laterality: N/A;  9:30  . FOOT SURGERY     right-tendon repair  . laparoscopic sigmoid colectomy  02/01/10   Dr Jola Babinski  . TRIGGER FINGER RELEASE Left 03/2015  . TRIGGER FINGER RELEASE Right 02/25/2016   Procedure: RIGHT LONG TRIGGER FINGER RELEASE;  Surgeon: Carole Civil, MD;  Location: [facility];  Service: Orthopedics;  Laterality: Right;    OB History    No data available       Home  Medications    Prior to Admission medications   Medication Sig Start Date End Date Taking? Authorizing Provider  albuterol (PROVENTIL HFA;VENTOLIN HFA) 108 (90 BASE) MCG/ACT inhaler Inhale 1-2 puffs into the lungs every 6 (six) hours as needed for wheezing or shortness of breath. 01/02/14   Nat Christen, MD  albuterol (PROVENTIL) (2.5 MG/3ML) 0.083% nebulizer solution Take 3 mLs (2.5 mg total) by nebulization every 4 (four) hours as needed for wheezing. 05/10/13   Milton Ferguson, MD  chlorpheniramine-HYDROcodone [facility] ER) 10-8 MG/5ML SUER Take 5 mLs by mouth 2 (two) times daily. 10/22/16   Laquida Cotrell, PA-C  diphenhydrAMINE (BENADRYL) 25 MG tablet Take 25 mg by mouth at bedtime as needed for allergies or sleep.     Historical Provider, MD  empagliflozin (JARDIANCE) 10 MG TABS tablet Take 10 mg by mouth daily.    Historical Provider, MD  HYDROcodone-acetaminophen (NORCO/VICODIN) 5-325 MG tablet Take 1 tablet by mouth every 6 (six) hours as needed for moderate pain. 02/25/16   Carole Civil, MD  ibuprofen (ADVIL,MOTRIN) 600 MG tablet Take 1 tablet (600 mg total) by mouth every 6 (six) hours as needed. 05/10/14   Evalee Jefferson, PA-C  Insulin Degludec (TRESIBA FLEXTOUCH) 100 UNIT/ML SOPN Inject into the skin. 34 units at bedtime    Historical Provider, MD  Liraglutide (VICTOZA) 18 MG/3ML SOPN Inject 18 mg into the skin daily.  Historical Provider, MD  loratadine (CLARITIN) 10 MG tablet Take 10 mg by mouth daily as needed for allergies.    Historical Provider, MD  LORazepam (ATIVAN) 0.5 MG tablet Take 0.5 mg by mouth as needed for anxiety.    Historical Provider, MD  metFORMIN (GLUCOPHAGE) 500 MG tablet Take 500 mg by mouth 2 (two) times daily with a meal.    Historical Provider, MD    Family History Family History  Problem Relation Age of Onset  . Colon cancer Maternal Grandfather   . Colon polyps Mother   . Diabetes Mother   . Hypertension Mother   . Heart attack Father   .  Heart murmur Sister   . Hypertension Brother   . Diabetes Son     Social History Social History  Substance Use Topics  . Smoking status: Former Smoker    Packs/day: 1.00    Years: 10.00    Types: Cigarettes  . Smokeless tobacco: Never Used  . Alcohol use Yes     Comment: occasional     Allergies   Sulfonamide derivatives and Latex   Review of Systems Review of Systems  Constitutional: Negative for activity change, appetite change, chills and fever.  HENT: Positive for congestion and rhinorrhea. Negative for facial swelling, sore throat and trouble swallowing.   Eyes: Negative for visual disturbance.  Respiratory: Positive for cough. Negative for shortness of breath, wheezing and stridor.   Cardiovascular: Negative for chest pain.  Gastrointestinal: Negative for abdominal pain, nausea and vomiting.  Musculoskeletal: Negative for neck pain and neck stiffness.  Skin: Negative.   Neurological: Negative for dizziness, weakness, numbness and headaches.  Hematological: Negative for adenopathy.  Psychiatric/Behavioral: Negative for confusion.  All other systems reviewed and are negative.    Physical Exam Updated Vital Signs BP 139/86 (BP Location: Right Leg)   Pulse 73   Temp 98.5 F (36.9 C) (Oral)   Resp 16   Ht 5\' 3"  (1.6 m)   Wt 82.6 kg   SpO2 100%   BMI 32.24 kg/m   Physical Exam  Constitutional: She is oriented to person, place, and time. She appears well-developed and well-nourished. No distress.  HENT:  Head: Normocephalic and atraumatic.  Right Ear: Tympanic membrane and ear canal normal.  Left Ear: Tympanic membrane and ear canal normal.  Nose: Mucosal edema and rhinorrhea present.  Mouth/Throat: Uvula is midline and mucous membranes are normal. No trismus in the jaw. No uvula swelling. Posterior oropharyngeal erythema present. No oropharyngeal exudate, posterior oropharyngeal edema or tonsillar abscesses.  Eyes: Conjunctivae are normal.  Neck: Normal  range of motion and phonation normal. Neck supple. No Brudzinski's sign and no Kernig's sign noted.  Cardiovascular: Normal rate, regular rhythm and intact distal pulses.   No murmur heard. Pulmonary/Chest: Effort normal and breath sounds normal. No respiratory distress. She has no wheezes. She has no rales.  Musculoskeletal: Normal range of motion. She exhibits no edema.  Lymphadenopathy:    She has no cervical adenopathy.  Neurological: She is alert and oriented to person, place, and time. She exhibits normal muscle tone. Coordination normal.  Skin: Skin is warm and dry. No rash noted.  Nursing note and vitals reviewed.    ED Treatments / Results  Labs (all labs ordered are listed, but only abnormal results are displayed) Labs Reviewed - No data to display  EKG  EKG Interpretation None       Radiology No results found.  Procedures Procedures (including critical care time)  Medications  Ordered in ED Medications - No data to display   Initial Impression / Assessment and Plan / ED Course  I have reviewed the triage vital signs and the nursing notes.  Pertinent labs & imaging results that were available during my care of the patient were reviewed by me and considered in my medical decision making (see chart for details).     Pt well appearing, vitals stable.  Likely viral process.  Appears stable for d/c.  Agrees to short course of cough medication  Final Clinical Impressions(s) / ED Diagnoses   Final diagnoses:  Viral URI with cough    New Prescriptions Discharge Medication List as of 10/22/2016  1:01 PM    START taking these medications   Details  chlorpheniramine-HYDROcodone (TUSSIONEX PENNKINETIC ER) 10-8 MG/5ML SUER Take 5 mLs by mouth 2 (two) times daily., Starting Sat 10/22/2016, Print         Kainen Struckman [place], PA-C 10/24/16 2023    Elnora Morrison, MD 10/25/16 [phone]

## 2016-12-09 ENCOUNTER — Emergency Department (HOSPITAL_COMMUNITY)
Admission: EM | Admit: 2016-12-09 | Discharge: 2016-12-09 | Disposition: A | Discharge status: Home / Self Care | Payer: BC Managed Care – PPO | Attending: Emergency Medicine | Admitting: Emergency Medicine

## 2016-12-09 ENCOUNTER — Emergency Department (HOSPITAL_COMMUNITY): Payer: BC Managed Care – PPO

## 2016-12-09 ENCOUNTER — Encounter (HOSPITAL_COMMUNITY): Payer: Self-pay | Admitting: Emergency Medicine

## 2016-12-09 DIAGNOSIS — E1165 Type 2 diabetes mellitus with hyperglycemia: Secondary | ICD-10-CM | POA: Diagnosis not present

## 2016-12-09 DIAGNOSIS — R739 Hyperglycemia, unspecified: Secondary | ICD-10-CM

## 2016-12-09 DIAGNOSIS — R202 Paresthesia of skin: Secondary | ICD-10-CM | POA: Diagnosis not present

## 2016-12-09 DIAGNOSIS — Z794 Long term (current) use of insulin: Secondary | ICD-10-CM | POA: Insufficient documentation

## 2016-12-09 DIAGNOSIS — Z87891 Personal history of nicotine dependence: Secondary | ICD-10-CM | POA: Insufficient documentation

## 2016-12-09 DIAGNOSIS — R200 Anesthesia of skin: Secondary | ICD-10-CM | POA: Diagnosis present

## 2016-12-09 DIAGNOSIS — Z79899 Other long term (current) drug therapy: Secondary | ICD-10-CM | POA: Diagnosis not present

## 2016-12-09 LAB — URINALYSIS, ROUTINE W REFLEX MICROSCOPIC
BACTERIA UA: NONE SEEN
Bilirubin Urine: NEGATIVE
Glucose, UA: >=500 mg/dL — AB
Hgb urine dipstick: NEGATIVE
KETONES UR: 20 mg/dL — AB
Leukocytes, UA: NEGATIVE
Nitrite: NEGATIVE
PH: 5 (ref 5.0–8.0)
Protein, ur: NEGATIVE mg/dL
SPECIFIC GRAVITY, URINE: 1.031 — AB (ref 1.005–1.030)

## 2016-12-09 LAB — BLOOD GAS, VENOUS
ACID-BASE EXCESS: 1.1 mmol/L (ref 0.0–2.0)
BICARBONATE: 23.9 mmol/L (ref 20.0–28.0)
FIO2: 21
O2 Saturation: 59.6 %
PH VEN: 7.366 (ref 7.250–7.430)
PO2 VEN: 34.9 mmHg (ref 32.0–45.0)
Patient temperature: 37
pCO2, Ven: 46.3 mmHg (ref 44.0–60.0)

## 2016-12-09 LAB — CBG MONITORING, ED: GLUCOSE-CAPILLARY: 148 mg/dL — AB (ref 65–99)

## 2016-12-09 LAB — TROPONIN I

## 2016-12-09 LAB — HEPATIC FUNCTION PANEL
ALBUMIN: 3.9 g/dL (ref 3.5–5.0)
ALK PHOS: 96 U/L (ref 38–126)
ALT: 63 U/L — ABNORMAL HIGH (ref 14–54)
AST: 45 U/L — ABNORMAL HIGH (ref 15–41)
BILIRUBIN TOTAL: 0.6 mg/dL (ref 0.3–1.2)
Bilirubin, Direct: 0.1 mg/dL (ref 0.1–0.5)
Indirect Bilirubin: 0.5 mg/dL (ref 0.3–0.9)
Total Protein: 8 g/dL (ref 6.5–8.1)

## 2016-12-09 LAB — CBC
HCT: 42.1 % (ref 36.0–46.0)
Hemoglobin: 15.2 g/dL — ABNORMAL HIGH (ref 12.0–15.0)
MCH: 29 pg (ref 26.0–34.0)
MCHC: 36.1 g/dL — ABNORMAL HIGH (ref 30.0–36.0)
MCV: 80.2 fL (ref 78.0–100.0)
PLATELETS: 264 10*3/uL (ref 150–400)
RBC: 5.25 MIL/uL — ABNORMAL HIGH (ref 3.87–5.11)
RDW: 14 % (ref 11.5–15.5)
WBC: 7.9 10*3/uL (ref 4.0–10.5)

## 2016-12-09 LAB — BASIC METABOLIC PANEL
Anion gap: 9 (ref 5–15)
BUN: 19 mg/dL (ref 6–20)
CHLORIDE: 101 mmol/L (ref 101–111)
CO2: 25 mmol/L (ref 22–32)
CREATININE: 0.65 mg/dL (ref 0.44–1.00)
Calcium: 9.3 mg/dL (ref 8.9–10.3)
GFR calc Af Amer: >60 mL/min (ref >60)
GFR calc non Af Amer: >60 mL/min (ref >60)
Glucose, Bld: 165 mg/dL — ABNORMAL HIGH (ref 65–99)
Potassium: 3.7 mmol/L (ref 3.5–5.1)
Sodium: 135 mmol/L (ref 135–145)

## 2016-12-09 MED ORDER — PROCHLORPERAZINE EDISYLATE 5 MG/ML IJ SOLN
10.0000 mg | Freq: Once | INTRAMUSCULAR | Status: AC
  Administered 2016-12-09: 10 mg via INTRAVENOUS
  Filled 2016-12-09: qty 2

## 2016-12-09 MED ORDER — SODIUM CHLORIDE 0.9 % IV BOLUS (SEPSIS)
1000.0000 mL | Freq: Once | INTRAVENOUS | Status: AC
  Administered 2016-12-09: 1000 mL via INTRAVENOUS

## 2016-12-09 MED ORDER — KETOROLAC TROMETHAMINE 30 MG/ML IJ SOLN
15.0000 mg | Freq: Once | INTRAMUSCULAR | Status: AC
Start: 2016-12-09 — End: 2016-12-09
  Administered 2016-12-09: 15 mg via INTRAVENOUS
  Filled 2016-12-09: qty 1

## 2016-12-09 NOTE — ED Notes (Addendum)
Recall to lab regarding need for lab draw in the [facility]

## 2016-12-09 NOTE — Discharge Instructions (Signed)
As discussed, your evaluation today has been largely reassuring.  But, it is important that you monitor your condition carefully, and do not hesitate to return to the ED if you develop new, or concerning changes in your condition. ? ?Otherwise, please follow-up with your physician for appropriate ongoing care. ? ?

## 2016-12-09 NOTE — ED Provider Notes (Signed)
[facility] DEPT Provider Note   CSN: [id number] Arrival date & time: 12/09/16  1227     History   Chief Complaint Chief Complaint  Patient presents with  . Hyperglycemia  . Numbness    HPI Sarah [name] is a 56 y.o. female.  HPI Patient presents with concern of left arm numbness, weakness, headache, blurred vision. She notes over the past few days she has become gradually more ill. The left arm numbness, weakness is throughout the entirety of the extremity. This occurred without any clear precipitant, since onset has been persistent. Patient only had left arm symptoms until today, when she developed blurry vision bilaterally, headache. She notes that during this time frame she has had persistently elevated glucose readings at home, in spite of taking all medication as directed. Today, with new headache, new blurred vision, she presents for evaluation. She is here with her husband assists with the history of present illness. Past Medical History:  Diagnosis Date  . Anxiety   . Diabetes mellitus   . Diverticulosis   . GERD (gastroesophageal reflux disease)   . Hypercholesteremia     Patient Active Problem List   Diagnosis Date Noted  . Trigger middle finger   . Diabetes mellitus type 2 in obese ([place]) 08/19/2015  . 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 Visit for routine gyn exam 08/19/2015  . Type 2 diabetes mellitus ([place]) 06/24/2013  . Rectal bleed 03/23/2012  . HYPERLIPIDEMIA 11/30/2009  . ANXIETY 11/30/2009  . CONSTIPATION 11/30/2009    Past Surgical History:  Procedure Laterality Date  . ABDOMINAL HYSTERECTOMY     complete  . CARDIAC CATHETERIZATION  2011  . COLONOSCOPY  01/04/2010   left sided transverse diverticula/two diminutive rectal polyps  . COLONOSCOPY  03/28/2012   Procedure: COLONOSCOPY;  Surgeon: Daneil Dolin, MD;  Location: [facility];  Service: Endoscopy;  Laterality: N/A;  9:30  . FOOT SURGERY     right-tendon  repair  . laparoscopic sigmoid colectomy  02/01/10   Dr Jola Babinski  . TRIGGER FINGER RELEASE Left 03/2015  . TRIGGER FINGER RELEASE Right 02/25/2016   Procedure: RIGHT LONG TRIGGER FINGER RELEASE;  Surgeon: Carole Civil, MD;  Location: [facility];  Service: Orthopedics;  Laterality: Right;    OB History    No data available       Home Medications    Prior to Admission medications   Medication Sig Start Date End Date Taking? Authorizing Provider  albuterol (PROVENTIL HFA;VENTOLIN HFA) 108 (90 BASE) MCG/ACT inhaler Inhale 1-2 puffs into the lungs every 6 (six) hours as needed for wheezing or shortness of breath. 01/02/14  Yes Nat Christen, MD  albuterol (PROVENTIL) (2.5 MG/3ML) 0.083% nebulizer solution Take 3 mLs (2.5 mg total) by nebulization every 4 (four) hours as needed for wheezing. 05/10/13  Yes Milton Ferguson, MD  diphenhydrAMINE (BENADRYL) 25 MG tablet Take 25 mg by mouth at bedtime as needed for allergies or sleep.    Yes Historical Provider, MD  empagliflozin (JARDIANCE) 10 MG TABS tablet Take 10 mg by mouth daily.   Yes Historical Provider, MD  HYDROcodone-acetaminophen (NORCO/VICODIN) 5-325 MG tablet Take 1 tablet by mouth every 6 (six) hours as needed for moderate pain. 02/25/16  Yes Carole Civil, MD  ibuprofen (ADVIL,MOTRIN) 600 MG tablet Take 1 tablet (600 mg total) by mouth every 6 (six) hours as needed. 05/10/14  Yes Evalee Jefferson, PA-C  Insulin Degludec (TRESIBA FLEXTOUCH) 100 UNIT/ML SOPN Inject into the skin. [place]  units at bedtime   Yes Historical Provider, MD  loratadine (CLARITIN) 10 MG tablet Take 10 mg by mouth daily as needed for allergies.   Yes Historical Provider, MD  LORazepam (ATIVAN) 0.5 MG tablet Take 0.5 mg by mouth as needed for anxiety.   Yes Historical Provider, MD  metFORMIN (GLUCOPHAGE-XR) 500 MG 24 hr tablet Take 1,000 mg by mouth 2 (two) times daily.  09/06/16  Yes Historical Provider, MD  methocarbamol (ROBAXIN) 750 MG tablet Take 750 mg by mouth  2 (two) times daily as needed for muscle spasms.  11/01/16  Yes Historical Provider, MD  chlorpheniramine-HYDROcodone (TUSSIONEX PENNKINETIC ER) 10-8 MG/5ML SUER Take 5 mLs by mouth 2 (two) times daily. Patient not taking: Reported on 12/09/2016 10/22/16   Kem Parkinson, PA-C    Family History Family History  Problem Relation Age of Onset  . Colon cancer Maternal Grandfather   . Colon polyps Mother   . Diabetes Mother   . Hypertension Mother   . Heart attack Father   . Heart murmur Sister   . Hypertension Brother   . Diabetes Son     Social History Social History  Substance Use Topics  . Smoking status: Former Smoker    Packs/day: 1.00    Years: 10.00    Types: Cigarettes  . Smokeless tobacco: Never Used  . Alcohol use Yes     Comment: occasional     Allergies   Sulfonamide derivatives and Latex   Review of Systems Review of Systems  Constitutional:       Per HPI, otherwise negative  HENT:       Per HPI, otherwise negative  Respiratory:       Per HPI, otherwise negative  Cardiovascular:       Per HPI, otherwise negative  Gastrointestinal: Negative for vomiting.  Endocrine:       Negative aside from HPI  Genitourinary:       Neg aside from HPI   Musculoskeletal:       Per HPI, otherwise negative  Skin: Negative.   Neurological: Negative for syncope.     Physical Exam Updated Vital Signs BP 136/76   Pulse 86   Temp 98.1 F (36.7 C) (Oral)   Resp (!) 28   Ht 5' 3.5" (1.613 m)   Wt 182 lb (82.6 kg)   SpO2 95%   BMI 31.73 kg/m   Physical Exam  Constitutional: She is oriented to person, place, and time. She appears well-developed and well-nourished. No distress.  HENT:  Head: Normocephalic and atraumatic.  Eyes: Conjunctivae and EOM are normal.  Neck:    Cardiovascular: Normal rate and regular rhythm.   Pulmonary/Chest: Effort normal and breath sounds normal. No stridor. No respiratory distress.  Abdominal: She exhibits no distension.    Musculoskeletal: She exhibits no edema.  Neurological: She is alert and oriented to person, place, and time. She displays no atrophy and no tremor. No cranial nerve deficit or sensory deficit. She exhibits normal muscle tone. She displays no seizure activity. Coordination normal.  Skin: Skin is warm and dry.  Psychiatric: She has a normal mood and affect.  Nursing note and vitals reviewed.    ED Treatments / Results  Labs (all labs ordered are listed, but only abnormal results are displayed) Labs Reviewed  BASIC METABOLIC PANEL - Abnormal; Notable for the following:       Result Value   Glucose, Bld 165 (*)    All other components within normal limits  CBC -  Abnormal; Notable for the following:    RBC 5.25 (*)    Hemoglobin 15.2 (*)    MCHC 36.1 (*)    All other components within normal limits  HEPATIC FUNCTION PANEL - Abnormal; Notable for the following:    AST 45 (*)    ALT 63 (*)    All other components within normal limits  CBG MONITORING, ED - Abnormal; Notable for the following:    Glucose-Capillary 148 (*)    All other components within normal limits  TROPONIN I  BLOOD GAS, VENOUS  URINALYSIS, ROUTINE W REFLEX MICROSCOPIC  CBG MONITORING, ED    EKG  EKG Interpretation  Date/Time:  Friday December 09 2016 15:16:05 EDT Ventricular Rate:  73 PR Interval:    QRS Duration: 84 QT Interval:  405 QTC Calculation: 447 R Axis:   68 Text Interpretation:  Sinus rhythm T wave abnormality No significant change since last tracing Artifact Abnormal ekg Confirmed by Carmin Muskrat  MD [phone]) on 12/09/2016 3:49:47 PM       Radiology Dg Chest 2 View  Result Date: 12/09/2016 CLINICAL DATA:  Chest pain and left arm tingling EXAM: CHEST  2 VIEW COMPARISON:  Chest radiograph 12/21/2014 FINDINGS: The heart size and mediastinal contours are within normal limits. Both lungs are clear. The visualized skeletal structures are unremarkable. IMPRESSION: No active cardiopulmonary disease.  Electronically Signed   By: Ulyses Jarred M.D.   On: 12/09/2016 15:43    Procedures Procedures (including critical care time)  Medications Ordered in ED Medications  prochlorperazine (COMPAZINE) injection 10 mg (not administered)  sodium chloride 0.9 % bolus 1,000 mL (0 mLs Intravenous Stopped 12/09/16 1628)  ketorolac (TORADOL) 30 MG/ML injection 15 mg (15 mg Intravenous Given 12/09/16 1515)    4:46 PM On repeat exam the patient is awake and alert.  She continues to complain of headache. Reassuring, reviewed with the patient.    Initial Impression / Assessment and Plan / ED Course  I have reviewed the triage vital signs and the nursing notes.  Pertinent labs & imaging results that were available during my care of the patient were reviewed by me and considered in my medical decision making (see chart for details).   6:24 PM Patient not requesting discharge, I reviewed all findings again with the patient's family members, and her. Patient has received fluid resuscitation, Toradol Compazine, has substantial improvement. The patient presents with concerns of crepitus, left arm paresthesia, hyperglycemia, her evaluation here is generally reassuring, with no evidence for ACS, DKA. With the aforementioned improvement, patient will follow up with primary care.   Final Clinical Impressions(s) / ED Diagnoses   Final diagnoses:  Hyperglycemia  Paresthesia     Carmin Muskrat, MD 12/09/16 [phone]

## 2017-02-12 ENCOUNTER — Encounter (HOSPITAL_COMMUNITY): Payer: Self-pay | Admitting: Emergency Medicine

## 2017-02-12 ENCOUNTER — Emergency Department (HOSPITAL_COMMUNITY): Payer: BC Managed Care – PPO

## 2017-02-12 ENCOUNTER — Emergency Department (HOSPITAL_COMMUNITY)
Admission: EM | Admit: 2017-02-12 | Discharge: 2017-02-12 | Disposition: A | Discharge status: Home / Self Care | Payer: BC Managed Care – PPO | Attending: Emergency Medicine | Admitting: Emergency Medicine

## 2017-02-12 DIAGNOSIS — E119 Type 2 diabetes mellitus without complications: Secondary | ICD-10-CM | POA: Insufficient documentation

## 2017-02-12 DIAGNOSIS — Z87891 Personal history of nicotine dependence: Secondary | ICD-10-CM | POA: Diagnosis not present

## 2017-02-12 DIAGNOSIS — J069 Acute upper respiratory infection, unspecified: Secondary | ICD-10-CM | POA: Diagnosis not present

## 2017-02-12 DIAGNOSIS — Z7984 Long term (current) use of oral hypoglycemic drugs: Secondary | ICD-10-CM | POA: Insufficient documentation

## 2017-02-12 DIAGNOSIS — R0789 Other chest pain: Secondary | ICD-10-CM | POA: Diagnosis present

## 2017-02-12 LAB — CBC
HCT: 39.9 % (ref 36.0–46.0)
HEMOGLOBIN: 14.2 g/dL (ref 12.0–15.0)
MCH: 28.5 pg (ref 26.0–34.0)
MCHC: 35.6 g/dL (ref 30.0–36.0)
MCV: 80.1 fL (ref 78.0–100.0)
PLATELETS: 264 10*3/uL (ref 150–400)
RBC: 4.98 MIL/uL (ref 3.87–5.11)
RDW: 14.4 % (ref 11.5–15.5)
WBC: 6.8 10*3/uL (ref 4.0–10.5)

## 2017-02-12 LAB — BASIC METABOLIC PANEL
ANION GAP: 11 (ref 5–15)
BUN: 14 mg/dL (ref 6–20)
CHLORIDE: 104 mmol/L (ref 101–111)
CO2: 26 mmol/L (ref 22–32)
CREATININE: 0.6 mg/dL (ref 0.44–1.00)
Calcium: 8.9 mg/dL (ref 8.9–10.3)
GFR calc non Af Amer: >60 mL/min (ref >60)
Glucose, Bld: 214 mg/dL — ABNORMAL HIGH (ref 65–99)
Potassium: 4.2 mmol/L (ref 3.5–5.1)
SODIUM: 141 mmol/L (ref 135–145)

## 2017-02-12 LAB — TROPONIN I

## 2017-02-12 MED ORDER — SODIUM CHLORIDE 0.9 % IV BOLUS (SEPSIS)
1000.0000 mL | Freq: Once | INTRAVENOUS | Status: AC
  Administered 2017-02-12: 1000 mL via INTRAVENOUS

## 2017-02-12 MED ORDER — DIPHENHYDRAMINE HCL 50 MG/ML IJ SOLN
25.0000 mg | Freq: Once | INTRAMUSCULAR | Status: AC
  Administered 2017-02-12: 25 mg via INTRAVENOUS
  Filled 2017-02-12: qty 1

## 2017-02-12 MED ORDER — PREDNISONE 10 MG PO TABS: 10.0000 mg | ORAL_TABLET | Freq: Every day | ORAL | 0 refills | Status: DC

## 2017-02-12 MED ORDER — KETOROLAC TROMETHAMINE 30 MG/ML IJ SOLN
30.0000 mg | Freq: Once | INTRAMUSCULAR | Status: AC
  Administered 2017-02-12: 30 mg via INTRAVENOUS
  Filled 2017-02-12: qty 1

## 2017-02-12 MED ORDER — METOCLOPRAMIDE HCL 5 MG/ML IJ SOLN
10.0000 mg | Freq: Once | INTRAMUSCULAR | Status: AC
  Administered 2017-02-12: 10 mg via INTRAVENOUS
  Filled 2017-02-12: qty 2

## 2017-02-12 MED ORDER — IPRATROPIUM-ALBUTEROL 0.5-2.5 (3) MG/3ML IN SOLN
3.0000 mL | Freq: Once | RESPIRATORY_TRACT | Status: AC
  Administered 2017-02-12: 3 mL via RESPIRATORY_TRACT
  Filled 2017-02-12: qty 3

## 2017-02-12 MED ORDER — AZITHROMYCIN 250 MG PO TABS: ORAL_TABLET | ORAL | 0 refills | Status: DC

## 2017-02-12 MED ORDER — HYDROCOD POLST-CPM POLST ER 10-8 MG/5ML PO SUER: 5.0000 mL | Freq: Two times a day (BID) | ORAL | 0 refills | Status: DC | PRN

## 2017-02-12 MED ORDER — HYDROCOD POLST-CPM POLST ER 10-8 MG/5ML PO SUER
5.0000 mL | Freq: Once | ORAL | Status: AC
  Administered 2017-02-12: 5 mL via ORAL
  Filled 2017-02-12: qty 5

## 2017-02-12 MED ORDER — ONDANSETRON HCL 4 MG/2ML IJ SOLN
4.0000 mg | Freq: Once | INTRAMUSCULAR | Status: AC
Start: 2017-02-12 — End: 2017-02-12
  Administered 2017-02-12: 4 mg via INTRAVENOUS
  Filled 2017-02-12: qty 2

## 2017-02-12 NOTE — ED Provider Notes (Addendum)
[facility] DEPT Provider Note   CSN: [id number] Arrival date & time: 02/12/17  1133     History   Chief Complaint Chief Complaint  Patient presents with  . Chest Pain    HPI Sarah [name] is a 56 y.o. female.  Patient presents with nonproductive cough and chest wall pain associated with cough for the past 2 days. Review systems positive for frontal headache. No fever, chills, dyspnea, diaphoresis, nausea. She has type 2 diabetes. No hypertension or cigarette smoking. Severity of symptoms is moderate. Nothing makes symptoms better or worse.      Past Medical History:  Diagnosis Date  . Anxiety   . Diabetes mellitus   . Diverticulosis   . GERD (gastroesophageal reflux disease)   . Hypercholesteremia     Patient Active Problem List   Diagnosis Date Noted  . Trigger middle finger   . Diabetes mellitus type 2 in obese ([place]) 08/19/2015  . 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 Visit for routine gyn exam 08/19/2015  . Type 2 diabetes mellitus ([place]) 06/24/2013  . Rectal bleed 03/23/2012  . HYPERLIPIDEMIA 11/30/2009  . ANXIETY 11/30/2009  . CONSTIPATION 11/30/2009    Past Surgical History:  Procedure Laterality Date  . ABDOMINAL HYSTERECTOMY     complete  . CARDIAC CATHETERIZATION  2011  . COLONOSCOPY  01/04/2010   left sided transverse diverticula/two diminutive rectal polyps  . COLONOSCOPY  03/28/2012   Procedure: COLONOSCOPY;  Surgeon: Daneil Dolin, MD;  Location[facility];  Service: Endoscopy;  Laterality: N/A;  9:30  . FOOT SURGERY     right-tendon repair  . laparoscopic sigmoid colectomy  02/01/10   Dr Jola Babinski  . TRIGGER FINGER RELEASE Left 03/2015  . TRIGGER FINGER RELEASE Right 02/25/2016   Procedure: RIGHT LONG TRIGGER FINGER RELEASE;  Surgeon: Carole Civil, MD;  Location: [facility];  Service: Orthopedics;  Laterality: Right;    OB History    Gravida Para Term Preterm AB Living   1 1 1     1    SAB TAB Ectopic  Multiple Live Births                   Home Medications    Prior to Admission medications   Medication Sig Start Date End Date Taking? Authorizing Provider  albuterol (PROVENTIL) (2.5 MG/3ML) 0.083% nebulizer solution Take 3 mLs (2.5 mg total) by nebulization every 4 (four) hours as needed for wheezing. 05/10/13  Yes Milton Ferguson, MD  empagliflozin (JARDIANCE) 10 MG TABS tablet Take 10 mg by mouth daily.   Yes [provider]  ibuprofen (ADVIL,MOTRIN) 600 MG tablet Take 1 tablet (600 mg total) by mouth every 6 (six) hours as needed. 05/10/14  Yes Idol, Almyra Free, PA-C  LORazepam (ATIVAN) 0.5 MG tablet Take 0.5 mg by mouth as needed for anxiety.   Yes [provider]  metFORMIN (GLUCOPHAGE-XR) 500 MG 24 hr tablet Take 1,000 mg by mouth 2 (two) times daily.  09/06/16  Yes [provider]  azithromycin (ZITHROMAX Z-PAK) 250 MG tablet As directed 02/12/17   Nat Christen, MD  chlorpheniramine-HYDROcodone [facility] ER) 10-8 MG/5ML SUER Take 5 mLs by mouth every 12 (twelve) hours as needed for cough. 02/12/17   Nat Christen, MD  predniSONE (DELTASONE) 10 MG tablet Take 1 tablet (10 mg total) by mouth daily with breakfast. 2 tablets for 4 days; one tablet for 4 days 02/12/17   Nat Christen, MD    Family History Family  History  Problem Relation Age of Onset  . Colon cancer Maternal Grandfather   . Colon polyps Mother   . Diabetes Mother   . Hypertension Mother   . Heart attack Father   . Heart murmur Sister   . Hypertension Brother   . Diabetes Son     Social History Social History  Substance Use Topics  . Smoking status: Former Smoker    Packs/day: 1.00    Years: 10.00    Types: Cigarettes  . Smokeless tobacco: Never Used  . Alcohol use Yes     Comment: occasional     Allergies   Sulfonamide derivatives and Latex   Review of Systems Review of Systems  All other systems reviewed and are negative.    Physical Exam Updated Vital Signs BP  116/73   Pulse 68   Temp 98.2 F (36.8 C) (Oral)   Resp 14   Ht 5\' 3"  (1.6 m)   Wt 81.6 kg (180 lb)   SpO2 96%   BMI 31.89 kg/m   Physical Exam  Constitutional: She is oriented to person, place, and time.  Coughing, no acute distress  HENT:  Head: Normocephalic and atraumatic.  Eyes: Conjunctivae are normal.  Neck: Neck supple.  Cardiovascular: Normal rate and regular rhythm.   Pulmonary/Chest: Effort normal and breath sounds normal.  Abdominal: Soft. Bowel sounds are normal.  Musculoskeletal: Normal range of motion.  Neurological: She is alert and oriented to person, place, and time.  Skin: Skin is warm and dry.  Psychiatric: She has a normal mood and affect. Her behavior is normal.  Nursing note and vitals reviewed.    ED Treatments / Results  Labs (all labs ordered are listed, but only abnormal results are displayed) Labs Reviewed  BASIC METABOLIC PANEL - Abnormal; Notable for the following:       Result Value   Glucose, Bld 214 (*)    All other components within normal limits  CBC  TROPONIN I    EKG  EKG Interpretation  Date/Time:  Sunday February 12 2017 11:54:10 EDT Ventricular Rate:  81 PR Interval:    QRS Duration: 118 QT Interval:  363 QTC Calculation: 422 R Axis:   63 Text Interpretation:  Sinus rhythm Nonspecific intraventricular conduction delay Nonspecific T abnormalities, diffuse leads Minimal ST elevation, anterior leads Confirmed by Nat Christen [phone]) on 02/12/2017 12:14:10 PM       Radiology Dg Chest 2 View  Result Date: 02/12/2017 CLINICAL DATA:  CHEST PAIN, PATIENT STATES " MID STERNUM CHEST PAIN STARTED TODAY, AND DRY COUGH X SEVERAL DAYS, AND DIZZINESS TODAY" HISTORY OF DM, GERD, ANXIETY, HYPERCHOLESTEREMIA EXAM: CHEST  2 VIEW COMPARISON:  Chest x-rays dated 12/09/2016 and 12/21/2014. FINDINGS: The heart size and mediastinal contours are within normal limits. Both lungs are clear. The visualized skeletal structures are unremarkable.  IMPRESSION: No active cardiopulmonary disease. No evidence of pneumonia or pulmonary edema. Electronically Signed   By: Franki Cabot M.D.   On: 02/12/2017 12:42    Procedures Procedures (including critical care time)  Medications Ordered in ED Medications  sodium chloride 0.9 % bolus 1,000 mL (1,000 mLs Intravenous New Bag/Given 02/12/17 1258)  ondansetron (ZOFRAN) injection 4 mg (4 mg Intravenous Given 02/12/17 1302)  ketorolac (TORADOL) 30 MG/ML injection 30 mg (30 mg Intravenous Given 02/12/17 1304)  diphenhydrAMINE (BENADRYL) injection 25 mg (25 mg Intravenous Given 02/12/17 1301)  metoCLOPramide (REGLAN) injection 10 mg (10 mg Intravenous Given 02/12/17 1259)  chlorpheniramine-HYDROcodone (TUSSIONEX) 10-8 MG/5ML suspension 5  mL (5 mLs Oral Given 02/12/17 1305)  ipratropium-albuterol (DUONEB) 0.5-2.5 (3) MG/3ML nebulizer solution 3 mL (3 mLs Nebulization Given 02/12/17 1324)     Initial Impression / Assessment and Plan / ED Course  I have reviewed the triage vital signs and the nursing notes.  Pertinent labs & imaging results that were available during my care of the patient were reviewed by me and considered in my medical decision making (see chart for details).    Patient presents with cough and subsequent chest wall pain. Her glucose is slightly elevated, but otherwise her workup was negative. She's feels better after a nebulizer treatment and Tussionex. Discharge medications Tussionex, prednisone, Zithromax   Final Clinical Impressions(s) / ED Diagnoses   Final diagnoses:  Upper respiratory tract infection, unspecified type    New Prescriptions New Prescriptions   AZITHROMYCIN (ZITHROMAX Z-PAK) 250 MG TABLET    As directed   CHLORPHENIRAMINE-HYDROCODONE (TUSSIONEX PENNKINETIC ER) 10-8 MG/5ML SUER    Take 5 mLs by mouth every 12 (twelve) hours as needed for cough.   PREDNISONE (DELTASONE) 10 MG TABLET    Take 1 tablet (10 mg total) by mouth daily with breakfast. 2 tablets for 4  days; one tablet for 4 days     Nat Christen, MD 02/12/17 1507    Nat Christen, MD 02/12/17 [phone]

## 2017-02-12 NOTE — ED Triage Notes (Signed)
Patient c/o left side chest pain, that radiates into back, and left arm numbness. Per patient shortness of breath, nausea, headache, and light headedness. Denies any cardiac hx. Per patient nonproductive cough that started today making chest pain worse.

## 2017-02-12 NOTE — Discharge Instructions (Signed)
Your sugar is slightly elevated; however, otherwise sure tests are good. Prescription for redness on, cough syrup, antibiotic. Increase fluids. Follow-up your primary care doctor.

## 2017-02-16 ENCOUNTER — Emergency Department (HOSPITAL_COMMUNITY)
Admission: EM | Admit: 2017-02-16 | Discharge: 2017-02-16 | Disposition: A | Discharge status: Home / Self Care | Payer: BC Managed Care – PPO | Attending: Emergency Medicine | Admitting: Emergency Medicine

## 2017-02-16 ENCOUNTER — Encounter (HOSPITAL_COMMUNITY): Payer: Self-pay | Admitting: Cardiology

## 2017-02-16 DIAGNOSIS — T7840XA Allergy, unspecified, initial encounter: Secondary | ICD-10-CM

## 2017-02-16 DIAGNOSIS — Z87891 Personal history of nicotine dependence: Secondary | ICD-10-CM | POA: Diagnosis not present

## 2017-02-16 DIAGNOSIS — L509 Urticaria, unspecified: Secondary | ICD-10-CM

## 2017-02-16 DIAGNOSIS — L500 Allergic urticaria: Secondary | ICD-10-CM | POA: Diagnosis not present

## 2017-02-16 DIAGNOSIS — Z79899 Other long term (current) drug therapy: Secondary | ICD-10-CM | POA: Diagnosis not present

## 2017-02-16 DIAGNOSIS — E119 Type 2 diabetes mellitus without complications: Secondary | ICD-10-CM | POA: Diagnosis not present

## 2017-02-16 DIAGNOSIS — Z7984 Long term (current) use of oral hypoglycemic drugs: Secondary | ICD-10-CM | POA: Insufficient documentation

## 2017-02-16 DIAGNOSIS — R21 Rash and other nonspecific skin eruption: Secondary | ICD-10-CM | POA: Diagnosis present

## 2017-02-16 MED ORDER — DIPHENHYDRAMINE HCL 25 MG PO CAPS: 50.0000 mg | ORAL_CAPSULE | Freq: Once | ORAL | Status: DC

## 2017-02-16 MED ORDER — DIPHENHYDRAMINE HCL 25 MG PO CAPS
25.0000 mg | ORAL_CAPSULE | Freq: Once | ORAL | Status: AC
  Administered 2017-02-16: 25 mg via ORAL
  Filled 2017-02-16: qty 1

## 2017-02-16 MED ORDER — DIPHENHYDRAMINE HCL 25 MG PO CAPS: 25.0000 mg | ORAL_CAPSULE | Freq: Four times a day (QID) | ORAL | 0 refills | Status: DC | PRN

## 2017-02-16 MED ORDER — BENZONATATE 100 MG PO CAPS: 100.0000 mg | ORAL_CAPSULE | Freq: Three times a day (TID) | ORAL | 0 refills | Status: DC

## 2017-02-16 NOTE — ED Triage Notes (Signed)
Rash and itching all over since Tuesday.  Thinks she may be allergic to cough medicine.  States she took the cough medicine last night and she is worse this morning.

## 2017-02-16 NOTE — ED Provider Notes (Signed)
Sarah [name] DEPT Provider Note   CSN: [id number] Arrival date & time: 02/16/17  0855     History   Chief Complaint Chief Complaint  Patient presents with  . Rash    HPI Sarah [name] is a 56 y.o. female.  HPI  56 y.o. female with a hx of DM, presents to the Emergency Department today due to itching since Tuesday. Thought to be allergy to cough medicine. Took last night with worsening symptoms this AM. Notes diffuse rash all over body that is itching. Not painful. No throat closing sensation or tickling sensation. States she believes this is due to cough medication. Pt did not take prednisone or Zpack for her bronchitis yet. Denies fevers. No N/V. Pt attempted to see PCP today, but they were booked. No other symptoms noted.     Past Medical History:  Diagnosis Date  . Anxiety   . Diabetes mellitus   . Diverticulosis   . GERD (gastroesophageal reflux disease)   . Hypercholesteremia     Patient Active Problem List   Diagnosis Date Noted  . Trigger middle finger   . Diabetes mellitus type 2 in obese ([place]) 08/19/2015  . 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 Visit for routine gyn exam 08/19/2015  . Type 2 diabetes mellitus ([place]) 06/24/2013  . Rectal bleed 03/23/2012  . HYPERLIPIDEMIA 11/30/2009  . ANXIETY 11/30/2009  . CONSTIPATION 11/30/2009    Past Surgical History:  Procedure Laterality Date  . ABDOMINAL HYSTERECTOMY     complete  . CARDIAC CATHETERIZATION  2011  . COLONOSCOPY  01/04/2010   left sided transverse diverticula/two diminutive rectal polyps  . COLONOSCOPY  03/28/2012   Procedure: COLONOSCOPY;  Surgeon: Daneil Dolin, MD;  Location: [facility];  Service: Endoscopy;  Laterality: N/A;  9:30  . FOOT SURGERY     right-tendon repair  . laparoscopic sigmoid colectomy  02/01/10   Dr Jola Babinski  . TRIGGER FINGER RELEASE Left 03/2015  . TRIGGER FINGER RELEASE Right 02/25/2016   Procedure: RIGHT LONG TRIGGER FINGER RELEASE;   Surgeon: Carole Civil, MD;  Location: [facility];  Service: Orthopedics;  Laterality: Right;    OB History    Gravida Para Term Preterm AB Living   1 1 1     1    SAB TAB Ectopic Multiple Live Births                   Home Medications    Prior to Admission medications   Medication Sig Start Date End Date Taking? Authorizing Provider  albuterol (PROVENTIL) (2.5 MG/3ML) 0.083% nebulizer solution Take 3 mLs (2.5 mg total) by nebulization every 4 (four) hours as needed for wheezing. 05/10/13   Milton Ferguson, MD  azithromycin (ZITHROMAX Z-PAK) 250 MG tablet As directed 02/12/17   Nat Christen, MD  chlorpheniramine-HYDROcodone [facility]) 10-8 MG/5ML SUER Take 5 mLs by mouth every 12 (twelve) hours as needed for cough. 02/12/17   Nat Christen, MD  empagliflozin (JARDIANCE) 10 MG TABS tablet Take 10 mg by mouth daily.    [provider]  ibuprofen (ADVIL,MOTRIN) 600 MG tablet Take 1 tablet (600 mg total) by mouth every 6 (six) hours as needed. 05/10/14   Evalee Jefferson, PA-C  LORazepam (ATIVAN) 0.5 MG tablet Take 0.5 mg by mouth as needed for anxiety.    [provider]  metFORMIN (GLUCOPHAGE-XR) 500 MG 24 hr tablet Take 1,000 mg by mouth 2 (two) times daily.  09/06/16   [provider]  predniSONE (DELTASONE) 10 MG tablet Take 1 tablet (10 mg total) by mouth daily with breakfast. 2 tablets for 4 days; one tablet for 4 days 02/12/17   Nat Christen, MD    Family History Family History  Problem Relation Age of Onset  . Colon cancer Maternal Grandfather   . Colon polyps Mother   . Diabetes Mother   . Hypertension Mother   . Heart attack Father   . Heart murmur Sister   . Hypertension Brother   . Diabetes Son     Social History Social History  Substance Use Topics  . Smoking status: Former Smoker    Packs/day: 1.00    Years: 10.00    Types: Cigarettes  . Smokeless tobacco: Never Used  . Alcohol use Yes     Comment: occasional     Allergies     Sulfonamide derivatives and Latex   Review of Systems Review of Systems  Constitutional: Negative for fever.  Gastrointestinal: Negative for nausea and vomiting.  Musculoskeletal: Negative for arthralgias and joint swelling.  Skin: Positive for rash. Negative for wound.  Allergic/Immunologic: Negative for immunocompromised state.  Neurological: Negative for dizziness.   Physical Exam Updated Vital Signs BP 110/79   Pulse 85   Temp 98.8 F (37.1 C) (Oral)   Resp 16   Ht 5\' 3"  (1.6 m)   Wt 81.6 kg (180 lb)   SpO2 95%   BMI 31.89 kg/m   Physical Exam  Constitutional: She is oriented to person, place, and time. Vital signs are normal. She appears well-developed and well-nourished. No distress.  HENT:  Head: Normocephalic and atraumatic.  Right Ear: Hearing, tympanic membrane, external ear and ear canal normal.  Left Ear: Hearing, tympanic membrane, external ear and ear canal normal.  Nose: Nose normal.  Mouth/Throat: Uvula is midline, oropharynx is clear and moist and mucous membranes are normal. No trismus in the jaw. No oropharyngeal exudate, posterior oropharyngeal erythema or tonsillar abscesses.  No oropharyngeal edema. Phonating well. NAD  Eyes: Conjunctivae and EOM are normal. Pupils are equal, round, and reactive to light.  Neck: Normal range of motion. Neck supple. No tracheal deviation present.  Cardiovascular: Normal rate, regular rhythm, S1 normal, S2 normal, normal heart sounds, intact distal pulses and normal pulses.   Pulmonary/Chest: Effort normal and breath sounds normal. No respiratory distress. She has no decreased breath sounds. She has no wheezes. She has no rhonchi. She has no rales.  Abdominal: Normal appearance and bowel sounds are normal. There is no tenderness.  Musculoskeletal: Normal range of motion.  Neurological: She is alert and oriented to person, place, and time.  Skin: Skin is warm and dry.  Diffuse urticarial rash. No large weals. No signs of  infection.   Psychiatric: She has a normal mood and affect. Her speech is normal and behavior is normal. Thought content normal.  Nursing note and vitals reviewed.    ED Treatments / Results  Labs (all labs ordered are listed, but only abnormal results are displayed) Labs Reviewed - No data to display  EKG  EKG Interpretation None       Radiology No results found.  Procedures Procedures (including critical care time)  Medications Ordered in ED Medications - No data to display   Initial Impression / Assessment and Plan / ED Course  I have reviewed the triage vital signs and the nursing notes.  Pertinent labs & imaging results that were available during my care of the patient were reviewed by  me and considered in my medical decision making (see chart for details).  Final Clinical Impressions(s) / ED Diagnoses     {I have reviewed the relevant previous healthcare records.  {I obtained HPI from historian.   ED Course:  Assessment: Pt is a 56 y.o. female who presents with rash since taking cough medication given ON Sunday for bronchitis. No airway edema or swelling. Phonating well. Pt states she took medication again last night and felt symptoms. Noted rash systemically. No fevers. Notes itching without pain.. On exam, pt in NAD. Nontoxic/nonseptic appearing. VSS. Afebrile. Lungs CTA. Heart RRR. Abdomen nontender soft. Posterior oropharynx without edema. Phoanting well. No signs of airway compromise. Rash is urticarial and consistent with allergic reaction. Given Benadryl in ED. Pt with Rx steroids for bronchitis and counseled to use this as well. Strict return precautions given. Plan is to DC home with follow up to PCP. At time of discharge, Patient is in no acute distress. Vital Signs are stable. Patient is able to ambulate. Patient able to tolerate PO.   Disposition/Plan:  DC Home Additional Verbal discharge instructions given and discussed with patient.  Pt Instructed to f/u  with PCP in the next week for evaluation and treatment of symptoms. Return precautions given Pt acknowledges and agrees with plan  Supervising Physician Milton Ferguson, MD  Final diagnoses:  Allergic reaction, initial encounter  Urticarial rash    New Prescriptions New Prescriptions   No medications on file     Shary Decamp, Hershal Coria 02/16/17 6067    Milton Ferguson, MD 02/17/17 1022

## 2017-02-16 NOTE — Discharge Instructions (Signed)
Please read and follow all provided instructions.  Your diagnoses today include:  1. Allergic reaction, initial encounter   2. Urticarial rash     Tests performed today include: Vital signs. See below for your results today.   Medications prescribed:  Take as prescribed   Home care instructions:  Follow any educational materials contained in this packet.  Follow-up instructions: Please follow-up with your primary care provider for further evaluation of symptoms and treatment   Return instructions:  Please return to the Emergency Department if you do not get better, if you get worse, or new symptoms OR  - Fever (temperature greater than 101.50F)  - Bleeding that does not stop with holding pressure to the area    -Severe pain (please note that you may be more sore the day after your accident)  - Chest Pain  - Difficulty breathing  - Severe nausea or vomiting  - Inability to tolerate food and liquids  - Passing out  - Skin becoming red around your wounds  - Change in mental status (confusion or lethargy)  - New numbness or weakness    Please return if you have any other emergent concerns.  Additional Information:  Your vital signs today were: BP 110/79    Pulse 85    Temp 98.8 F (37.1 C) (Oral)    Resp 16    Ht 5\' 3"  (1.6 m)    Wt 81.6 kg (180 lb)    SpO2 95%    BMI 31.89 kg/m  If your blood pressure (BP) was elevated above 135/85 this visit, please have this repeated by your doctor within one month. ---------------

## 2017-03-09 ENCOUNTER — Other Ambulatory Visit (HOSPITAL_COMMUNITY): Payer: Self-pay | Admitting: Internal Medicine

## 2017-03-09 DIAGNOSIS — Z1231 Encounter for screening mammogram for malignant neoplasm of breast: Secondary | ICD-10-CM

## 2017-03-13 ENCOUNTER — Ambulatory Visit (HOSPITAL_COMMUNITY)
Admission: RE | Admit: 2017-03-13 | Discharge: 2017-03-13 | Disposition: A | Discharge status: Home / Self Care | Payer: BC Managed Care – PPO | Source: Ambulatory Visit | Attending: Internal Medicine | Admitting: Internal Medicine

## 2017-03-13 DIAGNOSIS — Z1231 Encounter for screening mammogram for malignant neoplasm of breast: Secondary | ICD-10-CM | POA: Diagnosis not present

## 2017-09-05 ENCOUNTER — Emergency Department (HOSPITAL_COMMUNITY)
Admission: EM | Admit: 2017-09-05 | Discharge: 2017-09-05 | Disposition: A | Discharge status: Home / Self Care | Payer: BC Managed Care – PPO | Attending: Emergency Medicine | Admitting: Emergency Medicine

## 2017-09-05 ENCOUNTER — Encounter (HOSPITAL_COMMUNITY): Payer: Self-pay | Admitting: Emergency Medicine

## 2017-09-05 ENCOUNTER — Other Ambulatory Visit: Payer: Self-pay

## 2017-09-05 DIAGNOSIS — E119 Type 2 diabetes mellitus without complications: Secondary | ICD-10-CM | POA: Insufficient documentation

## 2017-09-05 DIAGNOSIS — R252 Cramp and spasm: Secondary | ICD-10-CM | POA: Diagnosis not present

## 2017-09-05 DIAGNOSIS — Z79899 Other long term (current) drug therapy: Secondary | ICD-10-CM | POA: Diagnosis not present

## 2017-09-05 DIAGNOSIS — Z91040 Latex allergy status: Secondary | ICD-10-CM | POA: Diagnosis not present

## 2017-09-05 DIAGNOSIS — Z87891 Personal history of nicotine dependence: Secondary | ICD-10-CM | POA: Insufficient documentation

## 2017-09-05 DIAGNOSIS — R739 Hyperglycemia, unspecified: Secondary | ICD-10-CM | POA: Diagnosis not present

## 2017-09-05 LAB — BASIC METABOLIC PANEL
Anion gap: 11 (ref 5–15)
BUN: 15 mg/dL (ref 6–20)
CALCIUM: 9 mg/dL (ref 8.9–10.3)
CO2: 27 mmol/L (ref 22–32)
CREATININE: 0.79 mg/dL (ref 0.44–1.00)
Chloride: 94 mmol/L — ABNORMAL LOW (ref 101–111)
Glucose, Bld: 371 mg/dL — ABNORMAL HIGH (ref 65–99)
Potassium: 4.1 mmol/L (ref 3.5–5.1)
SODIUM: 132 mmol/L — AB (ref 135–145)

## 2017-09-05 LAB — CBC WITH DIFFERENTIAL/PLATELET
BASOS PCT: 0 %
Basophils Absolute: 0 10*3/uL (ref 0.0–0.1)
EOS ABS: 0.1 10*3/uL (ref 0.0–0.7)
EOS PCT: 1 %
HCT: 39.8 % (ref 36.0–46.0)
HEMOGLOBIN: 14 g/dL (ref 12.0–15.0)
Lymphocytes Relative: 38 %
Lymphs Abs: 3.6 10*3/uL (ref 0.7–4.0)
MCH: 28.9 pg (ref 26.0–34.0)
MCHC: 35.2 g/dL (ref 30.0–36.0)
MCV: 82.2 fL (ref 78.0–100.0)
MONOS PCT: 6 %
Monocytes Absolute: 0.6 10*3/uL (ref 0.1–1.0)
NEUTROS PCT: 55 %
Neutro Abs: 5.1 10*3/uL (ref 1.7–7.7)
PLATELETS: 284 10*3/uL (ref 150–400)
RBC: 4.84 MIL/uL (ref 3.87–5.11)
RDW: 14.2 % (ref 11.5–15.5)
WBC: 9.4 10*3/uL (ref 4.0–10.5)

## 2017-09-05 LAB — MAGNESIUM: MAGNESIUM: 1.8 mg/dL (ref 1.7–2.4)

## 2017-09-05 MED ORDER — ONDANSETRON HCL 4 MG PO TABS
4.0000 mg | ORAL_TABLET | Freq: Once | ORAL | Status: AC
Start: 2017-09-05 — End: 2017-09-05
  Administered 2017-09-05: 4 mg via ORAL
  Filled 2017-09-05: qty 1

## 2017-09-05 MED ORDER — METHOCARBAMOL 500 MG PO TABS: 500.0000 mg | ORAL_TABLET | Freq: Three times a day (TID) | ORAL | 0 refills | Status: DC

## 2017-09-05 MED ORDER — CYCLOBENZAPRINE HCL 10 MG PO TABS
10.0000 mg | ORAL_TABLET | Freq: Once | ORAL | Status: AC
  Administered 2017-09-05: 10 mg via ORAL
  Filled 2017-09-05: qty 1

## 2017-09-05 MED ORDER — HYDROCODONE-ACETAMINOPHEN 5-325 MG PO TABS
2.0000 | ORAL_TABLET | Freq: Once | ORAL | Status: AC
  Administered 2017-09-05: 2 via ORAL
  Filled 2017-09-05: qty 2

## 2017-09-05 NOTE — ED Provider Notes (Signed)
[facility] EMERGENCY DEPARTMENT Provider Note   CSN: [id number] Arrival date & time: 09/05/17  1708     History   Chief Complaint Chief Complaint  Patient presents with  . Leg Pain    HPI Sarah [name] is a 57 y.o. female.  Patient is a 57 year old female who presents to the emergency department with complaint of leg cramps.  The problem started on January 6.  The patient noted particularly at night that she was having cramps of her legs.  She tried conservative measures but these were not helpful.  She tried Tylenol but this too did not seem to help her problem. The patient denies any changes in her usual activities.  She has not been having excessive vomiting or diarrhea.  She has not had any recent illness.  There is been no operations or procedures involving the lower extremity.  It is of note that the patient had a similar problem approximately a year ago, but she did not see her doctor concerning this.  The patient has a history of diabetes mellitus.  She states that her glucose has been elevated recently over the last few days.  She presents now for assistance with this issue.      Past Medical History:  Diagnosis Date  . Anxiety   . Diabetes mellitus   . Diverticulosis   . GERD (gastroesophageal reflux disease)   . Hypercholesteremia     Patient Active Problem List   Diagnosis Date Noted  . Trigger middle finger   . Diabetes mellitus type 2 in obese ([place]) 08/19/2015  . 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 Visit for routine gyn exam 08/19/2015  . Type 2 diabetes mellitus ([place]) 06/24/2013  . Rectal bleed 03/23/2012  . HYPERLIPIDEMIA 11/30/2009  . ANXIETY 11/30/2009  . CONSTIPATION 11/30/2009    Past Surgical History:  Procedure Laterality Date  . ABDOMINAL HYSTERECTOMY     complete  . CARDIAC CATHETERIZATION  2011  . COLONOSCOPY  01/04/2010   left sided transverse diverticula/two diminutive rectal polyps  . COLONOSCOPY  03/28/2012   Procedure: COLONOSCOPY;  Surgeon: Daneil Dolin, MD;  Location: [facility];  Service: Endoscopy;  Laterality: N/A;  9:30  . FOOT SURGERY     right-tendon repair  . laparoscopic sigmoid colectomy  02/01/10   Dr Jola Babinski  . TRIGGER FINGER RELEASE Left 03/2015  . TRIGGER FINGER RELEASE Right 02/25/2016   Procedure: RIGHT LONG TRIGGER FINGER RELEASE;  Surgeon: Carole Civil, MD;  Location: [facility];  Service: Orthopedics;  Laterality: Right;    OB History    Gravida Para Term Preterm AB Living   1 1 1     1    SAB TAB Ectopic Multiple Live Births                   Home Medications    Prior to Admission medications   Medication Sig Start Date End Date Taking? Authorizing Provider  albuterol (PROVENTIL) (2.5 MG/3ML) 0.083% nebulizer solution Take 3 mLs (2.5 mg total) by nebulization every 4 (four) hours as needed for wheezing. 05/10/13   Milton Ferguson, MD  azithromycin (ZITHROMAX Z-PAK) 250 MG tablet As directed 02/12/17   Nat Christen, MD  benzonatate (TESSALON) 100 MG capsule Take 1 capsule (100 mg total) by mouth every 8 (eight) hours. 02/16/17   Shary Decamp, PA-C  chlorpheniramine-HYDROcodone [facility]) 10-8 MG/5ML SUER Take 5 mLs by mouth every 12 (twelve) hours as needed for cough. 02/12/17  Nat Christen, MD  diphenhydrAMINE (BENADRYL) 25 mg capsule Take 1 capsule (25 mg total) by mouth every 6 (six) hours as needed. 02/16/17   Shary Decamp, PA-C  empagliflozin (JARDIANCE) 10 MG TABS tablet Take 10 mg by mouth daily.    [provider]  ibuprofen (ADVIL,MOTRIN) 600 MG tablet Take 1 tablet (600 mg total) by mouth every 6 (six) hours as needed. 05/10/14   Evalee Jefferson, PA-C  LORazepam (ATIVAN) 0.5 MG tablet Take 0.5 mg by mouth as needed for anxiety.    [provider]  metFORMIN (GLUCOPHAGE-XR) 500 MG 24 hr tablet Take 1,000 mg by mouth 2 (two) times daily.  09/06/16   [provider]  predniSONE (DELTASONE) 10 MG tablet Take 1 tablet  (10 mg total) by mouth daily with breakfast. 2 tablets for 4 days; one tablet for 4 days 02/12/17   Nat Christen, MD    Family History Family History  Problem Relation Age of Onset  . Colon cancer Maternal Grandfather   . Colon polyps Mother   . Diabetes Mother   . Hypertension Mother   . Heart attack Father   . Heart murmur Sister   . Hypertension Brother   . Diabetes Son     Social History Social History   Tobacco Use  . Smoking status: Former Smoker    Packs/day: 1.00    Years: 10.00    Pack years: 10.00    Types: Cigarettes  . Smokeless tobacco: Never Used  Substance Use Topics  . Alcohol use: Yes    Comment: occasional  . Drug use: No     Allergies   Sulfonamide derivatives and Latex   Review of Systems Review of Systems  Constitutional: Negative for activity change.       All ROS Neg except as noted in HPI  HENT: Negative for nosebleeds.   Eyes: Negative for photophobia and discharge.  Respiratory: Negative for cough, shortness of breath and wheezing.   Cardiovascular: Negative for chest pain and palpitations.  Gastrointestinal: Negative for abdominal pain and blood in stool.  Genitourinary: Negative for dysuria, frequency and hematuria.  Musculoskeletal: Positive for myalgias. Negative for arthralgias, back pain and neck pain.  Skin: Negative.   Neurological: Negative for dizziness, seizures and speech difficulty.  Psychiatric/Behavioral: Negative for confusion and hallucinations.     Physical Exam Updated Vital Signs BP (!) 142/86 (BP Location: Right Arm)   Pulse 93   Temp 98.5 F (36.9 C) (Oral)   Resp 18   Ht 5\' 3"  (1.6 m)   Wt 83 kg (183 lb)   SpO2 99%   BMI 32.42 kg/m   Physical Exam  Constitutional: She appears well-developed and well-nourished. No distress.  HENT:  Head: Normocephalic and atraumatic.  Right Ear: External ear normal.  Left Ear: External ear normal.  Eyes: Conjunctivae are normal. Right eye exhibits no discharge. Left  eye exhibits no discharge. No scleral icterus.  Neck: Neck supple. No tracheal deviation present.  Cardiovascular: Normal rate, regular rhythm and intact distal pulses.  Pulmonary/Chest: Effort normal and breath sounds normal. No stridor. No respiratory distress. She has no wheezes. She has no rales.  Abdominal: Soft. Bowel sounds are normal. She exhibits no distension. There is no tenderness. There is no rebound and no guarding.  Musculoskeletal: She exhibits tenderness. She exhibits no edema.  There is soreness of the thigh as well as the calf of both legs.  There are no palpable fasciculations at this time.  The dorsalis pedis  pulses 2+.  There are no temperature changes of the lower extremities.  Neurological: She is alert. She has normal strength. No cranial nerve deficit (no facial droop, extraocular movements intact, no slurred speech) or sensory deficit. She exhibits normal muscle tone. She displays no seizure activity. Coordination normal.  Skin: Skin is warm and dry. No rash noted.  Psychiatric: She has a normal mood and affect.  Nursing note and vitals reviewed.    ED Treatments / Results  Labs (all labs ordered are listed, but only abnormal results are displayed) Labs Reviewed  BASIC METABOLIC PANEL - Abnormal; Notable for the following components:      Result Value   Sodium 132 (*)    Chloride 94 (*)    Glucose, Bld 371 (*)    All other components within normal limits  CBC WITH DIFFERENTIAL/PLATELET  MAGNESIUM    EKG  EKG Interpretation None       Radiology No results found.  Procedures Procedures (including critical care time)  Medications Ordered in ED Medications  cyclobenzaprine (FLEXERIL) tablet 10 mg (10 mg Oral Given 09/05/17 2033)  HYDROcodone-acetaminophen (NORCO/VICODIN) 5-325 MG per tablet 2 tablet (2 tablets Oral Given 09/05/17 2033)  ondansetron [facility]) tablet 4 mg (4 mg Oral Given 09/05/17 2033)     Initial Impression / Assessment and Plan /  ED Course  I have reviewed the triage vital signs and the nursing notes.  Pertinent labs & imaging results that were available during my care of the patient were reviewed by me and considered in my medical decision making (see chart for details).       Final Clinical Impressions(s) / ED Diagnoses MDM Vital signs reviewed.  Pulse oximetry is 99% on room air.  Within normal limits by my interpretation.  The magnesium is within normal limits at 1.8.  The complete blood count is normal.  In particular there is no anemia noted.  The basic metabolic panel shows the glucose to be elevated at 371.  The sodium is slightly low at 132 and the chloride is slightly low at 94.  The potassium is normal at 4.1.  The BUN and creatinine are both within normal limits.  The anion gap is normal at 11.  No recent injury or trauma, no recent problems with vomiting or diarrhea.  No evidence of hypocalcemia hypomagnesemia or alkalosis.  No recent changes in the specific task.  No insect bites and no trauma to the lower extremities.  Patient was treated in the emergency department with muscle relaxer and one Norco tablet.  Recheck.  Patient states she has no pain.  She feels tremendously better than when she came in.  I have asked the patient to see Dr. [name] to complete the workup and for evaluation of possible vascular related reason for this spasm issue.  The patient promises that she will call Dr. [name] tomorrow and get an appointment set up.  Patient will be treated as an outpatient with Robaxin 3 times daily.  She will use Tylenol extra strength for soreness.  I have asked her to return to the emergency department if any changes, problems, or concerns.  Patient is in agreement with this plan.   Final diagnoses:  Bilateral leg cramps  Hyperglycemia    ED Discharge Orders        Ordered    methocarbamol (ROBAXIN) 500 MG tablet  3 times daily     09/05/17 2120       Lily Kocher, PA-C 09/05/17 2129  Davonna Belling, MD 09/05/17 [phone]

## 2017-09-05 NOTE — Discharge Instructions (Addendum)
Your magnesium, calcium, and potassium are all within normal limits.  Your glucose is elevated at 371.  Please monitor this closely.  Please use Robaxin 3 times daily.This medication may cause drowsiness. Please do not drink, drive, or participate in activity that requires concentration while taking this medication.  Use Tylenol every 4 hours for soreness.  Please see Dr. [name] to complete your workup concerning your spasms, and possibly evaluate your circulation in the lower extremities as soon as possible.Marland Kitchen

## 2017-09-05 NOTE — ED Triage Notes (Signed)
Pt c/o of leg cramping in both legs for 2 days. No swelling noted.

## 2017-10-15 ENCOUNTER — Other Ambulatory Visit: Payer: Self-pay

## 2017-10-15 ENCOUNTER — Encounter (HOSPITAL_COMMUNITY): Payer: Self-pay | Admitting: *Deleted

## 2017-10-15 DIAGNOSIS — S8002XA Contusion of left knee, initial encounter: Secondary | ICD-10-CM | POA: Diagnosis not present

## 2017-10-15 DIAGNOSIS — Y998 Other external cause status: Secondary | ICD-10-CM | POA: Insufficient documentation

## 2017-10-15 DIAGNOSIS — E119 Type 2 diabetes mellitus without complications: Secondary | ICD-10-CM | POA: Diagnosis not present

## 2017-10-15 DIAGNOSIS — Z7984 Long term (current) use of oral hypoglycemic drugs: Secondary | ICD-10-CM | POA: Diagnosis not present

## 2017-10-15 DIAGNOSIS — Y939 Activity, unspecified: Secondary | ICD-10-CM | POA: Insufficient documentation

## 2017-10-15 DIAGNOSIS — Z87891 Personal history of nicotine dependence: Secondary | ICD-10-CM | POA: Diagnosis not present

## 2017-10-15 DIAGNOSIS — Y92008 Other place in unspecified non-institutional (private) residence as the place of occurrence of the external cause: Secondary | ICD-10-CM | POA: Insufficient documentation

## 2017-10-15 DIAGNOSIS — E7800 Pure hypercholesterolemia, unspecified: Secondary | ICD-10-CM | POA: Insufficient documentation

## 2017-10-15 DIAGNOSIS — Z91040 Latex allergy status: Secondary | ICD-10-CM | POA: Insufficient documentation

## 2017-10-15 DIAGNOSIS — Z79899 Other long term (current) drug therapy: Secondary | ICD-10-CM | POA: Diagnosis not present

## 2017-10-15 DIAGNOSIS — W010XXA Fall on same level from slipping, tripping and stumbling without subsequent striking against object, initial encounter: Secondary | ICD-10-CM | POA: Diagnosis not present

## 2017-10-15 DIAGNOSIS — S8992XA Unspecified injury of left lower leg, initial encounter: Secondary | ICD-10-CM | POA: Diagnosis present

## 2017-10-15 NOTE — ED Triage Notes (Signed)
Pt c/o left knee pain; pt states she fell on her porch x 2 days ago and has been having increased pain to her knee

## 2017-10-16 ENCOUNTER — Emergency Department (HOSPITAL_COMMUNITY): Payer: BC Managed Care – PPO

## 2017-10-16 ENCOUNTER — Emergency Department (HOSPITAL_COMMUNITY)
Admission: EM | Admit: 2017-10-16 | Discharge: 2017-10-16 | Disposition: A | Discharge status: Home / Self Care | Payer: BC Managed Care – PPO | Attending: Emergency Medicine | Admitting: Emergency Medicine

## 2017-10-16 DIAGNOSIS — W19XXXA Unspecified fall, initial encounter: Secondary | ICD-10-CM

## 2017-10-16 DIAGNOSIS — S8002XA Contusion of left knee, initial encounter: Secondary | ICD-10-CM

## 2017-10-16 MED ORDER — NAPROXEN 500 MG PO TABS: 500.0000 mg | ORAL_TABLET | Freq: Two times a day (BID) | ORAL | 0 refills | Status: DC

## 2017-10-16 MED ORDER — NAPROXEN 250 MG PO TABS
500.0000 mg | ORAL_TABLET | Freq: Once | ORAL | Status: AC
  Administered 2017-10-16: 500 mg via ORAL
  Filled 2017-10-16: qty 2

## 2017-10-16 NOTE — Discharge Instructions (Signed)
Follow-up with Dr. Aline Brochure.  You may need an MRI of your knee if you continue to have problems.  Return to the ED if you develop new or worsening symptoms.

## 2017-10-16 NOTE — ED Provider Notes (Signed)
[facility] EMERGENCY DEPARTMENT Provider Note   CSN: [id number] Arrival date & time: 10/15/17  2333     History   Chief Complaint Chief Complaint  Patient presents with  . Knee Pain    HPI Sarah [name] is a 57 y.o. female.  Patient complains of left knee pain after falling from porch about 2 days ago.  She states she slipped in the rain and fell about 1-2 feet onto her left knee.  Did not have any problems with his knee prior to this.  She had increased pain in the knee tonight after going to work.  She is been taking Tylenol and ibuprofen at home.  Pain is in the left lateral knee and worse with palpation and movement.  No focal weakness, numbness or tingling.  No bowel or bladder incontinence.  No neck or back pain.   The history is provided by the patient.  Knee Pain      Past Medical History:  Diagnosis Date  . Anxiety   . Diabetes mellitus   . Diverticulosis   . GERD (gastroesophageal reflux disease)   . Hypercholesteremia     Patient Active Problem List   Diagnosis Date Noted  . Trigger middle finger   . Diabetes mellitus type 2 in obese ([place]) 08/19/2015  . 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 Visit for routine gyn exam 08/19/2015  . Type 2 diabetes mellitus ([place]) 06/24/2013  . Rectal bleed 03/23/2012  . HYPERLIPIDEMIA 11/30/2009  . ANXIETY 11/30/2009  . CONSTIPATION 11/30/2009    Past Surgical History:  Procedure Laterality Date  . ABDOMINAL HYSTERECTOMY     complete  . CARDIAC CATHETERIZATION  2011  . COLONOSCOPY  01/04/2010   left sided transverse diverticula/two diminutive rectal polyps  . COLONOSCOPY  03/28/2012   Procedure: COLONOSCOPY;  Surgeon: Daneil Dolin, MD;  Location: [facility];  Service: Endoscopy;  Laterality: N/A;  9:30  . FOOT SURGERY     right-tendon repair  . laparoscopic sigmoid colectomy  02/01/10   Dr Jola Babinski  . TRIGGER FINGER RELEASE Left 03/2015  . TRIGGER FINGER RELEASE Right 02/25/2016    Procedure: RIGHT LONG TRIGGER FINGER RELEASE;  Surgeon: Carole Civil, MD;  Location: [facility] ORS;  Service: Orthopedics;  Laterality: Right;    OB History    Gravida Para Term Preterm AB Living   1 1 1     1    SAB TAB Ectopic Multiple Live Births                   Home Medications    Prior to Admission medications   Medication Sig Start Date End Date Taking? Authorizing Provider  albuterol (PROVENTIL) (2.5 MG/3ML) 0.083% nebulizer solution Take 3 mLs (2.5 mg total) by nebulization every 4 (four) hours as needed for wheezing. 05/10/13   Milton Ferguson, MD  azithromycin (ZITHROMAX Z-PAK) 250 MG tablet As directed 02/12/17   Nat Christen, MD  benzonatate (TESSALON) 100 MG capsule Take 1 capsule (100 mg total) by mouth every 8 (eight) hours. 02/16/17   Shary Decamp, PA-C  chlorpheniramine-HYDROcodone [facility]) 10-8 MG/5ML SUER Take 5 mLs by mouth every 12 (twelve) hours as needed for cough. 02/12/17   Nat Christen, MD  diphenhydrAMINE (BENADRYL) 25 mg capsule Take 1 capsule (25 mg total) by mouth every 6 (six) hours as needed. 02/16/17   Shary Decamp, PA-C  empagliflozin (JARDIANCE) 10 MG TABS tablet Take 10 mg by mouth daily.  [provider]  ibuprofen (ADVIL,MOTRIN) 600 MG tablet Take 1 tablet (600 mg total) by mouth every 6 (six) hours as needed. 05/10/14   Evalee Jefferson, PA-C  LORazepam (ATIVAN) 0.5 MG tablet Take 0.5 mg by mouth as needed for anxiety.    [provider]  metFORMIN (GLUCOPHAGE-XR) 500 MG 24 hr tablet Take 1,000 mg by mouth 2 (two) times daily.  09/06/16   [provider]  methocarbamol (ROBAXIN) 500 MG tablet Take 1 tablet (500 mg total) by mouth 3 (three) times daily. 09/05/17   Lily Kocher, PA-C  predniSONE (DELTASONE) 10 MG tablet Take 1 tablet (10 mg total) by mouth daily with breakfast. 2 tablets for 4 days; one tablet for 4 days 02/12/17   Nat Christen, MD    Family History Family History  Problem Relation Age of Onset  .  Colon cancer Maternal Grandfather   . Colon polyps Mother   . Diabetes Mother   . Hypertension Mother   . Heart attack Father   . Heart murmur Sister   . Hypertension Brother   . Diabetes Son     Social History Social History   Tobacco Use  . Smoking status: Former Smoker    Packs/day: 1.00    Years: 10.00    Pack years: 10.00    Types: Cigarettes  . Smokeless tobacco: Never Used  Substance Use Topics  . Alcohol use: Yes    Comment: occasional  . Drug use: No     Allergies   Sulfonamide derivatives and Latex   Review of Systems Review of Systems  Constitutional: Negative for activity change, appetite change and fever.  HENT: Negative for congestion and rhinorrhea.   Eyes: Negative for visual disturbance.  Respiratory: Negative for cough, chest tightness and shortness of breath.   Gastrointestinal: Negative for abdominal pain, nausea and vomiting.  Genitourinary: Negative for dysuria, frequency, vaginal bleeding and vaginal discharge.  Musculoskeletal: Positive for arthralgias and myalgias. Negative for back pain and neck pain.  Neurological: Negative for dizziness, weakness and headaches.   all other systems are negative except as noted in the HPI and PMH.     Physical Exam Updated Vital Signs BP (!) 133/94 (BP Location: Right Arm)   Pulse 79   Temp 98.2 F (36.8 C) (Oral)   Resp 18   Ht 5\' 3"  (1.6 m)   Wt 81.6 kg (180 lb)   SpO2 99%   BMI 31.89 kg/m   Physical Exam  Constitutional: She is oriented to person, place, and time. She appears well-developed and well-nourished. No distress.  HENT:  Head: Normocephalic and atraumatic.  Mouth/Throat: Oropharynx is clear and moist. No oropharyngeal exudate.  Eyes: Conjunctivae and EOM are normal. Pupils are equal, round, and reactive to light.  Neck: Normal range of motion. Neck supple.  No meningismus.  Cardiovascular: Normal rate, regular rhythm, normal heart sounds and intact distal pulses.  No murmur  heard. Pulmonary/Chest: Effort normal and breath sounds normal. No respiratory distress. She exhibits no tenderness.  Abdominal: Soft. There is no tenderness. There is no rebound and no guarding.  Musculoskeletal: Normal range of motion. She exhibits tenderness. She exhibits no edema or deformity.  Left knee appears normal to inspection. Flexion and extension intact. L lateral knee tenderness. No ligament laxity. Able to lift leg and keep knee extended No tibial plateau tenderness. Compartments soft.   Neurological: She is alert and oriented to person, place, and time. No cranial nerve deficit. She exhibits normal muscle tone. Coordination  normal.  No ataxia on finger to nose bilaterally. No pronator drift. 5/5 strength throughout. CN 2-12 intact.Equal grip strength. Sensation intact.   Skin: Skin is warm. Capillary refill takes less than 2 seconds. No rash noted. No erythema.  Psychiatric: She has a normal mood and affect. Her behavior is normal.  Nursing note and vitals reviewed.    ED Treatments / Results  Labs (all labs ordered are listed, but only abnormal results are displayed) Labs Reviewed - No data to display  EKG  EKG Interpretation None       Radiology Dg Knee Complete 4 Views Left  Result Date: 10/16/2017 CLINICAL DATA:  Fall EXAM: LEFT KNEE - COMPLETE 4+ VIEW COMPARISON:  None. FINDINGS: Degenerative changes in the left knee with joint space narrowing and spurring, most pronounced in the lateral compartment. No acute bony abnormality. Specifically, no fracture, subluxation, or dislocation. IMPRESSION: Mild degenerative changes.  No acute bony abnormality. Electronically Signed   By: Rolm Baptise M.D.   On: 10/16/2017 00:49    Procedures Procedures (including critical care time)  Medications Ordered in ED Medications  naproxen (NAPROSYN) tablet 500 mg (not administered)     Initial Impression / Assessment and Plan / ED Course  I have reviewed the triage  vital signs and the nursing notes.  Pertinent labs & imaging results that were available during my care of the patient were reviewed by me and considered in my medical decision making (see chart for details).    Left knee pain after fall from porch several days ago.  No other injury.  Patient is neurovascularly intact.  X-rays negative for fracture or dislocation.  Patient has tenderness across left lateral joint line.  There is no tibial plateau tenderness.  Patient is able to bear weight.  She will be given a knee sleeve.  She has crutches at home.  Discussed supportive care with anti-inflammatories, rice therapy, PCP and orthopedic follow-up.  Return precautions discussed.  Final Clinical Impressions(s) / ED Diagnoses   Final diagnoses:  Fall, initial encounter  Contusion of left knee, initial encounter    ED Discharge Orders    None       Mariana Wiederholt, Annie Main, MD 10/16/17 [phone]

## 2018-02-26 ENCOUNTER — Emergency Department (HOSPITAL_COMMUNITY)
Admission: EM | Admit: 2018-02-26 | Discharge: 2018-02-26 | Disposition: A | Discharge status: Home / Self Care | Payer: BC Managed Care – PPO | Attending: Emergency Medicine | Admitting: Emergency Medicine

## 2018-02-26 ENCOUNTER — Emergency Department (HOSPITAL_COMMUNITY): Payer: BC Managed Care – PPO

## 2018-02-26 ENCOUNTER — Encounter (HOSPITAL_COMMUNITY): Payer: Self-pay | Admitting: Emergency Medicine

## 2018-02-26 ENCOUNTER — Other Ambulatory Visit: Payer: Self-pay

## 2018-02-26 DIAGNOSIS — E119 Type 2 diabetes mellitus without complications: Secondary | ICD-10-CM | POA: Diagnosis not present

## 2018-02-26 DIAGNOSIS — Z794 Long term (current) use of insulin: Secondary | ICD-10-CM | POA: Insufficient documentation

## 2018-02-26 DIAGNOSIS — Z87891 Personal history of nicotine dependence: Secondary | ICD-10-CM | POA: Diagnosis not present

## 2018-02-26 DIAGNOSIS — Z79899 Other long term (current) drug therapy: Secondary | ICD-10-CM | POA: Diagnosis not present

## 2018-02-26 DIAGNOSIS — R079 Chest pain, unspecified: Secondary | ICD-10-CM | POA: Diagnosis present

## 2018-02-26 DIAGNOSIS — R0789 Other chest pain: Secondary | ICD-10-CM | POA: Insufficient documentation

## 2018-02-26 LAB — BASIC METABOLIC PANEL
Anion gap: 7 (ref 5–15)
BUN: 12 mg/dL (ref 6–20)
CALCIUM: 8.7 mg/dL — AB (ref 8.9–10.3)
CHLORIDE: 106 mmol/L (ref 98–111)
CO2: 25 mmol/L (ref 22–32)
CREATININE: 0.74 mg/dL (ref 0.44–1.00)
GFR calc Af Amer: >60 mL/min (ref >60)
GFR calc non Af Amer: >60 mL/min (ref >60)
Glucose, Bld: 282 mg/dL — ABNORMAL HIGH (ref 70–99)
Potassium: 3.7 mmol/L (ref 3.5–5.1)
SODIUM: 138 mmol/L (ref 135–145)

## 2018-02-26 LAB — CBC WITH DIFFERENTIAL/PLATELET
Basophils Absolute: 0 10*3/uL (ref 0.0–0.1)
Basophils Relative: 0 %
EOS ABS: 0.1 10*3/uL (ref 0.0–0.7)
Eosinophils Relative: 1 %
HEMATOCRIT: 37.1 % (ref 36.0–46.0)
Hemoglobin: 13 g/dL (ref 12.0–15.0)
LYMPHS ABS: 2.4 10*3/uL (ref 0.7–4.0)
Lymphocytes Relative: 33 %
MCH: 28.9 pg (ref 26.0–34.0)
MCHC: 35 g/dL (ref 30.0–36.0)
MCV: 82.4 fL (ref 78.0–100.0)
MONO ABS: 0.6 10*3/uL (ref 0.1–1.0)
MONOS PCT: 8 %
Neutro Abs: 4.1 10*3/uL (ref 1.7–7.7)
Neutrophils Relative %: 58 %
Platelets: 268 10*3/uL (ref 150–400)
RBC: 4.5 MIL/uL (ref 3.87–5.11)
RDW: 14.6 % (ref 11.5–15.5)
WBC: 7.2 10*3/uL (ref 4.0–10.5)

## 2018-02-26 LAB — TROPONIN I
Troponin I: <0.03 ng/mL (ref <0.03)
Troponin I: <0.03 ng/mL (ref <0.03)

## 2018-02-26 LAB — D-DIMER, QUANTITATIVE: D-Dimer, Quant: 0.33 ug/mL-FEU (ref 0.00–0.50)

## 2018-02-26 MED ORDER — HYDROCODONE-ACETAMINOPHEN 5-325 MG PO TABS: 1.0000 | ORAL_TABLET | Freq: Four times a day (QID) | ORAL | 0 refills | Status: DC | PRN

## 2018-02-26 MED ORDER — SODIUM CHLORIDE 0.9 % IV BOLUS
500.0000 mL | Freq: Once | INTRAVENOUS | Status: AC
  Administered 2018-02-26: 500 mL via INTRAVENOUS

## 2018-02-26 MED ORDER — KETOROLAC TROMETHAMINE 30 MG/ML IJ SOLN
15.0000 mg | Freq: Once | INTRAMUSCULAR | Status: AC
  Administered 2018-02-26: 15 mg via INTRAVENOUS
  Filled 2018-02-26: qty 1

## 2018-02-26 MED ORDER — MORPHINE SULFATE (PF) 4 MG/ML IV SOLN
4.0000 mg | Freq: Once | INTRAVENOUS | Status: AC
  Administered 2018-02-26: 4 mg via INTRAVENOUS
  Filled 2018-02-26: qty 1

## 2018-02-26 MED ORDER — PREDNISONE 20 MG PO TABS: 40.0000 mg | ORAL_TABLET | Freq: Every day | ORAL | 0 refills | Status: DC

## 2018-02-26 MED ORDER — TRAMADOL HCL 50 MG PO TABS: 50.0000 mg | ORAL_TABLET | Freq: Four times a day (QID) | ORAL | 0 refills | Status: DC | PRN

## 2018-02-26 NOTE — ED Notes (Signed)
EDP in with pt 

## 2018-02-26 NOTE — Discharge Instructions (Signed)
As discussed, your evaluation today has been largely reassuring.  But, it is important that you monitor your condition carefully, and do not hesitate to return to the ED if you develop new, or concerning changes in your condition. ? ?Otherwise, please follow-up with your physician for appropriate ongoing care. ? ?

## 2018-02-26 NOTE — ED Provider Notes (Signed)
[facility] EMERGENCY DEPARTMENT Provider Note   CSN: [id number] Arrival date & time: 02/26/18  1200     History   Chief Complaint Chief Complaint  Patient presents with  . Chest Pain    HPI Sarah [name] is a 57 y.o. female.  HPI Patient presents with left-sided chest pain. Pain began while she was at work, but not doing anything particularly strenuous. Since onset about 2 hours ago the pain is been focally in the left inframammary area, radiating to the left posterior axilla. Pain is sore, severe, sharp, pleuritic, and exertional. No medication taken for pain relief. No lightheadedness, no nausea, vomiting, urinary changes, abdominal pain, other chest pain.  Past Medical History:  Diagnosis Date  . Anxiety   . Diabetes mellitus   . Diverticulosis   . GERD (gastroesophageal reflux disease)   . Hypercholesteremia     Patient Active Problem List   Diagnosis Date Noted  . Trigger middle finger   . Diabetes mellitus type 2 in obese ([place]) 08/19/2015  . 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 Visit for routine gyn exam 08/19/2015  . Type 2 diabetes mellitus ([place]) 06/24/2013  . Rectal bleed 03/23/2012  . HYPERLIPIDEMIA 11/30/2009  . ANXIETY 11/30/2009  . CONSTIPATION 11/30/2009    Past Surgical History:  Procedure Laterality Date  . ABDOMINAL HYSTERECTOMY     complete  . CARDIAC CATHETERIZATION  2011  . COLONOSCOPY  01/04/2010   left sided transverse diverticula/two diminutive rectal polyps  . COLONOSCOPY  03/28/2012   Procedure: COLONOSCOPY;  Surgeon: Daneil Dolin, MD;  Location: [facility];  Service: Endoscopy;  Laterality: N/A;  9:30  . FOOT SURGERY     right-tendon repair  . laparoscopic sigmoid colectomy  02/01/10   Dr Jola Babinski  . TRIGGER FINGER RELEASE Left 03/2015  . TRIGGER FINGER RELEASE Right 02/25/2016   Procedure: RIGHT LONG TRIGGER FINGER RELEASE;  Surgeon: Carole Civil, MD;  Location: [facility] ORS;  Service:  Orthopedics;  Laterality: Right;     OB History    Gravida  1   Para  1   Term  1   Preterm      AB      Living  1     SAB      TAB      Ectopic      Multiple      Live Births               Home Medications    Prior to Admission medications   Medication Sig Start Date End Date Taking? Authorizing Provider  albuterol (PROVENTIL) (2.5 MG/3ML) 0.083% nebulizer solution Take 3 mLs (2.5 mg total) by nebulization every 4 (four) hours as needed for wheezing. 05/10/13  Yes Milton Ferguson, MD  GLIPIZIDE XL 10 MG 24 hr tablet Take 10 mg by mouth 2 (two) times daily. 02/19/18  Yes [provider]  ibuprofen (ADVIL,MOTRIN) 600 MG tablet Take 1 tablet (600 mg total) by mouth every 6 (six) hours as needed. 05/10/14  Yes Idol, Almyra Free, PA-C  LORazepam (ATIVAN) 0.5 MG tablet Take 0.5 mg by mouth as needed for anxiety.   Yes [provider]  losartan (COZAAR) 25 MG tablet Take 25 mg by mouth daily. 02/19/18  Yes [provider]  metFORMIN (GLUCOPHAGE-XR) 500 MG 24 hr tablet Take 1,000 mg by mouth 2 (two) times daily.  09/06/16  Yes [provider]  TRESIBA FLEXTOUCH 100 UNIT/ML SOPN FlexTouch Pen Inject 55  Units into the skin at bedtime. 01/29/18  Yes [provider]    Family History Family History  Problem Relation Age of Onset  . Colon cancer Maternal Grandfather   . Colon polyps Mother   . Diabetes Mother   . Hypertension Mother   . Heart attack Father   . Heart murmur Sister   . Hypertension Brother   . Diabetes Son     Social History Social History   Tobacco Use  . Smoking status: Former Smoker    Packs/day: 1.00    Years: 10.00    Pack years: 10.00    Types: Cigarettes  . Smokeless tobacco: Never Used  Substance Use Topics  . Alcohol use: Yes    Comment: occasional  . Drug use: No     Allergies   Sulfonamide derivatives and Latex   Review of Systems Review of Systems  Constitutional:       Per HPI, otherwise  negative  HENT:       Per HPI, otherwise negative  Respiratory:       Per HPI, otherwise negative  Cardiovascular:       Per HPI, otherwise negative  Gastrointestinal: Negative for vomiting.  Endocrine:       Negative aside from HPI  Genitourinary:       Neg aside from HPI   Musculoskeletal:       Per HPI, otherwise negative  Skin: Positive for wound.  Neurological: Negative for syncope.     Physical Exam Updated Vital Signs BP 138/68   Pulse 82   Temp 98.2 F (36.8 C) (Oral)   Resp 16   SpO2 100%   Physical Exam  Constitutional: She is oriented to person, place, and time. She appears well-developed and well-nourished. No distress.  HENT:  Head: Normocephalic and atraumatic.  Eyes: Conjunctivae and EOM are normal.  Cardiovascular: Normal rate and regular rhythm.  Pulmonary/Chest: Effort normal and breath sounds normal. No stridor. No respiratory distress.  Abdominal: She exhibits no distension.    Musculoskeletal: She exhibits no edema.  Neurological: She is alert and oriented to person, place, and time. No cranial nerve deficit.  Skin: Skin is warm and dry.  Psychiatric: She has a normal mood and affect.  Nursing note and vitals reviewed.    ED Treatments / Results  Labs (all labs ordered are listed, but only abnormal results are displayed) Labs Reviewed  BASIC METABOLIC PANEL - Abnormal; Notable for the following components:      Result Value   Glucose, Bld 282 (*)    Calcium 8.7 (*)    All other components within normal limits  CBC WITH DIFFERENTIAL/PLATELET  TROPONIN I  D-DIMER, QUANTITATIVE (NOT AT [facility])  TROPONIN I    EKG EKG Interpretation  Date/Time:  Monday February 26 2018 12:07:45 EDT Ventricular Rate:  96 PR Interval:    QRS Duration: 81 QT Interval:  340 QTC Calculation: 430 R Axis:   79 Text Interpretation:  Sinus rhythm RSR' in V1 or V2, right VCD or RVH T wave abnormality No significant change since last tracing Abnormal ekg Confirmed  by Carmin Muskrat [id number]) on 02/26/2018 12:33:11 PM   Radiology Dg Chest 2 View  Result Date: 02/26/2018 CLINICAL DATA:  Chest pain EXAM: CHEST - 2 VIEW COMPARISON:  02/12/2017 FINDINGS: Artifact overlies the chest. Heart size is normal. There is aortic atherosclerosis. The lungs are clear. The vascularity is normal. No effusions. No bone abnormality. IMPRESSION: No active disease.  Aortic atherosclerosis. Electronically  Signed   By: Nelson Chimes M.D.   On: 02/26/2018 12:53    Procedures Procedures (including critical care time)  Medications Ordered in ED Medications  sodium chloride 0.9 % bolus 500 mL (0 mLs Intravenous Stopped 02/26/18 1252)  ketorolac (TORADOL) 30 MG/ML injection 15 mg (15 mg Intravenous Given 02/26/18 1255)  morphine 4 MG/ML injection 4 mg (4 mg Intravenous Given 02/26/18 1428)     Initial Impression / Assessment and Plan / ED Course  I have reviewed the triage vital signs and the nursing notes.  Pertinent labs & imaging results that were available during my care of the patient were reviewed by me and considered in my medical decision making (see chart for details).     3:37 PM Awake, alert, in no distress. Second values now available, with 2 normal troponin, nonischemic EKG, and improved pain, there is low suspicion for atypical ACS. Patient's x-ray does not suggest pneumonia.  She does now note that she has had a cough, and there is some suspicion for either viral process or early bronchitis, but no evidence for hypoxia, respiratory distress requiring admission. With improvement, patient discharged in stable condition with primary care follow-up.  Final Clinical Impressions(s) / ED Diagnoses  Atypical chest pain   Carmin Muskrat, MD 02/26/18 1538

## 2018-02-26 NOTE — ED Triage Notes (Addendum)
Pt at work and c/o left side cp. cbg en route 353. A/o. Pt c/o sharp pain under left breast radiating into back. Pain is worse with movement and deep breathing. Non diaphoretic. Pt c/o prod cough x 2 days with green and brown mucus.

## 2018-03-02 ENCOUNTER — Other Ambulatory Visit: Payer: Self-pay | Admitting: Internal Medicine

## 2018-03-02 DIAGNOSIS — Z780 Asymptomatic menopausal state: Secondary | ICD-10-CM

## 2018-03-27 ENCOUNTER — Other Ambulatory Visit: Payer: Self-pay | Admitting: Internal Medicine

## 2018-03-27 DIAGNOSIS — Z1231 Encounter for screening mammogram for malignant neoplasm of breast: Secondary | ICD-10-CM

## 2018-04-10 ENCOUNTER — Ambulatory Visit (INDEPENDENT_AMBULATORY_CARE_PROVIDER_SITE_OTHER): Payer: BC Managed Care – PPO | Admitting: Cardiovascular Disease

## 2018-04-10 ENCOUNTER — Encounter: Payer: Self-pay | Admitting: Cardiovascular Disease

## 2018-04-10 VITALS — BP 134/76 | HR 89 | Ht 63.0 in | Wt 191.0 lb

## 2018-04-10 DIAGNOSIS — E785 Hyperlipidemia, unspecified: Secondary | ICD-10-CM | POA: Diagnosis not present

## 2018-04-10 DIAGNOSIS — R079 Chest pain, unspecified: Secondary | ICD-10-CM

## 2018-04-10 DIAGNOSIS — I10 Essential (primary) hypertension: Secondary | ICD-10-CM

## 2018-04-10 DIAGNOSIS — Z9289 Personal history of other medical treatment: Secondary | ICD-10-CM

## 2018-04-10 DIAGNOSIS — E119 Type 2 diabetes mellitus without complications: Secondary | ICD-10-CM

## 2018-04-10 NOTE — Progress Notes (Signed)
CARDIOLOGY CONSULT NOTE  Patient ID: Sarah [name] MRN: [id number] DOB/AGE: 11/08/1960 57 y.o.  Admit date: (Not on file) Primary Physician: Celene Squibb, MD Referring Physician: Dr. [name]  Reason for Consultation: Chest pain  HPI: Sarah [name] is a 57 y.o. female who is being seen today for the evaluation of chest pain at the request of Celene Squibb, MD.   Past medical history includes hypertension and type 2 diabetes mellitus.  She was evaluated in the ED at [facility] for chest pain on 02/26/2018.  I reviewed all relevant documentation, labs, and studies.  She was at work and developed left-sided chest pain primarily in the left inframammary area and radiating to the left posterior axilla.  Troponins, CBC, and d-dimer were normal.  Basic metabolic panel was unremarkable.  Chest x-ray was unremarkable.  It did show some aortic atherosclerosis.  There is some suspicion for either a viral process or early bronchitis.  Chest pain was felt to be atypical for ischemic heart disease.  I personally reviewed the ECG which demonstrated sinus rhythm with T wave inversions inferiorly and V3 through V6.  I compared her most recent ECG with ECGs performed in June 2018 and April 2012 there have been no significant changes over the years.  She reportedly underwent a cardiac catheterization in 2011 at [facility].  I am unable to locate the report.  She tells me it was normal.  She currently denies any chest pain and chest tightness and also denies shortness of breath.  She seldom has palpitations which are self-limited and last seconds.  Her husband has not been working for the past 2 years due to a hip surgery which needed a second operation.  She has been working 2 jobs for the past 8 months and has been more tired over the past 6 months.  Family history: Father died of an MI at age 60.    Allergies  Allergen Reactions  . Sulfonamide Derivatives Itching  . Latex Itching  and Rash    Powdered ONly    Current Outpatient Medications  Medication Sig Dispense Refill  . albuterol (PROVENTIL) (2.5 MG/3ML) 0.083% nebulizer solution Take 3 mLs (2.5 mg total) by nebulization every 4 (four) hours as needed for wheezing. 30 vial 0  . GLIPIZIDE XL 10 MG 24 hr tablet Take 10 mg by mouth 2 (two) times daily.    Marland Kitchen HYDROcodone-acetaminophen (NORCO/VICODIN) 5-325 MG tablet Take 1 tablet by mouth every 6 (six) hours as needed for severe pain. 15 tablet 0  . ibuprofen (ADVIL,MOTRIN) 600 MG tablet Take 1 tablet (600 mg total) by mouth every 6 (six) hours as needed. 30 tablet 0  . LORazepam (ATIVAN) 0.5 MG tablet Take 0.5 mg by mouth as needed for anxiety.    Marland Kitchen losartan (COZAAR) 25 MG tablet Take 25 mg by mouth daily.    . metFORMIN (GLUCOPHAGE-XR) 500 MG 24 hr tablet Take 1,000 mg by mouth 2 (two) times daily.     . predniSONE (DELTASONE) 20 MG tablet Take 2 tablets (40 mg total) by mouth daily with breakfast. For the next four days 8 tablet 0  . traMADol (ULTRAM) 50 MG tablet Take 1 tablet (50 mg total) by mouth every 6 (six) hours as needed. 15 tablet 0  . TRESIBA FLEXTOUCH 100 UNIT/ML SOPN FlexTouch Pen Inject 55 Units into the skin at bedtime.     No current facility-administered medications for this visit.  Past Medical History:  Diagnosis Date  . Anxiety   . Diabetes mellitus   . Diverticulosis   . GERD (gastroesophageal reflux disease)   . Hypercholesteremia     Past Surgical History:  Procedure Laterality Date  . ABDOMINAL HYSTERECTOMY     complete  . CARDIAC CATHETERIZATION  2011  . COLONOSCOPY  01/04/2010   left sided transverse diverticula/two diminutive rectal polyps  . COLONOSCOPY  03/28/2012   Procedure: COLONOSCOPY;  Surgeon: Daneil Dolin, MD;  Location: [facility];  Service: Endoscopy;  Laterality: N/A;  9:30  . FOOT SURGERY     right-tendon repair  . laparoscopic sigmoid colectomy  02/01/10   Dr Jola Babinski  . TRIGGER FINGER  RELEASE Left 03/2015  . TRIGGER FINGER RELEASE Right 02/25/2016   Procedure: RIGHT LONG TRIGGER FINGER RELEASE;  Surgeon: Carole Civil, MD;  Location: [facility] OR[facility];  Service: Orthopedics;  Laterality: Right;    Social History   Socioeconomic History  . Marital status: Married    Spouse name: Not on file  . Number of children: 1  . Years of education: Not on file  . Highest education level: Not on file  Occupational History  . Occupation: K Pharmacist, hospital asst    Employer: [employer]  Social Needs  . Financial resource strain: Not on file  . Food insecurity:    Worry: Not on file    Inability: Not on file  . Transportation needs:    Medical: Not on file    Non-medical: Not on file  Tobacco Use  . Smoking status: Former Smoker    Packs/day: 1.00    Years: 10.00    Pack years: 10.00    Types: Cigarettes  . Smokeless tobacco: Never Used  Substance and Sexual Activity  . Alcohol use: Yes    Comment: occasional  . Drug use: No  . Sexual activity: Yes    Birth control/protection: Surgical    Comment: hyst  Lifestyle  . Physical activity:    Days per week: Not on file    Minutes per session: Not on file  . Stress: Not on file  Relationships  . Social connections:    Talks on phone: Not on file    Gets together: Not on file    Attends religious service: Not on file    Active member of club or organization: Not on file    Attends meetings of clubs or organizations: Not on file    Relationship status: Not on file  . Intimate partner violence:    Fear of current or ex partner: Not on file    Emotionally abused: Not on file    Physically abused: Not on file    Forced sexual activity: Not on file  Other Topics Concern  . Not on file  Social History Narrative   Lives w/ husband, sister & her children , teen mom & her baby       Current Meds  Medication Sig  . albuterol (PROVENTIL) (2.5 MG/3ML) 0.083% nebulizer solution Take 3 mLs (2.5 mg total) by nebulization every  4 (four) hours as needed for wheezing.  Marland Kitchen GLIPIZIDE XL 10 MG 24 hr tablet Take 10 mg by mouth 2 (two) times daily.  Marland Kitchen HYDROcodone-acetaminophen (NORCO/VICODIN) 5-325 MG tablet Take 1 tablet by mouth every 6 (six) hours as needed for severe pain.  Marland Kitchen ibuprofen (ADVIL,MOTRIN) 600 MG tablet Take 1 tablet (600 mg total) by mouth every 6 (six) hours as needed.  Marland Kitchen LORazepam (  ATIVAN) 0.5 MG tablet Take 0.5 mg by mouth as needed for anxiety.  Marland Kitchen losartan (COZAAR) 25 MG tablet Take 25 mg by mouth daily.  . metFORMIN (GLUCOPHAGE-XR) 500 MG 24 hr tablet Take 1,000 mg by mouth 2 (two) times daily.   . predniSONE (DELTASONE) 20 MG tablet Take 2 tablets (40 mg total) by mouth daily with breakfast. For the next four days  . traMADol (ULTRAM) 50 MG tablet Take 1 tablet (50 mg total) by mouth every 6 (six) hours as needed.  Tyler Aas FLEXTOUCH 100 UNIT/ML SOPN FlexTouch Pen Inject 55 Units into the skin at bedtime.      Review of systems complete and found to be negative unless listed above in HPI    Physical exam Blood pressure 134/76, pulse 89, height 5\' 3"  (1.6 m), weight 191 lb (86.6 kg), SpO2 96 %. General: NAD Neck: No JVD, no thyromegaly or thyroid nodule.  Lungs: Clear to auscultation bilaterally with normal respiratory effort. CV: Nondisplaced PMI. Regular rate and rhythm, normal S1/S2, no S3/S4, no murmur.  No peripheral edema.  No carotid bruit.   Abdomen: Soft, nontender, no distention.  Skin: Intact without lesions or rashes.  Neurologic: Alert and oriented x 3.  Psych: Normal affect. Extremities: No clubbing or cyanosis.  HEENT: Normal.   ECG: Most recent ECG reviewed.   Labs: Lab Results  Component Value Date/Time   K 3.7 02/26/2018 12:46 PM   BUN 12 02/26/2018 12:46 PM   CREATININE 0.74 02/26/2018 12:46 PM   ALT 63 (H) 12/09/2016 01:33 PM   TSH 1.485 12/11/2010 04:23 AM   HGB 13.0 02/26/2018 12:46 PM     Lipids: Lab Results  Component Value Date/Time   LDLCALC (H)  12/11/2010 04:23 AM    126        Total Cholesterol/HDL:CHD Risk Coronary Heart Disease Risk Table                     Men   Women  1/2 Average Risk   3.4   3.3  Average Risk       5.0   4.4  2 X Average Risk   9.6   7.1  3 X Average Risk  23.4   11.0        Use the calculated Patient Ratio above and the CHD Risk Table to determine the patient's CHD Risk.        ATP III CLASSIFICATION (LDL):  <100     mg/dL   Optimal  100-129  mg/dL   Near or Above                    Optimal  130-159  mg/dL   Borderline  160-189  mg/dL   High  >190     mg/dL   Very High   CHOL  12/11/2010 04:23 AM    172        ATP III CLASSIFICATION:  <200     mg/dL   Desirable  200-239  mg/dL   Borderline High  >=240    mg/dL   High          TRIG 137 12/11/2010 04:23 AM   HDL 19 (L) 12/11/2010 04:23 AM        ASSESSMENT AND PLAN:  1.  Chest pain with abnormal ECG: No recurrence of symptoms.  She has several cardiovascular risk factors.  ECG appears to be chronically abnormal.  I will obtain an exercise Myoview stress  test for further clarification.  2.  Hypertension: Controlled on present therapy.  No changes.  3.  Hypercholesterolemia: Currently on statin therapy.  I will obtain a copy of lipids from PCP.  4.  Type 2 diabetes mellitus: Currently on glipizide and metformin.     Disposition: Follow up in 2 to 3 months  Signed: Kate Sable, M.D., F.A.C.C.  04/10/2018, 3:02 PM

## 2018-04-10 NOTE — Patient Instructions (Addendum)
Your physician recommends that you schedule a follow-up appointment in: 2-3 months   Your physician recommends that you continue on your current medications as directed. Please refer to the Current Medication list given to you today.    If you need a refill on your cardiac medications before your next appointment, please call your pharmacy.     Your physician has requested that you have en exercise stress myoview. For further information please visit [email or web address]. Please follow instruction sheet, as given.       No lab work today      Thank you for [place] !

## 2018-04-11 ENCOUNTER — Ambulatory Visit (HOSPITAL_COMMUNITY): Payer: BC Managed Care – PPO

## 2018-04-11 ENCOUNTER — Other Ambulatory Visit (HOSPITAL_COMMUNITY): Payer: BC Managed Care – PPO

## 2018-04-24 ENCOUNTER — Encounter (HOSPITAL_BASED_OUTPATIENT_CLINIC_OR_DEPARTMENT_OTHER)
Admission: RE | Admit: 2018-04-24 | Discharge: 2018-04-24 | Disposition: A | Discharge status: Home / Self Care | Payer: BC Managed Care – PPO | Source: Ambulatory Visit | Attending: Cardiovascular Disease | Admitting: Cardiovascular Disease

## 2018-04-24 ENCOUNTER — Encounter (HOSPITAL_COMMUNITY)
Admission: RE | Admit: 2018-04-24 | Discharge: 2018-04-24 | Disposition: A | Discharge status: Home / Self Care | Payer: BC Managed Care – PPO | Source: Ambulatory Visit | Attending: Cardiovascular Disease | Admitting: Cardiovascular Disease

## 2018-04-24 ENCOUNTER — Encounter (HOSPITAL_COMMUNITY): Payer: Self-pay

## 2018-04-24 DIAGNOSIS — R079 Chest pain, unspecified: Secondary | ICD-10-CM

## 2018-04-24 LAB — NM MYOCAR MULTI W/SPECT W/WALL MOTION / EF
CHL RATE OF PERCEIVED EXERTION: 13
Estimated workload: 7 METS
Exercise duration (min): 5 min
Exercise duration (sec): 30 s
LHR: 0.51
LV sys vol: 15 mL
LVDIAVOL: 57 mL (ref 46–106)
MPHR: 161 {beats}/min
Peak HR: 144 {beats}/min
Percent HR: 89 %
Rest HR: 60 {beats}/min
SDS: 3
SRS: 3
SSS: 6
TID: 1.27

## 2018-04-24 MED ORDER — SODIUM CHLORIDE 0.9% FLUSH
INTRAVENOUS | Status: AC
  Administered 2018-04-24: 10 mL via INTRAVENOUS
  Filled 2018-04-24: qty 10

## 2018-04-24 MED ORDER — REGADENOSON 0.4 MG/5ML IV SOLN
INTRAVENOUS | Status: AC
  Filled 2018-04-24: qty 5

## 2018-04-24 MED ORDER — TECHNETIUM TC 99M TETROFOSMIN IV KIT
10.0000 | PACK | Freq: Once | INTRAVENOUS | Status: AC | PRN
  Administered 2018-04-24: 8.1 via INTRAVENOUS

## 2018-04-24 MED ORDER — TECHNETIUM TC 99M TETROFOSMIN IV KIT
30.0000 | PACK | Freq: Once | INTRAVENOUS | Status: AC | PRN
  Administered 2018-04-24: 23.3 via INTRAVENOUS

## 2018-05-07 ENCOUNTER — Ambulatory Visit (HOSPITAL_COMMUNITY)
Admission: RE | Admit: 2018-05-07 | Discharge: 2018-05-07 | Disposition: A | Discharge status: Home / Self Care | Payer: BC Managed Care – PPO | Source: Ambulatory Visit | Attending: Internal Medicine | Admitting: Internal Medicine

## 2018-05-07 DIAGNOSIS — Z780 Asymptomatic menopausal state: Secondary | ICD-10-CM

## 2018-05-07 DIAGNOSIS — Z1231 Encounter for screening mammogram for malignant neoplasm of breast: Secondary | ICD-10-CM

## 2018-05-08 ENCOUNTER — Other Ambulatory Visit (HOSPITAL_COMMUNITY): Payer: Self-pay | Admitting: Internal Medicine

## 2018-05-08 ENCOUNTER — Ambulatory Visit (HOSPITAL_COMMUNITY)
Admission: RE | Admit: 2018-05-08 | Discharge: 2018-05-08 | Disposition: A | Discharge status: Home / Self Care | Payer: BC Managed Care – PPO | Source: Ambulatory Visit | Attending: Internal Medicine | Admitting: Internal Medicine

## 2018-05-08 DIAGNOSIS — R109 Unspecified abdominal pain: Secondary | ICD-10-CM | POA: Insufficient documentation

## 2018-05-08 DIAGNOSIS — I700 Atherosclerosis of aorta: Secondary | ICD-10-CM | POA: Diagnosis not present

## 2018-05-08 DIAGNOSIS — K439 Ventral hernia without obstruction or gangrene: Secondary | ICD-10-CM | POA: Insufficient documentation

## 2018-05-08 DIAGNOSIS — K5730 Diverticulosis of large intestine without perforation or abscess without bleeding: Secondary | ICD-10-CM | POA: Insufficient documentation

## 2018-05-08 DIAGNOSIS — Z9049 Acquired absence of other specified parts of digestive tract: Secondary | ICD-10-CM | POA: Diagnosis not present

## 2018-05-08 DIAGNOSIS — R52 Pain, unspecified: Secondary | ICD-10-CM

## 2018-05-08 DIAGNOSIS — K5900 Constipation, unspecified: Secondary | ICD-10-CM | POA: Diagnosis present

## 2018-05-08 DIAGNOSIS — R1900 Intra-abdominal and pelvic swelling, mass and lump, unspecified site: Secondary | ICD-10-CM

## 2018-05-17 ENCOUNTER — Ambulatory Visit (INDEPENDENT_AMBULATORY_CARE_PROVIDER_SITE_OTHER): Payer: BC Managed Care – PPO | Admitting: General Surgery

## 2018-05-17 ENCOUNTER — Encounter: Payer: Self-pay | Admitting: General Surgery

## 2018-05-17 VITALS — BP 159/79 | HR 95 | Temp 97.5°F | Resp 20 | Ht 63.0 in | Wt 190.0 lb

## 2018-05-17 DIAGNOSIS — K5732 Diverticulitis of large intestine without perforation or abscess without bleeding: Secondary | ICD-10-CM

## 2018-05-17 MED ORDER — CIPROFLOXACIN HCL 500 MG PO TABS: 500.0000 mg | ORAL_TABLET | Freq: Two times a day (BID) | ORAL | 0 refills | Status: DC

## 2018-05-17 MED ORDER — METRONIDAZOLE 500 MG PO TABS: 500.0000 mg | ORAL_TABLET | Freq: Three times a day (TID) | ORAL | 0 refills | Status: DC

## 2018-05-17 NOTE — Progress Notes (Signed)
Sarah [name]; [id number]; 09-Nov-1960   HPI Patient is a 57 year old black female who was referred to my care by Dr. [name] for evaluation treatment of left lower quadrant abdominal pain.  Patient is status post laparoscopic sigmoid colectomy in 2011.  Patient states that over the past few months she has had worsening intermittent left lower quadrant abdominal pain.  She also states she has been feeling bloated.  She denies any diarrhea or constipation.  She denies any fever or chills.  She states she was treated with antibiotics in the past for a single episode of diverticulitis.  She currently has a pain level of 8 out of 10.  The pain is somewhat made worse with movement. Past Medical History:  Diagnosis Date  . Anxiety   . Diabetes mellitus   . Diverticulosis   . GERD (gastroesophageal reflux disease)   . Hypercholesteremia     Past Surgical History:  Procedure Laterality Date  . ABDOMINAL HYSTERECTOMY     complete  . CARDIAC CATHETERIZATION  2011  . COLONOSCOPY  01/04/2010   left sided transverse diverticula/two diminutive rectal polyps  . COLONOSCOPY  03/28/2012   Procedure: COLONOSCOPY;  Surgeon: Daneil Dolin, MD;  Location: [facility];  Service: Endoscopy;  Laterality: N/A;  9:30  . FOOT SURGERY     right-tendon repair  . laparoscopic sigmoid colectomy  02/01/10   Dr Jola Babinski  . TRIGGER FINGER RELEASE Left 03/2015  . TRIGGER FINGER RELEASE Right 02/25/2016   Procedure: RIGHT LONG TRIGGER FINGER RELEASE;  Surgeon: Carole Civil, MD;  Location: [facility];  Service: Orthopedics;  Laterality: Right;    Family History  Problem Relation Age of Onset  . Colon cancer Maternal Grandfather   . Colon polyps Mother   . Diabetes Mother   . Hypertension Mother   . Heart attack Father   . Heart murmur Sister   . Hypertension Brother   . Diabetes Son     Current Outpatient Medications on File Prior to Visit  Medication Sig Dispense Refill  . albuterol (PROVENTIL)  (2.5 MG/3ML) 0.083% nebulizer solution Take 3 mLs (2.5 mg total) by nebulization every 4 (four) hours as needed for wheezing. 30 vial 0  . GLIPIZIDE XL 10 MG 24 hr tablet Take 10 mg by mouth 2 (two) times daily.    Marland Kitchen HYDROcodone-acetaminophen (NORCO/VICODIN) 5-325 MG tablet Take 1 tablet by mouth every 6 (six) hours as needed for severe pain. 15 tablet 0  . ibuprofen (ADVIL,MOTRIN) 600 MG tablet Take 1 tablet (600 mg total) by mouth every 6 (six) hours as needed. 30 tablet 0  . LORazepam (ATIVAN) 0.5 MG tablet Take 0.5 mg by mouth as needed for anxiety.    Marland Kitchen losartan (COZAAR) 25 MG tablet Take 25 mg by mouth daily.    . metFORMIN (GLUCOPHAGE-XR) 500 MG 24 hr tablet Take 1,000 mg by mouth 2 (two) times daily.     . predniSONE (DELTASONE) 20 MG tablet Take 2 tablets (40 mg total) by mouth daily with breakfast. For the next four days 8 tablet 0  . traMADol (ULTRAM) 50 MG tablet Take 1 tablet (50 mg total) by mouth every 6 (six) hours as needed. 15 tablet 0  . TRESIBA FLEXTOUCH 100 UNIT/ML SOPN FlexTouch Pen Inject 55 Units into the skin at bedtime.     No current facility-administered medications on file prior to visit.     Allergies  Allergen Reactions  . Sulfonamide Derivatives Itching  . Latex Itching and Rash  Powdered ONly    Social History   Substance and Sexual Activity  Alcohol Use Yes   Comment: occasional    Social History   Tobacco Use  Smoking Status Former Smoker  . Packs/day: 1.00  . Years: 10.00  . Pack years: 10.00  . Types: Cigarettes  Smokeless Tobacco Never Used    Review of Systems  Constitutional: Positive for malaise/fatigue.  HENT: Positive for sinus pain.   Eyes: Negative.   Respiratory: Negative.   Cardiovascular: Negative.   Gastrointestinal: Positive for abdominal pain, heartburn and nausea.  Genitourinary: Positive for frequency and urgency.  Musculoskeletal: Positive for joint pain.  Skin: Negative.   Neurological: Positive for headaches.   Endo/Heme/Allergies: Negative.   Psychiatric/Behavioral: Negative.     Objective   Vitals:   05/17/18 1147  BP: (!) 159/79  Pulse: 95  Resp: 20  Temp: (!) 97.5 F (36.4 C)    Physical Exam  Constitutional: She is oriented to person, place, and time. She appears well-developed and well-nourished. No distress.  HENT:  Head: Normocephalic and atraumatic.  Cardiovascular: Normal rate, regular rhythm and normal heart sounds. Exam reveals no gallop.  No murmur heard. Pulmonary/Chest: Effort normal and breath sounds normal. No stridor. No respiratory distress. She has no wheezes. She has no rales.  Abdominal: Soft. Bowel sounds are normal. She exhibits no distension and no mass. There is tenderness. There is no rebound and no guarding. A hernia is present.  With a small asymptomatic umbilical hernia is present.  She does have discomfort in the left lower quadrant to deep palpation.  Her Pfannenstiel incision site has healed well without tenderness, hernia.  No rigidity is noted.  Neurological: She is alert and oriented to person, place, and time.  Skin: Skin is warm and dry.  Vitals reviewed.  CT scan images personally reviewed Assessment  Left lower quadrant abdominal pain, most likely secondary to diverticulitis, though the CT scan was not too remarkable other than diverticular disease still present.  The umbilical hernia is asymptomatic. Plan   We will treat her with ciprofloxacin and Flagyl for 2 weeks.  We will see her back in 3 weeks for follow-up.  I told her that we would reassess her situation at that time.  She understands and agrees.

## 2018-05-24 ENCOUNTER — Ambulatory Visit: Payer: BC Managed Care – PPO | Admitting: General Surgery

## 2018-06-05 ENCOUNTER — Encounter: Payer: Self-pay | Admitting: General Surgery

## 2018-06-05 ENCOUNTER — Ambulatory Visit: Payer: BC Managed Care – PPO | Admitting: General Surgery

## 2018-06-05 VITALS — BP 159/82 | HR 91 | Temp 98.2°F | Resp 18 | Wt 190.6 lb

## 2018-06-05 DIAGNOSIS — K5732 Diverticulitis of large intestine without perforation or abscess without bleeding: Secondary | ICD-10-CM

## 2018-06-06 NOTE — Progress Notes (Signed)
Subjective:     Sarah [name]  Here for follow-up of abdominal pain.  Patient states that her left lower quadrant abdominal pain seems to be decreased.  She states her antibiotics do give her an upset stomach.  She has 1 more day left of taking antibiotics.  She denies any fever or chills.  She denies any diarrhea. Objective:    BP (!) 159/82 (BP Location: Left Arm, Patient Position: Sitting, Cuff Size: Normal)   Pulse 91   Temp 98.2 F (36.8 C) (Temporal)   Resp 18   Wt 190 lb 9.6 oz (86.5 kg)   BMI 33.76 kg/m   General:  alert, cooperative and no distress  Head is normocephalic, atraumatic Lungs clear to auscultation with equal breath sounds bilaterally Heart examination reveals regular rate and rhythm without S3, S4, murmurs Abdomen is soft with some tenderness to deep palpation in the left lower quadrant.  No rigidity is noted.  She does have bowel sounds.     Assessment:    Left lower quadrant abdominal pain most likely secondary to diverticulitis, though the CAT scan was not too impressive. She does seem to be getting slowly better.   Plan:   No need for surgical intervention at this time.  Patient may stop her antibiotics.  We will follow-up in the office in 2 weeks.

## 2018-06-19 ENCOUNTER — Encounter: Payer: Self-pay | Admitting: General Surgery

## 2018-06-19 ENCOUNTER — Ambulatory Visit: Payer: BC Managed Care – PPO | Admitting: General Surgery

## 2018-06-19 VITALS — BP 144/86 | HR 99 | Temp 97.5°F | Resp 18 | Wt 193.2 lb

## 2018-06-19 DIAGNOSIS — K5732 Diverticulitis of large intestine without perforation or abscess without bleeding: Secondary | ICD-10-CM

## 2018-06-19 NOTE — Patient Instructions (Signed)
Colonoscopy, Adult A colonoscopy is an exam to look at the entire large intestine. During the exam, a lubricated, bendable tube is inserted into the anus and then passed into the rectum, colon, and other parts of the large intestine. A colonoscopy is often done as a part of normal colorectal screening or in response to certain symptoms, such as anemia, persistent diarrhea, abdominal pain, and blood in the stool. The exam can help screen for and diagnose medical problems, including:  Tumors.  Polyps.  Inflammation.  Areas of bleeding.  Tell a health care provider about:  Any allergies you have.  All medicines you are taking, including vitamins, herbs, eye drops, creams, and over-the-counter medicines.  Any problems you or family members have had with anesthetic medicines.  Any blood disorders you have.  Any surgeries you have had.  Any medical conditions you have.  Any problems you have had passing stool. What are the risks? Generally, this is a safe procedure. However, problems may occur, including:  Bleeding.  A tear in the intestine.  A reaction to medicines given during the exam.  Infection (rare).  What happens before the procedure? Eating and drinking restrictions Follow instructions from your health care provider about eating and drinking, which may include:  A few days before the procedure - follow a low-fiber diet. Avoid nuts, seeds, dried fruit, raw fruits, and vegetables.  1-3 days before the procedure - follow a clear liquid diet. Drink only clear liquids, such as clear broth or bouillon, black coffee or tea, clear juice, clear soft drinks or sports drinks, gelatin dessert, and popsicles. Avoid any liquids that contain red or purple dye.  On the day of the procedure - do not eat or drink anything during the 2 hours before the procedure, or within the time period that your health care provider recommends.  Bowel prep If you were prescribed an oral bowel prep  to clean out your colon:  Take it as told by your health care provider. Starting the day before your procedure, you will need to drink a large amount of medicated liquid. The liquid will cause you to have multiple loose stools until your stool is almost clear or light green.  If your skin or anus gets irritated from diarrhea, you may use these to relieve the irritation: ? Medicated wipes, such as adult wet wipes with aloe and vitamin E. ? A skin soothing-product like petroleum jelly.  If you vomit while drinking the bowel prep, take a break for up to 60 minutes and then begin the bowel prep again. If vomiting continues and you cannot take the bowel prep without vomiting, call your health care provider.  General instructions  Ask your health care provider about changing or stopping your regular medicines. This is especially important if you are taking diabetes medicines or blood thinners.  Plan to have someone take you home from the hospital or clinic. What happens during the procedure?  An IV tube may be inserted into one of your veins.  You will be given medicine to help you relax (sedative).  To reduce your risk of infection: ? Your health care team will wash or sanitize their hands. ? Your anal area will be washed with soap.  You will be asked to lie on your side with your knees bent.  Your health care provider will lubricate a long, thin, flexible tube. The tube will have a camera and a light on the end.  The tube will be inserted into your   anus.  The tube will be gently eased through your rectum and colon.  Air will be delivered into your colon to keep it open. You may feel some pressure or cramping.  The camera will be used to take images during the procedure.  A small tissue sample may be removed from your body to be examined under a microscope (biopsy). If any potential problems are found, the tissue will be sent to a lab for testing.  If small polyps are found, your  health care provider may remove them and have them checked for cancer cells.  The tube that was inserted into your anus will be slowly removed. The procedure may vary among health care providers and hospitals. What happens after the procedure?  Your blood pressure, heart rate, breathing rate, and blood oxygen level will be monitored until the medicines you were given have worn off.  Do not drive for 24 hours after the exam.  You may have a small amount of blood in your stool.  You may pass gas and have mild abdominal cramping or bloating due to the air that was used to inflate your colon during the exam.  It is up to you to get the results of your procedure. Ask your health care provider, or the department performing the procedure, when your results will be ready. This information is not intended to replace advice given to you by your health care provider. Make sure you discuss any questions you have with your health care provider. Document Released: 08/05/2000 Document Revised: 06/08/2016 Document Reviewed: 10/20/2015 Elsevier Interactive Patient Education  2018 [employer] Inc[employer]  

## 2018-06-19 NOTE — Progress Notes (Signed)
Subjective:     [name]  Here for follow-up of diverticulitis.  Patient states the pain is minimal.  She definitely feels better than she did when I last saw her.  No fever or chills have been noted.  Bowel movements have been regular. Objective:    BP (!) 144/86 (BP Location: Left Arm, Patient Position: Sitting, Cuff Size: Normal)   Pulse 99   Temp (!) 97.5 F (36.4 C) (Temporal)   Resp 18   Wt 193 lb 3.2 oz (87.6 kg)   BMI 34.22 kg/m   General:  alert, cooperative and no distress  Abdomen soft, nontender, nondistended.     Assessment:    Sigmoid diverticulitis, resolving.    Plan:   Patient may return to work without restrictions on 06/25/2018.  Will do a screening colonoscopy in December of this year as the patient has not had a colonoscopy since her surgery in 2011.

## 2018-06-27 DIAGNOSIS — I10 Essential (primary) hypertension: Secondary | ICD-10-CM | POA: Insufficient documentation

## 2018-06-27 DIAGNOSIS — Z87898 Personal history of other specified conditions: Secondary | ICD-10-CM | POA: Insufficient documentation

## 2018-06-27 NOTE — Progress Notes (Signed)
Cardiology Office Note    Date:  07/02/2018   ID:  Sarah [name], DOB 12-03-1960, MRN [id number]  PCP:  Celene Squibb, MD  Cardiologist: Kate Sable, MD EPS: None  No chief complaint on file.   History of Present Illness:  Sarah [name] is a 57 y.o. female who saw Dr. Bronson Ing   for chest pain 04/10/2018.  ER visit with negative troponins, EKG with T wave inversion inferiorly and V3 through V6 no changes since 2012.  Apparently had a cardiac catheterization in 2011 reportedly normal but unable to locate.  Father died of an MI at age 46.  Also has hypertension, HLD, DM type II.  Nuclear stress test 04/24/2018 hypertensive response to exercise with small mild intensity apical defect partially reversible most likely reflective of breast attenuation.  Low risk study LVEF 74%.  Patient comes in for f/u. Has occasional flutter in chest that takes her breath away.Last 5-10 min. Can cough and get it to stop. Occasional dizziness with it. No chest pain. Walks 15 min/day at work. Doesn't notice with exertion. Doesn't drink caffeine. No decongestants.    Past Medical History:  Diagnosis Date  . Anxiety   . Diabetes mellitus   . Diverticulosis   . GERD (gastroesophageal reflux disease)   . Hypercholesteremia     Past Surgical History:  Procedure Laterality Date  . ABDOMINAL HYSTERECTOMY     complete  . CARDIAC CATHETERIZATION  2011  . COLONOSCOPY  01/04/2010   left sided transverse diverticula/two diminutive rectal polyps  . COLONOSCOPY  03/28/2012   Procedure: COLONOSCOPY;  Surgeon: Daneil Dolin, MD;  Location: [facility];  Service: Endoscopy;  Laterality: N/A;  9:30  . FOOT SURGERY     right-tendon repair  . laparoscopic sigmoid colectomy  02/01/10   Dr Jola Babinski  . TRIGGER FINGER RELEASE Left 03/2015  . TRIGGER FINGER RELEASE Right 02/25/2016   Procedure: RIGHT LONG TRIGGER FINGER RELEASE;  Surgeon: Carole Civil, MD;  Location: [facility];  Service:  Orthopedics;  Laterality: Right;    Current Medications: Current Meds  Medication Sig  . albuterol (PROVENTIL) (2.5 MG/3ML) 0.083% nebulizer solution Take 3 mLs (2.5 mg total) by nebulization every 4 (four) hours as needed for wheezing.  Marland Kitchen GLIPIZIDE XL 10 MG 24 hr tablet Take 10 mg by mouth 2 (two) times daily.  Marland Kitchen HYDROcodone-acetaminophen (NORCO/VICODIN) 5-325 MG tablet Take 1 tablet by mouth every 6 (six) hours as needed for severe pain.  Marland Kitchen ibuprofen (ADVIL,MOTRIN) 600 MG tablet Take 1 tablet (600 mg total) by mouth every 6 (six) hours as needed.  Marland Kitchen LORazepam (ATIVAN) 0.5 MG tablet Take 0.5 mg by mouth as needed for anxiety.  Marland Kitchen losartan (COZAAR) 25 MG tablet Take 25 mg by mouth daily.  . metFORMIN (GLUCOPHAGE-XR) 500 MG 24 hr tablet Take 1,000 mg by mouth 2 (two) times daily.   . predniSONE (DELTASONE) 20 MG tablet Take 2 tablets (40 mg total) by mouth daily with breakfast. For the next four days  . traMADol (ULTRAM) 50 MG tablet Take 1 tablet (50 mg total) by mouth every 6 (six) hours as needed.  Tyler Aas FLEXTOUCH 100 UNIT/ML SOPN FlexTouch Pen Inject 55 Units into the skin at bedtime.     Allergies:   Sulfonamide derivatives and Latex   Social History   Socioeconomic History  . Marital status: Married    Spouse name: Not on file  . Number of children: 1  . Years of education: Not  on file  . Highest education level: Not on file  Occupational History  . Occupation: K Pharmacist, hospital asst    Employer: [employer]  Social Needs  . Financial resource strain: Not on file  . Food insecurity:    Worry: Not on file    Inability: Not on file  . Transportation needs:    Medical: Not on file    Non-medical: Not on file  Tobacco Use  . Smoking status: Former Smoker    Packs/day: 1.00    Years: 10.00    Pack years: 10.00    Types: Cigarettes  . Smokeless tobacco: Never Used  Substance and Sexual Activity  . Alcohol use: Yes    Comment: occasional  . Drug use: No  . Sexual  activity: Yes    Birth control/protection: Surgical    Comment: hyst  Lifestyle  . Physical activity:    Days per week: Not on file    Minutes per session: Not on file  . Stress: Not on file  Relationships  . Social connections:    Talks on phone: Not on file    Gets together: Not on file    Attends religious service: Not on file    Active member of club or organization: Not on file    Attends meetings of clubs or organizations: Not on file    Relationship status: Not on file  Other Topics Concern  . Not on file  Social History Narrative   Lives w/ husband, sister & her children , teen mom & her baby     Family History:  The patient's family history includes Colon cancer in her maternal grandfather; Colon polyps in her mother; Diabetes in her mother and son; Heart attack in her father; Heart murmur in her sister; Hypertension in her brother and mother.   ROS:   Please see the history of present illness.    Review of Systems  Constitution: Negative.  HENT: Negative.   Eyes: Negative.   Cardiovascular: Positive for palpitations.  Respiratory: Negative.   Hematologic/Lymphatic: Negative.   Musculoskeletal: Negative.  Negative for joint pain.  Gastrointestinal: Negative.   Genitourinary: Negative.   Neurological: Positive for light-headedness.   All other systems reviewed and are negative.   PHYSICAL EXAM:   VS:  BP 118/68   Pulse 98   Ht _0  (1.6 m)   Wt 194 lb (88 kg)   SpO2 96%   BMI 34.37 kg/m   Physical Exam  GEN: Well nourished, well developed, in no acute distress  Neck: no JVD, carotid bruits, or masses Cardiac:RRR; 2/6 systolic murmur at the left sternal border respiratory:  clear to auscultation bilaterally, normal work of breathing GI: soft, nontender, nondistended, + BS Ext: without cyanosis, clubbing, or edema, Good distal pulses bilaterally Neuro:  Alert and Oriented x 3 Psych: euthymic mood, full affect  Wt Readings from Last 3 Encounters:    07/02/18 194 lb (88 kg)  06/19/18 193 lb 3.2 oz (87.6 kg)  06/05/18 190 lb 9.6 oz (86.5 kg)      Studies/Labs Reviewed:   EKG:  EKG is not ordered today.  Recent Labs: 09/05/2017: Magnesium 1.8 02/26/2018: BUN 12; Creatinine, Ser 0.74; Hemoglobin 13.0; Platelets 268; Potassium 3.7; Sodium 138   Lipid Panel    Component Value Date/Time   CHOL  12/11/2010 0423    172        ATP III CLASSIFICATION:  <200     mg/dL   Desirable  200-239  mg/dL   Borderline High  >=240    mg/dL   High          TRIG 137 12/11/2010 0423   HDL 19 (L) 12/11/2010 0423   CHOLHDL 9.1 12/11/2010 0423   VLDL 27 12/11/2010 0423   LDLCALC (H) 12/11/2010 0423    126        Total Cholesterol/HDL:CHD Risk Coronary Heart Disease Risk Table                     Men   Women  1/2 Average Risk   3.4   3.3  Average Risk       5.0   4.4  2 X Average Risk   9.6   7.1  3 X Average Risk  23.4   11.0        Use the calculated Patient Ratio above and the CHD Risk Table to determine the patient's CHD Risk.        ATP III CLASSIFICATION (LDL):  <100     mg/dL   Optimal  100-129  mg/dL   Near or Above                    Optimal  130-159  mg/dL   Borderline  160-189  mg/dL   High  >190     mg/dL   Very High    Additional studies/ records that were reviewed today include:  Nuclear stress test 04/24/2018  No diagnostic ST segment changes to indicate ischemia. Hypertensive blood pressure response. No chest pain was reported. There were no arrhythmias. Low risk Duke treadmill score of 5.5. Blood pressure demonstrated a hypertensive response to exercise. Small, mild intensity, apical anterior defect that is partially reversible, most likely reflective of variable breast attenuation. Increased TID ratio 1.27 looks to be due to misregistration of images. This is a low risk study. Nuclear stress EF: 74%.   ASSESSMENT:    1. History of chest pain   2. Essential hypertension   3. Hyperlipidemia, unspecified  hyperlipidemia type   4. History of palpitations in adulthood   5. Murmur, cardiac      PLAN:  In order of problems listed above:  History of chest pain with abnormal EKG.  Low risk nuclear stress test 04/2018.  Small defect suspect secondary to breast attenuation  Essential hypertension blood pressure controlled on losartan  Hyperlipidemia no recent lipid profile on file.  Should have follow-up lipid panel in the future.  Palpitations sometimes associated with some dizziness.  Will place a 30-day monitor to rule out any arrhythmias.  Check be met, magnesium and TSH.  Follow-up with me after testing complete.  Systolic murmur check 2D echo to assess.    Medication Adjustments/Labs and Tests Ordered: Current medicines are reviewed at length with the patient today.  Concerns regarding medicines are outlined above.  Medication changes, Labs and Tests ordered today are listed in the Patient Instructions below. There are no Patient Instructions on file for this visit.   Sumner Boast, PA-C  07/02/2018 11:55 AM    [facility] [place], [place], [place]  [address] Phone: [phone]; Fax: [phone]

## 2018-07-02 ENCOUNTER — Ambulatory Visit (INDEPENDENT_AMBULATORY_CARE_PROVIDER_SITE_OTHER): Payer: BC Managed Care – PPO | Admitting: Physician Assistant

## 2018-07-02 ENCOUNTER — Other Ambulatory Visit (HOSPITAL_COMMUNITY)
Admission: RE | Admit: 2018-07-02 | Discharge: 2018-07-02 | Disposition: A | Discharge status: Home / Self Care | Payer: BC Managed Care – PPO | Source: Ambulatory Visit | Attending: Physician Assistant | Admitting: Physician Assistant

## 2018-07-02 ENCOUNTER — Encounter: Payer: Self-pay | Admitting: Physician Assistant

## 2018-07-02 VITALS — BP 118/68 | HR 98 | Ht 63.0 in | Wt 194.0 lb

## 2018-07-02 DIAGNOSIS — E785 Hyperlipidemia, unspecified: Secondary | ICD-10-CM

## 2018-07-02 DIAGNOSIS — I10 Essential (primary) hypertension: Secondary | ICD-10-CM | POA: Diagnosis not present

## 2018-07-02 DIAGNOSIS — Z8679 Personal history of other diseases of the circulatory system: Secondary | ICD-10-CM

## 2018-07-02 DIAGNOSIS — R011 Cardiac murmur, unspecified: Secondary | ICD-10-CM

## 2018-07-02 DIAGNOSIS — Z87898 Personal history of other specified conditions: Secondary | ICD-10-CM

## 2018-07-02 LAB — TSH: TSH: 1.387 u[IU]/mL (ref 0.350–4.500)

## 2018-07-02 LAB — BASIC METABOLIC PANEL
ANION GAP: 8 (ref 5–15)
BUN: 14 mg/dL (ref 6–20)
CALCIUM: 8.8 mg/dL — AB (ref 8.9–10.3)
CO2: 24 mmol/L (ref 22–32)
Chloride: 103 mmol/L (ref 98–111)
Creatinine, Ser: 0.71 mg/dL (ref 0.44–1.00)
GFR calc non Af Amer: >60 mL/min (ref >60)
Glucose, Bld: 453 mg/dL — ABNORMAL HIGH (ref 70–99)
POTASSIUM: 4.2 mmol/L (ref 3.5–5.1)
Sodium: 135 mmol/L (ref 135–145)

## 2018-07-02 LAB — MAGNESIUM: Magnesium: 1.7 mg/dL (ref 1.7–2.4)

## 2018-07-02 NOTE — Patient Instructions (Signed)
Medication Instructions:  Your physician recommends that you continue on your current medications as directed. Please refer to the Current Medication list given to you today.  If you need a refill on your cardiac medications before your next appointment, please call your pharmacy.   Lab work: Your physician recommends that you return for lab work in: Today   If you have labs (blood work) drawn today and your tests are completely normal, you will receive your results only by: Marland Kitchen MyChart Message (if you have MyChart) OR . A paper copy in the mail If you have any lab test that is abnormal or we need to change your treatment, we will call you to review the results.  Testing/Procedures: Your physician has requested that you have an echocardiogram. Echocardiography is a painless test that uses sound waves to create images of your heart. It provides your doctor with information about the size and shape of your heart and how well your heart's chambers and valves are working. This procedure takes approximately one hour. There are no restrictions for this procedure.  Your physician has recommended that you wear an event monitor. Event monitors are medical devices that record the heart's electrical activity. Doctors most often [place] these monitors to diagnose arrhythmias. Arrhythmias are problems with the speed or rhythm of the heartbeat. The monitor is a small, portable device. You can wear one while you do your normal daily activities. This is usually used to diagnose what is causing palpitations/syncope (passing out).    Follow-Up: At [facility], you and your health needs are our priority.  As part of our continuing mission to provide you with exceptional heart care, we have created designated Provider Care Teams.  These Care Teams include your primary Cardiologist (physician) and Advanced Practice Providers (APPs -  Physician Assistants and Nurse Practitioners) who all work together to provide you with  the care you need, when you need it. You will need a follow up appointment in 5 weeks.  Please call our office 2 months in advance to schedule this appointment.  You may see Kate Sable, MD or one of the following Advanced Practice Providers on your designated Care Team:   Bernerd Pho, PA-C [facility]) . Ermalinda Barrios, PA-C ([facility])  Any Other Special Instructions Will Be Listed Below (If Applicable). Thank you for choosing [facility]!

## 2018-07-06 ENCOUNTER — Ambulatory Visit (INDEPENDENT_AMBULATORY_CARE_PROVIDER_SITE_OTHER): Payer: BC Managed Care – PPO

## 2018-07-06 DIAGNOSIS — R002 Palpitations: Secondary | ICD-10-CM

## 2018-07-06 DIAGNOSIS — Z8679 Personal history of other diseases of the circulatory system: Secondary | ICD-10-CM | POA: Diagnosis not present

## 2018-07-18 ENCOUNTER — Ambulatory Visit (HOSPITAL_COMMUNITY): Admission: RE | Admit: 2018-07-18 | Payer: BC Managed Care – PPO | Source: Ambulatory Visit

## 2018-07-23 ENCOUNTER — Telehealth: Payer: Self-pay | Admitting: Physician Assistant

## 2018-07-23 NOTE — Telephone Encounter (Signed)
[employer] called because pt had 8 sec run VT.  They contacted pt, she was asleep, asymptomatic.  Strips faxed to office.  Gerrianne Scale, PAC and Dr Bronson Ing notified.  Rosaria Ferries, PA-C 07/23/2018 6:31 AM [facility] [phone]

## 2018-07-30 ENCOUNTER — Telehealth: Payer: Self-pay | Admitting: *Deleted

## 2018-07-30 ENCOUNTER — Ambulatory Visit (HOSPITAL_COMMUNITY)
Admission: RE | Admit: 2018-07-30 | Discharge: 2018-07-30 | Disposition: A | Discharge status: Home / Self Care | Payer: BC Managed Care – PPO | Source: Ambulatory Visit | Attending: Physician Assistant | Admitting: Physician Assistant

## 2018-07-30 DIAGNOSIS — I340 Nonrheumatic mitral (valve) insufficiency: Secondary | ICD-10-CM

## 2018-07-30 DIAGNOSIS — R011 Cardiac murmur, unspecified: Secondary | ICD-10-CM | POA: Diagnosis not present

## 2018-07-30 NOTE — Telephone Encounter (Signed)
-----   Message from Imogene Burn, [place] sent at 07/30/2018  3:01 PM EST ----- Heart function normal, heart has a little trouble relaxing and only mild murmur.  No changes.

## 2018-07-30 NOTE — Telephone Encounter (Signed)
Called patient with test results. No answer. Left message to call back.  

## 2018-07-30 NOTE — Progress Notes (Signed)
*  PRELIMINARY RESULTS* Echocardiogram 2D Echocardiogram has been performed.  Sarah [name] 07/30/2018, 10:26 AM

## 2018-08-01 ENCOUNTER — Encounter: Payer: Self-pay | Admitting: *Deleted

## 2018-08-01 ENCOUNTER — Telehealth: Payer: Self-pay | Admitting: *Deleted

## 2018-08-01 NOTE — Telephone Encounter (Signed)
Pt notified that she has been cleared for colonoscopy and that she can pick up letter in office.

## 2018-08-01 NOTE — Telephone Encounter (Signed)
-----   Message from Imogene Burn, PA-C sent at 07/31/2018  7:50 AM EST ----- Regarding: RE: colonoscopy  Yes you can send a letter stating that she is cleared for colonoscopy.  Thanks ----- Message ----- From: Levonne Hubert, LPN Sent: 27/08/2927   5:03 PM EST To: Imogene Burn, PA-C Subject: colonoscopy                                    Pt would like to know if a letter can be sent stating that she has been cleared for her colonoscopy?

## 2018-08-01 NOTE — Telephone Encounter (Signed)
Called pt. No answer. Left msg to call back.  

## 2018-08-08 ENCOUNTER — Telehealth: Payer: Self-pay | Admitting: *Deleted

## 2018-08-08 DIAGNOSIS — I10 Essential (primary) hypertension: Secondary | ICD-10-CM

## 2018-08-08 MED ORDER — METOPROLOL TARTRATE 25 MG PO TABS: 25.0000 mg | ORAL_TABLET | Freq: Two times a day (BID) | ORAL | 3 refills | Status: DC

## 2018-08-08 NOTE — Telephone Encounter (Signed)
-----   Message from Imogene Burn, PA-C sent at 08/08/2018  3:20 PM EST ----- Discussed with Dr. Bronson Ing. Patient had 21 beat run of ventricular tachycardia. Start Lopressor 25 mg BID check Mg and BMET today or tomorrow. Cancel Colonoscopy for now until we get the beta blocker on board. (we just gave her a letter to clear her). Needs to come back in for reassessment in 2 weeks. The fact that she had low risk stress test and normal LV function on echo is reassuring.

## 2018-08-09 ENCOUNTER — Telehealth: Payer: Self-pay

## 2018-08-09 ENCOUNTER — Telehealth: Payer: Self-pay | Admitting: *Deleted

## 2018-08-09 ENCOUNTER — Other Ambulatory Visit (HOSPITAL_COMMUNITY)
Admission: RE | Admit: 2018-08-09 | Discharge: 2018-08-09 | Disposition: A | Discharge status: Home / Self Care | Payer: BC Managed Care – PPO | Source: Ambulatory Visit | Attending: Physician Assistant | Admitting: Physician Assistant

## 2018-08-09 DIAGNOSIS — I10 Essential (primary) hypertension: Secondary | ICD-10-CM | POA: Diagnosis not present

## 2018-08-09 LAB — BASIC METABOLIC PANEL
ANION GAP: 8 (ref 5–15)
BUN: 12 mg/dL (ref 6–20)
CO2: 25 mmol/L (ref 22–32)
Calcium: 8.9 mg/dL (ref 8.9–10.3)
Chloride: 101 mmol/L (ref 98–111)
Creatinine, Ser: 0.54 mg/dL (ref 0.44–1.00)
Glucose, Bld: 331 mg/dL — ABNORMAL HIGH (ref 70–99)
POTASSIUM: 4 mmol/L (ref 3.5–5.1)
Sodium: 134 mmol/L — ABNORMAL LOW (ref 135–145)

## 2018-08-09 LAB — MAGNESIUM: MAGNESIUM: 1.7 mg/dL (ref 1.7–2.4)

## 2018-08-09 MED ORDER — MAGNESIUM OXIDE -MG SUPPLEMENT 400 (240 MG) MG PO TABS: ORAL_TABLET | ORAL | 11 refills | Status: DC

## 2018-08-09 NOTE — Telephone Encounter (Signed)
Called pt., no answer. Left message for pt to return call.  

## 2018-08-09 NOTE — Telephone Encounter (Signed)
-----   Message from Imogene Burn, PA-C sent at 08/09/2018 11:50 AM EST ----- Potassium normal. Magnesium 1.7 low normal. Begin Magnesium Oxide 400 mg BID for 2 days then once daily.

## 2018-08-13 ENCOUNTER — Ambulatory Visit: Payer: BC Managed Care – PPO | Admitting: Cardiology

## 2018-09-05 ENCOUNTER — Encounter: Payer: Self-pay | Admitting: Student

## 2018-09-05 ENCOUNTER — Ambulatory Visit (INDEPENDENT_AMBULATORY_CARE_PROVIDER_SITE_OTHER): Payer: BC Managed Care – PPO | Admitting: Student

## 2018-09-05 VITALS — BP 122/70 | HR 72 | Ht 62.0 in | Wt 192.0 lb

## 2018-09-05 DIAGNOSIS — I340 Nonrheumatic mitral (valve) insufficiency: Secondary | ICD-10-CM | POA: Diagnosis not present

## 2018-09-05 DIAGNOSIS — I10 Essential (primary) hypertension: Secondary | ICD-10-CM | POA: Diagnosis not present

## 2018-09-05 DIAGNOSIS — I472 Ventricular tachycardia: Secondary | ICD-10-CM | POA: Diagnosis not present

## 2018-09-05 DIAGNOSIS — E119 Type 2 diabetes mellitus without complications: Secondary | ICD-10-CM

## 2018-09-05 DIAGNOSIS — Z794 Long term (current) use of insulin: Secondary | ICD-10-CM

## 2018-09-05 DIAGNOSIS — R002 Palpitations: Secondary | ICD-10-CM

## 2018-09-05 DIAGNOSIS — R000 Tachycardia, unspecified: Secondary | ICD-10-CM

## 2018-09-05 MED ORDER — INSULIN GLARGINE-LIXISENATIDE 100-33 UNT-MCG/ML ~~LOC~~ SOPN: 33.0000 [IU]/d | PEN_INJECTOR | Freq: Every day | SUBCUTANEOUS | Status: DC

## 2018-09-05 MED ORDER — LOSARTAN POTASSIUM 25 MG PO TABS: 25.0000 mg | ORAL_TABLET | Freq: Every day | ORAL | 3 refills | Status: DC

## 2018-09-05 MED ORDER — METOPROLOL TARTRATE 25 MG PO TABS: 25.0000 mg | ORAL_TABLET | Freq: Two times a day (BID) | ORAL | 3 refills | Status: DC

## 2018-09-05 NOTE — Progress Notes (Signed)
Cardiology Office Note    Date:  09/05/2018   ID:  Sarah [name], DOB [birth year], MRN [id number]  PCP:  Celene Squibb, MD  Cardiologist: Kate Sable, MD    Chief Complaint  Patient presents with  . Follow-up    5 week visit    History of Present Illness:    Sarah [name] is a 58 y.o. female with past medical history of HTN, IDDM, normal cors by catheterization in 2011 and family history of CAD who presents to the office today for 5-week follow-up.  She was last examined by Ermalinda Barrios, PA-C on 07/02/2018 and reported occasional palpitations which would last for 5 to 10 minutes but denied any associated dyspnea or chest discomfort when this would occur. Recent NST had shown a small apical anterior defect which was partially reversible but felt to be mostly reflective of variable breast attenuation but was noted to have an increased TID ratio of 1.27 which was thought to be due to misregistration of the images. It was overall read as a low risk study. Echocardiogram was obtained and showed mild LVH with a preserved EF of 60 to 62%, grade 2 diastolic dysfunction, and no regional wall motion abnormalities. Was noted to have mild mitral regurgitation.  Her event monitor resulted on 08/08/2018 and showed a 21 beat run of VT and she was started on Lopressor 25 mg twice daily at that time. Potassium was normal at 4.0 but she was found to have hypomagnesemia with magnesium at 1.7 and was started on supplementation.   In talking with the patient today, she reports significant improvement in her palpitations since being started on Lopressor. She was overall unaware of her episode of tachycardia while wearing her event monitor as she was asleep when this occurred. 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 No recent orthopnea, PND, or lower extremity edema.  She initially experienced fatigue  when starting Lopressor but says this has now improved. Denies any noted side effects.  She is curious when she can proceed with colonoscopy as she reports having known diverticulitis which is followed by GI and Dr. Arnoldo Morale.   Past Medical History:  Diagnosis Date  . Anxiety   . Diabetes mellitus   . Diverticulosis   . GERD (gastroesophageal reflux disease)   . Hypercholesteremia     Past Surgical History:  Procedure Laterality Date  . ABDOMINAL HYSTERECTOMY     complete  . CARDIAC CATHETERIZATION  2011  . COLONOSCOPY  01/04/2010   left sided transverse diverticula/two diminutive rectal polyps  . COLONOSCOPY  03/28/2012   Procedure: COLONOSCOPY;  Surgeon: Daneil Dolin, MD;  Location: [facility];  Service: Endoscopy;  Laterality: N/A;  9:30  . FOOT SURGERY     right-tendon repair  . laparoscopic sigmoid colectomy  02/01/10   Dr Jola Babinski  . TRIGGER FINGER RELEASE Left 03/2015  . TRIGGER FINGER RELEASE Right 02/25/2016   Procedure: RIGHT LONG TRIGGER FINGER RELEASE;  Surgeon: Carole Civil, MD;  Location: [facility];  Service: Orthopedics;  Laterality: Right;    Current Medications: Outpatient Medications Prior to Visit  Medication Sig Dispense Refill  . albuterol (PROVENTIL) (2.5 MG/3ML) 0.083% nebulizer solution Take 3 mLs (2.5 mg total) by nebulization every 4 (four) hours as needed for wheezing. 30 vial 0  . ibuprofen (ADVIL,MOTRIN) 600 MG tablet Take 1 tablet (600 mg total) by  mouth every 6 (six) hours as needed. 30 tablet 0  . Magnesium Oxide 400 (240 Mg) MG TABS Take 400 mg ( 1 Tablet) Two Times Daily for 2 Days Then Take 400 mg (1 Tablet)  Daily 32 tablet 11  . metFORMIN (GLUCOPHAGE-XR) 500 MG 24 hr tablet Take 1,000 mg by mouth 2 (two) times daily.     Marland Kitchen GLIPIZIDE XL 10 MG 24 hr tablet Take 10 mg by mouth 2 (two) times daily.    Marland Kitchen HYDROcodone-acetaminophen (NORCO/VICODIN) 5-325 MG tablet Take 1 tablet by mouth every 6 (six) hours as needed for severe pain.  15 tablet 0  . LORazepam (ATIVAN) 0.5 MG tablet Take 0.5 mg by mouth as needed for anxiety.    Marland Kitchen losartan (COZAAR) 25 MG tablet Take 25 mg by mouth daily.    . metoprolol tartrate (LOPRESSOR) 25 MG tablet Take 1 tablet (25 mg total) by mouth 2 (two) times daily. 180 tablet 3  . predniSONE (DELTASONE) 20 MG tablet Take 2 tablets (40 mg total) by mouth daily with breakfast. For the next four days 8 tablet 0  . traMADol (ULTRAM) 50 MG tablet Take 1 tablet (50 mg total) by mouth every 6 (six) hours as needed. 15 tablet 0  . TRESIBA FLEXTOUCH 100 UNIT/ML SOPN FlexTouch Pen Inject 55 Units into the skin at bedtime.     No facility-administered medications prior to visit.      Allergies:   Sulfonamide derivatives and Latex   Social History   Socioeconomic History  . Marital status: Married    Spouse name: Not on file  . Number of children: 1  . Years of education: Not on file  . Highest education level: Not on file  Occupational History  . Occupation: K Pharmacist, hospital asst    Employer: [employer]  Social Needs  . Financial resource strain: Not on file  . Food insecurity:    Worry: Not on file    Inability: Not on file  . Transportation needs:    Medical: Not on file    Non-medical: Not on file  Tobacco Use  . Smoking status: Former Smoker    Packs/day: 1.00    Years: 10.00    Pack years: 10.00    Types: Cigarettes    Last attempt to quit: 09/05/2006    Years since quitting: 12.0  . Smokeless tobacco: Never Used  Substance and Sexual Activity  . Alcohol use: Yes    Comment: occasional  . Drug use: No  . Sexual activity: Yes    Birth control/protection: Surgical    Comment: hyst  Lifestyle  . Physical activity:    Days per week: Not on file    Minutes per session: Not on file  . Stress: Not on file  Relationships  . Social connections:    Talks on phone: Not on file    Gets together: Not on file    Attends religious service: Not on file    Active member of club or  organization: Not on file    Attends meetings of clubs or organizations: Not on file    Relationship status: Not on file  Other Topics Concern  . Not on file  Social History Narrative   Lives w/ husband, sister & her children , teen mom & her baby     Family History:  The patient's family history includes Colon cancer in her maternal grandfather; Colon polyps in her mother; Diabetes in her mother and son; Heart attack  in her father; Heart murmur in her sister; Hypertension in her brother and mother.   Review of Systems:   Please see the history of present illness.     General:  No chills, fever, night sweats or weight changes.  Cardiovascular:  No chest pain, dyspnea on exertion, edema, orthopnea, paroxysmal nocturnal dyspnea. Positive for palpitations (improved).  Dermatological: No rash, lesions/masses Respiratory: No cough, dyspnea Urologic: No hematuria, dysuria Abdominal:   No nausea, vomiting, diarrhea, bright red blood per rectum, melena, or hematemesis Neurologic:  No visual changes, wkns, changes in mental status. All other systems reviewed and are otherwise negative except as noted above.   Physical Exam:    VS:  BP 122/70 (BP Location: Right Arm)   Pulse 72   Ht 5\' 2"  (1.575 m)   Wt 192 lb (87.1 kg)   SpO2 98%   BMI 35.12 kg/m    General: Well developed, well nourished [place] American female appearing in no acute distress. Head: Normocephalic, atraumatic, sclera non-icteric, no xanthomas, nares are without discharge.  Neck: No carotid bruits. JVD not elevated.  Lungs: Respirations regular and unlabored, without wheezes or rales.  Heart: Regular rate and rhythm. No S3 or S4.  No rubs or gallops appreciated. 2/6 holosystolic murmur along Apex.  Abdomen: Soft, non-tender, non-distended with normoactive bowel sounds. No hepatomegaly. No rebound/guarding. No obvious abdominal masses. Msk:  Strength and tone appear normal for age. No joint deformities or  effusions. Extremities: No clubbing or cyanosis. No lower extremity edema.  Distal pedal pulses are 2+ bilaterally. Neuro: Alert and oriented X 3. Moves all extremities spontaneously. No focal deficits noted. Psych:  Responds to questions appropriately with a normal affect. Skin: No rashes or lesions noted  Wt Readings from Last 3 Encounters:  09/05/18 192 lb (87.1 kg)  07/02/18 194 lb (88 kg)  06/19/18 193 lb 3.2 oz (87.6 kg)     Studies/Labs Reviewed:   EKG:  EKG is not ordered today.   Recent Labs: 02/26/2018: Hemoglobin 13.0; Platelets 268 07/02/2018: TSH 1.387 08/09/2018: BUN 12; Creatinine, Ser 0.54; Magnesium 1.7; Potassium 4.0; Sodium 134   Lipid Panel    Component Value Date/Time   CHOL  12/11/2010 0423    172        ATP III CLASSIFICATION:  <200     mg/dL   Desirable  200-239  mg/dL   Borderline High  >=240    mg/dL   High          TRIG 137 12/11/2010 0423   HDL 19 (L) 12/11/2010 0423   CHOLHDL 9.1 12/11/2010 0423   VLDL 27 12/11/2010 0423   LDLCALC (H) 12/11/2010 0423    126        Total Cholesterol/HDL:CHD Risk Coronary Heart Disease Risk Table                     Men   Women  1/2 Average Risk   3.4   3.3  Average Risk       5.0   4.4  2 X Average Risk   9.6   7.1  3 X Average Risk  23.4   11.0        Use the calculated Patient Ratio above and the CHD Risk Table to determine the patient's CHD Risk.        ATP III CLASSIFICATION (LDL):  <100     mg/dL   Optimal  100-129  mg/dL   Near or Above  Optimal  130-159  mg/dL   Borderline  160-189  mg/dL   High  >190     mg/dL   Very High    Additional studies/ records that were reviewed today include:   Cardiac Catheterization: 06/2010    NST: 04/2018  No diagnostic ST segment changes to indicate ischemia. Hypertensive blood pressure response. No chest pain was reported. There were no arrhythmias. Low risk Duke treadmill score of 5.5.  Blood pressure demonstrated a hypertensive  response to exercise.  Small, mild intensity, apical anterior defect that is partially reversible, most likely reflective of variable breast attenuation. Increased TID ratio 1.27 looks to be due to misregistration of images.  This is a low risk study.  Nuclear stress EF: 74%.  Echocardiogram: 07/2018 Study Conclusions  - Left ventricle: The cavity size was normal. Wall thickness was   increased in a pattern of mild LVH. Systolic function was normal.   The estimated ejection fraction was in the range of 60% to 65%.   Wall motion was normal; there were no regional wall motion   abnormalities. Features are consistent with a pseudonormal left   ventricular filling pattern, with concomitant abnormal relaxation   and increased filling pressure (grade 2 diastolic dysfunction).   Doppler parameters are consistent with high ventricular filling   pressure. - Mitral valve: There was mild regurgitation.  Event Monitor: 06/2018  Predominantly sinus rhythm with sinus tachycardia also seen.  21 beat run of ventricular tachycardia, heart rate 189 bpm.  Symptoms correlated with all of the above   Assessment:    1. Wide-complex tachycardia ([place])   2. Heart palpitations   3. Essential hypertension   4. Mitral valve insufficiency, unspecified etiology   5. Type 2 diabetes mellitus without complication, with long-term current use of insulin ([place])      Plan:   In order of problems listed above:  1. Wide-Complex Tachycardia/ Palpitations - recent event monitor read as showing a 21 beat run of VT with recent NST showing no significant ischemia and echo showing a preserved EF of 60-65% with no regional WMA as outlined above. - Reviewed with the patient's primary cardiologist, Dr. Bronson Ing, who recommended continuing with medical management at this time in the setting of no recent anginal symptoms. Given normal EF by recent echocardiogram he felt her wide-complex tachycardia was likely most  indicative of SVT with aberrancy as compared to VT which was documented in the initial report. No further testing indicated at this time prior to proceeding with previously scheduled colonoscopy. Will forward today's note to Dr. Arnoldo Morale at the patient's request.  - will continue on Lopressor 25mg  BID for now along with Magnesium supplementation (scheduled for repeat labs with her PCP next month). Patient was encouraged to make [place] aware if she develops any recurrent symptoms.   2. HTN - BP is well-controlled at 122/70 during today's visit.  - continue Losartan 25mg  daily and Lopressor 25mg  BID.   3. Mitral Regurgitation - mild regurgitation by echo in 07/2018. Continue to follow.   4. IDDM - recently switched to [facility] by her PCP. She has been self-titrating this as previously instructed by her PCP.    Medication Adjustments/Labs and Tests Ordered: Current medicines are reviewed at length with the patient today.  Concerns regarding medicines are outlined above.  Medication changes, Labs and Tests ordered today are listed in the Patient Instructions below. Patient Instructions  Medication Instructions:  Your physician recommends that you continue on your current medications as directed.  Please refer to the Current Medication list given to you today.  Labwork: NONE  Testing/Procedures: NONE  Follow-Up: Your physician wants you to follow-up in: 6 MONTHS.  You will receive a reminder letter in the mail two months in advance. If you don't receive a letter, please call our office to schedule the follow-up appointment.   Any Other Special Instructions Will Be Listed Below (If Applicable).     If you need a refill on your cardiac medications before your next appointment, please call your pharmacy.      Signed, Erma Heritage, PA-C  09/05/2018 8:31 PM    [address] [place],  [address] Phone: [phone] Fax: [phone]

## 2018-09-05 NOTE — Patient Instructions (Signed)

## 2018-09-06 NOTE — H&P (Signed)
Sarah [name] is an 58 y.o. female.   Chief Complaint: Need for screening colonoscopy HPI: Patient is a 58 year old black female who presents for a screening colonoscopy.  She was treated for multiple episodes of diverticulitis in 2019.  She has not had any significant episodes over the past few months.  She denies any fever or chills.  She does not have a family history of colon cancer.  She denies any blood per rectum.  She currently has 0 out of 10 abdominal pain.  Past Medical History:  Diagnosis Date  . Anxiety   . Diabetes mellitus   . Diverticulosis   . GERD (gastroesophageal reflux disease)   . Hypercholesteremia     Past Surgical History:  Procedure Laterality Date  . ABDOMINAL HYSTERECTOMY     complete  . CARDIAC CATHETERIZATION  2011  . COLONOSCOPY  01/04/2010   left sided transverse diverticula/two diminutive rectal polyps  . COLONOSCOPY  03/28/2012   Procedure: COLONOSCOPY;  Surgeon: Daneil Dolin, MD;  Location: [facility];  Service: Endoscopy;  Laterality: N/A;  9:30  . FOOT SURGERY     right-tendon repair  . laparoscopic sigmoid colectomy  02/01/10   Dr Jola Babinski  . TRIGGER FINGER RELEASE Left 03/2015  . TRIGGER FINGER RELEASE Right 02/25/2016   Procedure: RIGHT LONG TRIGGER FINGER RELEASE;  Surgeon: Carole Civil, MD;  Location: [facility];  Service: Orthopedics;  Laterality: Right;    Family History  Problem Relation Age of Onset  . Colon cancer Maternal Grandfather   . Colon polyps Mother   . Diabetes Mother   . Hypertension Mother   . Heart attack Father   . Heart murmur Sister   . Hypertension Brother   . Diabetes Son    Social History:  reports that she quit smoking about 12 years ago. Her smoking use included cigarettes. She has a 10.00 pack-year smoking history. She has never used smokeless tobacco. She reports current alcohol use. She reports that she does not use drugs.  Allergies:  Allergies  Allergen Reactions  . Sulfonamide  Derivatives Itching  . Latex Itching and Rash    Powdered ONly    No medications prior to admission.    No results found for this or any previous visit (from the past 48 hour(s)). No results found.  Review of Systems  Constitutional: Negative.   HENT: Negative.   Eyes: Negative.   Respiratory: Negative.   Cardiovascular: Negative.   Gastrointestinal: Negative.   Genitourinary: Negative.   Musculoskeletal: Negative.   Skin: Negative.   Neurological: Negative.   Endo/Heme/Allergies: Negative.   Psychiatric/Behavioral: Negative.     There were no vitals taken for this visit. Physical Exam  Vitals reviewed. Constitutional: She is oriented to person, place, and time. She appears well-developed and well-nourished.  HENT:  Head: Normocephalic and atraumatic.  Cardiovascular: Normal rate, regular rhythm and normal heart sounds. Exam reveals no gallop.  No murmur heard. Respiratory: Effort normal and breath sounds normal. No respiratory distress. She has no wheezes. She has no rales.  GI: Soft. Bowel sounds are normal. She exhibits no distension. There is no abdominal tenderness. There is no rebound and no guarding.  Neurological: She is alert and oriented to person, place, and time.  Skin: Skin is warm and dry.    Previous admission and office notes reviewed Assessment/Plan Impression: Need for screening colonoscopy, history of diverticulitis Patient is scheduled for a colonoscopy on 09/11/2018.  The risks and benefits of the procedure including  bleeding and perforation were fully explained to the patient, who gave informed consent.  Aviva Signs, MD 09/06/2018, 12:05 PM

## 2018-09-10 ENCOUNTER — Other Ambulatory Visit: Payer: Self-pay | Admitting: General Surgery

## 2018-09-10 DIAGNOSIS — K5732 Diverticulitis of large intestine without perforation or abscess without bleeding: Secondary | ICD-10-CM

## 2018-09-10 MED ORDER — PEG 3350-KCL-NABCB-NACL-NASULF 236 G PO SOLR: 4000.0000 mL | Freq: Once | ORAL | 0 refills | Status: AC

## 2018-09-11 ENCOUNTER — Ambulatory Visit (HOSPITAL_COMMUNITY)
Admission: RE | Admit: 2018-09-11 | Discharge: 2018-09-11 | Disposition: A | Discharge status: Home / Self Care | Payer: BC Managed Care – PPO | Attending: General Surgery | Admitting: General Surgery

## 2018-09-11 ENCOUNTER — Encounter (HOSPITAL_COMMUNITY): Payer: Self-pay | Admitting: *Deleted

## 2018-09-11 ENCOUNTER — Encounter (HOSPITAL_COMMUNITY)
Admission: RE | Disposition: A | Discharge status: Home / Self Care | Payer: Self-pay | Source: Home / Self Care | Attending: General Surgery

## 2018-09-11 ENCOUNTER — Other Ambulatory Visit: Payer: Self-pay

## 2018-09-11 DIAGNOSIS — Z90710 Acquired absence of both cervix and uterus: Secondary | ICD-10-CM | POA: Diagnosis not present

## 2018-09-11 DIAGNOSIS — Z1211 Encounter for screening for malignant neoplasm of colon: Secondary | ICD-10-CM

## 2018-09-11 DIAGNOSIS — K219 Gastro-esophageal reflux disease without esophagitis: Secondary | ICD-10-CM | POA: Diagnosis not present

## 2018-09-11 DIAGNOSIS — Z8249 Family history of ischemic heart disease and other diseases of the circulatory system: Secondary | ICD-10-CM | POA: Diagnosis not present

## 2018-09-11 DIAGNOSIS — E119 Type 2 diabetes mellitus without complications: Secondary | ICD-10-CM | POA: Insufficient documentation

## 2018-09-11 DIAGNOSIS — K5730 Diverticulosis of large intestine without perforation or abscess without bleeding: Secondary | ICD-10-CM | POA: Diagnosis not present

## 2018-09-11 DIAGNOSIS — F419 Anxiety disorder, unspecified: Secondary | ICD-10-CM | POA: Insufficient documentation

## 2018-09-11 DIAGNOSIS — Z91040 Latex allergy status: Secondary | ICD-10-CM | POA: Insufficient documentation

## 2018-09-11 DIAGNOSIS — Z833 Family history of diabetes mellitus: Secondary | ICD-10-CM | POA: Diagnosis not present

## 2018-09-11 DIAGNOSIS — Z8371 Family history of colonic polyps: Secondary | ICD-10-CM | POA: Diagnosis not present

## 2018-09-11 DIAGNOSIS — E7800 Pure hypercholesterolemia, unspecified: Secondary | ICD-10-CM | POA: Diagnosis not present

## 2018-09-11 DIAGNOSIS — Z882 Allergy status to sulfonamides status: Secondary | ICD-10-CM | POA: Insufficient documentation

## 2018-09-11 DIAGNOSIS — Z87891 Personal history of nicotine dependence: Secondary | ICD-10-CM | POA: Diagnosis not present

## 2018-09-11 DIAGNOSIS — Z800 Family history of malignant neoplasm of digestive organs: Secondary | ICD-10-CM | POA: Insufficient documentation

## 2018-09-11 DIAGNOSIS — Z980 Intestinal bypass and anastomosis status: Secondary | ICD-10-CM | POA: Diagnosis not present

## 2018-09-11 HISTORY — PX: COLONOSCOPY: SHX5424

## 2018-09-11 LAB — GLUCOSE, CAPILLARY: Glucose-Capillary: 177 mg/dL — ABNORMAL HIGH (ref 70–99)

## 2018-09-11 SURGERY — COLONOSCOPY
Anesthesia: Moderate Sedation

## 2018-09-11 MED ORDER — MIDAZOLAM HCL 5 MG/5ML IJ SOLN
INTRAMUSCULAR | Status: AC
  Filled 2018-09-11: qty 10

## 2018-09-11 MED ORDER — MEPERIDINE HCL 50 MG/ML IJ SOLN
INTRAMUSCULAR | Status: AC
  Filled 2018-09-11: qty 1

## 2018-09-11 MED ORDER — STERILE WATER FOR IRRIGATION IR SOLN
Status: DC | PRN
  Administered 2018-09-11: 100 mL

## 2018-09-11 MED ORDER — MIDAZOLAM HCL 5 MG/5ML IJ SOLN
INTRAMUSCULAR | Status: DC | PRN
  Administered 2018-09-11: 3 mg via INTRAVENOUS

## 2018-09-11 MED ORDER — SODIUM CHLORIDE 0.9 % IV SOLN
INTRAVENOUS | Status: DC
  Administered 2018-09-11: 1000 mL via INTRAVENOUS

## 2018-09-11 MED ORDER — MEPERIDINE HCL 50 MG/ML IJ SOLN
INTRAMUSCULAR | Status: DC | PRN
  Administered 2018-09-11: 50 mg via INTRAVENOUS

## 2018-09-11 NOTE — Interval H&P Note (Signed)
History and Physical Interval Note:  09/11/2018 7:18 AM  Sarah [name]  has presented today for surgery, with the diagnosis of screening  The various methods of treatment have been discussed with the patient and family. After consideration of risks, benefits and other options for treatment, the patient has consented to  Procedure(s): COLONOSCOPY (N/A) as a surgical intervention .  The patient's history has been reviewed, patient examined, no change in status, stable for surgery.  I have reviewed the patient's chart and labs.  Questions were answered to the patient's satisfaction.     Aviva Signs

## 2018-09-11 NOTE — Op Note (Signed)
[facility] Patient Name: Sarah [name] Procedure Date: 09/11/2018 7:21 AM MRN: [id number] Date of Birth: 1960/11/29 Attending MD: Aviva Signs , MD CSN: [id number] Age: 58 Admit Type: Outpatient Procedure:                Colonoscopy Indications:              Screening for colorectal malignant neoplasm Providers:                Aviva Signs, MD, Rosina Lowenstein, RN, Nelma Rothman,                            Technician Referring MD:              Medicines:                Midazolam 3 mg IV, Meperidine 50 mg IV Complications:            No immediate complications. Estimated blood loss:                            None. Estimated Blood Loss:     Estimated blood loss: none. Procedure:                Pre-Anesthesia Assessment:                           - Prior to the procedure, a History and Physical                            was performed, and patient medications and                            allergies were reviewed. The patient is competent.                            The risks and benefits of the procedure and the                            sedation options and risks were discussed with the                            patient. All questions were answered and informed                            consent was obtained. Patient identification and                            proposed procedure were verified by the physician,                            the nurse and the technician in the endoscopy                            suite. Mental Status Examination: alert and  oriented. Airway Examination: normal oropharyngeal                            airway and neck mobility. Respiratory Examination:                            clear to auscultation. CV Examination: RRR, no                            murmurs, no S3 or S4. Prophylactic Antibiotics: The                            patient does not require prophylactic antibiotics.                            Prior Anticoagulants:  The patient has taken no                            previous anticoagulant or antiplatelet agents. ASA                            Grade Assessment: II - A patient with mild systemic                            disease. After reviewing the risks and benefits,                            the patient was deemed in satisfactory condition to                            undergo the procedure. The anesthesia plan was to                            use moderate sedation / analgesia (conscious                            sedation). Immediately prior to administration of                            medications, the patient was re-assessed for                            adequacy to receive sedatives. The heart rate,                            respiratory rate, oxygen saturations, blood                            pressure, adequacy of pulmonary ventilation, and                            response to care were monitored throughout the  procedure. The physical status of the patient was                            re-assessed after the procedure.                           After obtaining informed consent, the colonoscope                            was passed under direct vision. Throughout the                            procedure, the patient's blood pressure, pulse, and                            oxygen saturations were monitored continuously. The                            CF-HQ190L ([id number]) scope was introduced through                            the anus and advanced to the the cecum, identified                            by the appendiceal orifice, ileocecal valve and                            palpation. No anatomical landmarks were                            photographed. The entire colon was well visualized.                            The colonoscopy was performed without difficulty.                            The patient tolerated the procedure well. The                             quality of the bowel preparation was adequate. The                            total duration of the procedure was 10 minutes. Scope In: 7:25:20 AM Scope Out: 7:35:33 AM Scope Withdrawal Time: 0 hours 3 minutes 35 seconds  Total Procedure Duration: 0 hours 10 minutes 13 seconds  Findings:      The perianal and digital rectal examinations were normal.      Multiple medium-mouthed diverticula were found in the descending colon.       There was no evidence of diverticular bleeding. Estimated blood loss:       none.      The entire examined colon appeared normal on direct and retroflexion       views.      Anastomosis from previous sigmoid colectomy widely patent. Impression:               -  Moderate diverticulosis in the descending colon.                            There was no evidence of diverticular bleeding.                           - The entire examined colon is normal on direct and                            retroflexion views.                           - No specimens collected. Moderate Sedation:      Moderate (conscious) sedation was administered by the endoscopy nurse       and supervised by the endoscopist. [Parameters Monitored]. [Sedation       Duration Time]. Recommendation:           - Written discharge instructions were provided to                            the patient.                           - The signs and symptoms of potential delayed                            complications were discussed with the patient.                           - Patient has a contact number available for                            emergencies.                           - Return to normal activities tomorrow.                           - Resume previous diet.                           - Continue present medications.                           - Repeat colonoscopy in 10 years for screening                            purposes. Procedure Code(s):        --- Professional ---                            [phone], Colonoscopy, flexible; diagnostic, including                            collection of specimen(s) by brushing or washing,  when performed (separate procedure) Diagnosis Code(s):        --- Professional ---                           Z12.11, Encounter for screening for malignant                            neoplasm of colon                           K57.30, Diverticulosis of large intestine without                            perforation or abscess without bleeding CPT copyright 2018 American [employer] Association. All rights reserved. The codes documented in this report are preliminary and upon coder review may  be revised to meet current compliance requirements. Aviva Signs, MD Aviva Signs, MD 09/11/2018 7:42:27 AM This report has been signed electronically. Number of Addenda: 0

## 2018-09-11 NOTE — Discharge Instructions (Signed)
Colonoscopy, Adult, Care After  This sheet gives you information about how to care for yourself after your procedure. Your health care provider may also give you more specific instructions. If you have problems or questions, contact your health care provider.  What can I expect after the procedure?  After the procedure, it is common to have:  · A small amount of blood in your stool for 24 hours after the procedure.  · Some gas.  · Mild abdominal cramping or bloating.  Follow these instructions at home:  General instructions  · For the first 24 hours after the procedure:  ? Do not drive or use machinery.  ? Do not sign important documents.  ? Do not drink alcohol.  ? Do your regular daily activities at a slower pace than normal.  ? Eat soft, easy-to-digest foods.  · Take over-the-counter or prescription medicines only as told by your health care provider.  Relieving cramping and bloating    · Try walking around when you have cramps or feel bloated.  · Apply heat to your abdomen as told by your health care provider. Use a heat source that your health care provider recommends, such as a moist heat pack or a heating pad.  ? Place a towel between your skin and the heat source.  ? Leave the heat on for 20-30 minutes.  ? Remove the heat if your skin turns bright red. This is especially important if you are unable to feel pain, heat, or cold. You may have a greater risk of getting burned.  Eating and drinking    · Drink enough fluid to keep your urine pale yellow.  · Resume your normal diet as instructed by your health care provider. Avoid heavy or fried foods that are hard to digest.  · Avoid drinking alcohol for as long as instructed by your health care provider.  Contact a health care provider if:  · You have blood in your stool 2-3 days after the procedure.  Get help right away if:  · You have more than a small spotting of blood in your stool.  · You pass large blood clots in your stool.  · Your abdomen is  swollen.  · You have nausea or vomiting.  · You have a fever.  · You have increasing abdominal pain that is not relieved with medicine.  Summary  · After the procedure, it is common to have a small amount of blood in your stool. You may also have mild abdominal cramping and bloating.  · For the first 24 hours after the procedure, do not drive or use machinery, sign important documents, or drink alcohol.  · Contact your health care provider if you have a lot of blood in your stool, nausea or vomiting, a fever, or increased abdominal pain.  This information is not intended to replace advice given to you by your health care provider. Make sure you discuss any questions you have with your health care provider.  Document Released: 03/22/2004 Document Revised: 05/31/2017 Document Reviewed: 10/20/2015  Elsevier Interactive Patient Education © 2019 [employer]

## 2018-09-13 ENCOUNTER — Encounter (HOSPITAL_COMMUNITY): Payer: Self-pay | Admitting: General Surgery

## 2018-10-09 ENCOUNTER — Emergency Department (HOSPITAL_COMMUNITY): Payer: BC Managed Care – PPO

## 2018-10-09 ENCOUNTER — Emergency Department (HOSPITAL_COMMUNITY)
Admission: EM | Admit: 2018-10-09 | Discharge: 2018-10-09 | Disposition: A | Discharge status: Home / Self Care | Payer: BC Managed Care – PPO | Attending: Emergency Medicine | Admitting: Emergency Medicine

## 2018-10-09 ENCOUNTER — Other Ambulatory Visit: Payer: Self-pay

## 2018-10-09 ENCOUNTER — Encounter (HOSPITAL_COMMUNITY): Payer: Self-pay

## 2018-10-09 DIAGNOSIS — J111 Influenza due to unidentified influenza virus with other respiratory manifestations: Secondary | ICD-10-CM | POA: Insufficient documentation

## 2018-10-09 DIAGNOSIS — Z87891 Personal history of nicotine dependence: Secondary | ICD-10-CM | POA: Diagnosis not present

## 2018-10-09 DIAGNOSIS — R69 Illness, unspecified: Secondary | ICD-10-CM

## 2018-10-09 DIAGNOSIS — I10 Essential (primary) hypertension: Secondary | ICD-10-CM | POA: Insufficient documentation

## 2018-10-09 DIAGNOSIS — Z91040 Latex allergy status: Secondary | ICD-10-CM | POA: Diagnosis not present

## 2018-10-09 DIAGNOSIS — Z79899 Other long term (current) drug therapy: Secondary | ICD-10-CM | POA: Diagnosis not present

## 2018-10-09 DIAGNOSIS — Z7984 Long term (current) use of oral hypoglycemic drugs: Secondary | ICD-10-CM | POA: Diagnosis not present

## 2018-10-09 DIAGNOSIS — Z7982 Long term (current) use of aspirin: Secondary | ICD-10-CM | POA: Insufficient documentation

## 2018-10-09 DIAGNOSIS — E119 Type 2 diabetes mellitus without complications: Secondary | ICD-10-CM | POA: Diagnosis not present

## 2018-10-09 DIAGNOSIS — R0602 Shortness of breath: Secondary | ICD-10-CM | POA: Diagnosis present

## 2018-10-09 DIAGNOSIS — E871 Hypo-osmolality and hyponatremia: Secondary | ICD-10-CM

## 2018-10-09 DIAGNOSIS — R509 Fever, unspecified: Secondary | ICD-10-CM

## 2018-10-09 LAB — LACTIC ACID, PLASMA
Lactic Acid, Venous: 1.8 mmol/L (ref 0.5–1.9)
Lactic Acid, Venous: 1.5 mmol/L (ref 0.5–1.9)

## 2018-10-09 LAB — COMPREHENSIVE METABOLIC PANEL
ALK PHOS: 76 U/L (ref 38–126)
ALT: 81 U/L — ABNORMAL HIGH (ref 0–44)
AST: 92 U/L — ABNORMAL HIGH (ref 15–41)
Albumin: 3.6 g/dL (ref 3.5–5.0)
Anion gap: 9 (ref 5–15)
BUN: 16 mg/dL (ref 6–20)
CHLORIDE: 98 mmol/L (ref 98–111)
CO2: 22 mmol/L (ref 22–32)
Calcium: 8.7 mg/dL — ABNORMAL LOW (ref 8.9–10.3)
Creatinine, Ser: 0.79 mg/dL (ref 0.44–1.00)
GFR calc Af Amer: >60 mL/min (ref >60)
GFR calc non Af Amer: >60 mL/min (ref >60)
Glucose, Bld: 319 mg/dL — ABNORMAL HIGH (ref 70–99)
Potassium: 4.1 mmol/L (ref 3.5–5.1)
SODIUM: 129 mmol/L — AB (ref 135–145)
Total Bilirubin: 0.4 mg/dL (ref 0.3–1.2)
Total Protein: 7.9 g/dL (ref 6.5–8.1)

## 2018-10-09 LAB — URINALYSIS, ROUTINE W REFLEX MICROSCOPIC
Bilirubin Urine: NEGATIVE
Glucose, UA: 50 mg/dL — AB
Hgb urine dipstick: NEGATIVE
Ketones, ur: NEGATIVE mg/dL
Leukocytes,Ua: NEGATIVE
Nitrite: NEGATIVE
PROTEIN: NEGATIVE mg/dL
Specific Gravity, Urine: 1.026 (ref 1.005–1.030)
pH: 6 (ref 5.0–8.0)

## 2018-10-09 LAB — CBC WITH DIFFERENTIAL/PLATELET
Abs Immature Granulocytes: 0.05 10*3/uL (ref 0.00–0.07)
Basophils Absolute: 0 10*3/uL (ref 0.0–0.1)
Basophils Relative: 0 %
Eosinophils Absolute: 0.1 10*3/uL (ref 0.0–0.5)
Eosinophils Relative: 1 %
HCT: 39.5 % (ref 36.0–46.0)
Hemoglobin: 13.8 g/dL (ref 12.0–15.0)
Immature Granulocytes: 1 %
Lymphocytes Relative: 7 %
Lymphs Abs: 0.7 10*3/uL (ref 0.7–4.0)
MCH: 27.8 pg (ref 26.0–34.0)
MCHC: 34.9 g/dL (ref 30.0–36.0)
MCV: 79.6 fL — ABNORMAL LOW (ref 80.0–100.0)
Monocytes Absolute: 0.8 10*3/uL (ref 0.1–1.0)
Monocytes Relative: 8 %
Neutro Abs: 8 10*3/uL — ABNORMAL HIGH (ref 1.7–7.7)
Neutrophils Relative %: 83 %
Platelets: 272 10*3/uL (ref 150–400)
RBC: 4.96 MIL/uL (ref 3.87–5.11)
RDW: 14.1 % (ref 11.5–15.5)
WBC: 9.5 10*3/uL (ref 4.0–10.5)
nRBC: 0 % (ref 0.0–0.2)

## 2018-10-09 LAB — PROTIME-INR
INR: 1.04
Prothrombin Time: 13.5 seconds (ref 11.4–15.2)

## 2018-10-09 MED ORDER — SODIUM CHLORIDE 0.9% FLUSH: 3.0000 mL | Freq: Once | INTRAVENOUS | Status: DC

## 2018-10-09 MED ORDER — ACETAMINOPHEN 325 MG PO TABS
650.0000 mg | ORAL_TABLET | Freq: Once | ORAL | Status: AC
  Administered 2018-10-09: 650 mg via ORAL
  Filled 2018-10-09: qty 2

## 2018-10-09 NOTE — Discharge Instructions (Addendum)
Rest.  Drink plenty of fluids.  Take acetaminophen and/or ibuprofen as needed for fever or aching.  Return if symptoms are getting worse.

## 2018-10-09 NOTE — ED Provider Notes (Signed)
[facility] EMERGENCY DEPARTMENT Provider Note   CSN: [id number] Arrival date & time: 10/09/18  0007    History   Chief Complaint Chief Complaint  Patient presents with  . Shortness of Breath    HPI Sarah [name] is a 58 y.o. female.  The history is provided by the patient.  Shortness of Breath  She has history of diabetes, hypertension and hyperlipidemia and comes in because of fever and chills.  She has been sick for about a week with a nonproductive cough and low-grade fevers.  Tonight, temperature went up over 102 with associated chills and sweats.  Cough continues to be nonproductive.  She has been prescribed a cough syrup which is not helping.  She complains of generalized body aches.  There has been no nausea or vomiting.  She has had sick contacts.  She did not get the influenza immunization this year.  She did see her PCP last week and got a steroid injection.  She denies travel to [place] or contact with people who have traveled to [place].  Past Medical History:  Diagnosis Date  . Anxiety   . Diabetes mellitus   . Diverticulosis   . GERD (gastroesophageal reflux disease)   . Hypercholesteremia     Patient Active Problem List   Diagnosis Date Noted  . Special screening for malignant neoplasms, colon   . Diverticulosis of large intestine without diverticulitis   . History of palpitations in adulthood 07/02/2018  . Murmur, cardiac 07/02/2018  . History of chest pain 06/27/2018  . Essential hypertension 06/27/2018  . Trigger middle finger   . Diabetes mellitus type 2 in obese ([place]) 08/19/2015  . 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 Visit for routine gyn exam 08/19/2015  . Type 2 diabetes mellitus ([place]) 06/24/2013  . Rectal bleed 03/23/2012  . Hyperlipidemia 11/30/2009  . ANXIETY 11/30/2009  . CONSTIPATION 11/30/2009    Past Surgical History:  Procedure Laterality Date  . ABDOMINAL HYSTERECTOMY     complete  . CARDIAC CATHETERIZATION  2011   . COLONOSCOPY  01/04/2010   left sided transverse diverticula/two diminutive rectal polyps  . COLONOSCOPY  03/28/2012   Procedure: COLONOSCOPY;  Surgeon: Daneil Dolin, MD;  Location: [facility] ENDO SUITE;  Service: Endoscopy;  Laterality: N/A;  9:30  . COLONOSCOPY N/A 09/11/2018   Procedure: COLONOSCOPY;  Surgeon: Aviva Signs, MD;  Location: [facility] ENDO SUITE;  Service: Gastroenterology;  Laterality: N/A;  . FOOT SURGERY     right-tendon repair  . laparoscopic sigmoid colectomy  02/01/10   Dr Jola Babinski  . TRIGGER FINGER RELEASE Left 03/2015  . TRIGGER FINGER RELEASE Right 02/25/2016   Procedure: RIGHT LONG TRIGGER FINGER RELEASE;  Surgeon: Carole Civil, MD;  Location: [facility] ORS;  Service: Orthopedics;  Laterality: Right;     OB History    Gravida  1   Para  1   Term  1   Preterm      AB      Living  1     SAB      TAB      Ectopic      Multiple      Live Births               Home Medications    Prior to Admission medications   Medication Sig Start Date End Date Taking? Authorizing Provider  aspirin EC 81 MG tablet Take 81 mg by mouth daily.    [provider]  gabapentin (NEURONTIN) 100 MG capsule Take 100 mg by mouth at bedtime. 08/20/18   [provider]  Insulin Glargine-Lixisenatide (SOLIQUA) 100-33 UNT-MCG/ML SOPN Inject 33 Units/day into the skin daily. Patient taking differently: Inject 36 Units into the skin daily.  09/05/18   Strader, Fransisco Hertz, PA-C  losartan (COZAAR) 25 MG tablet Take 1 tablet (25 mg total) by mouth daily. 09/05/18   Strader, Fransisco Hertz, PA-C  Magnesium 250 MG TABS Take 250 mg by mouth daily.    [provider]  metFORMIN (GLUCOPHAGE-XR) 500 MG 24 hr tablet Take 1,000 mg by mouth 2 (two) times daily.  09/06/16   [provider]  metoprolol tartrate (LOPRESSOR) 25 MG tablet Take 1 tablet (25 mg total) by mouth 2 (two) times daily. 09/05/18 12/04/18  Strader, Fransisco Hertz, PA-C  Multiple  Vitamins-Minerals (MULTIVITAMIN PO) Take 1 tablet by mouth daily.    [provider]    Family History Family History  Problem Relation Age of Onset  . Colon cancer Maternal Grandfather   . Colon polyps Mother   . Diabetes Mother   . Hypertension Mother   . Heart attack Father   . Heart murmur Sister   . Hypertension Brother   . Diabetes Son     Social History Social History   Tobacco Use  . Smoking status: Former Smoker    Packs/day: 1.00    Years: 10.00    Pack years: 10.00    Types: Cigarettes    Last attempt to quit: 09/05/2006    Years since quitting: 12.1  . Smokeless tobacco: Never Used  Substance Use Topics  . Alcohol use: Yes    Comment: occasional  . Drug use: No     Allergies   Sulfonamide derivatives and Latex   Review of Systems Review of Systems  Respiratory: Positive for shortness of breath.   All other systems reviewed and are negative.    Physical Exam Updated Vital Signs BP (!) 139/55 (BP Location: Right Arm)   Pulse (!) 109   Temp (!) 102.8 F (39.3 C) (Oral)   Resp 20   Ht 5\' 3"  (1.6 m)   Wt 86.2 kg   SpO2 93%   BMI 33.66 kg/m   Physical Exam Vitals signs and nursing note reviewed.    58 year old female, resting comfortably and in no acute distress. Vital signs are significant for elevated temperature and heart rate. Oxygen saturation is 93%, which is normal. Head is normocephalic and atraumatic. PERRLA, EOMI. Oropharynx is clear. Neck is nontender and supple without adenopathy or JVD. Back is nontender and there is no CVA tenderness. Lungs have coarse breath sounds with faint expiratory wheezes.  There are no rales or rhonchi. Chest is nontender. Heart has regular rate and rhythm with 2/6 systolic ejection murmur best heard at the left sternal border. Abdomen is soft, flat, nontender without masses or hepatosplenomegaly and peristalsis is normoactive. Extremities have no cyanosis or edema, full range of motion is  present. Skin is warm and dry without rash. Neurologic: Mental status is normal, cranial nerves are intact, there are no motor or sensory deficits.  ED Treatments / Results  Labs (all labs ordered are listed, but only abnormal results are displayed) Labs Reviewed  COMPREHENSIVE METABOLIC PANEL - Abnormal; Notable for the following components:      Result Value   Sodium 129 (*)    Glucose, Bld 319 (*)    Calcium 8.7 (*)    AST 92 (*)  ALT 81 (*)    All other components within normal limits  CBC WITH DIFFERENTIAL/PLATELET - Abnormal; Notable for the following components:   MCV 79.6 (*)    Neutro Abs 8.0 (*)    All other components within normal limits  URINALYSIS, ROUTINE W REFLEX MICROSCOPIC - Abnormal; Notable for the following components:   APPearance HAZY (*)    Glucose, UA 50 (*)    All other components within normal limits  CULTURE, BLOOD (ROUTINE X 2)  CULTURE, BLOOD (ROUTINE X 2)  LACTIC ACID, PLASMA  LACTIC ACID, PLASMA  PROTIME-INR    EKG EKG Interpretation  Date/Time:  Tuesday October 09 2018 00:31:12 EST Ventricular Rate:  110 PR Interval:  116 QRS Duration: 70 QT Interval:  306 QTC Calculation: 414 R Axis:   63 Text Interpretation:  Sinus tachycardia Nonspecific T wave abnormality Abnormal ECG When compared with ECG of 02/26/2018, T wave inversion has improved Confirmed by Delora Fuel ([id number]) on 10/09/2018 12:55:05 AM   Radiology Dg Chest 2 View  Result Date: 10/09/2018 CLINICAL DATA:  58 year old female with suspected sepsis. EXAM: CHEST - 2 VIEW COMPARISON:  Chest radiographs 02/26/2018 and earlier. FINDINGS: Upright AP and lateral views of the chest. Mildly lower lung volumes. Normal cardiac size and mediastinal contours. Visualized tracheal air column is within normal limits. No pneumothorax, pulmonary edema, pleural effusion or confluent pulmonary opacity. No acute osseous abnormality identified. Negative visible bowel gas pattern. IMPRESSION: Negative.   No acute cardiopulmonary abnormality. Electronically Signed   By: Genevie Ann M.D.   On: 10/09/2018 02:16    Procedures Procedures   Medications Ordered in ED Medications  sodium chloride flush (NS) 0.9 % injection 3 mL (has no administration in time range)     Initial Impression / Assessment and Plan / ED Course  I have reviewed the triage vital signs and the nursing notes.  Pertinent labs & imaging results that were available during my care of the patient were reviewed by me and considered in my medical decision making (see chart for details).  Influenza-like illness with worsening fever worrisome for secondary infection such as pneumonia.  Sepsis evaluation is initiated.  We will hold off on antibiotics unless chest x-ray shows pneumonia, since I believe the problem is a viral illness.  Old records are reviewed, and she does have a prior ED visit for an upper respiratory infection, but no recent ED visits for infection.  Following acetaminophen, temperature is come down and patient is feeling significantly better.  Chest x-ray shows no evidence of pneumonia.  WBC is normal albeit with a borderline left shift.  Mild hyponatremia is present, slightly worse than was present in December 2019, but not severe enough to warrant treatment.  Hyperglycemia is present, not significantly changed from prior values.  Lactic acid level is normal.  She is discharged with instructions to take over-the-counter analgesics as needed for fever and aching, follow-up with PCP.  Given work release for 2 days.  Final Clinical Impressions(s) / ED Diagnoses   Final diagnoses:  Influenza-like illness  Fever in adult  Hyponatremia    ED Discharge Orders    None       Delora Fuel, MD 78/46/96 [phone]

## 2018-10-09 NOTE — ED Triage Notes (Signed)
Pt brought to ED with complaints of SOB which started tonight. Pt recently seen for upper respiratory infection last Tuesday. Pt with fever started tonight. Pt lethargic in triage.

## 2018-10-14 LAB — CULTURE, BLOOD (ROUTINE X 2)
Culture: NO GROWTH
Culture: NO GROWTH
Special Requests: ADEQUATE

## 2019-07-12 ENCOUNTER — Telehealth: Payer: Self-pay | Admitting: Cardiovascular Disease

## 2019-07-12 NOTE — Telephone Encounter (Signed)
Virtual Visit Pre-Appointment Phone Call  "(Name), I am calling you today to discuss your upcoming appointment. We are currently trying to limit exposure to the virus that causes COVID-19 by seeing patients at home rather than in the office."  1. "What is the BEST phone number to call the day of the visit?" - [phone]  2. Do you have or have access to (through a family member/friend) a smartphone with video capability that we can use for your visit?" a. If yes - list this number in appt notes as cell (if different from BEST phone #) and list the appointment type as a VIDEO visit in appointment notes b. If no - list the appointment type as a PHONE visit in appointment notes  3. Confirm consent - "In the setting of the current Covid19 crisis, you are scheduled for a (phone or video) visit with your provider on (date) at (time).  Just as we do with many in-office visits, in order for you to participate in this visit, we must obtain consent.  If you'd like, I can send this to your mychart (if signed up) or email for you to review.  Otherwise, I can obtain your verbal consent now.  All virtual visits are billed to your insurance company just like a normal visit would be.  By agreeing to a virtual visit, we'd like you to understand that the technology does not allow for your provider to perform an examination, and thus may limit your provider's ability to fully assess your condition. If your provider identifies any concerns that need to be evaluated in person, we will make arrangements to do so.  Finally, though the technology is pretty good, we cannot assure that it will always work on either your or our end, and in the setting of a video visit, we may have to convert it to a phone-only visit.  In either situation, we cannot ensure that we have a secure connection.  Are you willing to proceed?" STAFF: Did the patient verbally acknowledge consent to telehealth visit? Document YES/NO here:    YES  4. Advise patient to be prepared - "Two hours prior to your appointment, go ahead and check your blood pressure, pulse, oxygen saturation, and your weight (if you have the equipment to check those) and write them all down. When your visit starts, your provider will ask you for this information. If you have an Apple Watch or Kardia device, please plan to have heart rate information ready on the day of your appointment. Please have a pen and paper handy nearby the day of the visit as well."  5. Give patient instructions for MyChart download to smartphone OR Doximity/Doxy.me as below if video visit (depending on what platform provider is using)  6. Inform patient they will receive a phone call 15 minutes prior to their appointment time (may be from unknown caller ID) so they should be prepared to answer    [facility] has been deemed a candidate for a follow-up tele-health visit to limit community exposure during the Covid-19 pandemic. I spoke with the patient via phone to ensure availability of phone/video source, confirm preferred email & phone number, and discuss instructions and expectations.  I reminded Sarah [name] to be prepared with any vital sign and/or heart rhythm information that could potentially be obtained via home monitoring, at the time of her visit. I reminded Sarah [name] to expect a phone call prior to her visit.  Sarah [name] 07/12/2019 3:42 PM   INSTRUCTIONS FOR DOWNLOADING THE MYCHART APP TO SMARTPHONE  - The patient must first make sure to have activated MyChart and know their login information - If Apple, go to [employer] and type in MyChart in the search bar and download the app. If Android, ask patient to go to [employer] and type in [place] in the search bar and download the app. The app is free but as with any other app downloads, their phone may require them to verify saved payment information or Apple/Android password.   - The patient will need to then log into the app with their MyChart username and password, and select [facility] as their healthcare provider to link the account. When it is time for your visit, go to the MyChart app, find appointments, and click Begin Video Visit. Be sure to Select Allow for your device to access the Microphone and Camera for your visit. You will then be connected, and your provider will be with you shortly.  **If they have any issues connecting, or need assistance please contact MyChart service desk [phone])**  **If using a computer, in order to ensure the best quality for their visit they will need to use either of the following Internet Browsers: [employer], or Google Chrome**  IF USING DOXIMITY or DOXY.ME - The patient will receive a link just prior to their visit by text.     FULL LENGTH CONSENT FOR TELE-HEALTH VISIT   I hereby voluntarily request, consent and authorize [facility] and its employed or contracted physicians, physician assistants, nurse practitioners or other licensed health care professionals (the Practitioner), to provide me with telemedicine health care services (the Services") as deemed necessary by the treating Practitioner. I acknowledge and consent to receive the Services by the Practitioner via telemedicine. I understand that the telemedicine visit will involve communicating with the Practitioner through live audiovisual communication technology and the disclosure of certain medical information by electronic transmission. I acknowledge that I have been given the opportunity to request an in-person assessment or other available alternative prior to the telemedicine visit and am voluntarily participating in the telemedicine visit.  I understand that I have the right to withhold or withdraw my consent to the use of telemedicine in the course of my care at any time, without affecting my right to future care or treatment, and that  the Practitioner or I may terminate the telemedicine visit at any time. I understand that I have the right to inspect all information obtained and/or recorded in the course of the telemedicine visit and may receive copies of available information for a reasonable fee.  I understand that some of the potential risks of receiving the Services via telemedicine include:   Delay or interruption in medical evaluation due to technological equipment failure or disruption;  Information transmitted may not be sufficient (e.g. poor resolution of images) to allow for appropriate medical decision making by the Practitioner; and/or   In rare instances, security protocols could fail, causing a breach of personal health information.  Furthermore, I acknowledge that it is my responsibility to provide information about my medical history, conditions and care that is complete and accurate to the best of my ability. I acknowledge that Practitioner's advice, recommendations, and/or decision may be based on factors not within their control, such as incomplete or inaccurate data provided by me or distortions of diagnostic images or specimens that may result from electronic transmissions. I understand that the  practice of medicine is not an Chief Strategy Officer and that Practitioner makes no warranties or guarantees regarding treatment outcomes. I acknowledge that I will receive a copy of this consent concurrently upon execution via email to the email address I last provided but may also request a printed copy by calling the office of [place].    I understand that my insurance will be billed for this visit.   I have read or had this consent read to me.  I understand the contents of this consent, which adequately explains the benefits and risks of the Services being provided via telemedicine.   I have been provided ample opportunity to ask questions regarding this consent and the Services and have had my questions answered to  my satisfaction.  I give my informed consent for the services to be provided through the use of telemedicine in my medical care  By participating in this telemedicine visit I agree to the above.

## 2019-07-26 ENCOUNTER — Telehealth (INDEPENDENT_AMBULATORY_CARE_PROVIDER_SITE_OTHER): Payer: BC Managed Care – PPO | Admitting: Cardiovascular Disease

## 2019-07-26 ENCOUNTER — Encounter: Payer: Self-pay | Admitting: Cardiovascular Disease

## 2019-07-26 VITALS — BP 170/68 | HR 74 | Ht 63.0 in | Wt 185.0 lb

## 2019-07-26 DIAGNOSIS — R000 Tachycardia, unspecified: Secondary | ICD-10-CM

## 2019-07-26 DIAGNOSIS — I340 Nonrheumatic mitral (valve) insufficiency: Secondary | ICD-10-CM

## 2019-07-26 DIAGNOSIS — R002 Palpitations: Secondary | ICD-10-CM

## 2019-07-26 DIAGNOSIS — I10 Essential (primary) hypertension: Secondary | ICD-10-CM

## 2019-07-26 DIAGNOSIS — I472 Ventricular tachycardia: Secondary | ICD-10-CM

## 2019-07-26 MED ORDER — LOSARTAN POTASSIUM 50 MG PO TABS: 50.0000 mg | ORAL_TABLET | Freq: Every day | ORAL | 3 refills | Status: DC

## 2019-07-26 MED ORDER — METOPROLOL TARTRATE 50 MG PO TABS: 50.0000 mg | ORAL_TABLET | Freq: Two times a day (BID) | ORAL | 3 refills | Status: DC

## 2019-07-26 NOTE — Patient Instructions (Signed)
Medication Instructions:  INCREASE Lopressor to 50 mg twice a day  INCREASE Losartan to 50 mg daily   *If you need a refill on your cardiac medications before your next appointment, please call your pharmacy*  Lab Work: None today If you have labs (blood work) drawn today and your tests are completely normal, you will receive your results only by: Marland Kitchen MyChart Message (if you have MyChart) OR . A paper copy in the mail If you have any lab test that is abnormal or we need to change your treatment, we will call you to review the results.  Testing/Procedures: None today   Follow-Up: At [facility], you and your health needs are our priority.  As part of our continuing mission to provide you with exceptional heart care, we have created designated Provider Care Teams.  These Care Teams include your primary Cardiologist (physician) and Advanced Practice Providers (APPs -  Physician Assistants and Nurse Practitioners) who all work together to provide you with the care you need, when you need it.  Your next appointment:   4 month(s)  The format for your next appointment:   Virtual Visit   Provider:   Kate Sable, MD  Other Instructions None     Thank you for choosing [facility] !

## 2019-07-26 NOTE — Addendum Note (Signed)
Addended by: Barbarann Ehlers A on: 07/26/2019 10:38 AM   Modules accepted: Orders

## 2019-07-26 NOTE — Progress Notes (Signed)
Virtual Visit via Telephone Note   This visit type was conducted due to national recommendations for restrictions regarding the COVID-19 Pandemic (e.g. social distancing) in an effort to limit this patient's exposure and mitigate transmission in our community.  Due to her co-morbid illnesses, this patient is at least at moderate risk for complications without adequate follow up.  This format is felt to be most appropriate for this patient at this time.  The patient did not have access to video technology/had technical difficulties with video requiring transitioning to audio format only (telephone).  All issues noted in this document were discussed and addressed.  No physical exam could be performed with this format.  Please refer to the patient's chart for her  consent to telehealth for [facility].   Date:  07/26/2019   ID:  Sarah [name], DOB [birth year], MRN [id number]  Patient Location: Home Provider Location: Home  PCP:  Celene Squibb, MD  Cardiologist:  Kate Sable, MD  Electrophysiologist:  None   Evaluation Performed:  Follow-Up Visit  Chief Complaint: Palpitations  History of Present Illness:    Sarah [name] is a 58 y.o. female with a history of palpitations with previous event monitoring demonstrating a 21 beat run of wide-complex tachycardia.  She's had some more palpitations lately with tachycardia and associated shortness of breath. She denies exertional chest pain.  She has occasional feet swelling. She denies paroxysmal nocturnal dyspnea.  Past Medical History:  Diagnosis Date  . Anxiety   . Diabetes mellitus   . Diverticulosis   . GERD (gastroesophageal reflux disease)   . Hypercholesteremia    Past Surgical History:  Procedure Laterality Date  . ABDOMINAL HYSTERECTOMY     complete  . CARDIAC CATHETERIZATION  2011  . COLONOSCOPY  01/04/2010   left sided transverse diverticula/two diminutive rectal polyps  . COLONOSCOPY  03/28/2012   Procedure:  COLONOSCOPY;  Surgeon: Daneil Dolin, MD;  Location: [facility];  Service: Endoscopy;  Laterality: N/A;  9:30  . COLONOSCOPY N/A 09/11/2018   Procedure: COLONOSCOPY;  Surgeon: Aviva Signs, MD;  Location: [facility];  Service: Gastroenterology;  Laterality: N/A;  . FOOT SURGERY     right-tendon repair  . laparoscopic sigmoid colectomy  02/01/10   Dr Jola Babinski  . TRIGGER FINGER RELEASE Left 03/2015  . TRIGGER FINGER RELEASE Right 02/25/2016   Procedure: RIGHT LONG TRIGGER FINGER RELEASE;  Surgeon: Carole Civil, MD;  Location: [facility] ORS;  Service: Orthopedics;  Laterality: Right;     Current Meds  Medication Sig  . aspirin EC 81 MG tablet Take 81 mg by mouth daily.  Marland Kitchen escitalopram (LEXAPRO) 5 MG tablet TAKE (1) TABLET BY MOUTHEAT BEDTIME.  Marland Kitchen gabapentin (NEURONTIN) 100 MG capsule Take 100 mg by mouth at bedtime.  . Insulin Glargine-Lixisenatide (SOLIQUA) 100-33 UNT-MCG/ML SOPN Inject 33 Units/day into the skin daily. (Patient taking differently: Inject 36 Units into the skin daily. )  . losartan (COZAAR) 25 MG tablet Take 1 tablet (25 mg total) by mouth daily.  . Magnesium 250 MG TABS Take 250 mg by mouth daily.  . metFORMIN (GLUCOPHAGE-XR) 500 MG 24 hr tablet Take 1,000 mg by mouth 2 (two) times daily.   . metoprolol tartrate (LOPRESSOR) 25 MG tablet Take 1 tablet (25 mg total) by mouth 2 (two) times daily.  . Multiple Vitamins-Minerals (MULTIVITAMIN PO) Take 1 tablet by mouth daily.     Allergies:   Sulfonamide derivatives and Latex   Social History  Tobacco Use  . Smoking status: Former Smoker    Packs/day: 1.00    Years: 10.00    Pack years: 10.00    Types: Cigarettes    Quit date: 09/05/2006    Years since quitting: 12.8  . Smokeless tobacco: Never Used  Substance Use Topics  . Alcohol use: Yes    Comment: occasional  . Drug use: No     Family Hx: The patient's family history includes Colon cancer in her maternal grandfather; Colon polyps in her  mother; Diabetes in her mother and son; Heart attack in her father; Heart murmur in her sister; Hypertension in her brother and mother.  ROS:   Please see the history of present illness.     All other systems reviewed and are negative.   Prior CV studies:   The following studies were reviewed today:  Additional studies/ records that were reviewed today include:   Cardiac Catheterization: 06/2010    NST: 04/2018  No diagnostic ST segment changes to indicate ischemia. Hypertensive blood pressure response. No chest pain was reported. There were no arrhythmias. Low risk Duke treadmill score of 5.5.  Blood pressure demonstrated a hypertensive response to exercise.  Small, mild intensity, apical anterior defect that is partially reversible, most likely reflective of variable breast attenuation. Increased TID ratio 1.27 looks to be due to misregistration of images.  This is a low risk study.  Nuclear stress EF: 74%.  Echocardiogram: 07/2018 Study Conclusions  - Left ventricle: The cavity size was normal. Wall thickness was increased in a pattern of mild LVH. Systolic function was normal. The estimated ejection fraction was in the range of 60% to 65%. Wall motion was normal; there were no regional wall motion abnormalities. Features are consistent with a pseudonormal left ventricular filling pattern, with concomitant abnormal relaxation and increased filling pressure (grade 2 diastolic dysfunction). Doppler parameters are consistent with high ventricular filling pressure. - Mitral valve: There was mild regurgitation.  Event Monitor: 06/2018  Predominantly sinus rhythm with sinus tachycardia also seen.  21 beat run of ventricular tachycardia, heart rate 189 bpm.  Symptoms correlated with all of the above  Labs/Other Tests and Data Reviewed:    EKG:  No ECG reviewed.  Recent Labs: 08/09/2018: Magnesium 1.7 10/09/2018: ALT 81; BUN 16; Creatinine, Ser  0.79; Hemoglobin 13.8; Platelets 272; Potassium 4.1; Sodium 129   Recent Lipid Panel Lab Results  Component Value Date/Time   CHOL  12/11/2010 04:23 AM    172        ATP III CLASSIFICATION:  <200     mg/dL   Desirable  200-239  mg/dL   Borderline High  >=240    mg/dL   High          TRIG 137 12/11/2010 04:23 AM   HDL 19 (L) 12/11/2010 04:23 AM   CHOLHDL 9.1 12/11/2010 04:23 AM   LDLCALC (H) 12/11/2010 04:23 AM    126        Total Cholesterol/HDL:CHD Risk Coronary Heart Disease Risk Table                     Men   Women  1/2 Average Risk   3.4   3.3  Average Risk       5.0   4.4  2 X Average Risk   9.6   7.1  3 X Average Risk  23.4   11.0        Use the calculated Patient Ratio above and  the CHD Risk Table to determine the patient's CHD Risk.        ATP III CLASSIFICATION (LDL):  <100     mg/dL   Optimal  100-129  mg/dL   Near or Above                    Optimal  130-159  mg/dL   Borderline  160-189  mg/dL   High  >190     mg/dL   Very High    Wt Readings from Last 3 Encounters:  07/26/19 185 lb (83.9 kg)  10/09/18 190 lb (86.2 kg)  09/11/18 190 lb (86.2 kg)     Objective:    Vital Signs:  BP (!) 170/68   Pulse 74   Ht 5\' 3"  (1.6 m)   Wt 185 lb (83.9 kg)   SpO2 98%   BMI 32.77 kg/m    VITAL SIGNS:  reviewed  ASSESSMENT & PLAN:    1.  Palpitations/wide-complex tachycardia: Previous event monitoring reviewed above with 21 beat run of wide-complex tachycardia likely representing SVT with aberrancy as opposed to ventricular tachycardia.  She's had more palpitations lately. I will increase Lopressor to 50 mg twice daily and continue magnesium supplementation.  2.  Hypertension: Blood pressure is markedly elevated. SBP's have been in the 160-170 range.  I will increase losartan to 50 mg daily.  3.  Mitral regurgitation: Mild in severity by echocardiogram in December 2019.  We will continue to monitor.   COVID-19 Education: The signs and symptoms of COVID-19  were discussed with the patient and how to seek care for testing (follow up with PCP or arrange E-visit).  The importance of social distancing was discussed today.  Time:   Today, I have spent 15 minutes with the patient with telehealth technology discussing the above problems.     Medication Adjustments/Labs and Tests Ordered: Current medicines are reviewed at length with the patient today.  Concerns regarding medicines are outlined above.   Tests Ordered: No orders of the defined types were placed in this encounter.   Medication Changes: No orders of the defined types were placed in this encounter.   Follow Up:  Either In Person or Virtual in 4 month(s)  Signed, Kate Sable, MD  07/26/2019 9:51 AM    [place]

## 2019-08-12 ENCOUNTER — Other Ambulatory Visit (HOSPITAL_COMMUNITY): Payer: Self-pay | Admitting: Internal Medicine

## 2019-08-19 ENCOUNTER — Other Ambulatory Visit (HOSPITAL_COMMUNITY): Payer: Self-pay | Admitting: Internal Medicine

## 2019-08-19 DIAGNOSIS — R928 Other abnormal and inconclusive findings on diagnostic imaging of breast: Secondary | ICD-10-CM

## 2019-08-20 ENCOUNTER — Other Ambulatory Visit (HOSPITAL_COMMUNITY): Payer: Self-pay | Admitting: Internal Medicine

## 2019-08-20 DIAGNOSIS — N644 Mastodynia: Secondary | ICD-10-CM

## 2019-08-27 ENCOUNTER — Ambulatory Visit (HOSPITAL_COMMUNITY): Payer: BC Managed Care – PPO

## 2019-08-27 ENCOUNTER — Other Ambulatory Visit: Payer: Self-pay

## 2019-08-27 ENCOUNTER — Ambulatory Visit (HOSPITAL_COMMUNITY): Admission: RE | Admit: 2019-08-27 | Payer: BC Managed Care – PPO | Source: Ambulatory Visit

## 2019-08-27 ENCOUNTER — Ambulatory Visit (HOSPITAL_COMMUNITY)
Admission: RE | Admit: 2019-08-27 | Discharge: 2019-08-27 | Disposition: A | Discharge status: Home / Self Care | Payer: BC Managed Care – PPO | Source: Ambulatory Visit | Attending: Internal Medicine | Admitting: Internal Medicine

## 2019-08-27 DIAGNOSIS — N644 Mastodynia: Secondary | ICD-10-CM | POA: Insufficient documentation

## 2019-09-19 ENCOUNTER — Encounter (HOSPITAL_COMMUNITY): Payer: Self-pay | Admitting: *Deleted

## 2019-09-19 ENCOUNTER — Emergency Department (HOSPITAL_COMMUNITY): Payer: BC Managed Care – PPO

## 2019-09-19 ENCOUNTER — Other Ambulatory Visit: Payer: Self-pay

## 2019-09-19 ENCOUNTER — Emergency Department (HOSPITAL_COMMUNITY)
Admission: EM | Admit: 2019-09-19 | Discharge: 2019-09-20 | Disposition: A | Discharge status: Home / Self Care | Payer: BC Managed Care – PPO | Attending: Emergency Medicine | Admitting: Emergency Medicine

## 2019-09-19 DIAGNOSIS — Y999 Unspecified external cause status: Secondary | ICD-10-CM | POA: Insufficient documentation

## 2019-09-19 DIAGNOSIS — Y929 Unspecified place or not applicable: Secondary | ICD-10-CM | POA: Diagnosis not present

## 2019-09-19 DIAGNOSIS — I10 Essential (primary) hypertension: Secondary | ICD-10-CM | POA: Insufficient documentation

## 2019-09-19 DIAGNOSIS — M25511 Pain in right shoulder: Secondary | ICD-10-CM | POA: Diagnosis not present

## 2019-09-19 DIAGNOSIS — E119 Type 2 diabetes mellitus without complications: Secondary | ICD-10-CM | POA: Insufficient documentation

## 2019-09-19 DIAGNOSIS — W19XXXA Unspecified fall, initial encounter: Secondary | ICD-10-CM

## 2019-09-19 DIAGNOSIS — Z87891 Personal history of nicotine dependence: Secondary | ICD-10-CM | POA: Diagnosis not present

## 2019-09-19 DIAGNOSIS — Y9389 Activity, other specified: Secondary | ICD-10-CM | POA: Diagnosis not present

## 2019-09-19 DIAGNOSIS — W010XXA Fall on same level from slipping, tripping and stumbling without subsequent striking against object, initial encounter: Secondary | ICD-10-CM | POA: Insufficient documentation

## 2019-09-19 HISTORY — DX: Essential (primary) hypertension: I10

## 2019-09-19 MED ORDER — OXYCODONE HCL 5 MG PO TABS
5.0000 mg | ORAL_TABLET | Freq: Once | ORAL | Status: AC
  Administered 2019-09-19: 5 mg via ORAL
  Filled 2019-09-19: qty 1

## 2019-09-19 MED ORDER — METHOCARBAMOL 500 MG PO TABS: 500.0000 mg | ORAL_TABLET | Freq: Three times a day (TID) | ORAL | 0 refills | Status: DC | PRN

## 2019-09-19 NOTE — Discharge Instructions (Addendum)
You were evaluated in the Emergency Department and after careful evaluation, we did not find any emergent condition requiring admission or further testing in the hospital.  Your exam/testing today is overall reassuring.  Your x-rays did not show any broken bones.  Your pain is likely related to muscle strain or spasm from the fall.  We recommend Tylenol and or ibuprofen at home for discomfort.  For more significant pain keeping you from sleeping at night, you can use the Robaxin muscle relaxer provided.  Please use caution as this medication can cause drowsiness.  Please return to the Emergency Department if you experience any worsening of your condition.  We encourage you to follow up with a primary care provider.  Thank you for allowing [place] to be a part of your care.

## 2019-09-19 NOTE — ED Triage Notes (Signed)
Pt fell after tripping over a flower vase in the floor while carrying a box yesterday.  Pt denies hitting her head.  C/o right side pain, limited rom of right arm.

## 2019-09-19 NOTE — ED Provider Notes (Signed)
[facility] Emergency Department Provider Note MRN:  [id number]  Arrival date & time: 09/19/19     Chief Complaint   Fall   History of Present Illness   Sarah [name] is a 59 y.o. year-old female with a history of hypertension, diabetes presenting to the ED with chief complaint of fall.  Explains that she is moving and was moving furniture when she tripped on a box.  She was holding a vase at the time and was trying not to drop it.  She fell onto her right side.  No head trauma, no loss of consciousness, no nausea or vomiting since the fall.  The fall occurred yesterday.  Continued pain to her right shoulder, right lateral neck, right ribs, right lower back.  Pain is moderate in severity, constant, worse with motion.  Denies chest pain, no abdominal pain, no shortness of breath, no bowel or bladder dysfunction, no numbness or weakness.  Review of Systems  A complete 10 system review of systems was obtained and all systems are negative except as noted in the HPI and PMH.   Patient's Health History    Past Medical History:  Diagnosis Date  . Anxiety   . Diabetes mellitus   . Diverticulosis   . GERD (gastroesophageal reflux disease)   . Hypercholesteremia   . Hypertension     Past Surgical History:  Procedure Laterality Date  . ABDOMINAL HYSTERECTOMY     complete  . CARDIAC CATHETERIZATION  2011  . COLONOSCOPY  01/04/2010   left sided transverse diverticula/two diminutive rectal polyps  . COLONOSCOPY  03/28/2012   Procedure: COLONOSCOPY;  Surgeon: Daneil Dolin, MD;  [facility];  Service: Endoscopy;  Laterality: N/A;  9:30  . COLONOSCOPY N/A 09/11/2018   Procedure: COLONOSCOPY;  Surgeon: Aviva Signs, MD;  Location: [facility];  Service: Gastroenterology;  Laterality: N/A;  . FOOT SURGERY     right-tendon repair  . laparoscopic sigmoid colectomy  02/01/10   Dr Jola Babinski  . TRIGGER FINGER RELEASE Left 03/2015  . TRIGGER  FINGER RELEASE Right 02/25/2016   Procedure: RIGHT LONG TRIGGER FINGER RELEASE;  Surgeon: Carole Civil, MD;  Location: [facility];  Service: Orthopedics;  Laterality: Right;    Family History  Problem Relation Age of Onset  . Colon cancer Maternal Grandfather   . Colon polyps Mother   . Diabetes Mother   . Hypertension Mother   . Heart attack Father   . Heart murmur Sister   . Hypertension Brother   . Diabetes Son     Social History   Socioeconomic History  . Marital status: Married    Spouse name: Not on file  . Number of children: 1  . Years of education: Not on file  . Highest education level: Not on file  Occupational History  . Occupation: K Pharmacist, hospital asst    Employer: [employer]  Tobacco Use  . Smoking status: Former Smoker    Packs/day: 1.00    Years: 10.00    Pack years: 10.00    Types: Cigarettes    Quit date: 09/05/2006    Years since quitting: 13.0  . Smokeless tobacco: Never Used  Substance and Sexual Activity  . Alcohol use: Yes    Comment: occasional  . Drug use: No  . Sexual activity: Yes    Birth control/protection: Surgical    Comment: hyst  Other Topics Concern  . Not on file  Social History Narrative   Lives  w/ husband, sister & her children , teen mom & her baby   Social Determinants of Health   Financial Resource Strain:   . Difficulty of Paying Living Expenses: Not on file  Food Insecurity:   . Worried About Charity fundraiser in the Last Year: Not on file  . Ran Out of Food in the Last Year: Not on file  Transportation Needs:   . Lack of Transportation (Medical): Not on file  . Lack of Transportation (Non-Medical): Not on file  Physical Activity:   . Days of Exercise per Week: Not on file  . Minutes of Exercise per Session: Not on file  Stress:   . Feeling of Stress : Not on file  Social Connections:   . Frequency of Communication with Friends and Family: Not on file  . Frequency of Social Gatherings with Friends and Family:  Not on file  . Attends Religious Services: Not on file  . Active Member of Clubs or Organizations: Not on file  . Attends Archivist Meetings: Not on file  . Marital Status: Not on file  Intimate Partner Violence:   . Fear of Current or Ex-Partner: Not on file  . Emotionally Abused: Not on file  . Physically Abused: Not on file  . Sexually Abused: Not on file     Physical Exam   Vitals:   09/19/19 2219  BP: 129/71  Pulse: 79  Resp: 14  Temp: 97.8 F (36.6 C)  SpO2: 100%    CONSTITUTIONAL: Well-appearing, NAD NEURO:  Alert and oriented x 3, no focal deficits EYES:  eyes equal and reactive ENT/NECK:  no LAD, no JVD CARDIO: Regular rate, well-perfused, normal S1 and S2 PULM:  CTAB no wheezing or rhonchi GI/GU:  normal bowel sounds, non-distended, non-tender MSK/SPINE:  No gross deformities, no edema; tenderness to palpation to the right shoulder with limited range of motion due to pain, tender to palpation to the right lateral lumbar back, tender to palpation to the right lateral ribs SKIN:  no rash, atraumatic PSYCH:  Appropriate speech and behavior  *Additional and/or pertinent findings included in MDM below  Diagnostic and Interventional Summary    EKG Interpretation  Date/Time:    Ventricular Rate:    PR Interval:    QRS Duration:   QT Interval:    QTC Calculation:   R Axis:     Text Interpretation:        Cardiac Monitoring Interpretation:  Labs Reviewed - No data to display  DG Shoulder Right    (Results Pending)  DG Ribs Unilateral W/Chest Right    (Results Pending)  DG Lumbar Spine Complete    (Results Pending)    Medications  oxyCODONE (Oxy IR/ROXICODONE) immediate release tablet 5 mg (has no administration in time range)     Procedures  /  Critical Care Procedures  ED Course and Medical Decision Making  I have reviewed the triage vital signs, the nursing notes, and pertinent available records from the EMR.  Pertinent labs & imaging  results that were available during my care of the patient were reviewed by me and considered in my medical decision making (see below for details).     Mechanical fall with right-sided pain, doubt significant intrathoracic or intra-abdominal injury.  Suspect sprain or strain, x-rays to exclude fracture.  With negative imaging, would be appropriate for discharge with pain control.  Signed out to oncoming provider shift change.    Barth Kirks. Sedonia Small, [facility]  [place] [email or web address]  Final Clinical Impressions(s) / ED Diagnoses     ICD-10-CM   1. Acute pain of right shoulder  M25.511   2. Fall, initial encounter  W19.[facility]     ED Discharge Orders         Ordered    methocarbamol (ROBAXIN) 500 MG tablet  Every 8 hours PRN     09/19/19 2233           Discharge Instructions Discussed with and Provided to Patient:     Discharge Instructions     You were evaluated in the Emergency Department and after careful evaluation, we did not find any emergent condition requiring admission or further testing in the hospital.  Your exam/testing today is overall reassuring.  Your x-rays did not show any broken bones.  Your pain is likely related to muscle strain or spasm from the fall.  We recommend Tylenol and or ibuprofen at home for discomfort.  For more significant pain keeping you from sleeping at night, you can use the Robaxin muscle relaxer provided.  Please use caution as this medication can cause drowsiness.  Please return to the Emergency Department if you experience any worsening of your condition.  We encourage you to follow up with a primary care provider.  Thank you for allowing [place] to be a part of your care.       Maudie Flakes, MD 09/19/19 2234

## 2019-09-20 NOTE — ED Provider Notes (Signed)
Care assumed from Dr. Sedonia Small, patient presented with injuries following a fall at home.  She was signed out to me pending x-ray results.  X-rays of the right ribs, right shoulder, lumbar spine are all negative for fracture.  Patient is advised of these findings and advised to apply ice and use over-the-counter analgesics as needed for pain.   Delora Fuel, MD [phone]

## 2019-11-21 ENCOUNTER — Ambulatory Visit: Payer: BC Managed Care – PPO | Admitting: General Surgery

## 2019-11-21 ENCOUNTER — Other Ambulatory Visit: Payer: Self-pay

## 2019-11-21 ENCOUNTER — Encounter: Payer: Self-pay | Admitting: General Surgery

## 2019-11-21 VITALS — BP 140/83 | HR 90 | Temp 98.1°F | Resp 12 | Ht 63.5 in | Wt 187.0 lb

## 2019-11-21 DIAGNOSIS — K429 Umbilical hernia without obstruction or gangrene: Secondary | ICD-10-CM

## 2019-11-21 NOTE — Progress Notes (Signed)
Subjective:     Sarah [name]  Patient returns for a follow-up visit due to recurrent periumbilical pain which radiates to the left lower portion of her abdomen.  She has a known umbilical hernia. Objective:    BP 140/83   Pulse 90   Temp 98.1 F (36.7 C) (Oral)   Resp 12   Ht 5' 3.5" (1.613 m)   Wt 187 lb (84.8 kg)   SpO2 96%   BMI 32.61 kg/m   General:  alert, cooperative and no distress  Head is normocephalic, atraumatic Lungs are clear to auscultation with equal breath sounds bilaterally Heart examination reveals a regular rate and rhythm without S3, S4, murmurs Abdomen is soft with a reducible umbilical hernia present.  This reproduces her abdominal pain when palpated.  All other surgical scars are healed. Reviewed previous CT scan of the abdomen     Assessment:    Umbilical hernia, reducible    Plan:   Patient will call to schedule umbilical herniorrhaphy with mesh.  The risks and benefits of the procedure including bleeding, infection, mesh use, and the possibility of recurrence of the hernia as well as the pain were fully explained to the patient, who gave informed consent.

## 2019-11-21 NOTE — Patient Instructions (Signed)
Ventral Hernia  A ventral hernia is a bulge of tissue from inside the abdomen that pushes through a weak area of the muscles that form the front wall of the abdomen. The tissues inside the abdomen are inside a sac (peritoneum). These tissues include the small intestine, large intestine, and the fatty tissue that covers the intestines (omentum). Sometimes, the bulge that forms a hernia contains intestines. Other hernias contain only fat. Ventral hernias do not go away without surgical treatment. There are several types of ventral hernias. You may have:  A hernia at an incision site from previous abdominal surgery (incisional hernia).  A hernia just above the belly button (epigastric hernia), or at the belly button (umbilical hernia). These types of hernias can develop from heavy lifting or straining.  A hernia that comes and goes (reducible hernia). It may be visible only when you lift or strain. This type of hernia can be pushed back into the abdomen (reduced).  A hernia that traps abdominal tissue inside the hernia (incarcerated hernia). This type of hernia does not reduce.  A hernia that cuts off blood flow to the tissues inside the hernia (strangulated hernia). The tissues can start to die if this happens. This is a very painful bulge that cannot be reduced. A strangulated hernia is a medical emergency. What are the causes? This condition is caused by abdominal tissue putting pressure on an area of weakness in the abdominal muscles. What increases the risk? The following factors may make you more likely to develop this condition:  Being female.  Being 60 or older.  Being overweight or obese.  Having had previous abdominal surgery, especially if there was an infection after surgery.  Having had an injury to the abdominal wall.  Having had several pregnancies.  Having a buildup of fluid inside the abdomen (ascites). What are the signs or symptoms? The only symptom of a ventral hernia  may be a painless bulge in the abdomen. A reducible hernia may be visible only when you strain, cough, or lift. Other symptoms may include:  Dull pain.  A feeling of pressure. Signs and symptoms of a strangulated hernia may include:  Increasing pain.  Nausea and vomiting.  Pain when pressing on the hernia.  The skin over the hernia turning red or purple.  Constipation.  Blood in the stool (feces). How is this diagnosed? This condition may be diagnosed based on:  Your symptoms.  Your medical history.  A physical exam. You may be asked to cough or strain while standing. These actions increase the pressure inside your abdomen and force the hernia through the opening in your muscles. Your health care provider may try to reduce the hernia by pressing on it.  Imaging studies, such as an ultrasound or CT scan. How is this treated? This condition is treated with surgery. If you have a strangulated hernia, surgery is done as soon as possible. If your hernia is small and not incarcerated, you may be asked to lose some weight before surgery. Follow these instructions at home:  Follow instructions from your health care provider about eating or drinking restrictions.  If you are overweight, your health care provider may recommend that you increase your activity level and eat a healthier diet.  Do not lift anything that is heavier than 10 lb (4.5 kg).  Return to your normal activities as told by your health care provider. Ask your health care provider what activities are safe for you. You may need to avoid activities   that increase pressure on your hernia.  Take over-the-counter and prescription medicines only as told by your health care provider.  Keep all follow-up visits as told by your health care provider. This is important. Contact a health care provider if:  Your hernia gets larger.  Your hernia becomes painful. Get help right away if:  Your hernia becomes increasingly  painful.  You have pain along with any of the following: ? Changes in skin color in the area of the hernia. ? Nausea. ? Vomiting. ? Fever. Summary  A ventral hernia is a bulge of tissue from inside the abdomen that pushes through a weak area of the muscles that form the front wall of the abdomen.  This condition is treated with surgery, which may be urgent depending on your hernia.  Do not lift anything that is heavier than 10 lb (4.5 kg), and follow activity instructions from your health care provider. This information is not intended to replace advice given to you by your health care provider. Make sure you discuss any questions you have with your health care provider. Document Revised: 09/20/2017 Document Reviewed: 02/27/2017 Elsevier Patient Education  2020 Elsevier Inc.  

## 2019-12-05 ENCOUNTER — Telehealth (INDEPENDENT_AMBULATORY_CARE_PROVIDER_SITE_OTHER): Payer: BC Managed Care – PPO | Admitting: Cardiovascular Disease

## 2019-12-05 ENCOUNTER — Encounter: Payer: Self-pay | Admitting: Cardiovascular Disease

## 2019-12-05 ENCOUNTER — Other Ambulatory Visit: Payer: Self-pay

## 2019-12-05 VITALS — BP 120/60 | Ht 63.0 in | Wt 185.0 lb

## 2019-12-05 DIAGNOSIS — I10 Essential (primary) hypertension: Secondary | ICD-10-CM

## 2019-12-05 DIAGNOSIS — I340 Nonrheumatic mitral (valve) insufficiency: Secondary | ICD-10-CM

## 2019-12-05 DIAGNOSIS — R002 Palpitations: Secondary | ICD-10-CM

## 2019-12-05 DIAGNOSIS — R000 Tachycardia, unspecified: Secondary | ICD-10-CM

## 2019-12-05 DIAGNOSIS — I472 Ventricular tachycardia: Secondary | ICD-10-CM | POA: Diagnosis not present

## 2019-12-05 NOTE — Progress Notes (Signed)
Virtual Visit via Telephone Note   This visit type was conducted due to national recommendations for restrictions regarding the COVID-19 Pandemic (e.g. social distancing) in an effort to limit this patient's exposure and mitigate transmission in our community.  Due to her co-morbid illnesses, this patient is at least at moderate risk for complications without adequate follow up.  This format is felt to be most appropriate for this patient at this time.  The patient did not have access to video technology/had technical difficulties with video requiring transitioning to audio format only (telephone).  All issues noted in this document were discussed and addressed.  No physical exam could be performed with this format.  Please refer to the patient's chart for her  consent to telehealth for [facility].   The patient was identified using 2 identifiers.  Date:  12/05/2019   ID:  Sarah [name], DOB 1960-11-28, MRN [id number]  Patient Location: Home Provider Location: Office  PCP:  Celene Squibb, MD  Cardiologist:  Kate Sable, MD  Electrophysiologist:  None   Evaluation Performed:  Follow-Up Visit  Chief Complaint: Palpitations  History of Present Illness:    Sarah [name] is a 59 y.o. female with a history of palpitations with previous event monitoring demonstrating a 21 beat run of wide-complex tachycardia.  She is being scheduled undergo umbilical herniorrhaphy with mesh.  She denies chest pain.  She seldom has palpitations.  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   Past Medical History:  Diagnosis Date  . Anxiety   . Diabetes mellitus   . Diverticulosis   . GERD (gastroesophageal reflux disease)   . Hypercholesteremia   . Hypertension    Past Surgical History:  Procedure Laterality Date  . ABDOMINAL HYSTERECTOMY     complete  . CARDIAC CATHETERIZATION  2011  . COLONOSCOPY  01/04/2010   left sided transverse diverticula/two diminutive rectal  polyps  . COLONOSCOPY  03/28/2012   Procedure: COLONOSCOPY;  Surgeon: Daneil Dolin, MD;  [facility];  Service: Endoscopy;  Laterality: N/A;  9:30  . COLONOSCOPY N/A 09/11/2018   Procedure: COLONOSCOPY;  Surgeon: Aviva Signs, MD;  [facility];  Service: Gastroenterology;  Laterality: N/A;  . FOOT SURGERY     right-tendon repair  . laparoscopic sigmoid colectomy  02/01/10   Dr Jola Babinski  . TRIGGER FINGER RELEASE Left 03/2015  . TRIGGER FINGER RELEASE Right 02/25/2016   Procedure: RIGHT LONG TRIGGER FINGER RELEASE;  Surgeon: Carole Civil, MD;  Location: [facility];  Service: Orthopedics;  Laterality: Right;     Current Meds  Medication Sig  . aspirin EC 81 MG tablet Take 81 mg by mouth daily.  Marland Kitchen escitalopram (LEXAPRO) 5 MG tablet Take 5 mg by mouth at bedtime.   . gabapentin (NEURONTIN) 100 MG capsule Take 100 mg by mouth at bedtime.  Marland Kitchen losartan (COZAAR) 25 MG tablet Take 25 mg by mouth daily.  . Magnesium 250 MG TABS Take 250 mg by mouth daily.  . metFORMIN (GLUCOPHAGE-XR) 500 MG 24 hr tablet Take 1,000 mg by mouth 2 (two) times daily.   . methocarbamol (ROBAXIN) 500 MG tablet Take 1 tablet (500 mg total) by mouth every 8 (eight) hours as needed for muscle spasms.  . metoprolol tartrate (LOPRESSOR) 50 MG tablet Take 1 tablet (50 mg total) by mouth 2 (two) times daily.  . Multiple Vitamins-Minerals (MULTIVITAMIN PO) Take 1 tablet by mouth daily.  . RYBELSUS 7 MG TABS Take  14 mg by mouth daily.   . simvastatin (ZOCOR) 20 MG tablet Take 20 mg by mouth 2 (two) times a week.  . TRESIBA FLEXTOUCH 200 UNIT/ML SOPN Inject 50-60 Units into the skin at bedtime.   . [DISCONTINUED] losartan (COZAAR) 50 MG tablet Take 1 tablet (50 mg total) by mouth daily.     Allergies:   Sulfonamide derivatives and Latex   Social History   Tobacco Use  . Smoking status: Former Smoker    Packs/day: 1.00    Years: 10.00    Pack years: 10.00    Types: Cigarettes    Quit  date: 09/05/2006    Years since quitting: 13.2  . Smokeless tobacco: Never Used  Substance Use Topics  . Alcohol use: Yes    Comment: occasional  . Drug use: No     Family Hx: The patient's family history includes Colon cancer in her maternal grandfather; Colon polyps in her mother; Diabetes in her mother and son; Heart attack in her father; Heart murmur in her sister; Hypertension in her brother and mother.  ROS:   Please see the history of present illness.     All other systems reviewed and are negative.   Prior CV studies:   The following studies were reviewed today:  Cardiac Catheterization: 06/2010    NST: 04/2018  No diagnostic ST segment changes to indicate ischemia. Hypertensive blood pressure response. No chest pain was reported. There were no arrhythmias. Low risk Duke treadmill score of 5.5.  Blood pressure demonstrated a hypertensive response to exercise.  Small, mild intensity, apical anterior defect that is partially reversible, most likely reflective of variable breast attenuation. Increased TID ratio 1.27 looks to be due to misregistration of images.  This is a low risk study.  Nuclear stress EF: 74%.  Echocardiogram: 07/2018 Study Conclusions  - Left ventricle: The cavity size was normal. Wall thickness was increased in a pattern of mild LVH. Systolic function was normal. The estimated ejection fraction was in the range of 60% to 65%. Wall motion was normal; there were no regional wall motion abnormalities. Features are consistent with a pseudonormal left ventricular filling pattern, with concomitant abnormal relaxation and increased filling pressure (grade 2 diastolic dysfunction). Doppler parameters are consistent with high ventricular filling pressure. - Mitral valve: There was mild regurgitation.  Event Monitor: 06/2018  Predominantly sinus rhythm with sinus tachycardia also seen.  21 beat run of ventricular tachycardia,  heart rate 189 bpm.  Symptoms correlated with all of the above  Labs/Other Tests and Data Reviewed:    EKG:  No ECG reviewed.  Recent Labs: No results found for requested labs within last 8760 hours.   Recent Lipid Panel Lab Results  Component Value Date/Time   CHOL  12/11/2010 04:23 AM    172        ATP III CLASSIFICATION:  <200     mg/dL   Desirable  200-239  mg/dL   Borderline High  >=240    mg/dL   High          TRIG 137 12/11/2010 04:23 AM   HDL 19 (L) 12/11/2010 04:23 AM   CHOLHDL 9.1 12/11/2010 04:23 AM   LDLCALC (H) 12/11/2010 04:23 AM    126        Total Cholesterol/HDL:CHD Risk Coronary Heart Disease Risk Table                     Men   Women  1/2 Average Risk  3.4   3.3  Average Risk       5.0   4.4  2 X Average Risk   9.6   7.1  3 X Average Risk  23.4   11.0        Use the calculated Patient Ratio above and the CHD Risk Table to determine the patient's CHD Risk.        ATP III CLASSIFICATION (LDL):  <100     mg/dL   Optimal  100-129  mg/dL   Near or Above                    Optimal  130-159  mg/dL   Borderline  160-189  mg/dL   High  >190     mg/dL   Very High    Wt Readings from Last 3 Encounters:  12/05/19 185 lb (83.9 kg)  11/21/19 187 lb (84.8 kg)  09/19/19 188 lb (85.3 kg)     Objective:    Vital Signs:  BP 120/60   Ht 5\' 3"  (1.6 m)   Wt 185 lb (83.9 kg)   BMI 32.77 kg/m    VITAL SIGNS:  reviewed  ASSESSMENT & PLAN:    1.  Palpitations/wide-complex tachycardia: Previous event monitoring reviewed above with 21 beat run of wide-complex tachycardia likely representing SVT with aberrancy as opposed to ventricular tachycardia.    Symptomatically improved on Lopressor 50 mg twice daily.  I will continue magnesium supplementation.  2.  Hypertension: Blood pressure is normal. No changes.  3.  Mitral regurgitation: Mild in severity by echocardiogram in December 2019.  I will continue to monitor.    COVID-19 Education: The signs and  symptoms of COVID-19 were discussed with the patient and how to seek care for testing (follow up with PCP or arrange E-visit).  The importance of social distancing was discussed today.  Time:   Today, I have spent 15 minutes with the patient with telehealth technology discussing the above problems.     Medication Adjustments/Labs and Tests Ordered: Current medicines are reviewed at length with the patient today.  Concerns regarding medicines are outlined above.   Tests Ordered: No orders of the defined types were placed in this encounter.   Medication Changes: No orders of the defined types were placed in this encounter.   Follow Up:  Virtual Visit  in 6 month(s)  Signed, Kate Sable, MD  12/05/2019 9:38 AM    [place]

## 2019-12-05 NOTE — Patient Instructions (Signed)
Medication Instructions:  Your physician recommends that you continue on your current medications as directed. Please refer to the Current Medication list given to you today.  *If you need a refill on your cardiac medications before your next appointment, please call your pharmacy*   Lab Work: None today  If you have labs (blood work) drawn today and your tests are completely normal, you will receive your results only by: . MyChart Message (if you have MyChart) OR . A paper copy in the mail If you have any lab test that is abnormal or we need to change your treatment, we will call you to review the results.   Testing/Procedures: None today    Follow-Up: At [facility], you and your health needs are our priority.  As part of our continuing mission to provide you with exceptional heart care, we have created designated Provider Care Teams.  These Care Teams include your primary Cardiologist (physician) and Advanced Practice Providers (APPs -  Physician Assistants and Nurse Practitioners) who all work together to provide you with the care you need, when you need it.  We recommend signing up for the patient portal called "MyChart".  Sign up information is provided on this After Visit Summary.  MyChart is used to connect with patients for Virtual Visits (Telemedicine).  Patients are able to view lab/test results, encounter notes, upcoming appointments, etc.  Non-urgent messages can be sent to your provider as well.   To learn more about what you can do with MyChart, go to [email or web address].    Your next appointment:   6 month(s)  The format for your next appointment:   Virtual Visit   Provider:   Suresh Koneswaran, MD   Other Instructions None      Thank you for choosing [facility] !         

## 2020-01-31 NOTE — Patient Instructions (Signed)
Sarah [name]  01/31/2020     @PREFPERIOPPHARMACY @   Your procedure is scheduled on  02/05/2020 .  Report to [facility] at  [phone]  A.M.  Call this number if you have problems the morning of surgery:  [phone]   Remember:  Do not eat or drink after midnight.                        Take these medicines the morning of surgery with A SIP OF WATER  Ativan(if needed), metoprolol. DO NOT take any medications for diabetes the morning of your procedure.    Do not wear jewelry, make-up or nail polish.  Do not wear lotions, powders, or perfumes. Please wear deodorant and brush your teeth.  Do not shave 48 hours prior to surgery.  Men may shave face and neck.  Do not bring valuables to the hospital.  [facility] is not responsible for any belongings or valuables.  Contacts, dentures or bridgework may not be worn into surgery.  Leave your suitcase in the car.  After surgery it may be brought to your room.  For patients admitted to the hospital, discharge time will be determined by your treatment team.  Patients discharged the day of surgery will not be allowed to drive home.   Name and phone number of your driver:   family Special instructions:  DO NOT smoke the morning of your procedure.  Please read over the following fact sheets that you were given. Anesthesia Post-op Instructions and Care and Recovery After Surgery       Open Hernia Repair, Adult, Care After These instructions give you information about caring for yourself after your procedure. Your doctor may also give you more specific instructions. If you have problems or questions, contact your doctor. Follow these instructions at home: Surgical cut (incision) care   Follow instructions from your doctor about how to take care of your surgical cut area. Make sure you: ? Wash your hands with soap and water before you change your bandage (dressing). If you cannot use soap and water, use hand  sanitizer. ? Change your bandage as told by your doctor. ? Leave stitches (sutures), skin glue, or skin tape (adhesive) strips in place. They may need to stay in place for 2 weeks or longer. If tape strips get loose and curl up, you may trim the loose edges. Do not remove tape strips completely unless your doctor says it is okay.  Check your surgical cut every day for signs of infection. Check for: ? More redness, swelling, or pain. ? More fluid or blood. ? Warmth. ? Pus or a bad smell. Activity  Do not drive or use heavy machinery while taking prescription pain medicine. Do not drive until your doctor says it is okay.  Until your doctor says it is okay: ? Do not lift anything that is heavier than 10 lb (4.5 kg). ? Do not play contact sports.  Return to your normal activities as told by your doctor. Ask your doctor what activities are safe. General instructions  To prevent or treat having a hard time pooping (constipation) while you are taking prescription pain medicine, your doctor may recommend that you: ? Drink enough fluid to keep your pee (urine) clear or pale yellow. ? Take over-the-counter or prescription medicines. ? Eat foods that are high in fiber, such as fresh fruits and vegetables, whole grains, and beans. ? Limit  foods that are high in fat and processed sugars, such as fried and sweet foods.  Take over-the-counter and prescription medicines only as told by your doctor.  Do not take baths, swim, or use a hot tub until your doctor says it is okay.  Keep all follow-up visits as told by your doctor. This is important. Contact a doctor if:  You develop a rash.  You have more redness, swelling, or pain around your surgical cut.  You have more fluid or blood coming from your surgical cut.  Your surgical cut feels warm to the touch.  You have pus or a bad smell coming from your surgical cut.  You have a fever or chills.  You have blood in your poop (stool).  You  have not pooped in 2-3 days.  Medicine does not help your pain. Get help right away if:  You have chest pain or you are short of breath.  You feel light-headed.  You feel weak and dizzy (feel faint).  You have very bad pain.  You throw up (vomit) and your pain is worse. This information is not intended to replace advice given to you by your health care provider. Make sure you discuss any questions you have with your health care provider. Document Revised: 11/30/2018 Document Reviewed: 01/20/2016 Elsevier Patient Education  2020 [place] Anesthesia, Adult, Care After This sheet gives you information about how to care for yourself after your procedure. Your health care provider may also give you more specific instructions. If you have problems or questions, contact your health care provider. What can I expect after the procedure? After the procedure, the following side effects are common:  Pain or discomfort at the IV site.  Nausea.  Vomiting.  Sore throat.  Trouble concentrating.  Feeling cold or chills.  Weak or tired.  Sleepiness and fatigue.  Soreness and body aches. These side effects can affect parts of the body that were not involved in surgery. Follow these instructions at home:  For at least 24 hours after the procedure:  Have a responsible adult stay with you. It is important to have someone help care for you until you are awake and alert.  Rest as needed.  Do not: ? Participate in activities in which you could fall or become injured. ? Drive. ? Use heavy machinery. ? Drink alcohol. ? Take sleeping pills or medicines that cause drowsiness. ? Make important decisions or sign legal documents. ? Take care of children on your own. Eating and drinking  Follow any instructions from your health care provider about eating or drinking restrictions.  When you feel hungry, start by eating small amounts of foods that are soft and easy to digest  (bland), such as toast. Gradually return to your regular diet.  Drink enough fluid to keep your urine pale yellow.  If you vomit, rehydrate by drinking water, juice, or clear broth. General instructions  If you have sleep apnea, surgery and certain medicines can increase your risk for breathing problems. Follow instructions from your health care provider about wearing your sleep device: ? Anytime you are sleeping, including during daytime naps. ? While taking prescription pain medicines, sleeping medicines, or medicines that make you drowsy.  Return to your normal activities as told by your health care provider. Ask your health care provider what activities are safe for you.  Take over-the-counter and prescription medicines only as told by your health care provider.  If you smoke, do not smoke without supervision.  Keep all follow-up visits as told by your health care provider. This is important. Contact a health care provider if:  You have nausea or vomiting that does not get better with medicine.  You cannot eat or drink without vomiting.  You have pain that does not get better with medicine.  You are unable to pass urine.  You develop a skin rash.  You have a fever.  You have redness around your IV site that gets worse. Get help right away if:  You have difficulty breathing.  You have chest pain.  You have blood in your urine or stool, or you vomit blood. Summary  After the procedure, it is common to have a sore throat or nausea. It is also common to feel tired.  Have a responsible adult stay with you for the first 24 hours after general anesthesia. It is important to have someone help care for you until you are awake and alert.  When you feel hungry, start by eating small amounts of foods that are soft and easy to digest (bland), such as toast. Gradually return to your regular diet.  Drink enough fluid to keep your urine pale yellow.  Return to your normal  activities as told by your health care provider. Ask your health care provider what activities are safe for you. This information is not intended to replace advice given to you by your health care provider. Make sure you discuss any questions you have with your health care provider. Document Revised: 08/11/2017 Document Reviewed: 03/24/2017 Elsevier Patient Education  [place]. How to Use Chlorhexidine for Bathing Chlorhexidine gluconate (CHG) is a germ-killing (antiseptic) solution that is used to clean the skin. It can get rid of the bacteria that normally live on the skin and can keep them away for about 24 hours. To clean your skin with CHG, you may be given:  A CHG solution to use in the shower or as part of a sponge bath.  A prepackaged cloth that contains CHG. Cleaning your skin with CHG may help lower the risk for infection:  While you are staying in the intensive care unit of the hospital.  If you have a vascular access, such as a central line, to provide short-term or long-term access to your veins.  If you have a catheter to drain urine from your bladder.  If you are on a ventilator. A ventilator is a machine that helps you breathe by moving air in and out of your lungs.  After surgery. What are the risks? Risks of using CHG include:  A skin reaction.  Hearing loss, if CHG gets in your ears.  Eye injury, if CHG gets in your eyes and is not rinsed out.  The CHG product catching fire. Make sure that you avoid smoking and flames after applying CHG to your skin. Do not use CHG:  If you have a chlorhexidine allergy or have previously reacted to chlorhexidine.  On babies younger than 25 months of age. How to use CHG solution  Use CHG only as told by your health care provider, and follow the instructions on the label.  Use the full amount of CHG as directed. Usually, this is one bottle. During a shower Follow these steps when using CHG solution during a shower  (unless your health care provider gives you different instructions): 1. Start the shower. 2. Use your normal soap and shampoo to wash your face and hair. 3. Turn off the shower or move out of the  shower stream. 4. Pour the CHG onto a clean washcloth. Do not use any type of brush or rough-edged sponge. 5. Starting at your neck, lather your body down to your toes. Make sure you follow these instructions: ? If you will be having surgery, pay special attention to the part of your body where you will be having surgery. Scrub this area for at least 1 minute. ? Do not use CHG on your head or face. If the solution gets into your ears or eyes, rinse them well with water. ? Avoid your genital area. ? Avoid any areas of skin that have broken skin, cuts, or scrapes. ? Scrub your back and under your arms. Make sure to wash skin folds. 6. Let the lather sit on your skin for 1-2 minutes or as long as told by your health care provider. 7. Thoroughly rinse your entire body in the shower. Make sure that all body creases and crevices are rinsed well. 8. Dry off with a clean towel. Do not put any substances on your body afterward--such as powder, lotion, or perfume--unless you are told to do so by your health care provider. Only use lotions that are recommended by the manufacturer. 9. Put on clean clothes or pajamas. 10. If it is the night before your surgery, sleep in clean sheets.  During a sponge bath Follow these steps when using CHG solution during a sponge bath (unless your health care provider gives you different instructions): 1. Use your normal soap and shampoo to wash your face and hair. 2. Pour the CHG onto a clean washcloth. 3. Starting at your neck, lather your body down to your toes. Make sure you follow these instructions: ? If you will be having surgery, pay special attention to the part of your body where you will be having surgery. Scrub this area for at least 1 minute. ? Do not use CHG on your  head or face. If the solution gets into your ears or eyes, rinse them well with water. ? Avoid your genital area. ? Avoid any areas of skin that have broken skin, cuts, or scrapes. ? Scrub your back and under your arms. Make sure to wash skin folds. 4. Let the lather sit on your skin for 1-2 minutes or as long as told by your health care provider. 5. Using a different clean, wet washcloth, thoroughly rinse your entire body. Make sure that all body creases and crevices are rinsed well. 6. Dry off with a clean towel. Do not put any substances on your body afterward--such as powder, lotion, or perfume--unless you are told to do so by your health care provider. Only use lotions that are recommended by the manufacturer. 7. Put on clean clothes or pajamas. 8. If it is the night before your surgery, sleep in clean sheets. How to use CHG prepackaged cloths  Only use CHG cloths as told by your health care provider, and follow the instructions on the label.  Use the CHG cloth on clean, dry skin.  Do not use the CHG cloth on your head or face unless your health care provider tells you to.  When washing with the CHG cloth: ? Avoid your genital area. ? Avoid any areas of skin that have broken skin, cuts, or scrapes. Before surgery Follow these steps when using a CHG cloth to clean before surgery (unless your health care provider gives you different instructions): 1. Using the CHG cloth, vigorously scrub the part of your body where you will be  having surgery. Scrub using a back-and-forth motion for 3 minutes. The area on your body should be completely wet with CHG when you are done scrubbing. 2. Do not rinse. Discard the cloth and let the area air-dry. Do not put any substances on the area afterward, such as powder, lotion, or perfume. 3. Put on clean clothes or pajamas. 4. If it is the night before your surgery, sleep in clean sheets.  For general bathing Follow these steps when using CHG cloths for  general bathing (unless your health care provider gives you different instructions). 1. Use a separate CHG cloth for each area of your body. Make sure you wash between any folds of skin and between your fingers and toes. Wash your body in the following order, switching to a new cloth after each step: ? The front of your neck, shoulders, and chest. ? Both of your arms, under your arms, and your hands. ? Your stomach and groin area, avoiding the genitals. ? Your right leg and foot. ? Your left leg and foot. ? The back of your neck, your back, and your buttocks. 2. Do not rinse. Discard the cloth and let the area air-dry. Do not put any substances on your body afterward--such as powder, lotion, or perfume--unless you are told to do so by your health care provider. Only use lotions that are recommended by the manufacturer. 3. Put on clean clothes or pajamas. Contact a health care provider if:  Your skin gets irritated after scrubbing.  You have questions about using your solution or cloth. Get help right away if:  Your eyes become very red or swollen.  Your eyes itch badly.  Your skin itches badly and is red or swollen.  Your hearing changes.  You have trouble seeing.  You have swelling or tingling in your mouth or throat.  You have trouble breathing.  You swallow any chlorhexidine. Summary  Chlorhexidine gluconate (CHG) is a germ-killing (antiseptic) solution that is used to clean the skin. Cleaning your skin with CHG may help to lower your risk for infection.  You may be given CHG to use for bathing. It may be in a bottle or in a prepackaged cloth to use on your skin. Carefully follow your health care provider's instructions and the instructions on the product label.  Do not use CHG if you have a chlorhexidine allergy.  Contact your health care provider if your skin gets irritated after scrubbing. This information is not intended to replace advice given to you by your health  care provider. Make sure you discuss any questions you have with your health care provider. Document Revised: 10/25/2018 Document Reviewed: 07/06/2017 Elsevier Patient Education  [place].

## 2020-02-01 NOTE — H&P (Signed)
Sarah [name] is an 59 y.o. female.   Chief Complaint: Umbilical hernia HPI: Patient is a 59yo BF who was referred to my care for evaluation and treatment of an umbilical hernia.  Has been present for some time.  Pain made worse with straining. Recently seen by Cardiology and treated for SVT.    Past Medical History:  Diagnosis Date  . Anxiety   . Diabetes mellitus   . Diverticulosis   . GERD (gastroesophageal reflux disease)   . Hypercholesteremia   . Hypertension     Past Surgical History:  Procedure Laterality Date  . ABDOMINAL HYSTERECTOMY     complete  . CARDIAC CATHETERIZATION  2011  . COLONOSCOPY  01/04/2010   left sided transverse diverticula/two diminutive rectal polyps  . COLONOSCOPY  03/28/2012   Procedure: COLONOSCOPY;  Surgeon: Daneil Dolin, MD;  Location: [facility];  Service: Endoscopy;  Laterality: N/A;  9:30  . COLONOSCOPY N/A 09/11/2018   Procedure: COLONOSCOPY;  Surgeon: Aviva Signs, MD;  Location: [facility];  Service: Gastroenterology;  Laterality: N/A;  . FOOT SURGERY     right-tendon repair  . laparoscopic sigmoid colectomy  02/01/10   Dr Jola Babinski  . TRIGGER FINGER RELEASE Left 03/2015  . TRIGGER FINGER RELEASE Right 02/25/2016   Procedure: RIGHT LONG TRIGGER FINGER RELEASE;  Surgeon: Carole Civil, MD;  Location: [facility] OR[facility];  Service: Orthopedics;  Laterality: Right;    Family History  Problem Relation Age of Onset  . Colon cancer Maternal Grandfather   . Colon polyps Mother   . Diabetes Mother   . Hypertension Mother   . Heart attack Father   . Heart murmur Sister   . Hypertension Brother   . Diabetes Son    Social History:  reports that she quit smoking about 13 years ago. Her smoking use included cigarettes. She has a 10.00 pack-year smoking history. She has never used smokeless tobacco. She reports current alcohol use. She reports that she does not use drugs.  Allergies:  Allergies  Allergen Reactions  . Sulfonamide  Derivatives Itching  . Latex Itching, Rash and Other (See Comments)    Powdered only    No medications prior to admission.    No results found for this or any previous visit (from the past 48 hour(s)). No results found.  Review of Systems  Constitutional: Negative.   HENT: Negative.   Eyes: Negative.   Respiratory: Negative.   Cardiovascular: Positive for palpitations.  Gastrointestinal: Positive for abdominal pain.  Endocrine: Negative.   Genitourinary: Negative.   Musculoskeletal: Negative.   Skin: Negative.   Allergic/Immunologic: Negative.   Neurological: Negative.   Hematological: Negative.   Psychiatric/Behavioral: Negative.     There were no vitals taken for this visit. Physical Exam  Vitals reviewed. Constitutional: She is oriented to person, place, and time. She does not appear ill.  HENT:  Head: Normocephalic and atraumatic.  Cardiovascular: Normal rate, regular rhythm and normal heart sounds. Exam reveals no gallop and no friction rub.  No murmur heard. Respiratory: Effort normal and breath sounds normal. No stridor. No respiratory distress. She has no wheezes. She has no rhonchi. She has no rales.  GI: Soft. Normal appearance. She exhibits no distension and no mass. There is no abdominal tenderness. There is no rebound and no guarding. A hernia is present.  Umbilical hernia present, reducible.  Neurological: She is alert and oriented to person, place, and time.  Skin: Skin is warm and dry.  Assessment/Plan Imp:  Umbilical hernia Plan:  Schedule for umbilical herniorrhaphy with mesh on 02/05/20.  The risks and benefits of the procedure including bleeding, infection, mesh use, and the possibility of an open procedure were fully explained to the patient, who gives informed consent.  Aviva Signs, MD 02/01/2020, 6:23 AM

## 2020-02-03 ENCOUNTER — Other Ambulatory Visit: Payer: Self-pay

## 2020-02-03 ENCOUNTER — Encounter (HOSPITAL_COMMUNITY)
Admission: RE | Admit: 2020-02-03 | Discharge: 2020-02-03 | Disposition: A | Discharge status: Home / Self Care | Payer: BC Managed Care – PPO | Source: Ambulatory Visit | Attending: General Surgery | Admitting: General Surgery

## 2020-02-03 ENCOUNTER — Encounter (HOSPITAL_COMMUNITY): Payer: Self-pay

## 2020-02-03 ENCOUNTER — Other Ambulatory Visit (HOSPITAL_COMMUNITY)
Admission: RE | Admit: 2020-02-03 | Discharge: 2020-02-03 | Disposition: A | Discharge status: Home / Self Care | Payer: BC Managed Care – PPO | Source: Ambulatory Visit | Attending: General Surgery | Admitting: General Surgery

## 2020-02-03 DIAGNOSIS — Z01818 Encounter for other preprocedural examination: Secondary | ICD-10-CM | POA: Diagnosis not present

## 2020-02-03 DIAGNOSIS — Z20822 Contact with and (suspected) exposure to covid-19: Secondary | ICD-10-CM | POA: Insufficient documentation

## 2020-02-03 LAB — CBC WITH DIFFERENTIAL/PLATELET
Abs Immature Granulocytes: 0.03 10*3/uL (ref 0.00–0.07)
Basophils Absolute: 0 10*3/uL (ref 0.0–0.1)
Basophils Relative: 1 %
Eosinophils Absolute: 0.1 10*3/uL (ref 0.0–0.5)
Eosinophils Relative: 2 %
HCT: 38.2 % (ref 36.0–46.0)
Hemoglobin: 13.3 g/dL (ref 12.0–15.0)
Immature Granulocytes: 0 %
Lymphocytes Relative: 43 %
Lymphs Abs: 3.2 10*3/uL (ref 0.7–4.0)
MCH: 28.7 pg (ref 26.0–34.0)
MCHC: 34.8 g/dL (ref 30.0–36.0)
MCV: 82.3 fL (ref 80.0–100.0)
Monocytes Absolute: 0.5 10*3/uL (ref 0.1–1.0)
Monocytes Relative: 7 %
Neutro Abs: 3.5 10*3/uL (ref 1.7–7.7)
Neutrophils Relative %: 47 %
Platelets: 341 10*3/uL (ref 150–400)
RBC: 4.64 MIL/uL (ref 3.87–5.11)
RDW: 14.6 % (ref 11.5–15.5)
WBC: 7.4 10*3/uL (ref 4.0–10.5)
nRBC: 0 % (ref 0.0–0.2)

## 2020-02-03 LAB — BASIC METABOLIC PANEL
Anion gap: 7 (ref 5–15)
BUN: 14 mg/dL (ref 8–23)
CO2: 28 mmol/L (ref 22–32)
Calcium: 8.8 mg/dL — ABNORMAL LOW (ref 8.9–10.3)
Chloride: 102 mmol/L (ref 98–111)
Creatinine, Ser: 0.52 mg/dL (ref 0.44–1.00)
GFR calc Af Amer: >60 mL/min (ref >60)
GFR calc non Af Amer: >60 mL/min (ref >60)
Glucose, Bld: 225 mg/dL — ABNORMAL HIGH (ref 70–99)
Potassium: 4.1 mmol/L (ref 3.5–5.1)
Sodium: 137 mmol/L (ref 135–145)

## 2020-02-03 LAB — HEMOGLOBIN A1C
Hgb A1c MFr Bld: 9.4 % — ABNORMAL HIGH (ref 4.8–5.6)
Mean Plasma Glucose: 223.08 mg/dL

## 2020-02-04 LAB — SARS CORONAVIRUS 2 (TAT 6-24 HRS): SARS Coronavirus 2: NEGATIVE

## 2020-02-04 NOTE — Pre-Procedure Instructions (Signed)
HgbA1c routed to PCP. 

## 2020-02-05 ENCOUNTER — Ambulatory Visit (HOSPITAL_COMMUNITY): Payer: BC Managed Care – PPO | Admitting: Certified Registered"

## 2020-02-05 ENCOUNTER — Ambulatory Visit (HOSPITAL_COMMUNITY)
Admission: RE | Admit: 2020-02-05 | Discharge: 2020-02-05 | Disposition: A | Discharge status: Home / Self Care | Payer: BC Managed Care – PPO | Attending: General Surgery | Admitting: General Surgery

## 2020-02-05 ENCOUNTER — Encounter (HOSPITAL_COMMUNITY): Payer: Self-pay | Admitting: General Surgery

## 2020-02-05 ENCOUNTER — Encounter (HOSPITAL_COMMUNITY)
Admission: RE | Disposition: A | Discharge status: Home / Self Care | Payer: Self-pay | Source: Home / Self Care | Attending: General Surgery

## 2020-02-05 ENCOUNTER — Other Ambulatory Visit: Payer: Self-pay

## 2020-02-05 DIAGNOSIS — Z882 Allergy status to sulfonamides status: Secondary | ICD-10-CM | POA: Insufficient documentation

## 2020-02-05 DIAGNOSIS — E119 Type 2 diabetes mellitus without complications: Secondary | ICD-10-CM | POA: Diagnosis not present

## 2020-02-05 DIAGNOSIS — Z87891 Personal history of nicotine dependence: Secondary | ICD-10-CM | POA: Insufficient documentation

## 2020-02-05 DIAGNOSIS — Z91040 Latex allergy status: Secondary | ICD-10-CM | POA: Insufficient documentation

## 2020-02-05 DIAGNOSIS — I471 Supraventricular tachycardia: Secondary | ICD-10-CM | POA: Insufficient documentation

## 2020-02-05 DIAGNOSIS — K429 Umbilical hernia without obstruction or gangrene: Secondary | ICD-10-CM | POA: Insufficient documentation

## 2020-02-05 HISTORY — PX: UMBILICAL HERNIA REPAIR: SHX196

## 2020-02-05 LAB — GLUCOSE, CAPILLARY
Glucose-Capillary: 216 mg/dL — ABNORMAL HIGH (ref 70–99)
Glucose-Capillary: 251 mg/dL — ABNORMAL HIGH (ref 70–99)

## 2020-02-05 SURGERY — REPAIR, HERNIA, UMBILICAL, ADULT
Anesthesia: General

## 2020-02-05 MED ORDER — LIDOCAINE HCL (CARDIAC) PF 50 MG/5ML IV SOSY
PREFILLED_SYRINGE | INTRAVENOUS | Status: DC | PRN
  Administered 2020-02-05: 100 mg via INTRAVENOUS

## 2020-02-05 MED ORDER — ONDANSETRON HCL 4 MG/2ML IJ SOLN
INTRAMUSCULAR | Status: DC | PRN
  Administered 2020-02-05: 4 mg via INTRAVENOUS

## 2020-02-05 MED ORDER — DEXAMETHASONE SODIUM PHOSPHATE 10 MG/ML IJ SOLN
INTRAMUSCULAR | Status: DC | PRN
  Administered 2020-02-05: 5 mg via INTRAVENOUS

## 2020-02-05 MED ORDER — MIDAZOLAM HCL 2 MG/2ML IJ SOLN
INTRAMUSCULAR | Status: DC | PRN
  Administered 2020-02-05: 2 mg via INTRAVENOUS

## 2020-02-05 MED ORDER — GLYCOPYRROLATE 0.2 MG/ML IJ SOLN
INTRAMUSCULAR | Status: DC | PRN
  Administered 2020-02-05: .2 mg via INTRAVENOUS

## 2020-02-05 MED ORDER — ONDANSETRON HCL 4 MG/2ML IJ SOLN
INTRAMUSCULAR | Status: AC
  Filled 2020-02-05: qty 2

## 2020-02-05 MED ORDER — GLYCOPYRROLATE PF 0.2 MG/ML IJ SOSY
PREFILLED_SYRINGE | INTRAMUSCULAR | Status: AC
  Filled 2020-02-05: qty 1

## 2020-02-05 MED ORDER — BUPIVACAINE LIPOSOME 1.3 % IJ SUSP
INTRAMUSCULAR | Status: AC
  Filled 2020-02-05: qty 20

## 2020-02-05 MED ORDER — SODIUM CHLORIDE 0.9 % IR SOLN
Status: DC | PRN
  Administered 2020-02-05: 1000 mL

## 2020-02-05 MED ORDER — ORAL CARE MOUTH RINSE: 15.0000 mL | Freq: Once | OROMUCOSAL | Status: AC

## 2020-02-05 MED ORDER — DEXAMETHASONE SODIUM PHOSPHATE 10 MG/ML IJ SOLN
INTRAMUSCULAR | Status: AC
  Filled 2020-02-05: qty 1

## 2020-02-05 MED ORDER — MIDAZOLAM HCL 2 MG/2ML IJ SOLN
INTRAMUSCULAR | Status: AC
  Filled 2020-02-05: qty 2

## 2020-02-05 MED ORDER — KETOROLAC TROMETHAMINE 30 MG/ML IJ SOLN
INTRAMUSCULAR | Status: DC | PRN
Start: 2020-02-05 — End: 2020-02-05
  Administered 2020-02-05: 30 mg via INTRAVENOUS

## 2020-02-05 MED ORDER — KETOROLAC TROMETHAMINE 30 MG/ML IJ SOLN
INTRAMUSCULAR | Status: AC
  Filled 2020-02-05: qty 1

## 2020-02-05 MED ORDER — CEFAZOLIN SODIUM-DEXTROSE 2-4 GM/100ML-% IV SOLN
2.0000 g | INTRAVENOUS | Status: AC
  Administered 2020-02-05: 2 g via INTRAVENOUS
  Filled 2020-02-05: qty 100

## 2020-02-05 MED ORDER — FENTANYL CITRATE (PF) 100 MCG/2ML IJ SOLN
25.0000 ug | INTRAMUSCULAR | Status: DC | PRN
  Administered 2020-02-05 (×2): 50 ug via INTRAVENOUS
  Filled 2020-02-05: qty 2

## 2020-02-05 MED ORDER — LIDOCAINE 2% (20 MG/ML) 5 ML SYRINGE
INTRAMUSCULAR | Status: AC
  Filled 2020-02-05: qty 5

## 2020-02-05 MED ORDER — CHLORHEXIDINE GLUCONATE CLOTH 2 % EX PADS: 6.0000 | MEDICATED_PAD | Freq: Once | CUTANEOUS | Status: DC

## 2020-02-05 MED ORDER — HYDROCODONE-ACETAMINOPHEN 5-325 MG PO TABS: 1.0000 | ORAL_TABLET | Freq: Four times a day (QID) | ORAL | 0 refills | Status: DC | PRN

## 2020-02-05 MED ORDER — ONDANSETRON HCL 4 MG/2ML IJ SOLN: 4.0000 mg | Freq: Once | INTRAMUSCULAR | Status: DC | PRN

## 2020-02-05 MED ORDER — SUGAMMADEX SODIUM 500 MG/5ML IV SOLN
INTRAVENOUS | Status: DC | PRN
  Administered 2020-02-05: 200 mg via INTRAVENOUS

## 2020-02-05 MED ORDER — SUCCINYLCHOLINE CHLORIDE 200 MG/10ML IV SOSY
PREFILLED_SYRINGE | INTRAVENOUS | Status: AC
  Filled 2020-02-05: qty 10

## 2020-02-05 MED ORDER — SUCCINYLCHOLINE CHLORIDE 20 MG/ML IJ SOLN
INTRAMUSCULAR | Status: DC | PRN
  Administered 2020-02-05: 140 mg via INTRAVENOUS

## 2020-02-05 MED ORDER — FENTANYL CITRATE (PF) 100 MCG/2ML IJ SOLN
INTRAMUSCULAR | Status: AC
  Filled 2020-02-05: qty 2

## 2020-02-05 MED ORDER — PROPOFOL 10 MG/ML IV BOLUS
INTRAVENOUS | Status: DC | PRN
  Administered 2020-02-05: 150 mg via INTRAVENOUS

## 2020-02-05 MED ORDER — PROPOFOL 10 MG/ML IV BOLUS
INTRAVENOUS | Status: AC
  Filled 2020-02-05: qty 20

## 2020-02-05 MED ORDER — LACTATED RINGERS IV SOLN: INTRAVENOUS | Status: DC

## 2020-02-05 MED ORDER — BUPIVACAINE LIPOSOME 1.3 % IJ SUSP
INTRAMUSCULAR | Status: DC | PRN
  Administered 2020-02-05: 20 mL

## 2020-02-05 MED ORDER — ROCURONIUM BROMIDE 100 MG/10ML IV SOLN
INTRAVENOUS | Status: DC | PRN
  Administered 2020-02-05: 30 mg via INTRAVENOUS

## 2020-02-05 MED ORDER — FENTANYL CITRATE (PF) 100 MCG/2ML IJ SOLN
INTRAMUSCULAR | Status: DC | PRN
  Administered 2020-02-05: 100 ug via INTRAVENOUS

## 2020-02-05 MED ORDER — ROCURONIUM BROMIDE 10 MG/ML (PF) SYRINGE
PREFILLED_SYRINGE | INTRAVENOUS | Status: AC
  Filled 2020-02-05: qty 10

## 2020-02-05 MED ORDER — CHLORHEXIDINE GLUCONATE 0.12 % MT SOLN
15.0000 mL | Freq: Once | OROMUCOSAL | Status: AC
  Administered 2020-02-05: 15 mL via OROMUCOSAL

## 2020-02-05 SURGICAL SUPPLY — 35 items
ADH SKN CLS APL DERMABOND .7 (GAUZE/BANDAGES/DRESSINGS) ×1
APL PRP STRL LF DISP 70% ISPRP (MISCELLANEOUS) ×1
BLADE SURG SZ11 CARB STEEL (BLADE) ×2 IMPLANT
CHLORAPREP W/TINT 26 (MISCELLANEOUS) ×2 IMPLANT
CLOTH BEACON ORANGE TIMEOUT ST (SAFETY) ×2 IMPLANT
COVER LIGHT HANDLE STERIS (MISCELLANEOUS) ×4 IMPLANT
COVER WAND RF STERILE (DRAPES) ×2 IMPLANT
DERMABOND ADVANCED (GAUZE/BANDAGES/DRESSINGS) ×1
DERMABOND ADVANCED .7 DNX12 (GAUZE/BANDAGES/DRESSINGS) ×1 IMPLANT
ELECT REM PT RETURN 9FT ADLT (ELECTROSURGICAL) ×2
ELECTRODE REM PT RTRN 9FT ADLT (ELECTROSURGICAL) ×1 IMPLANT
GLOVE BIOGEL PI IND STRL 7.0 (GLOVE) ×2 IMPLANT
GLOVE BIOGEL PI IND STRL 7.5 (GLOVE) ×1 IMPLANT
GLOVE BIOGEL PI INDICATOR 7.0 (GLOVE) ×2
GLOVE BIOGEL PI INDICATOR 7.5 (GLOVE) ×1
GLOVE SURG SS PI 7.5 STRL IVOR (GLOVE) ×2 IMPLANT
GOWN STRL REUS W/TWL LRG LVL3 (GOWN DISPOSABLE) ×4 IMPLANT
INST SET MINOR GENERAL (KITS) ×2 IMPLANT
KIT TURNOVER KIT A (KITS) ×2 IMPLANT
MANIFOLD NEPTUNE II (INSTRUMENTS) ×2 IMPLANT
MESH VENTRALEX ST 2.5 CRC MED (Mesh General) ×2 IMPLANT
NEEDLE HYPO 18GX1.5 BLUNT FILL (NEEDLE) ×2 IMPLANT
NEEDLE HYPO 22GX1.5 SAFETY (NEEDLE) ×2 IMPLANT
NS IRRIG 1000ML POUR BTL (IV SOLUTION) ×2 IMPLANT
PACK MINOR (CUSTOM PROCEDURE TRAY) ×2 IMPLANT
PAD ARMBOARD 7.5X6 YLW CONV (MISCELLANEOUS) ×2 IMPLANT
PENCIL SMOKE EVACUATOR (MISCELLANEOUS) ×2 IMPLANT
SET BASIN LINEN APH (SET/KITS/TRAYS/PACK) ×2 IMPLANT
SUT ETHIBOND NAB MO 7 #0 18IN (SUTURE) ×2 IMPLANT
SUT MNCRL AB 4-0 PS2 18 (SUTURE) ×2 IMPLANT
SUT VIC AB 2-0 CT2 27 (SUTURE) ×2 IMPLANT
SUT VIC AB 3-0 SH 27 (SUTURE) ×2
SUT VIC AB 3-0 SH 27X BRD (SUTURE) ×1 IMPLANT
SUT VICRYL AB 3 0 TIES (SUTURE) IMPLANT
SYR 20ML LL LF (SYRINGE) ×4 IMPLANT

## 2020-02-05 NOTE — Op Note (Signed)
Patient:  Sarah [name]  DOB:  1961-04-03  MRN:  [id number]   Preop Diagnosis: Umbilical hernia  Postop Diagnosis: Same  Procedure: Umbilical herniorrhaphy with mesh  Surgeon: Aviva Signs, MD  Anes: General endotracheal  Indications: Patient is a 59 year old black female who presents with a symptomatic umbilical hernia.  The risks and benefits of the procedure including bleeding, infection, mesh use, and the possibility of recurrence of the hernia were fully explained to the patient, who gave informed consent.  Procedure note: The patient was placed in the supine position.  After induction of general endotracheal anesthesia, the abdomen was prepped and draped using the usual sterile technique with ChloraPrep.  Surgical site confirmation was performed.  An infraumbilical incision was made down to the fascia.  The umbilicus was freed away from the underlying fascia.  Some omentum was adherent to the hernia sac and the hernia sac was excised without difficulty.  The omentum was then reduced into the abdominal cavity.  I palpated the abdominal wall and no other hernias were palpable.  The hernia defect was approximately 2 cm in greatest diameter.  A 6.3 cm Bard Ventralax ST patch was then inserted and secured to the fascia using 0 Ethibond interrupted sutures.  The overlying fascia was reapproximated transversely using 0 Ethibond interrupted sutures.  The base the umbilicus was secured back to the fascia using a 2-0 Vicryl interrupted suture.  The subcutaneous layer was reapproximated using 3-0 Vicryl interrupted sutures.  Exparel was instilled into the surrounding wound.  The skin was closed using a 4-0 Monocryl subcuticular suture.  Dermabond was applied.  All tape and needle counts were correct at the end of the procedure.  Patient was extubated in the operating room and transferred to PACU in stable condition.  Complications: None  EBL: Minimal  Specimen: None

## 2020-02-05 NOTE — Anesthesia Preprocedure Evaluation (Signed)
Anesthesia Evaluation  Patient identified by MRN, date of birth, ID band Patient awake    Reviewed: Allergy & Precautions, H&P , NPO status , Patient's Chart, lab work & pertinent test results, reviewed documented beta blocker date and time   Airway Mallampati: III  TM Distance: >3 FB Neck ROM: full    Dental no notable dental hx. (+) Teeth Intact   Pulmonary neg pulmonary ROS, former smoker,    Pulmonary exam normal breath sounds clear to auscultation       Cardiovascular Exercise Tolerance: Good hypertension, + Valvular Problems/Murmurs MR  Rhythm:regular Rate:Normal     Neuro/Psych PSYCHIATRIC DISORDERS Anxiety negative neurological ROS     GI/Hepatic Neg liver ROS, GERD  Medicated,  Endo/Other  negative endocrine ROSdiabetes  Renal/GU negative Renal ROS  negative genitourinary   Musculoskeletal   Abdominal   Peds  Hematology negative hematology ROS (+)   Anesthesia Other Findings   Reproductive/Obstetrics negative OB ROS                             Anesthesia Physical Anesthesia Plan  ASA: II  Anesthesia Plan: General   Post-op Pain Management:    Induction:   PONV Risk Score and Plan: 2 and Ondansetron  Airway Management Planned:   Additional Equipment:   Intra-op Plan:   Post-operative Plan:   Informed Consent: I have reviewed the patients History and Physical, chart, labs and discussed the procedure including the risks, benefits and alternatives for the proposed anesthesia with the patient or authorized representative who has indicated his/her understanding and acceptance.     Dental Advisory Given  Plan Discussed with: CRNA  Anesthesia Plan Comments:         Anesthesia Quick Evaluation

## 2020-02-05 NOTE — Interval H&P Note (Signed)
History and Physical Interval Note:  02/05/2020 7:18 AM  Sarah [name]  has presented today for surgery, with the diagnosis of Umbilical hernia.  The various methods of treatment have been discussed with the patient and family. After consideration of risks, benefits and other options for treatment, the patient has consented to  Procedure(s): HERNIA REPAIR UMBILICAL ADULT (N/A) as a surgical intervention.  The patient's history has been reviewed, patient examined, no change in status, stable for surgery.  I have reviewed the patient's chart and labs.  Questions were answered to the patient's satisfaction.     Aviva Signs

## 2020-02-05 NOTE — Discharge Instructions (Signed)
Open Hernia Repair, Adult, Care After This sheet gives you information about how to care for yourself after your procedure. Your health care provider may also give you more specific instructions. If you have problems or questions, contact your health care provider. What can I expect after the procedure? After the procedure, it is common to have:  Mild discomfort.  Slight bruising.  Minor swelling.  Pain in the abdomen. Follow these instructions at home: Incision care   Follow instructions from your health care provider about how to take care of your incision area. Make sure you: ? Wash your hands with soap and water before you change your bandage (dressing). If soap and water are not available, use hand sanitizer. ? Change your dressing as told by your health care provider. ? Leave stitches (sutures), skin glue, or adhesive strips in place. These skin closures may need to stay in place for 2 weeks or longer. If adhesive strip edges start to loosen and curl up, you may trim the loose edges. Do not remove adhesive strips completely unless your health care provider tells you to do that.  Check your incision area every day for signs of infection. Check for: ? More redness, swelling, or pain. ? More fluid or blood. ? Warmth. ? Pus or a bad smell. Activity  Do not drive or use heavy machinery while taking prescription pain medicine. Do not drive until your health care provider approves.  Until your health care provider approves: ? Do not lift anything that is heavier than 10 lb (4.5 kg). ? Do not play contact sports.  Return to your normal activities as told by your health care provider. Ask your health care provider what activities are safe. General instructions  To prevent or treat constipation while you are taking prescription pain medicine, your health care provider may recommend that you: ? Drink enough fluid to keep your urine clear or pale yellow. ? Take over-the-counter or  prescription medicines. ? Eat foods that are high in fiber, such as fresh fruits and vegetables, whole grains, and beans. ? Limit foods that are high in fat and processed sugars, such as fried and sweet foods.  Take over-the-counter and prescription medicines only as told by your health care provider.  Do not take tub baths or go swimming until your health care provider approves.  Keep all follow-up visits as told by your health care provider. This is important. Contact a health care provider if:  You develop a rash.  You have more redness, swelling, or pain around your incision.  You have more fluid or blood coming from your incision.  Your incision feels warm to the touch.  You have pus or a bad smell coming from your incision.  You have a fever or chills.  You have blood in your stool (feces).  You have not had a bowel movement in 2-3 days.  Your pain is not controlled with medicine. Get help right away if:  You have chest pain or shortness of breath.  You feel light-headed or feel faint.  You have severe pain.  You vomit and your pain is worse. This information is not intended to replace advice given to you by your health care provider. Make sure you discuss any questions you have with your health care provider. Document Revised: 07/21/2017 Document Reviewed: 01/20/2016 Elsevier Patient Education  2020 [employer]  

## 2020-02-05 NOTE — Transfer of Care (Signed)
Immediate Anesthesia Transfer of Care Note  Patient: Sarah [name]  Procedure(s) Performed: UMBILICAL HERNIORRHAPY WITH MESH (N/A )  Patient Location: PACU  Anesthesia Type:General  Level of Consciousness: awake, alert , oriented and patient cooperative  Airway & Oxygen Therapy: Patient Spontanous Breathing  Post-op Assessment: Report given to RN, Post -op Vital signs reviewed and stable and Patient moving all extremities  Post vital signs: Reviewed and stable  Last Vitals:  Vitals Value Taken Time  BP    Temp    Pulse    Resp    SpO2      Last Pain:  Vitals:   02/05/20 0627  TempSrc: Oral  PainSc: 0-No pain         Complications: No complications documented.

## 2020-02-05 NOTE — Addendum Note (Signed)
Addendum  created 02/05/20 1348 by Tacy Learn, CRNA   Charge Capture section accepted

## 2020-02-05 NOTE — Anesthesia Procedure Notes (Signed)
Procedure Name: Intubation Performed by: Tacy Learn, CRNA Pre-anesthesia Checklist: Patient identified, Emergency Drugs available, Suction available, Patient being monitored and Timeout performed Patient Re-evaluated:Patient Re-evaluated prior to induction Oxygen Delivery Method: Circle system utilized Preoxygenation: Pre-oxygenation with 100% oxygen Induction Type: IV induction Laryngoscope Size: Miller and 2 Grade View: Grade I Tube type: Oral Tube size: 7.0 mm Number of attempts: 1 Airway Equipment and Method: Stylet Placement Confirmation: ETT inserted through vocal cords under direct vision,  positive ETCO2,  CO2 detector and breath sounds checked- equal and bilateral Secured at: 21 cm Tube secured with: Tape Dental Injury: Teeth and Oropharynx as per pre-operative assessment

## 2020-02-05 NOTE — Anesthesia Postprocedure Evaluation (Signed)
Anesthesia Post Note  Patient: Sarah [name]  Procedure(s) Performed: UMBILICAL HERNIORRHAPY WITH MESH (N/A )  Anesthesia Type: General Level of consciousness: awake, oriented, awake and alert and patient cooperative Pain management: pain level controlled Vital Signs Assessment: post-procedure vital signs reviewed and stable Respiratory status: spontaneous breathing, respiratory function stable and nonlabored ventilation Cardiovascular status: blood pressure returned to baseline and stable Postop Assessment: no headache and no backache Anesthetic complications: no   No complications documented.   Last Vitals:  Vitals:   02/05/20 0627  BP: (!) 143/74  Pulse: 60  Resp: 18  Temp: 36.6 C  SpO2: 97%    Last Pain:  Vitals:   02/05/20 0627  TempSrc: Oral  PainSc: 0-No pain                 Tacy Learn

## 2020-02-06 ENCOUNTER — Encounter (HOSPITAL_COMMUNITY): Payer: Self-pay | Admitting: General Surgery

## 2020-02-19 ENCOUNTER — Telehealth (INDEPENDENT_AMBULATORY_CARE_PROVIDER_SITE_OTHER): Payer: Self-pay | Admitting: General Surgery

## 2020-02-19 DIAGNOSIS — Z09 Encounter for follow-up examination after completed treatment for conditions other than malignant neoplasm: Secondary | ICD-10-CM

## 2020-02-21 NOTE — Progress Notes (Signed)
Subjective:     Sarah [name]  Telephone postoperative visit performed.  Patient states she is doing well.  Minimal incisional pain.  She is resuming her normal activity. Objective:    There were no vitals taken for this visit.  General:  alert, cooperative and no distress       Assessment:    Doing well postoperatively.    Plan:   Patient was instructed to call my office should any problems arise.  Follow-up as needed.  Telehealth visit performed, 3 minutes total.

## 2020-03-01 ENCOUNTER — Emergency Department (HOSPITAL_COMMUNITY)
Admission: EM | Admit: 2020-03-01 | Discharge: 2020-03-01 | Disposition: A | Discharge status: Home / Self Care | Payer: BC Managed Care – PPO | Attending: Emergency Medicine | Admitting: Emergency Medicine

## 2020-03-01 ENCOUNTER — Other Ambulatory Visit: Payer: Self-pay

## 2020-03-01 ENCOUNTER — Emergency Department (HOSPITAL_COMMUNITY): Payer: BC Managed Care – PPO

## 2020-03-01 ENCOUNTER — Encounter (HOSPITAL_COMMUNITY): Payer: Self-pay | Admitting: Emergency Medicine

## 2020-03-01 DIAGNOSIS — E119 Type 2 diabetes mellitus without complications: Secondary | ICD-10-CM | POA: Insufficient documentation

## 2020-03-01 DIAGNOSIS — T8130XA Disruption of wound, unspecified, initial encounter: Secondary | ICD-10-CM | POA: Diagnosis not present

## 2020-03-01 DIAGNOSIS — Z7982 Long term (current) use of aspirin: Secondary | ICD-10-CM | POA: Diagnosis not present

## 2020-03-01 DIAGNOSIS — T889XXA Complication of surgical and medical care, unspecified, initial encounter: Secondary | ICD-10-CM | POA: Diagnosis present

## 2020-03-01 DIAGNOSIS — Y629 Failure of sterile precautions during unspecified surgical and medical care: Secondary | ICD-10-CM | POA: Insufficient documentation

## 2020-03-01 DIAGNOSIS — Z7984 Long term (current) use of oral hypoglycemic drugs: Secondary | ICD-10-CM | POA: Diagnosis not present

## 2020-03-01 DIAGNOSIS — I10 Essential (primary) hypertension: Secondary | ICD-10-CM | POA: Diagnosis not present

## 2020-03-01 DIAGNOSIS — Z79899 Other long term (current) drug therapy: Secondary | ICD-10-CM | POA: Diagnosis not present

## 2020-03-01 DIAGNOSIS — Z87891 Personal history of nicotine dependence: Secondary | ICD-10-CM | POA: Insufficient documentation

## 2020-03-01 LAB — COMPREHENSIVE METABOLIC PANEL
ALT: 60 U/L — ABNORMAL HIGH (ref 0–44)
AST: 38 U/L (ref 15–41)
Albumin: 3.6 g/dL (ref 3.5–5.0)
Alkaline Phosphatase: 88 U/L (ref 38–126)
Anion gap: 11 (ref 5–15)
BUN: 18 mg/dL (ref 8–23)
CO2: 26 mmol/L (ref 22–32)
Calcium: 9.5 mg/dL (ref 8.9–10.3)
Chloride: 101 mmol/L (ref 98–111)
Creatinine, Ser: 0.7 mg/dL (ref 0.44–1.00)
GFR calc Af Amer: >60 mL/min (ref >60)
GFR calc non Af Amer: >60 mL/min (ref >60)
Glucose, Bld: 211 mg/dL — ABNORMAL HIGH (ref 70–99)
Potassium: 4.1 mmol/L (ref 3.5–5.1)
Sodium: 138 mmol/L (ref 135–145)
Total Bilirubin: 0.5 mg/dL (ref 0.3–1.2)
Total Protein: 8 g/dL (ref 6.5–8.1)

## 2020-03-01 LAB — CBC WITH DIFFERENTIAL/PLATELET
Abs Immature Granulocytes: 0.03 10*3/uL (ref 0.00–0.07)
Basophils Absolute: 0 10*3/uL (ref 0.0–0.1)
Basophils Relative: 0 %
Eosinophils Absolute: 0.2 10*3/uL (ref 0.0–0.5)
Eosinophils Relative: 2 %
HCT: 40.5 % (ref 36.0–46.0)
Hemoglobin: 14.2 g/dL (ref 12.0–15.0)
Immature Granulocytes: 0 %
Lymphocytes Relative: 42 %
Lymphs Abs: 4.2 10*3/uL — ABNORMAL HIGH (ref 0.7–4.0)
MCH: 28.4 pg (ref 26.0–34.0)
MCHC: 35.1 g/dL (ref 30.0–36.0)
MCV: 81 fL (ref 80.0–100.0)
Monocytes Absolute: 0.7 10*3/uL (ref 0.1–1.0)
Monocytes Relative: 7 %
Neutro Abs: 4.9 10*3/uL (ref 1.7–7.7)
Neutrophils Relative %: 49 %
Platelets: 305 10*3/uL (ref 150–400)
RBC: 5 MIL/uL (ref 3.87–5.11)
RDW: 14.1 % (ref 11.5–15.5)
WBC: 10 10*3/uL (ref 4.0–10.5)
nRBC: 0 % (ref 0.0–0.2)

## 2020-03-01 LAB — URINALYSIS, ROUTINE W REFLEX MICROSCOPIC
Bilirubin Urine: NEGATIVE
Glucose, UA: NEGATIVE mg/dL
Hgb urine dipstick: NEGATIVE
Ketones, ur: 5 mg/dL — AB
Leukocytes,Ua: NEGATIVE
Nitrite: NEGATIVE
Protein, ur: NEGATIVE mg/dL
Specific Gravity, Urine: 1.023 (ref 1.005–1.030)
pH: 6 (ref 5.0–8.0)

## 2020-03-01 LAB — LIPASE, BLOOD: Lipase: 35 U/L (ref 11–51)

## 2020-03-01 LAB — CBG MONITORING, ED: Glucose-Capillary: 222 mg/dL — ABNORMAL HIGH (ref 70–99)

## 2020-03-01 MED ORDER — IOHEXOL 300 MG/ML  SOLN
100.0000 mL | Freq: Once | INTRAMUSCULAR | Status: AC | PRN
  Administered 2020-03-01: 100 mL via INTRAVENOUS

## 2020-03-01 MED ORDER — DOXYCYCLINE HYCLATE 100 MG PO TABS
100.0000 mg | ORAL_TABLET | Freq: Once | ORAL | Status: AC
  Administered 2020-03-01: 100 mg via ORAL
  Filled 2020-03-01: qty 1

## 2020-03-01 MED ORDER — DOXYCYCLINE HYCLATE 100 MG PO CAPS
100.0000 mg | ORAL_CAPSULE | Freq: Two times a day (BID) | ORAL | 0 refills | Status: DC
Start: 2020-03-01 — End: 2020-06-16

## 2020-03-01 NOTE — Discharge Instructions (Signed)
Take the antibiotics as prescribed and follow-up with Dr. Arnoldo Morale on Monday.  Return to the ED with worsening pain, fever, vomiting, other concerns

## 2020-03-01 NOTE — ED Notes (Signed)
Patient transported to CT 

## 2020-03-01 NOTE — ED Triage Notes (Signed)
Pt had hernia repair surgery on June 16th and day before yesterday pt states she noticed some drainage at surgical site and then yesterday she swept floor and states that she has been having some shooting pains around site for past "couple of hours". States here to be evaluated "to make sure everything is alright". No drainage or redness noted at surgical site at present.

## 2020-03-01 NOTE — ED Provider Notes (Signed)
[facility] Provider Note   CSN: [id number] Arrival date & time: 03/01/20  0003     History Chief Complaint  Patient presents with  . Post-op Problem    Sarah [name] is a 59 y.o. female.  Patient underwent umbilical hernia repair surgery by Dr. Arnoldo Morale on June 16.  She states she was doing well until yesterday when she noticed some increasing pain in her incision site.  Today while she was sweeping the floor, states she bent over and there was some clear drainage with some blood and increased pain since.  Intermittent shooting pain to the site for the past couple of hours.  She denies any vomiting.  No fever.  No pain with urination or blood in the urine.  No blood thinner use.  She has not seen Dr. Arnoldo Morale since the surgery but believes that she was doing well.  She does have a history of diabetes.  No heart or lung problems  The history is provided by the patient.       Past Medical History:  Diagnosis Date  . Anxiety   . Diabetes mellitus   . Diverticulosis   . GERD (gastroesophageal reflux disease)   . Hypercholesteremia   . Hypertension     Patient Active Problem List   Diagnosis Date Noted  . Umbilical hernia without obstruction and without gangrene   . Special screening for malignant neoplasms, colon   . Diverticulosis of large intestine without diverticulitis   . History of palpitations in adulthood 07/02/2018  . Murmur, cardiac 07/02/2018  . History of chest pain 06/27/2018  . Essential hypertension 06/27/2018  . Trigger middle finger   . Diabetes mellitus type 2 in obese ([place]) 08/19/2015  . 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 Visit for routine gyn exam 08/19/2015  . Type 2 diabetes mellitus ([place]) 06/24/2013  . Rectal bleed 03/23/2012  . Hyperlipidemia 11/30/2009  . ANXIETY 11/30/2009  . CONSTIPATION 11/30/2009    Past Surgical History:  Procedure Laterality Date  . ABDOMINAL HYSTERECTOMY     complete  .  CARDIAC CATHETERIZATION  2011  . COLONOSCOPY  01/04/2010   left sided transverse diverticula/two diminutive rectal polyps  . COLONOSCOPY  03/28/2012   Procedure: COLONOSCOPY;  Surgeon: Daneil Dolin, MD;  Location: [facility];  Service: Endoscopy;  Laterality: N/A;  9:30  . COLONOSCOPY N/A 09/11/2018   Procedure: COLONOSCOPY;  Surgeon: Aviva Signs, MD;  Location: [facility];  Service: Gastroenterology;  Laterality: N/A;  . FOOT SURGERY     right-tendon repair  . laparoscopic sigmoid colectomy  02/01/10   Dr Jola Babinski  . TRIGGER FINGER RELEASE Left 03/2015  . TRIGGER FINGER RELEASE Right 02/25/2016   Procedure: RIGHT LONG TRIGGER FINGER RELEASE;  Surgeon: Carole Civil, MD;  Location: [facility];  Service: Orthopedics;  Laterality: Right;  . UMBILICAL HERNIA REPAIR N/A 02/05/2020   Procedure: UMBILICAL HERNIORRHAPY WITH MESH;  Surgeon: Aviva Signs, MD;  Location: [facility];  Service: General;  Laterality: N/A;     OB History    Gravida  1   Para  1   Term  1   Preterm      AB      Living  1     SAB      TAB      Ectopic      Multiple      Live Births  Family History  Problem Relation Age of Onset  . Colon cancer Maternal Grandfather   . Colon polyps Mother   . Diabetes Mother   . Hypertension Mother   . Heart attack Father   . Heart murmur Sister   . Hypertension Brother   . Diabetes Son     Social History   Tobacco Use  . Smoking status: Former Smoker    Packs/day: 1.00    Years: 10.00    Pack years: 10.00    Types: Cigarettes    Quit date: 09/05/2006    Years since quitting: 13.4  . Smokeless tobacco: Never Used  Vaping Use  . Vaping Use: Never used  Substance Use Topics  . Alcohol use: Yes    Comment: occasional  . Drug use: No    Home Medications Prior to Admission medications   Medication Sig Start Date End Date Taking? Authorizing Provider  aspirin EC 81 MG tablet Take 81 mg by mouth daily.    [provider]  escitalopram (LEXAPRO) 5 MG tablet Take 5 mg by mouth at bedtime.  07/22/19   [provider]  HYDROcodone-acetaminophen (NORCO) 5-325 MG tablet Take 1 tablet by mouth every 6 (six) hours as needed for moderate pain. 02/05/20   Aviva Signs, MD  ibuprofen (ADVIL) 800 MG tablet Take 800 mg by mouth every 6 (six) hours as needed for moderate pain.    [provider]  LORazepam (ATIVAN) 0.5 MG tablet Take 0.5 mg by mouth 2 (two) times daily as needed for anxiety. 11/28/19   [provider]  losartan (COZAAR) 25 MG tablet Take 25 mg by mouth daily.    [provider]  Magnesium 400 MG TABS Take 400 mg by mouth daily.    [provider]  metFORMIN (GLUCOPHAGE-XR) 500 MG 24 hr tablet Take 500 mg by mouth 2 (two) times daily.  09/06/16   [provider]  metoprolol tartrate (LOPRESSOR) 50 MG tablet Take 1 tablet (50 mg total) by mouth 2 (two) times daily. Patient taking differently: Take 50 mg by mouth 2 (two) times daily. Still taking metoprolol 07/26/19 01/24/20  Herminio Commons, MD  Multiple Vitamins-Minerals (MULTIVITAMIN PO) Take 1 tablet by mouth daily.    [provider]  Semaglutide (RYBELSUS) 14 MG TABS Take 14 mg by mouth daily.     [provider]  simvastatin (ZOCOR) 20 MG tablet Take 20 mg by mouth every Monday, Wednesday, and Friday.  10/19/19   [provider]  Tetrahydrozoline HCl (VISINE OP) Place 1 drop into both eyes daily as needed (dry eyes).    [provider]  TRESIBA FLEXTOUCH 200 UNIT/ML SOPN Inject 35 Units into the skin in the morning and at bedtime.  07/23/19   [provider]    Allergies    Sulfonamide derivatives and Latex  Review of Systems   Review of Systems  Constitutional: Negative for activity change, appetite change, fatigue and fever.  HENT: Negative for congestion and rhinorrhea.   Eyes: Negative for visual disturbance.  Respiratory: Negative for  cough, chest tightness and shortness of breath.   Cardiovascular: Negative for chest pain.  Gastrointestinal: Positive for abdominal pain and constipation. Negative for nausea and vomiting.  Genitourinary: Negative for dysuria and hematuria.  Musculoskeletal: Negative for arthralgias and myalgias.  Skin: Positive for wound.  Neurological: Negative for dizziness, weakness and headaches.   all other systems are negative except as noted in the HPI and PMH.  Physical Exam Updated Vital Signs BP 131/82 (BP Location: Right Arm)   Pulse 84   Temp 98 F (36.7 C) (Oral)   Resp 18   Ht 5' 3.5" (1.613 m)   Wt 83 kg   SpO2 98%   BMI 31.91 kg/m   Physical Exam Vitals and nursing note reviewed.  Constitutional:      General: She is not in acute distress.    Appearance: She is well-developed.  HENT:     Head: Normocephalic and atraumatic.     Mouth/Throat:     Pharynx: No oropharyngeal exudate.  Eyes:     Conjunctiva/sclera: Conjunctivae normal.     Pupils: Pupils are equal, round, and reactive to light.  Neck:     Comments: No meningismus. Cardiovascular:     Rate and Rhythm: Normal rate and regular rhythm.     Heart sounds: Normal heart sounds. No murmur heard.   Pulmonary:     Effort: Pulmonary effort is normal. No respiratory distress.     Breath sounds: Normal breath sounds.  Abdominal:     Palpations: Abdomen is soft.     Tenderness: There is no abdominal tenderness. There is no guarding or rebound.     Comments: Umbilical incision appears to be healing.  There is some crusting.  There is no erythema or significant drainage.  No fluctuance. Mild clear drainage  Musculoskeletal:        General: No tenderness. Normal range of motion.     Cervical back: Normal range of motion and neck supple.  Skin:    General: Skin is warm.  Neurological:     Mental Status: She is alert and oriented to person, place, and time.     Cranial Nerves: No cranial nerve deficit.     Motor: No  abnormal muscle tone.     Coordination: Coordination normal.     Comments:  5/5 strength throughout. CN 2-12 intact.Equal grip strength.   Psychiatric:        Behavior: Behavior normal.       ED Results / Procedures / Treatments   Labs (all labs ordered are listed, but only abnormal results are displayed) Labs Reviewed  CBC WITH DIFFERENTIAL/PLATELET - Abnormal; Notable for the following components:      Result Value   Lymphs Abs 4.2 (*)    All other components within normal limits  COMPREHENSIVE METABOLIC PANEL - Abnormal; Notable for the following components:   Glucose, Bld 211 (*)    ALT 60 (*)    All other components within normal limits  URINALYSIS, ROUTINE W REFLEX MICROSCOPIC - Abnormal; Notable for the following components:   Ketones, ur 5 (*)    All other components within normal limits  CBG MONITORING, ED - Abnormal; Notable for the following components:   Glucose-Capillary 222 (*)    All other components within normal limits  LIPASE, BLOOD    EKG None  Radiology CT ABDOMEN PELVIS W CONTRAST  Result Date: 03/01/2020 CLINICAL DATA:  Wound dehiscence. EXAM: CT ABDOMEN AND PELVIS WITH CONTRAST TECHNIQUE: Multidetector CT imaging of the abdomen and pelvis was performed using the standard protocol following bolus administration of intravenous contrast. CONTRAST:  17mL OMNIPAQUE IOHEXOL 300 MG/ML  SOLN COMPARISON:  May 08, 2018 FINDINGS: Lower chest: No acute abnormality. Hepatobiliary: No focal liver abnormality is seen. No gallstones, gallbladder wall thickening, or biliary dilatation. Pancreas: Unremarkable. No pancreatic ductal dilatation or surrounding inflammatory changes. Spleen: Normal in size without focal abnormality. Adrenals/Urinary Tract: Adrenal  glands are unremarkable. Kidneys are normal, without renal calculi, focal lesion, or hydronephrosis. Bladder is unremarkable. Stomach/Bowel: Stomach is within normal limits. The appendix is not identified.  Surgically anastomosed bowel is seen within the expected region of the proximal sigmoid colon. No evidence of bowel wall thickening, distention, or inflammatory changes. Noninflamed diverticula are seen throughout the large bowel Vascular/Lymphatic: There is moderate to marked severity calcification of the abdominal aorta. No enlarged abdominal or pelvic lymph nodes. Reproductive: Status post hysterectomy. No adnexal masses. Other: Mild subcutaneous inflammatory fat stranding is noted along the region of the umbilicus. There is no evidence of associated fluid collection or abscess. Musculoskeletal: No acute or significant osseous findings. IMPRESSION: 1. Mild subcutaneous inflammatory fat stranding along the region of the umbilicus without evidence of associated fluid collection or abscess. 2. Noninflamed diverticula throughout the large bowel. 3. Moderate to marked severity calcification of the abdominal aorta. Aortic Atherosclerosis (ICD10-I70.0). Electronically Signed   By: Virgina Norfolk M.D.   On: 03/01/2020 02:51    Procedures Procedures (including critical care time)  Medications Ordered in ED Medications - No data to display  ED Course  I have reviewed the triage vital signs and the nursing notes.  Pertinent labs & imaging results that were available during my care of the patient were reviewed by me and considered in my medical decision making (see chart for details).    MDM Rules/Calculators/A&P                         Pain at incision site from recent surgery on June 16.  There are some crusting and clear drainage but no evidence of significant infection.  Hyperglycemia without evidence of DKA.  CT scan is obtained and shows no abscess or fluid collection.  There is mild inflammatory fat stranding.  We will start antibiotics.  Advised patient to follow-up with Dr. Arnoldo Morale on Monday.  She appears well nontoxic and nonseptic.  Advised to keep wound clean and dry.  Return to the ED  with worsening pain, fever, vomiting or any other concerns. Final Clinical Impression(s) / ED Diagnoses Final diagnoses:  Wound dehiscence    Rx / DC Orders ED Discharge Orders    None       Shakeena Kafer, Annie Main, MD 03/01/20 [phone]

## 2020-03-01 NOTE — ED Notes (Signed)
ED Provider at bedside. 

## 2020-03-24 ENCOUNTER — Telehealth: Payer: Self-pay | Admitting: Family Medicine

## 2020-03-24 NOTE — Telephone Encounter (Signed)
Disability forms filled out, signed and faxed to [employer] at [phone] with successful confirmation on 03/17/2020.

## 2020-06-10 ENCOUNTER — Telehealth: Payer: BC Managed Care – PPO | Admitting: Cardiovascular Disease

## 2020-06-15 NOTE — Progress Notes (Signed)
Cardiology Office Note  Date: 06/16/2020   ID: [name], DOB 01/09/1961, MRN [id number]  PCP:  Sarah Squibb, MD  Cardiologist:  No primary care provider on file. Electrophysiologist:  None   Chief Complaint: Follow-up for wide-complex tachycardia  History of Present Illness: Sarah [name] is a 59 y.o. female with a history of wide-complex tachycardia, HTN, HLD, DM 2, GERD, mitral valve insufficiency.  Last encounter with Dr. Bronson [name] 12/05/2019 via telemedicine.  Previous monitor demonstrated 21 beat run of wide-complex tachycardia likely representing SVT with aberrancy.  Symptoms improved on Lopressor 50 mg p.o. twice daily.  Her magnesium was being supplemented.  Blood pressure was normal.  Mitral valve regurgitation was mild in severity by last echo 2019.  At last visit she was scheduled to undergo umbilical herniorrhaphy with mesh. Pt had hernia repair surgery on June 16th, 2021.  She denied any chest pain or palpitations.  She is here for 67-monthfollow-up.  States she is having a few more episodes of palpitations maybe once or twice a week.  She denies any associated lightheadedness, dizziness, presyncopal or syncopal episodes.  States she may notice an occasional associated sensation of chest tightness but no anginal or exertional symptoms otherwise.  States she is having some issues with glucose control.  Also having some increased stress due to taking care of her husband and is working more than usual to supplement income.  Denies any CVA or TIA-like symptoms, PND, orthopnea, bleeding issues, claudication-like symptoms, DVT or PE-like symptoms, or lower extremity edema.  She states this increased stress and problems with blood pressure control may be contributing to her palpitations.  Past Medical History:  Diagnosis Date  . Anxiety   . Diabetes mellitus   . Diverticulosis   . GERD (gastroesophageal reflux disease)   . Hypercholesteremia   . Hypertension     Past  Surgical History:  Procedure Laterality Date  . ABDOMINAL HYSTERECTOMY     complete  . CARDIAC CATHETERIZATION  2011  . COLONOSCOPY  01/04/2010   left sided transverse diverticula/two diminutive rectal polyps  . COLONOSCOPY  03/28/2012   Procedure: COLONOSCOPY;  Surgeon: RDaneil Dolin MD;  [facility];  Service: Endoscopy;  Laterality: N/A;  9:30  . COLONOSCOPY N/A 09/11/2018   Procedure: COLONOSCOPY;  Surgeon: JAviva Signs MD;  [facility]: [facility];  Service: Gastroenterology;  Laterality: N/A;  . FOOT SURGERY     right-tendon repair  . laparoscopic sigmoid colectomy  02/01/10   Dr JJola Babinski . TRIGGER FINGER RELEASE Left 03/2015  . TRIGGER FINGER RELEASE Right 02/25/2016   Procedure: RIGHT LONG TRIGGER FINGER RELEASE;  Surgeon: SCarole Civil MD;  Location: [facility];  Service: Orthopedics;  Laterality: Right;  . UMBILICAL HERNIA REPAIR N/A 02/05/2020   Procedure: UMBILICAL HERNIORRHAPY WITH MESH;  Surgeon: JAviva Signs MD;  Location: [facility];  Service: General;  Laterality: N/A;    Current Outpatient Medications  Medication Sig Dispense Refill  . aspirin EC 81 MG tablet Take 81 mg by mouth daily.    . Continuous Blood Gluc Receiver (DMilan DEVI by Does not apply route.    .Marland Kitchenescitalopram (LEXAPRO) 5 MG tablet Take 5 mg by mouth at bedtime.     .Marland KitchenHYDROcodone-acetaminophen (NORCO) 5-325 MG tablet Take 1 tablet by mouth every 6 (six) hours as needed for moderate pain. 25 tablet 0  . ibuprofen (ADVIL) 800 MG tablet Take 800 mg by mouth every 6 (six) hours  as needed for moderate pain.    . Insulin Disposable Pump (OMNIPOD DASH SYSTEM) KIT by Does not apply route.    Marland Kitchen LORazepam (ATIVAN) 0.5 MG tablet Take 0.5 mg by mouth 2 (two) times daily as needed for anxiety.    Marland Kitchen losartan (COZAAR) 25 MG tablet Take 25 mg by mouth daily.    . Magnesium 400 MG TABS Take 400 mg by mouth daily.    . metFORMIN (GLUCOPHAGE-XR) 500 MG 24 hr tablet Take 500 mg by  mouth 2 (two) times daily.     . metoprolol tartrate (LOPRESSOR) 50 MG tablet Take 1 tablet (50 mg total) by mouth 2 (two) times daily. (Patient taking differently: Take 50 mg by mouth 2 (two) times daily. Still taking metoprolol) 180 tablet 3  . Multiple Vitamins-Minerals (MULTIVITAMIN PO) Take 1 tablet by mouth daily.    . simvastatin (ZOCOR) 20 MG tablet Take 20 mg by mouth every Monday, Wednesday, and Friday.     . Tetrahydrozoline HCl (VISINE OP) Place 1 drop into both eyes daily as needed (dry eyes).     No current facility-administered medications for this visit.   Allergies:  Sulfonamide derivatives and Latex   Social History: The patient  reports that she quit smoking about 13 years ago. Her smoking use included cigarettes. She has a 10.00 pack-year smoking history. She has never used smokeless tobacco. She reports current alcohol use. She reports that she does not use drugs.   Family History: The patient's family history includes Colon cancer in her maternal grandfather; Colon polyps in her mother; Diabetes in her mother and son; Heart attack in her father; Heart murmur in her sister; Hypertension in her brother and mother.   ROS:  Please see the history of present illness. Otherwise, complete review of systems is positive for none.  All other systems are reviewed and negative.   Physical Exam: VS:  BP (!) 142/86   Pulse 68   Ht '5\' 3"'  (1.6 m)   Wt 185 lb 9.6 oz (84.2 kg)   SpO2 96%   BMI 32.88 kg/m , BMI Body mass index is 32.88 kg/m.  Wt Readings from Last 3 Encounters:  06/16/20 185 lb 9.6 oz (84.2 kg)  03/01/20 183 lb (83 kg)  02/05/20 183 lb (83 kg)    General: Patient appears comfortable at rest. Neck: Supple, no elevated JVP or carotid bruits, no thyromegaly. Lungs: Clear to auscultation, nonlabored breathing at rest. Cardiac: Regular rate and rhythm, no S3 or significant systolic murmur, no pericardial rub. Extremities: No pitting edema, distal pulses 2+. Skin:  Warm and dry. Musculoskeletal: No kyphosis. Neuropsychiatric: Alert and oriented x3, affect grossly appropriate.  ECG:  EKG on February 03, 2020 normal sinus rhythm.  T wave abnormality consider inferior lateral ischemia.  Recent Labwork: 03/01/2020: ALT 60; AST 38; BUN 18; Creatinine, Ser 0.70; Hemoglobin 14.2; Platelets 305; Potassium 4.1; Sodium 138     Component Value Date/Time   CHOL  12/11/2010 0423    172        ATP III CLASSIFICATION:  <200     mg/dL   Desirable  200-239  mg/dL   Borderline High  >=240    mg/dL   High          TRIG 137 12/11/2010 0423   HDL 19 (L) 12/11/2010 0423   CHOLHDL 9.1 12/11/2010 0423   VLDL 27 12/11/2010 0423   LDLCALC (H) 12/11/2010 0423    126  Total Cholesterol/HDL:CHD Risk Coronary Heart Disease Risk Table                     Men   Women  1/2 Average Risk   3.4   3.3  Average Risk       5.0   4.4  2 X Average Risk   9.6   7.1  3 X Average Risk  23.4   11.0        Use the calculated Patient Ratio above and the CHD Risk Table to determine the patient's CHD Risk.        ATP III CLASSIFICATION (LDL):  <100     mg/dL   Optimal  100-129  mg/dL   Near or Above                    Optimal  130-159  mg/dL   Borderline  160-189  mg/dL   High  >190     mg/dL   Very High    Other Studies Reviewed Today:   Cardiac Catheterization: 06/2010    NST: 04/2018  No diagnostic ST segment changes to indicate ischemia. Hypertensive blood pressure response. No chest pain was reported. There were no arrhythmias. Low risk Duke treadmill score of 5.5.  Blood pressure demonstrated a hypertensive response to exercise.  Small, mild intensity, apical anterior defect that is partially reversible, most likely reflective of variable breast attenuation. Increased TID ratio 1.27 looks to be due to misregistration of images.  This is a low risk study.  Nuclear stress EF: 74%.  Echocardiogram: 07/2018 Study Conclusions  - Left ventricle: The  cavity size was normal. Wall thickness was increased in a pattern of mild LVH. Systolic function was normal. The estimated ejection fraction was in the range of 60% to 65%. Wall motion was normal; there were no regional wall motion abnormalities. Features are consistent with a pseudonormal left ventricular filling pattern, with concomitant abnormal relaxation and increased filling pressure (grade 2 diastolic dysfunction). Doppler parameters are consistent with high ventricular filling pressure. - Mitral valve: There was mild regurgitation.  Event Monitor: 06/2018  Predominantly sinus rhythm with sinus tachycardia also seen.  21 beat run of ventricular tachycardia, heart rate 189 bpm.  Symptoms correlated with all of the above   Assessment and Plan:  1. Wide-complex tachycardia ([place])   2. Heart palpitations   3. Essential hypertension   4. Mitral valve insufficiency, unspecified etiology    1. Wide-complex tachycardia (HCC) Previous monitor in 2019 with 21 beat run of ventricular tachycardia.    2. Heart palpitations States she has palpitations approximately twice a week sometimes mad lasting a few minutes to sometimes 30 minutes.  Denies any associated lightheadedness dizziness.  States she notes occasional chest tightness.  Continue metoprolol 50 mg p.o. twice daily.  May take an 25 mg as needed for breakthrough palpitations.  3. Essential hypertension Blood pressure elevated today at 142/86.  Continue losartan 25 mg daily as well as metoprolol 50 mg p.o. twice daily.  Start monitoring blood pressures several times a week.  Goal is 130/80 or less.  Call us if sustained blood pressures about this number.  We may need to adjust antihypertensives in the future.  4. Mitral valve insufficiency, unspecified etiology Echo 2019 EF 60 to 65%, mild LVH, grade 2 DD, mild MR  Medication Adjustments/Labs and Tests Ordered: Current medicines are reviewed at length with the  patient today.  Concerns regarding medicines are outlined above.  Disposition: Follow-up with Dr. Domenic Polite or APP 6 months  Signed, Sarah July, NP 06/16/2020 10:05 AM    [facility] [place], [place], [place] [address] Phone: [phone]; Fax: [phone]

## 2020-06-16 ENCOUNTER — Emergency Department (HOSPITAL_COMMUNITY): Payer: BC Managed Care – PPO

## 2020-06-16 ENCOUNTER — Encounter: Payer: Self-pay | Admitting: Family Medicine

## 2020-06-16 ENCOUNTER — Ambulatory Visit (INDEPENDENT_AMBULATORY_CARE_PROVIDER_SITE_OTHER): Payer: BC Managed Care – PPO | Admitting: Family Medicine

## 2020-06-16 ENCOUNTER — Encounter (HOSPITAL_COMMUNITY): Payer: Self-pay | Admitting: Emergency Medicine

## 2020-06-16 ENCOUNTER — Emergency Department (HOSPITAL_COMMUNITY)
Admission: EM | Admit: 2020-06-16 | Discharge: 2020-06-16 | Disposition: A | Discharge status: Home / Self Care | Payer: BC Managed Care – PPO | Attending: Emergency Medicine | Admitting: Emergency Medicine

## 2020-06-16 ENCOUNTER — Other Ambulatory Visit: Payer: Self-pay

## 2020-06-16 VITALS — BP 142/86 | HR 68 | Ht 63.0 in | Wt 185.6 lb

## 2020-06-16 DIAGNOSIS — M545 Low back pain, unspecified: Secondary | ICD-10-CM | POA: Diagnosis not present

## 2020-06-16 DIAGNOSIS — R350 Frequency of micturition: Secondary | ICD-10-CM | POA: Insufficient documentation

## 2020-06-16 DIAGNOSIS — I10 Essential (primary) hypertension: Secondary | ICD-10-CM | POA: Insufficient documentation

## 2020-06-16 DIAGNOSIS — I340 Nonrheumatic mitral (valve) insufficiency: Secondary | ICD-10-CM | POA: Diagnosis not present

## 2020-06-16 DIAGNOSIS — E119 Type 2 diabetes mellitus without complications: Secondary | ICD-10-CM | POA: Insufficient documentation

## 2020-06-16 DIAGNOSIS — Z7982 Long term (current) use of aspirin: Secondary | ICD-10-CM | POA: Insufficient documentation

## 2020-06-16 DIAGNOSIS — Z794 Long term (current) use of insulin: Secondary | ICD-10-CM | POA: Insufficient documentation

## 2020-06-16 DIAGNOSIS — I472 Ventricular tachycardia: Secondary | ICD-10-CM | POA: Diagnosis not present

## 2020-06-16 DIAGNOSIS — Z7984 Long term (current) use of oral hypoglycemic drugs: Secondary | ICD-10-CM | POA: Diagnosis not present

## 2020-06-16 DIAGNOSIS — M549 Dorsalgia, unspecified: Secondary | ICD-10-CM | POA: Diagnosis present

## 2020-06-16 DIAGNOSIS — R109 Unspecified abdominal pain: Secondary | ICD-10-CM | POA: Insufficient documentation

## 2020-06-16 DIAGNOSIS — R002 Palpitations: Secondary | ICD-10-CM | POA: Diagnosis not present

## 2020-06-16 DIAGNOSIS — Z91040 Latex allergy status: Secondary | ICD-10-CM | POA: Insufficient documentation

## 2020-06-16 DIAGNOSIS — Z79899 Other long term (current) drug therapy: Secondary | ICD-10-CM | POA: Insufficient documentation

## 2020-06-16 DIAGNOSIS — Z87891 Personal history of nicotine dependence: Secondary | ICD-10-CM | POA: Insufficient documentation

## 2020-06-16 DIAGNOSIS — R000 Tachycardia, unspecified: Secondary | ICD-10-CM

## 2020-06-16 LAB — CBC WITH DIFFERENTIAL/PLATELET
Abs Immature Granulocytes: 0.05 10*3/uL (ref 0.00–0.07)
Basophils Absolute: 0 10*3/uL (ref 0.0–0.1)
Basophils Relative: 0 %
Eosinophils Absolute: 0.2 10*3/uL (ref 0.0–0.5)
Eosinophils Relative: 2 %
HCT: 39.2 % (ref 36.0–46.0)
Hemoglobin: 13.6 g/dL (ref 12.0–15.0)
Immature Granulocytes: 1 %
Lymphocytes Relative: 32 %
Lymphs Abs: 3 10*3/uL (ref 0.7–4.0)
MCH: 28.3 pg (ref 26.0–34.0)
MCHC: 34.7 g/dL (ref 30.0–36.0)
MCV: 81.5 fL (ref 80.0–100.0)
Monocytes Absolute: 0.7 10*3/uL (ref 0.1–1.0)
Monocytes Relative: 7 %
Neutro Abs: 5.5 10*3/uL (ref 1.7–7.7)
Neutrophils Relative %: 58 %
Platelets: 310 10*3/uL (ref 150–400)
RBC: 4.81 MIL/uL (ref 3.87–5.11)
RDW: 14.6 % (ref 11.5–15.5)
WBC: 9.5 10*3/uL (ref 4.0–10.5)
nRBC: 0 % (ref 0.0–0.2)

## 2020-06-16 LAB — BASIC METABOLIC PANEL
Anion gap: 10 (ref 5–15)
BUN: 23 mg/dL (ref 8–23)
CO2: 25 mmol/L (ref 22–32)
Calcium: 9.3 mg/dL (ref 8.9–10.3)
Chloride: 102 mmol/L (ref 98–111)
Creatinine, Ser: 0.68 mg/dL (ref 0.44–1.00)
GFR, Estimated: >60 mL/min (ref >60)
Glucose, Bld: 256 mg/dL — ABNORMAL HIGH (ref 70–99)
Potassium: 3.9 mmol/L (ref 3.5–5.1)
Sodium: 137 mmol/L (ref 135–145)

## 2020-06-16 LAB — URINALYSIS, ROUTINE W REFLEX MICROSCOPIC
Bacteria, UA: NONE SEEN
Bilirubin Urine: NEGATIVE
Glucose, UA: >=500 mg/dL — AB
Hgb urine dipstick: NEGATIVE
Ketones, ur: 5 mg/dL — AB
Leukocytes,Ua: NEGATIVE
Nitrite: NEGATIVE
Protein, ur: NEGATIVE mg/dL
Specific Gravity, Urine: 1.024 (ref 1.005–1.030)
pH: 6 (ref 5.0–8.0)

## 2020-06-16 MED ORDER — METHOCARBAMOL 500 MG PO TABS
500.0000 mg | ORAL_TABLET | Freq: Two times a day (BID) | ORAL | 0 refills | Status: DC
Start: 2020-06-16 — End: 2021-12-10

## 2020-06-16 MED ORDER — METHOCARBAMOL 500 MG PO TABS
500.0000 mg | ORAL_TABLET | Freq: Once | ORAL | Status: AC
  Administered 2020-06-16: 500 mg via ORAL
  Filled 2020-06-16: qty 1

## 2020-06-16 MED ORDER — NAPROXEN 500 MG PO TABS
500.0000 mg | ORAL_TABLET | Freq: Two times a day (BID) | ORAL | 0 refills | Status: DC
Start: 2020-06-16 — End: 2021-01-27

## 2020-06-16 MED ORDER — HYDROCODONE-ACETAMINOPHEN 5-325 MG PO TABS
1.0000 | ORAL_TABLET | Freq: Once | ORAL | Status: AC
  Administered 2020-06-16: 1 via ORAL
  Filled 2020-06-16: qty 1

## 2020-06-16 NOTE — Patient Instructions (Signed)
Medication Instructions:  Continue all current medications.   Labwork: none  Testing/Procedures: none  Follow-Up: 6 months   Any Other Special Instructions Will Be Listed Below (If Applicable).   If you need a refill on your cardiac medications before your next appointment, please call your pharmacy.  

## 2020-06-16 NOTE — ED Notes (Signed)
Patient transported to CT 

## 2020-06-16 NOTE — ED Provider Notes (Signed)
[facility] EMERGENCY DEPARTMENT Provider Note   CSN: [id number] Arrival date & time: 06/16/20  1755     History Chief Complaint  Patient presents with  . Back Pain  . Dysuria    Sarah [name] is a 59 y.o. female with PMHx HTN, hypercholesteremia, Diabetes, GERD who presents to the ED today with complaint of gradual onset, constant, waxing and waning, right flank pain x 3-4 days. Pt also complains of urinary frequency. She has been taking Ibuprofen with mild relief however reports worsening pain today prompting her to come to the ED. Pt reports hx of kidney stones several years ago and is unsure if this pain is similar. She has also had UTIs in the past however states this does not feel the same - denies any dysuria despite triage note stating so. No fevers or chills. No other complaints today.   The history is provided by the patient and medical records.       Past Medical History:  Diagnosis Date  . Anxiety   . Diabetes mellitus   . Diverticulosis   . GERD (gastroesophageal reflux disease)   . Hypercholesteremia   . Hypertension     Patient Active Problem List   Diagnosis Date Noted  . Umbilical hernia without obstruction and without gangrene   . Special screening for malignant neoplasms, colon   . Diverticulosis of large intestine without diverticulitis   . History of palpitations in adulthood 07/02/2018  . Murmur, cardiac 07/02/2018  . History of chest pain 06/27/2018  . Essential hypertension 06/27/2018  . Trigger middle finger   . Diabetes mellitus type 2 in obese ([place]) 08/19/2015  . 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 Visit for routine gyn exam 08/19/2015  . Type 2 diabetes mellitus ([place]) 06/24/2013  . Rectal bleed 03/23/2012  . Hyperlipidemia 11/30/2009  . ANXIETY 11/30/2009  . CONSTIPATION 11/30/2009    Past Surgical History:  Procedure Laterality Date  . ABDOMINAL HYSTERECTOMY     complete  . CARDIAC CATHETERIZATION  2011  .  COLONOSCOPY  01/04/2010   left sided transverse diverticula/two diminutive rectal polyps  . COLONOSCOPY  03/28/2012   Procedure: COLONOSCOPY;  Surgeon: Daneil Dolin, MD;  [facility];  Service: Endoscopy;  Laterality: N/A;  9:30  . COLONOSCOPY N/A 09/11/2018   Procedure: COLONOSCOPY;  Surgeon: Aviva Signs, MD;  [facility];  Service: Gastroenterology;  Laterality: N/A;  . FOOT SURGERY     right-tendon repair  . laparoscopic sigmoid colectomy  02/01/10   Dr Jola Babinski  . TRIGGER FINGER RELEASE Left 03/2015  . TRIGGER FINGER RELEASE Right 02/25/2016   Procedure: RIGHT LONG TRIGGER FINGER RELEASE;  Surgeon: Carole Civil, MD;  Location: [facility];  Service: Orthopedics;  Laterality: Right;  . UMBILICAL HERNIA REPAIR N/A 02/05/2020   Procedure: UMBILICAL HERNIORRHAPY WITH MESH;  Surgeon: Aviva Signs, MD;  Location: [facility];  Service: General;  Laterality: N/A;     OB History    Gravida  1   Para  1   Term  1   Preterm      AB      Living  1     SAB      TAB      Ectopic      Multiple      Live Births              Family History  Problem Relation Age of Onset  . Colon cancer Maternal  Grandfather   . Colon polyps Mother   . Diabetes Mother   . Hypertension Mother   . Heart attack Father   . Heart murmur Sister   . Hypertension Brother   . Diabetes Son     Social History   Tobacco Use  . Smoking status: Former Smoker    Packs/day: 1.00    Years: 10.00    Pack years: 10.00    Types: Cigarettes    Quit date: 09/05/2006    Years since quitting: 13.7  . Smokeless tobacco: Never Used  Vaping Use  . Vaping Use: Never used  Substance Use Topics  . Alcohol use: Yes    Comment: occasional  . Drug use: No    Home Medications Prior to Admission medications   Medication Sig Start Date End Date Taking? Authorizing Provider  aspirin EC 81 MG tablet Take 81 mg by mouth daily.    [provider]  Continuous Blood  Gluc Receiver ([place]) [place] by Does not apply route.    [provider]  escitalopram (LEXAPRO) 5 MG tablet Take 5 mg by mouth at bedtime.  07/22/19   [provider]  HYDROcodone-acetaminophen (NORCO) 5-325 MG tablet Take 1 tablet by mouth every 6 (six) hours as needed for moderate pain. 02/05/20   Aviva Signs, MD  ibuprofen (ADVIL) 800 MG tablet Take 800 mg by mouth every 6 (six) hours as needed for moderate pain.    [provider]  Insulin Disposable Pump (OMNIPOD DASH SYSTEM) KIT by Does not apply route.    [provider]  LORazepam (ATIVAN) 0.5 MG tablet Take 0.5 mg by mouth 2 (two) times daily as needed for anxiety. 11/28/19   [provider]  losartan (COZAAR) 25 MG tablet Take 25 mg by mouth daily.    [provider]  Magnesium 400 MG TABS Take 400 mg by mouth daily.    [provider]  metFORMIN (GLUCOPHAGE-XR) 500 MG 24 hr tablet Take 500 mg by mouth 2 (two) times daily.  09/06/16   [provider]  methocarbamol (ROBAXIN) 500 MG tablet Take 1 tablet (500 mg total) by mouth 2 (two) times daily. 06/16/20   Alroy Bailiff, Tallen Schnorr, PA-C  metoprolol tartrate (LOPRESSOR) 50 MG tablet Take 1 tablet (50 mg total) by mouth 2 (two) times daily. Patient taking differently: Take 50 mg by mouth 2 (two) times daily. Still taking metoprolol 07/26/19 06/16/21  Herminio Commons, MD  Multiple Vitamins-Minerals (MULTIVITAMIN PO) Take 1 tablet by mouth daily.    [provider]  naproxen (NAPROSYN) 500 MG tablet Take 1 tablet (500 mg total) by mouth 2 (two) times daily. 06/16/20   Eustaquio Maize, PA-C  simvastatin (ZOCOR) 20 MG tablet Take 20 mg by mouth every Monday, Wednesday, and Friday.  10/19/19   [provider]  Tetrahydrozoline HCl (VISINE OP) Place 1 drop into both eyes daily as needed (dry eyes).    [provider]    Allergies    Sulfonamide derivatives and Latex  Review of Systems     Review of Systems  Constitutional: Negative for chills and fever.  Gastrointestinal: Negative for nausea and vomiting.  Genitourinary: Positive for flank pain and frequency. Negative for dysuria.  Musculoskeletal: Positive for back pain.  All other systems reviewed and are negative.   Physical Exam Updated Vital Signs BP (!) 152/71 (BP Location: Right Arm)   Pulse 87   Temp 98.5 F (36.9 C) (Oral)   Resp 20  Ht '5\' 3"'  (1.6 m)   Wt 83.9 kg   SpO2 99%   BMI 32.77 kg/m   Physical Exam Vitals and nursing note reviewed.  Constitutional:      Appearance: She is not ill-appearing.  HENT:     Head: Normocephalic and atraumatic.  Eyes:     Conjunctiva/sclera: Conjunctivae normal.  Cardiovascular:     Rate and Rhythm: Normal rate and regular rhythm.     Pulses: Normal pulses.  Pulmonary:     Effort: Pulmonary effort is normal.     Breath sounds: Normal breath sounds. No wheezing, rhonchi or rales.  Abdominal:     Palpations: Abdomen is soft.     Tenderness: There is no abdominal tenderness. There is right CVA tenderness. There is no guarding or rebound.  Musculoskeletal:     Cervical back: Neck supple.     Comments: No C, T, or L midline spinal TTP. Strength and sensation intact to all extremities. 2+ distal pulses to BUE and BLEs.   Skin:    General: Skin is warm and dry.  Neurological:     Mental Status: She is alert.     ED Results / Procedures / Treatments   Labs (all labs ordered are listed, but only abnormal results are displayed) Labs Reviewed  URINALYSIS, ROUTINE W REFLEX MICROSCOPIC - Abnormal; Notable for the following components:      Result Value   Glucose, UA >=500 (*)    Ketones, ur 5 (*)    All other components within normal limits  BASIC METABOLIC PANEL - Abnormal; Notable for the following components:   Glucose, Bld 256 (*)    All other components within normal limits  CBC WITH DIFFERENTIAL/PLATELET    EKG None  Radiology CT Renal Stone  Study  Result Date: 06/16/2020 CLINICAL DATA:  Flank pain.  Low back pain with dysuria. EXAM: CT ABDOMEN AND PELVIS WITHOUT CONTRAST TECHNIQUE: Multidetector CT imaging of the abdomen and pelvis was performed following the standard protocol without IV contrast. COMPARISON:  Abdominopelvic CT 03/01/2020 FINDINGS: Lower chest: The lung bases are clear. The heart is normal in size. No pleural fluid. Hepatobiliary: No evidence of focal hepatic lesion on noncontrast exam. Decompressed gallbladder. No calcified gallstone. No pericholecystic inflammation or biliary dilatation. Pancreas: No ductal dilatation or inflammation. Spleen: Normal in size without focal abnormality. Adrenals/Urinary Tract: No adrenal nodule. No hydronephrosis. No renal calculi. No perinephric edema. Both ureters are decompressed without stones along the course. Urinary bladder is partially distended. No bladder stone. No perivesicular fat stranding. Stomach/Bowel: Stomach distended with ingested contents. Normal positioning of the ligament of Treitz. No small bowel obstruction or abnormal distention. No small bowel inflammation. The appendix is not definitively visualized. Multifocal colonic diverticulosis. No diverticulitis or acute inflammation. Enteric sutures noted in the sigmoid colon. Vascular/Lymphatic: Moderate to advanced aorto bi-iliac atherosclerosis. Moderate aortic branch atherosclerosis. No aneurysm. No enlarged lymph nodes in the abdomen or pelvis. Reproductive: Status post hysterectomy. No adnexal masses. Other: Mild scarring at the umbilicus likely related to prior hernia repair. No evidence of recurrent hernia. No free air or ascites. Musculoskeletal: Mild degenerative change of the hips and in the lumbar spine. There are no acute or suspicious osseous abnormalities. IMPRESSION: 1. No renal stones or obstructive uropathy. No acute abnormality in the abdomen/pelvis. 2. Colonic diverticulosis without diverticulitis. Aortic  Atherosclerosis (ICD10-I70.0). Electronically Signed   By: Keith Rake M.D.   On: 06/16/2020 20:04    Procedures Procedures (including critical care time)  Medications  Ordered in ED Medications  methocarbamol (ROBAXIN) tablet 500 mg (has no administration in time range)  HYDROcodone-acetaminophen (NORCO/VICODIN) 5-325 MG per tablet 1 tablet (1 tablet Oral Given 06/16/20 2026)    ED Course  I have reviewed the triage vital signs and the nursing notes.  Pertinent labs & imaging results that were available during my care of the patient were reviewed by me and considered in my medical decision making (see chart for details).    MDM Rules/Calculators/A&P                          59 year old female presenting to the ED today with complaint of flank pain and urinary frequency for the past few days with mild relief with ibuprofen. Hx of stones however unsure if this feels similar. On arrival to the ED pt is afebrile, nontachycardic, and nontachypneic. Appears to be in NAD. She has right sided CVA TTP on exam. No abd TTP. No midline spinal TTP. No recent trauma to back. Will plan to work up for stones at this time with U/A, CBC, BMP, and CT renal stone study. Pain meds provided.   U/A has returned normal without signs of infection or hgb. Pt already taken to CT scan CT scan negative Labwork unremarkable.   Pt mentions that she did push a large cabinet earlier in the week; will treat as MSK pain at this time with antiinflammatories and muscle relaxers with close PCP follow up. Pt is in agreement with plan and stable for discharge home.   This note was prepared using Dragon voice recognition software and may include unintentional dictation errors due to the inherent limitations of voice recognition software.  Final Clinical Impression(s) / ED Diagnoses Final diagnoses:  Acute right-sided low back pain without sciatica    Rx / DC Orders ED Discharge Orders         Ordered     methocarbamol (ROBAXIN) 500 MG tablet  2 times daily        06/16/20 2109    naproxen (NAPROSYN) 500 MG tablet  2 times daily        06/16/20 2109           Discharge Instructions     Please pick up medication and take as prescribed for your pain. The muscle relaxer can make you drowsy - DO NOT DRIVE WHILE ON THIS MEDICATION.   Follow up with your PCP regarding your ED visit today and for recheck of your symptoms.   Return to the ED for any worsening symptoms.        Eustaquio Maize, PA-C 06/16/20 2113    Maudie Flakes, MD 06/16/20 [phone]

## 2020-06-16 NOTE — ED Triage Notes (Signed)
Patient is having lower back pain with dysuria x 2 days.

## 2020-06-16 NOTE — Discharge Instructions (Signed)
Please pick up medication and take as prescribed for your pain. The muscle relaxer can make you drowsy - DO NOT DRIVE WHILE ON THIS MEDICATION.   Follow up with your PCP regarding your ED visit today and for recheck of your symptoms.   Return to the ED for any worsening symptoms.

## 2020-08-08 ENCOUNTER — Encounter (HOSPITAL_COMMUNITY): Payer: Self-pay | Admitting: Emergency Medicine

## 2020-08-08 ENCOUNTER — Emergency Department (HOSPITAL_COMMUNITY)
Admission: EM | Admit: 2020-08-08 | Discharge: 2020-08-08 | Disposition: A | Discharge status: Home / Self Care | Payer: BC Managed Care – PPO | Attending: Emergency Medicine | Admitting: Emergency Medicine

## 2020-08-08 ENCOUNTER — Other Ambulatory Visit: Payer: Self-pay

## 2020-08-08 ENCOUNTER — Emergency Department (HOSPITAL_COMMUNITY): Payer: BC Managed Care – PPO

## 2020-08-08 DIAGNOSIS — Z9861 Coronary angioplasty status: Secondary | ICD-10-CM | POA: Diagnosis not present

## 2020-08-08 DIAGNOSIS — W010XXA Fall on same level from slipping, tripping and stumbling without subsequent striking against object, initial encounter: Secondary | ICD-10-CM | POA: Insufficient documentation

## 2020-08-08 DIAGNOSIS — Z7984 Long term (current) use of oral hypoglycemic drugs: Secondary | ICD-10-CM | POA: Insufficient documentation

## 2020-08-08 DIAGNOSIS — Z87891 Personal history of nicotine dependence: Secondary | ICD-10-CM | POA: Diagnosis not present

## 2020-08-08 DIAGNOSIS — Z79899 Other long term (current) drug therapy: Secondary | ICD-10-CM | POA: Diagnosis not present

## 2020-08-08 DIAGNOSIS — Z7982 Long term (current) use of aspirin: Secondary | ICD-10-CM | POA: Insufficient documentation

## 2020-08-08 DIAGNOSIS — S63641A Sprain of metacarpophalangeal joint of right thumb, initial encounter: Secondary | ICD-10-CM

## 2020-08-08 DIAGNOSIS — I10 Essential (primary) hypertension: Secondary | ICD-10-CM | POA: Insufficient documentation

## 2020-08-08 DIAGNOSIS — Z91040 Latex allergy status: Secondary | ICD-10-CM | POA: Diagnosis not present

## 2020-08-08 DIAGNOSIS — E119 Type 2 diabetes mellitus without complications: Secondary | ICD-10-CM | POA: Diagnosis not present

## 2020-08-08 DIAGNOSIS — S60931A Unspecified superficial injury of right thumb, initial encounter: Secondary | ICD-10-CM | POA: Diagnosis present

## 2020-08-08 MED ORDER — TRAMADOL HCL 50 MG PO TABS: 50.0000 mg | ORAL_TABLET | Freq: Four times a day (QID) | ORAL | 0 refills | Status: DC | PRN

## 2020-08-08 MED ORDER — TRAMADOL HCL 50 MG PO TABS
50.0000 mg | ORAL_TABLET | Freq: Once | ORAL | Status: AC
  Administered 2020-08-08: 50 mg via ORAL
  Filled 2020-08-08: qty 1

## 2020-08-08 NOTE — Discharge Instructions (Signed)
Your x-ray is negative for fracture or dislocation.  I suspect you have a sprained the soft tissues of your thumb which should heal with time.  Wear the splint provided for as long as it is providing improvement in your pain, this may take several weeks to heal completely.  Ice and elevation will also help relieve pain and swelling.  I recommend continuing to use your ibuprofen as this will actually help heal the injury.  You may take the tramadol prescribed for additional pain relief.  This pain medication is a mild narcotic and you will need to use caution and not drive within 4 hours of taking this medication.  You may follow-up with with orthopedics, either Dr. Aline Brochure or Dr. Amedeo Kinsman listed above if your pain symptoms are not improving over the next 2 weeks with today's treatment plan.

## 2020-08-08 NOTE — ED Provider Notes (Signed)
[facility] EMERGENCY DEPARTMENT Provider Note   CSN: [id number] Arrival date & time: 08/08/20  2113     History Chief Complaint  Patient presents with  . Hand Pain    Sarah [name] is a 59 y.o. female with a history of DM, GERD, HTN, is right handed, presenting with pain and swelling at the base of her right thumb after falling in a [place] type pattern yesterday when decorating her front porch.  She initially used ice and rest and has taken ibuprofen 800 mg without significant improvement in pain.  She woke today with swelling at the base of the thumb.  She denies radiation of pain and has no weakness or numbness in her finger tips.   HPI     Past Medical History:  Diagnosis Date  . Anxiety   . Diabetes mellitus   . Diverticulosis   . GERD (gastroesophageal reflux disease)   . Hypercholesteremia   . Hypertension     Patient Active Problem List   Diagnosis Date Noted  . Umbilical hernia without obstruction and without gangrene   . Special screening for malignant neoplasms, colon   . Diverticulosis of large intestine without diverticulitis   . History of palpitations in adulthood 07/02/2018  . Murmur, cardiac 07/02/2018  . History of chest pain 06/27/2018  . Essential hypertension 06/27/2018  . Trigger middle finger   . Diabetes mellitus type 2 in obese ([place]) 08/19/2015  . 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 Visit for routine gyn exam 08/19/2015  . Type 2 diabetes mellitus ([place]) 06/24/2013  . Rectal bleed 03/23/2012  . Hyperlipidemia 11/30/2009  . ANXIETY 11/30/2009  . CONSTIPATION 11/30/2009    Past Surgical History:  Procedure Laterality Date  . ABDOMINAL HYSTERECTOMY     complete  . CARDIAC CATHETERIZATION  2011  . COLONOSCOPY  01/04/2010   left sided transverse diverticula/two diminutive rectal polyps  . COLONOSCOPY  03/28/2012   Procedure: COLONOSCOPY;  Surgeon: Daneil Dolin, MD;  Location: [facility];  Service: Endoscopy;   Laterality: N/A;  9:30  . COLONOSCOPY N/A 09/11/2018   Procedure: COLONOSCOPY;  Surgeon: Aviva Signs, MD;  Location: [facility];  Service: Gastroenterology;  Laterality: N/A;  . FOOT SURGERY     right-tendon repair  . laparoscopic sigmoid colectomy  02/01/10   Dr Jola Babinski  . TRIGGER FINGER RELEASE Left 03/2015  . TRIGGER FINGER RELEASE Right 02/25/2016   Procedure: RIGHT LONG TRIGGER FINGER RELEASE;  Surgeon: Carole Civil, MD;  Location: [facility] OR[facility];  Service: Orthopedics;  Laterality: Right;  . UMBILICAL HERNIA REPAIR N/A 02/05/2020   Procedure: UMBILICAL HERNIORRHAPY WITH MESH;  Surgeon: Aviva Signs, MD;  Location: [facility] OR[facility];  Service: General;  Laterality: N/A;     OB History    Gravida  1   Para  1   Term  1   Preterm      AB      Living  1     SAB      IAB      Ectopic      Multiple      Live Births              Family History  Problem Relation Age of Onset  . Colon cancer Maternal Grandfather   . Colon polyps Mother   . Diabetes Mother   . Hypertension Mother   . Heart attack Father   . Heart murmur Sister   . Hypertension Brother   .  Diabetes Son     Social History   Tobacco Use  . Smoking status: Former Smoker    Packs/day: 1.00    Years: 10.00    Pack years: 10.00    Types: Cigarettes    Quit date: 09/05/2006    Years since quitting: 13.9  . Smokeless tobacco: Never Used  Vaping Use  . Vaping Use: Never used  Substance Use Topics  . Alcohol use: Yes    Comment: occasional  . Drug use: No    Home Medications Prior to Admission medications   Medication Sig Start Date End Date Taking? Authorizing Provider  aspirin EC 81 MG tablet Take 81 mg by mouth daily.    [provider]  Continuous Blood Gluc Receiver ([place]) [place] by Does not apply route.    [provider]  escitalopram (LEXAPRO) 5 MG tablet Take 5 mg by mouth at bedtime.  07/22/19   [provider]  ibuprofen (ADVIL) 800 MG  tablet Take 800 mg by mouth every 6 (six) hours as needed for moderate pain.    [provider]  Insulin Disposable Pump (OMNIPOD DASH SYSTEM) KIT by Does not apply route.    [provider]  LORazepam (ATIVAN) 0.5 MG tablet Take 0.5 mg by mouth 2 (two) times daily as needed for anxiety. 11/28/19   [provider]  losartan (COZAAR) 25 MG tablet Take 25 mg by mouth daily.    [provider]  Magnesium 400 MG TABS Take 400 mg by mouth daily.    [provider]  metFORMIN (GLUCOPHAGE-XR) 500 MG 24 hr tablet Take 500 mg by mouth 2 (two) times daily.  09/06/16   [provider]  methocarbamol (ROBAXIN) 500 MG tablet Take 1 tablet (500 mg total) by mouth 2 (two) times daily. 06/16/20   Alroy Bailiff, [place], PA-C  metoprolol tartrate (LOPRESSOR) 50 MG tablet Take 1 tablet (50 mg total) by mouth 2 (two) times daily. Patient taking differently: Take 50 mg by mouth 2 (two) times daily. Still taking metoprolol 07/26/19 06/16/21  Herminio Commons, MD  Multiple Vitamins-Minerals (MULTIVITAMIN PO) Take 1 tablet by mouth daily.    [provider]  naproxen (NAPROSYN) 500 MG tablet Take 1 tablet (500 mg total) by mouth 2 (two) times daily. 06/16/20   Eustaquio Maize, PA-C  simvastatin (ZOCOR) 20 MG tablet Take 20 mg by mouth every Monday, Wednesday, and Friday.  10/19/19   [provider]  Tetrahydrozoline HCl (VISINE OP) Place 1 drop into both eyes daily as needed (dry eyes).    [provider]  traMADol (ULTRAM) 50 MG tablet Take 1 tablet (50 mg total) by mouth every 6 (six) hours as needed. 08/08/20   Evalee Jefferson, PA-C    Allergies    Sulfonamide derivatives and Latex  Review of Systems   Review of Systems  Musculoskeletal: Positive for arthralgias and joint swelling. Negative for myalgias.  Skin: Negative.  Negative for color change.  Neurological: Negative for weakness and numbness.  All other systems reviewed and are  negative.   Physical Exam Updated Vital Signs BP 127/76 (BP Location: Left Arm)   Pulse 77   Temp 98.6 F (37 C) (Oral)   Resp 18   Ht '5\' 3"'  (1.6 m)   Wt 83 kg   SpO2 94%   BMI 32.42 kg/m   Physical Exam Constitutional:      Appearance: She is well-developed and well-nourished.  HENT:     Head: Atraumatic.  Cardiovascular:     Comments: Pulses equal bilaterally Musculoskeletal:        General: Swelling and tenderness present. No deformity.     Cervical back: Normal range of motion.     Comments: ttp at right thumb mcp joint.  No joint instability. Distal sensation intact.   Skin:    General: Skin is warm and dry.     Capillary Refill: Capillary refill takes less than 2 seconds.  Neurological:     General: No focal deficit present.     Mental Status: She is alert.     Sensory: No sensory deficit.     Deep Tendon Reflexes: Strength normal. Reflexes normal.  Psychiatric:        Mood and Affect: Mood and affect normal.     ED Results / Procedures / Treatments   Labs (all labs ordered are listed, but only abnormal results are displayed) Labs Reviewed - No data to display  EKG None  Radiology DG Hand Complete Right  Result Date: 08/08/2020 CLINICAL DATA:  Status post fall. EXAM: RIGHT HAND - COMPLETE 3+ VIEW COMPARISON:  None. FINDINGS: There is no evidence of an acute fracture or dislocation. There is no evidence of arthropathy or other focal bone abnormality. Soft tissues are unremarkable. IMPRESSION: Negative. Electronically Signed   By: Virgina Norfolk M.D.   On: 08/08/2020 22:40    Procedures Procedures (including critical care time)  Medications Ordered in ED Medications  traMADol (ULTRAM) tablet 50 mg (50 mg Oral Given 08/08/20 2222)    ED Course  I have reviewed the triage vital signs and the nursing notes.  Pertinent labs & imaging results that were available during my care of the patient were reviewed by me and considered in my medical decision  making (see chart for details).    MDM Rules/Calculators/A&P                          Patient's imaging was reviewed and discussed with patient.  No fracture or dislocation.  Suspect this is a mild sprain of her MCP joint of her thumb.  She was put in a thumb spica splint, discussed RICE therapy.  She was given referral to orthopedics for as needed follow-up over the next 10 to 14 days if her symptoms are not improving with this treatment plan. Final Clinical Impression(s) / ED Diagnoses Final diagnoses:  Sprain of metacarpophalangeal (MCP) joint of right thumb, initial encounter    Rx / DC Orders ED Discharge Orders         Ordered    traMADol (ULTRAM) 50 MG tablet  Every 6 hours PRN        08/08/20 2246           Evalee Jefferson, PA-C 08/08/20 2251    Milton Ferguson, MD 08/09/20 1032

## 2020-08-08 NOTE — ED Triage Notes (Signed)
Pt to the ED after a fall yesterday. Pt c/o right hand pain.

## 2020-08-22 ENCOUNTER — Encounter (HOSPITAL_COMMUNITY): Payer: Self-pay | Admitting: *Deleted

## 2020-08-22 ENCOUNTER — Emergency Department (HOSPITAL_COMMUNITY)
Admission: EM | Admit: 2020-08-22 | Discharge: 2020-08-23 | Disposition: A | Discharge status: Home / Self Care | Payer: BC Managed Care – PPO | Attending: Emergency Medicine | Admitting: Emergency Medicine

## 2020-08-22 ENCOUNTER — Other Ambulatory Visit: Payer: Self-pay

## 2020-08-22 ENCOUNTER — Emergency Department (HOSPITAL_COMMUNITY): Payer: BC Managed Care – PPO

## 2020-08-22 ENCOUNTER — Telehealth (HOSPITAL_COMMUNITY): Payer: Self-pay

## 2020-08-22 DIAGNOSIS — Z87891 Personal history of nicotine dependence: Secondary | ICD-10-CM | POA: Diagnosis not present

## 2020-08-22 DIAGNOSIS — Z794 Long term (current) use of insulin: Secondary | ICD-10-CM | POA: Diagnosis not present

## 2020-08-22 DIAGNOSIS — Z7984 Long term (current) use of oral hypoglycemic drugs: Secondary | ICD-10-CM | POA: Insufficient documentation

## 2020-08-22 DIAGNOSIS — Z7982 Long term (current) use of aspirin: Secondary | ICD-10-CM | POA: Diagnosis not present

## 2020-08-22 DIAGNOSIS — E1169 Type 2 diabetes mellitus with other specified complication: Secondary | ICD-10-CM | POA: Diagnosis not present

## 2020-08-22 DIAGNOSIS — Z91040 Latex allergy status: Secondary | ICD-10-CM | POA: Diagnosis not present

## 2020-08-22 DIAGNOSIS — Z79899 Other long term (current) drug therapy: Secondary | ICD-10-CM | POA: Diagnosis not present

## 2020-08-22 DIAGNOSIS — E7800 Pure hypercholesterolemia, unspecified: Secondary | ICD-10-CM | POA: Insufficient documentation

## 2020-08-22 DIAGNOSIS — I10 Essential (primary) hypertension: Secondary | ICD-10-CM | POA: Diagnosis not present

## 2020-08-22 DIAGNOSIS — R509 Fever, unspecified: Secondary | ICD-10-CM | POA: Diagnosis present

## 2020-08-22 DIAGNOSIS — U071 COVID-19: Secondary | ICD-10-CM | POA: Diagnosis not present

## 2020-08-22 LAB — BASIC METABOLIC PANEL
Anion gap: 9 (ref 5–15)
BUN: 15 mg/dL (ref 8–23)
CO2: 21 mmol/L — ABNORMAL LOW (ref 22–32)
Calcium: 8.9 mg/dL (ref 8.9–10.3)
Chloride: 103 mmol/L (ref 98–111)
Creatinine, Ser: 0.68 mg/dL (ref 0.44–1.00)
GFR, Estimated: >60 mL/min (ref >60)
Glucose, Bld: 217 mg/dL — ABNORMAL HIGH (ref 70–99)
Potassium: 4.4 mmol/L (ref 3.5–5.1)
Sodium: 133 mmol/L — ABNORMAL LOW (ref 135–145)

## 2020-08-22 LAB — CBC WITH DIFFERENTIAL/PLATELET
Abs Immature Granulocytes: 0.03 10*3/uL (ref 0.00–0.07)
Basophils Absolute: 0 10*3/uL (ref 0.0–0.1)
Basophils Relative: 0 %
Eosinophils Absolute: 0.1 10*3/uL (ref 0.0–0.5)
Eosinophils Relative: 2 %
HCT: 41.9 % (ref 36.0–46.0)
Hemoglobin: 14.6 g/dL (ref 12.0–15.0)
Immature Granulocytes: 0 %
Lymphocytes Relative: 15 %
Lymphs Abs: 1.2 10*3/uL (ref 0.7–4.0)
MCH: 28 pg (ref 26.0–34.0)
MCHC: 34.8 g/dL (ref 30.0–36.0)
MCV: 80.3 fL (ref 80.0–100.0)
Monocytes Absolute: 0.6 10*3/uL (ref 0.1–1.0)
Monocytes Relative: 7 %
Neutro Abs: 6.1 10*3/uL (ref 1.7–7.7)
Neutrophils Relative %: 76 %
Platelets: 324 10*3/uL (ref 150–400)
RBC: 5.22 MIL/uL — ABNORMAL HIGH (ref 3.87–5.11)
RDW: 14.6 % (ref 11.5–15.5)
WBC: 8.1 10*3/uL (ref 4.0–10.5)
nRBC: 0 % (ref 0.0–0.2)

## 2020-08-22 LAB — RESP PANEL BY RT-PCR (FLU A&B, COVID) ARPGX2
Influenza A by PCR: NEGATIVE
Influenza B by PCR: NEGATIVE
SARS Coronavirus 2 by RT PCR: POSITIVE — AB

## 2020-08-22 MED ORDER — IBUPROFEN 800 MG PO TABS
800.0000 mg | ORAL_TABLET | Freq: Once | ORAL | Status: AC
  Administered 2020-08-22: 800 mg via ORAL
  Filled 2020-08-22: qty 1

## 2020-08-22 MED ORDER — ACETAMINOPHEN 325 MG PO TABS
650.0000 mg | ORAL_TABLET | Freq: Once | ORAL | Status: AC
  Administered 2020-08-23: 650 mg via ORAL
  Filled 2020-08-22: qty 2

## 2020-08-22 NOTE — ED Triage Notes (Signed)
Pt with fever, HA, dry cough since yesterday.  Pt denies any known Covid exposure.

## 2020-08-22 NOTE — ED Notes (Signed)
Date and time results received: 08/22/20 2121  Test: COVID Critical Value: positive  Name of Provider Notified: Pilar Plate, MD  Orders Received? Or Actions Taken?: acknowledged

## 2020-08-22 NOTE — ED Notes (Signed)
Pt refused CXR due to monitor for diabetes to arm and is pt's last one.

## 2020-08-22 NOTE — ED Provider Notes (Signed)
[facility] EMERGENCY DEPARTMENT Provider Note   CSN: [id number] Arrival date & time: 08/22/20  2005     History Chief Complaint  Patient presents with  . Fever    Sarah [name] is a 60 y.o. female.  The history is provided by the patient.  Fever Severity:  Moderate Onset quality:  Gradual Duration:  1 day Timing:  Constant Progression:  Worsening Chronicity:  New Relieved by:  Nothing Worsened by:  Nothing Associated symptoms: chills, cough and diarrhea   Patient with history of diabetes presents with fever, headache, dry cough.  This is been present for 1 day. She reports she is fully vaccinated and boosted for COVID-19 No significant chest pain or shortness of breath is reported     Past Medical History:  Diagnosis Date  . Anxiety   . Diabetes mellitus   . Diverticulosis   . GERD (gastroesophageal reflux disease)   . Hypercholesteremia   . Hypertension     Patient Active Problem List   Diagnosis Date Noted  . Umbilical hernia without obstruction and without gangrene   . Special screening for malignant neoplasms, colon   . Diverticulosis of large intestine without diverticulitis   . History of palpitations in adulthood 07/02/2018  . Murmur, cardiac 07/02/2018  . History of chest pain 06/27/2018  . Essential hypertension 06/27/2018  . Trigger middle finger   . Diabetes mellitus type 2 in obese ([place]) 08/19/2015  . 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 Visit for routine gyn exam 08/19/2015  . Type 2 diabetes mellitus ([place]) 06/24/2013  . Rectal bleed 03/23/2012  . Hyperlipidemia 11/30/2009  . ANXIETY 11/30/2009  . CONSTIPATION 11/30/2009    Past Surgical History:  Procedure Laterality Date  . ABDOMINAL HYSTERECTOMY     complete  . CARDIAC CATHETERIZATION  2011  . COLONOSCOPY  01/04/2010   left sided transverse diverticula/two diminutive rectal polyps  . COLONOSCOPY  03/28/2012   Procedure: COLONOSCOPY;  Surgeon: Daneil Dolin, MD;   [facility];  Service: Endoscopy;  Laterality: N/A;  9:30  . COLONOSCOPY N/A 09/11/2018   Procedure: COLONOSCOPY;  Surgeon: Aviva Signs, MD;  Location: [facility];  Service: Gastroenterology;  Laterality: N/A;  . FOOT SURGERY     right-tendon repair  . laparoscopic sigmoid colectomy  02/01/10   Dr Jola Babinski  . TRIGGER FINGER RELEASE Left 03/2015  . TRIGGER FINGER RELEASE Right 02/25/2016   Procedure: RIGHT LONG TRIGGER FINGER RELEASE;  Surgeon: Carole Civil, MD;  Location: [facility];  Service: Orthopedics;  Laterality: Right;  . UMBILICAL HERNIA REPAIR N/A 02/05/2020   Procedure: UMBILICAL HERNIORRHAPY WITH MESH;  Surgeon: Aviva Signs, MD;  Location: [facility];  Service: General;  Laterality: N/A;     OB History    Gravida  1   Para  1   Term  1   Preterm      AB      Living  1     SAB      IAB      Ectopic      Multiple      Live Births              Family History  Problem Relation Age of Onset  . Colon cancer Maternal Grandfather   . Colon polyps Mother   . Diabetes Mother   . Hypertension Mother   . Heart attack Father   . Heart murmur Sister   . Hypertension Brother   .  Diabetes Son     Social History   Tobacco Use  . Smoking status: Former Smoker    Packs/day: 1.00    Years: 10.00    Pack years: 10.00    Types: Cigarettes    Quit date: 09/05/2006    Years since quitting: 13.9  . Smokeless tobacco: Never Used  Vaping Use  . Vaping Use: Never used  Substance Use Topics  . Alcohol use: Yes    Comment: occasional  . Drug use: No    Home Medications Prior to Admission medications   Medication Sig Start Date End Date Taking? Authorizing Provider  aspirin EC 81 MG tablet Take 81 mg by mouth daily.    [provider]  Continuous Blood Gluc Receiver ([place]) [place] by Does not apply route.    [provider]  escitalopram (LEXAPRO) 5 MG tablet Take 5 mg by mouth at bedtime.  07/22/19    [provider]  ibuprofen (ADVIL) 800 MG tablet Take 800 mg by mouth every 6 (six) hours as needed for moderate pain.    [provider]  Insulin Disposable Pump (OMNIPOD DASH SYSTEM) KIT by Does not apply route.    [provider]  LORazepam (ATIVAN) 0.5 MG tablet Take 0.5 mg by mouth 2 (two) times daily as needed for anxiety. 11/28/19   [provider]  losartan (COZAAR) 25 MG tablet Take 25 mg by mouth daily.    [provider]  Magnesium 400 MG TABS Take 400 mg by mouth daily.    [provider]  metFORMIN (GLUCOPHAGE-XR) 500 MG 24 hr tablet Take 500 mg by mouth 2 (two) times daily.  09/06/16   [provider]  methocarbamol (ROBAXIN) 500 MG tablet Take 1 tablet (500 mg total) by mouth 2 (two) times daily. 06/16/20   Alroy Bailiff, [place], PA-C  metoprolol tartrate (LOPRESSOR) 50 MG tablet Take 1 tablet (50 mg total) by mouth 2 (two) times daily. Patient taking differently: Take 50 mg by mouth 2 (two) times daily. Still taking metoprolol 07/26/19 06/16/21  Herminio Commons, MD  Multiple Vitamins-Minerals (MULTIVITAMIN PO) Take 1 tablet by mouth daily.    [provider]  naproxen (NAPROSYN) 500 MG tablet Take 1 tablet (500 mg total) by mouth 2 (two) times daily. 06/16/20   Eustaquio Maize, PA-C  simvastatin (ZOCOR) 20 MG tablet Take 20 mg by mouth every Monday, Wednesday, and Friday.  10/19/19   [provider]  Tetrahydrozoline HCl (VISINE OP) Place 1 drop into both eyes daily as needed (dry eyes).    [provider]  traMADol (ULTRAM) 50 MG tablet Take 1 tablet (50 mg total) by mouth every 6 (six) hours as needed. 08/08/20   Evalee Jefferson, PA-C    Allergies    Sulfonamide derivatives and Latex  Review of Systems   Review of Systems  Constitutional: Positive for chills, fatigue and fever.  Respiratory: Positive for cough. Negative for shortness of breath.   Gastrointestinal: Positive for diarrhea.  All  other systems reviewed and are negative.   Physical Exam Updated Vital Signs BP (!) 133/102 (BP Location: Right Arm)   Pulse (!) 107   Temp (!) 101.6 F (38.7 C) (Oral)   Resp 20   Ht 1.6 m ('5\' 3"' )   Wt 83 kg   SpO2 98%   BMI 32.42 kg/m   Physical Exam CONSTITUTIONAL: Well developed/well nourished HEAD: Normocephalic/atraumatic EYES: EOMI/PERRL ENMT: Mucous membranes moist NECK: supple no meningeal signs SPINE/BACK:entire spine  nontender CV: S1/S2 noted, no murmurs/rubs/gallops noted LUNGS: Lungs are clear to auscultation bilaterally, no apparent distress ABDOMEN: soft, nontender, no rebound or guarding, bowel sounds noted throughout abdomen GU:no cva tenderness NEURO: Pt is awake/alert/appropriate, moves all extremitiesx4.  No facial droop.   EXTREMITIES: pulses normal/equal, full ROM SKIN: warm, color normal PSYCH: no abnormalities of mood noted, alert and oriented to situation  ED Results / Procedures / Treatments   Labs (all labs ordered are listed, but only abnormal results are displayed) Labs Reviewed  RESP PANEL BY RT-PCR (FLU A&B, COVID) ARPGX2 - Abnormal; Notable for the following components:      Result Value   SARS Coronavirus 2 by RT PCR POSITIVE (*)    All other components within normal limits  CBC WITH DIFFERENTIAL/PLATELET - Abnormal; Notable for the following components:   RBC 5.22 (*)    All other components within normal limits  BASIC METABOLIC PANEL - Abnormal; Notable for the following components:   Sodium 133 (*)    CO2 21 (*)    Glucose, Bld 217 (*)    All other components within normal limits    EKG None  Radiology No results found.  Procedures Procedures   Medications Ordered in ED Medications  ibuprofen (ADVIL) tablet 800 mg (800 mg Oral Given 08/22/20 2026)  acetaminophen (TYLENOL) tablet 650 mg (650 mg Oral Given 08/23/20 0113)    ED Course  I have reviewed the triage vital signs and the nursing notes.  Pertinent labs  results  that were available during my care of the patient were reviewed by me and considered in my medical decision making (see chart for details).    MDM Rules/Calculators/A&P                          Patient stable.  No hypoxia.  She is in no acute distress.  She did refuse a chest x-ray.  However her lung sounds are clear and she is in no distress.  She was just recently boosted for COVID-19.  I suspect she will have a mild course, we did discuss strict return precautions. Referral for MAB infusion was placed  Sarah [name] was evaluated in Emergency Department on 08/22/2020 for the symptoms described in the history of present illness. She was evaluated in the context of the global COVID-19 pandemic, which necessitated consideration that the patient might be at risk for infection with the SARS-CoV-2 virus that causes COVID-19. Institutional protocols and algorithms that pertain to the evaluation of patients at risk for COVID-19 are in a state of rapid change based on information released by regulatory bodies including the CDC and federal and state organizations. These policies and algorithms were followed during the patient's care in the ED.  Final Clinical Impression(s) / ED Diagnoses Final diagnoses:  [id number]    Rx / DC Orders ED Discharge Orders    None       Ripley Fraise, MD 08/23/20 [phone]

## 2020-08-23 NOTE — ED Notes (Signed)
VS updated, pt medicated per MAR. Sitting on side of bed eating and drinking without complication. Dr. Bebe Shaggy made aware. No new orders at this time

## 2020-11-25 ENCOUNTER — Other Ambulatory Visit (HOSPITAL_COMMUNITY): Payer: Self-pay | Admitting: Internal Medicine

## 2020-11-25 DIAGNOSIS — Z1231 Encounter for screening mammogram for malignant neoplasm of breast: Secondary | ICD-10-CM

## 2020-11-30 ENCOUNTER — Ambulatory Visit (HOSPITAL_COMMUNITY)
Admission: RE | Admit: 2020-11-30 | Discharge: 2020-11-30 | Disposition: A | Discharge status: Home / Self Care | Payer: BC Managed Care – PPO | Source: Ambulatory Visit | Attending: Internal Medicine | Admitting: Internal Medicine

## 2020-11-30 DIAGNOSIS — Z1231 Encounter for screening mammogram for malignant neoplasm of breast: Secondary | ICD-10-CM | POA: Insufficient documentation

## 2021-01-03 DIAGNOSIS — K469 Unspecified abdominal hernia without obstruction or gangrene: Secondary | ICD-10-CM | POA: Insufficient documentation

## 2021-01-03 DIAGNOSIS — E669 Obesity, unspecified: Secondary | ICD-10-CM | POA: Insufficient documentation

## 2021-01-03 DIAGNOSIS — I700 Atherosclerosis of aorta: Secondary | ICD-10-CM | POA: Insufficient documentation

## 2021-01-03 DIAGNOSIS — Z8616 Personal history of COVID-19: Secondary | ICD-10-CM | POA: Insufficient documentation

## 2021-01-03 DIAGNOSIS — I472 Ventricular tachycardia, unspecified: Secondary | ICD-10-CM | POA: Insufficient documentation

## 2021-01-03 DIAGNOSIS — R7989 Other specified abnormal findings of blood chemistry: Secondary | ICD-10-CM | POA: Insufficient documentation

## 2021-01-03 DIAGNOSIS — G629 Polyneuropathy, unspecified: Secondary | ICD-10-CM | POA: Insufficient documentation

## 2021-01-04 ENCOUNTER — Other Ambulatory Visit (HOSPITAL_COMMUNITY): Payer: Self-pay | Admitting: Family Medicine

## 2021-01-04 DIAGNOSIS — R059 Cough, unspecified: Secondary | ICD-10-CM

## 2021-01-07 ENCOUNTER — Ambulatory Visit (HOSPITAL_COMMUNITY)
Admission: RE | Admit: 2021-01-07 | Discharge: 2021-01-07 | Disposition: A | Discharge status: Home / Self Care | Payer: BC Managed Care – PPO | Source: Ambulatory Visit | Attending: Family Medicine | Admitting: Family Medicine

## 2021-01-07 DIAGNOSIS — R059 Cough, unspecified: Secondary | ICD-10-CM | POA: Insufficient documentation

## 2021-01-27 ENCOUNTER — Other Ambulatory Visit: Payer: Self-pay

## 2021-01-27 ENCOUNTER — Encounter: Payer: Self-pay | Admitting: Orthopedic Surgery

## 2021-01-27 ENCOUNTER — Ambulatory Visit: Payer: BC Managed Care – PPO

## 2021-01-27 ENCOUNTER — Ambulatory Visit: Payer: BC Managed Care – PPO | Admitting: Orthopedic Surgery

## 2021-01-27 VITALS — BP 135/76 | HR 77 | Ht 63.0 in | Wt 193.0 lb

## 2021-01-27 DIAGNOSIS — M1812 Unilateral primary osteoarthritis of first carpometacarpal joint, left hand: Secondary | ICD-10-CM

## 2021-01-27 DIAGNOSIS — M79641 Pain in right hand: Secondary | ICD-10-CM

## 2021-01-27 DIAGNOSIS — M1811 Unilateral primary osteoarthritis of first carpometacarpal joint, right hand: Secondary | ICD-10-CM

## 2021-01-27 DIAGNOSIS — M65342 Trigger finger, left ring finger: Secondary | ICD-10-CM | POA: Diagnosis not present

## 2021-01-27 DIAGNOSIS — M79642 Pain in left hand: Secondary | ICD-10-CM

## 2021-01-27 MED ORDER — DICLOFENAC SODIUM 1 % EX GEL: 2.0000 g | Freq: Four times a day (QID) | CUTANEOUS | 5 refills | Status: AC

## 2021-01-27 NOTE — Patient Instructions (Signed)
Make a new appointment for injection left ring finger  Occupational Therapy for thumb splints for arthritis of the thumb  Voltaren gel 4 times a day

## 2021-01-27 NOTE — Progress Notes (Signed)
NEW PROBLEM//OFFICE VISIT  Summary assessment and plan:   Analy does not want to get the injection in her left ring finger for triggering.  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  Encounter Diagnoses  Name Primary?  . Bilateral hand pain   . Arthritis of carpometacarpal (CMC) joint of left thumb Yes  . Trigger finger, left ring finger     B. DATA ANALYSED:   IMAGING: Interpretation of images: 2 images were obtained in the office today left hand right hand mild arthritis CMC joint   Orders: Occupational Therapy thumb splints  Outside records reviewed: None   C. MANAGEMENT   As abov  Meds ordered this encounter  Medications  . diclofenac Sodium (VOLTAREN) 1 % GEL    Sig: Apply 2 g topically 4 (four) times daily.    Dispense:  2 g    Refill:  5    Chief Complaint  Patient presents with  . Hand Pain    Bilateral     HPI  BP 135/76   Pulse 77   Ht _0  (1.6 m)   Wt 193 lb (87.5 kg)   BMI 34.19 kg/m    General appearance: Well-developed well-nourished no gross deformities  Cardiovascular normal pulse and perfusion normal color without edema  Neurologically o sensation loss or deficits or pathologic reflexes  Psychological: Awake alert and oriented x3 mood and affect normal  Skin no lacerations or ulcerations no nodularity no palpable masses, no erythema or nodularity  Musculoskeletal:   Left ring finger tenderness over the A1 pulley decreased range of motion neurovascular exam intact good color capillary refill  Left thumb tenderness at the [facility] joint positive grind test very weak pinch neurovascular intact color capillary refill normal no skin abnormalities  Right thumb also tenderness at [facility] joint positive grind  test normal pinch neurovascular intact color capillary refill normal no skin abnormalities   ROS  Positive findings include night sweating ringing of the ears heart palpitations joint pain all other systems were reviewed and were negative Past Medical History:  Diagnosis Date  . Anxiety   . Diabetes mellitus   . Diverticulosis   . GERD (gastroesophageal reflux disease)   . Hypercholesteremia   . Hypertension     Past Surgical History:  Procedure Laterality Date  . ABDOMINAL HYSTERECTOMY     complete  . CARDIAC CATHETERIZATION  2011  . COLONOSCOPY  01/04/2010   left sided transverse diverticula/two diminutive rectal polyps  . COLONOSCOPY  03/28/2012   Procedure: COLONOSCOPY;  Surgeon: Daneil Dolin, MD;  [facility];  Service: Endoscopy;  Laterality: N/A;  9:30  . COLONOSCOPY N/A 09/11/2018   Procedure: COLONOSCOPY;  Surgeon: Aviva Signs, MD;  Location: [facility];  Service: Gastroenterology;  Laterality: N/A;  . FOOT SURGERY     right-tendon repair  . laparoscopic sigmoid colectomy  02/01/10   Dr Jola Babinski  . TRIGGER FINGER RELEASE Left 03/2015  . TRIGGER FINGER RELEASE Right 02/25/2016   Procedure: RIGHT LONG TRIGGER FINGER RELEASE;  Surgeon: Carole Civil, MD;  Location: [facility];  Service: Orthopedics;  Laterality: Right;  . UMBILICAL HERNIA REPAIR N/A 02/05/2020   Procedure: UMBILICAL HERNIORRHAPY WITH MESH;  Surgeon:  Aviva Signs, MD;  Location: [facility];  Service: General;  Laterality: N/A;    Family History  Problem Relation Age of Onset  . Colon cancer Maternal Grandfather   . Colon polyps Mother   . Diabetes Mother   . Hypertension Mother   . Heart attack Father   . Heart murmur Sister   . Hypertension Brother   . Diabetes Son    Social History   Tobacco Use  . Smoking status: Former Smoker    Packs/day: 1.00    Years: 10.00    Pack years: 10.00    Types: Cigarettes    Quit date: 09/05/2006    Years since quitting: 14.4  .  Smokeless tobacco: Never Used  Vaping Use  . Vaping Use: Never used  Substance Use Topics  . Alcohol use: Yes    Comment: occasional  . Drug use: No    Allergies  Allergen Reactions  . Sulfonamide Derivatives Itching  . Latex Itching, Rash and Other (See Comments)    Powdered only    Current Meds  Medication Sig  . aspirin EC 81 MG tablet Take 81 mg by mouth daily.  . Continuous Blood Gluc Receiver (Reasnor) DEVI by Does not apply route.  . diclofenac Sodium (VOLTAREN) 1 % GEL Apply 2 g topically 4 (four) times daily.  Marland Kitchen escitalopram (LEXAPRO) 5 MG tablet Take 5 mg by mouth at bedtime.   Marland Kitchen ibuprofen (ADVIL) 800 MG tablet Take 800 mg by mouth every 6 (six) hours as needed for moderate pain.  . Insulin Disposable Pump (OMNIPOD DASH SYSTEM) KIT by Does not apply route.  Marland Kitchen LORazepam (ATIVAN) 0.5 MG tablet Take 0.5 mg by mouth 2 (two) times daily as needed for anxiety.  Marland Kitchen losartan (COZAAR) 25 MG tablet Take 25 mg by mouth daily.  . Magnesium 400 MG TABS Take 400 mg by mouth daily.  . metFORMIN (GLUCOPHAGE-XR) 500 MG 24 hr tablet Take 500 mg by mouth 2 (two) times daily.   . methocarbamol (ROBAXIN) 500 MG tablet Take 1 tablet (500 mg total) by mouth 2 (two) times daily.  . metoprolol tartrate (LOPRESSOR) 50 MG tablet Take 1 tablet (50 mg total) by mouth 2 (two) times daily. (Patient taking differently: Take 50 mg by mouth 2 (two) times daily. Still taking metoprolol)  . Multiple Vitamins-Minerals (MULTIVITAMIN PO) Take 1 tablet by mouth daily.  . simvastatin (ZOCOR) 20 MG tablet Take 20 mg by mouth every Monday, Wednesday, and Friday.   . Tetrahydrozoline HCl (VISINE OP) Place 1 drop into both eyes daily as needed (dry eyes).  . traMADol (ULTRAM) 50 MG tablet Take 1 tablet (50 mg total) by mouth every 6 (six) hours as needed.  . [DISCONTINUED] naproxen (NAPROSYN) 500 MG tablet Take 1 tablet (500 mg total) by mouth 2 (two) times daily.    BP 135/76   Pulse 77   Ht _0   (1.6 m)   Wt 193 lb (87.5 kg)   BMI 34.19 kg/m   Physical Exam      Arther Abbott, MD  01/27/2021 12:05 PM

## 2021-01-28 ENCOUNTER — Other Ambulatory Visit: Payer: Self-pay

## 2021-02-01 ENCOUNTER — Other Ambulatory Visit: Payer: Self-pay

## 2021-02-01 ENCOUNTER — Encounter: Payer: Self-pay | Admitting: Orthopedic Surgery

## 2021-02-01 ENCOUNTER — Ambulatory Visit (INDEPENDENT_AMBULATORY_CARE_PROVIDER_SITE_OTHER): Payer: BC Managed Care – PPO | Admitting: Orthopedic Surgery

## 2021-02-01 DIAGNOSIS — M65342 Trigger finger, left ring finger: Secondary | ICD-10-CM

## 2021-02-01 NOTE — Patient Instructions (Signed)
If if doesn't work call me in 10 days

## 2021-02-01 NOTE — Progress Notes (Signed)
Chief Complaint  Patient presents with   Hand Pain    Left ring finger injection    Schedule injection left ring finger  Trigger finger injection  Diagnosis tenosynovitis left ring finger Procedure injection A1 pulley Medications lidocaine 1% 1 mL and Depo-Medrol 40 mg 1 mL Skin prep alcohol and ethyl chloride Verbal consent was obtained Timeout confirmed the injection site  After cleaning the skin with alcohol and anesthetizing the skin with ethyl chloride the A1 pulley was palpated and the injection was performed without complication  If no improvement after 10 days I recommend patient have surgery she will call us if things are not improving

## 2021-02-12 ENCOUNTER — Encounter (HOSPITAL_COMMUNITY): Payer: Self-pay

## 2021-02-12 ENCOUNTER — Ambulatory Visit (HOSPITAL_COMMUNITY): Payer: BC Managed Care – PPO | Attending: Orthopedic Surgery

## 2021-02-12 ENCOUNTER — Other Ambulatory Visit: Payer: Self-pay

## 2021-02-12 DIAGNOSIS — M25541 Pain in joints of right hand: Secondary | ICD-10-CM | POA: Diagnosis present

## 2021-02-12 DIAGNOSIS — M25542 Pain in joints of left hand: Secondary | ICD-10-CM | POA: Diagnosis present

## 2021-02-12 NOTE — Therapy (Signed)
[place], [place], [address] Phone: [phone]   Fax:  [phone]  Occupational Therapy Evaluation  Patient Details  Name: Sarah [name] MRN: [id number] Date of Birth: June 30, 1961 Referring Provider (OT): Arther Abbott, MD   Encounter Date: 02/12/2021   OT End of Session - 02/12/21 0921     Visit Number 1    Number of Visits 1    Authorization Type [place]    Authorization Time Period 20% co-insurance, No copay, No authorization required.    OT Start Time 0815    OT Stop Time 0902    OT Time Calculation (min) 47 min    Activity Tolerance Patient tolerated treatment well    Behavior During Therapy [facility] for tasks assessed/performed             Past Medical History:  Diagnosis Date   Anxiety    Diabetes mellitus    Diverticulosis    GERD (gastroesophageal reflux disease)    Hypercholesteremia    Hypertension     Past Surgical History:  Procedure Laterality Date   ABDOMINAL HYSTERECTOMY     complete   CARDIAC CATHETERIZATION  2011   COLONOSCOPY  01/04/2010   left sided transverse diverticula/two diminutive rectal polyps   COLONOSCOPY  03/28/2012   Procedure: COLONOSCOPY;  Surgeon: Daneil Dolin, MD;  [facility];  Service: Endoscopy;  Laterality: N/A;  9:30   COLONOSCOPY N/A 09/11/2018   Procedure: COLONOSCOPY;  Surgeon: Aviva Signs, MD;  [facility];  Service: Gastroenterology;  Laterality: N/A;   FOOT SURGERY     right-tendon repair   laparoscopic sigmoid colectomy  02/01/10   Dr Jola Babinski   TRIGGER FINGER RELEASE Left 03/2015   TRIGGER FINGER RELEASE Right 02/25/2016   Procedure: RIGHT LONG TRIGGER FINGER RELEASE;  Surgeon: Carole Civil, MD;  Location: [facility];  Service: Orthopedics;  Laterality: Right;   UMBILICAL HERNIA REPAIR N/A 02/05/2020   Procedure: UMBILICAL HERNIORRHAPY WITH MESH;  Surgeon: Aviva Signs, MD;  Location: [facility];  Service: General;  Laterality: N/A;    There were no vitals filed for this visit.   Subjective Assessment - 02/12/21 0820     Subjective  S: My left one is worse than my right.    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 Dr. Aline Brochure has referred patient to occupational therapy for evaluation and splint fabrication.    Patient Stated Goals to be able to complete tasks with less pain in thumbs.    Currently in Pain? Yes    Pain Score 5     Pain Location Finger (Comment which one)   thumbs   Pain Orientation Right;Left    Pain Descriptors / Indicators Aching;Numbness    Pain Type Acute pain    Pain Onset More than a month ago    Pain Frequency Occasional    Aggravating Factors  gripping, opening jars and containers.    Pain Relieving Factors refraining from activity that causes pain, voltaren cream    Effect of Pain on Daily Activities severe effect    Multiple Pain Sites No               [facility] OT Assessment - 02/12/21 0821       Assessment   Medical Diagnosis Bilateral CMC thumb splints    Referring Provider (OT) Arther Abbott, MD  Onset Date/Surgical Date --   a few months ago   Hand Dominance Right    Next MD Visit N/A    Prior Therapy N/A      Precautions   Precautions None      Restrictions   Weight Bearing Restrictions No      Balance Screen   Has the patient fallen in the past 6 months No      Home  Environment   Family/patient expects to be discharged to: Private residence      Prior Function   Level of Independence Independent    Vocation Full time employment    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      ADL   ADL comments Pt reports pain when attempting to grip items, open jars and containers, etc      Mobility   Mobility Status Independent      Written Expression   Dominant Hand Right      Vision - History   Baseline Vision Wears glasses all the time       Cognition   Overall Cognitive Status Within Functional Limits for tasks assessed      Observation/Other Assessments   Focus on Therapeutic Outcomes (FOTO)  N/A      Coordination   Gross Motor Movements are Fluid and Coordinated Yes    Fine Motor Movements are Fluid and Coordinated Yes    Coordination and Movement Description Slow and cautious A/ROM of bilateral thumbs during ROM assessment.      ROM / Strength   AROM / PROM / Strength AROM      AROM   Overall AROM Comments Bilateral thumb and hand ROM is WFL in all ranges. Functional gross grasp noted although reports of a pain noted.                      OT Treatments/Exercises (OP) - 02/12/21 0919       Splinting   Splinting Bilateral CMC thumb splints fabricated using one 1 inch straps to secure.                   OT Education - 02/12/21 0920     Education Details Education provided regarding wearing schedule, precautions, care management, and donning/doffing technique. Provided extra strapping and sockette's for comfort.    Person(s) Educated Patient    Methods Explanation;Demonstration;Handout    Comprehension Verbalized understanding              OT Short Term Goals - 02/12/21 0926       OT SHORT TERM GOAL #1   Title Patient will be educated and verbalize and/or demonstrate understanding of splint care management, wearing schedule, precautions, and donning/doffing technique in order to safely utilize them to provide stability to the [facility] joint of bilateral thumbs to complete desired ADL tasks with increased comfort level.    Time 1    Period Days    Status Achieved    Target Date 02/12/21                      Plan - 02/12/21 1308     Clinical Impression Statement A: Patient is a 60 y/o female with bilateral CMC thumb arthritis that causes increased pain when completing functional tasks.    OT Occupational Profile and History Problem Focused Assessment - Including  review of records relating to presenting problem    Occupational performance deficits (Please refer to evaluation  for details): ADL's;Leisure;Rest and Sleep;IADL's    Body Structure / Function / Physical Skills ADL;Pain    Rehab Potential Excellent    Clinical Decision Making Limited treatment options, no task modification necessary    Comorbidities Affecting Occupational Performance: None    Modification or Assistance to Complete Evaluation  No modification of tasks or assist necessary to complete eval    OT Frequency One time visit    OT Treatment/Interventions Splinting    Plan P: One time visit for splint fabrication. Pt will contact clinic/OT if any adjustments are needed. Pt to follow up with Dr. Aline Brochure as needed.    OT Home Exercise Plan N/A    Consulted and Agree with Plan of Care Patient             Patient will benefit from skilled therapeutic intervention in order to improve the following deficits and impairments:   Body Structure / Function / Physical Skills: ADL, Pain       Visit Diagnosis: Pain in joint of left hand - Plan: Ot plan of care cert/re-cert  Pain in joint of right hand - Plan: Ot plan of care cert/re-cert    Problem List Patient Active Problem List   Diagnosis Date Noted   Hernia of abdominal cavity 01/03/2021   Elevated LFTs 01/03/2021   History of severe acute respiratory syndrome coronavirus 2 (SARS-CoV-2) disease 01/03/2021   Hardening of the aorta (main artery of the heart) ([place]) 01/03/2021   Neuropathy 01/03/2021   Obesity 01/03/2021   Ventricular tachycardia ([place]) 48/25/0037   Umbilical hernia without obstruction and without gangrene    Special screening for malignant neoplasms, colon    Diverticulosis of large intestine without diverticulitis    History of palpitations in adulthood 07/02/2018   Murmur, cardiac 07/02/2018   History of chest pain 06/27/2018   Essential hypertension 06/27/2018   Trigger middle finger    Diabetes  mellitus type 2 in obese ([place]) 08/19/2015   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

## 2021-02-12 NOTE — Patient Instructions (Signed)
Your Splint This splint should initially be fitted by a healthcare practitioner.  The healthcare practitioner is responsible for providing wearing instructions and precautions to the patient, other healthcare practitioners and care provider involved in the patient's care.  This splint was custom made for you. Please read the following instructions to learn about wearing and caring for your splint.  Precautions Should your splint cause any of the following problems, remove the splint immediately and contact your therapist/physician. Swelling Severe Pain Pressure Areas Stiffness Numbness  Do not wear your splint while operating machinery unless it has been fabricated for that purpose.  When To Wear Your Splint Where your splint according to your therapist/physician instructions. Daytime during functional tasks that normally cause pain in your thumbs. You may wear them at night if needed.   Care and Cleaning of Your Splint Keep your splint away from open flames. Your splint will lose its shape in temperatures over 135 degrees Farenheit, ( in car windows, near radiators, ovens or in hot water).  Never make any adjustments to your splint, if the splint needs adjusting remove it and make an appointment to see your therapist. Your splint, including the cushion liner may be cleaned with soap and lukewarm water.  Do not immerse in hot water over 135 degrees Farenheit. Straps may be washed with soap and water, but do not moisten the self-adhesive portion. For ink or hard to remove spots use a scouring cleanser which contains chlorine.  Rinse the splint thoroughly after using chlorine cleanser.

## 2021-04-04 ENCOUNTER — Encounter (HOSPITAL_COMMUNITY): Payer: Self-pay

## 2021-04-04 ENCOUNTER — Emergency Department (HOSPITAL_COMMUNITY): Payer: BC Managed Care – PPO

## 2021-04-04 ENCOUNTER — Emergency Department (HOSPITAL_COMMUNITY)
Admission: EM | Admit: 2021-04-04 | Discharge: 2021-04-04 | Disposition: A | Discharge status: Home / Self Care | Payer: BC Managed Care – PPO | Attending: Emergency Medicine | Admitting: Emergency Medicine

## 2021-04-04 ENCOUNTER — Other Ambulatory Visit: Payer: Self-pay

## 2021-04-04 DIAGNOSIS — R1084 Generalized abdominal pain: Secondary | ICD-10-CM | POA: Insufficient documentation

## 2021-04-04 DIAGNOSIS — E1165 Type 2 diabetes mellitus with hyperglycemia: Secondary | ICD-10-CM | POA: Diagnosis not present

## 2021-04-04 DIAGNOSIS — I10 Essential (primary) hypertension: Secondary | ICD-10-CM | POA: Insufficient documentation

## 2021-04-04 DIAGNOSIS — R109 Unspecified abdominal pain: Secondary | ICD-10-CM | POA: Diagnosis present

## 2021-04-04 DIAGNOSIS — R509 Fever, unspecified: Secondary | ICD-10-CM | POA: Insufficient documentation

## 2021-04-04 DIAGNOSIS — Z7982 Long term (current) use of aspirin: Secondary | ICD-10-CM | POA: Insufficient documentation

## 2021-04-04 DIAGNOSIS — Z79899 Other long term (current) drug therapy: Secondary | ICD-10-CM | POA: Insufficient documentation

## 2021-04-04 DIAGNOSIS — K5900 Constipation, unspecified: Secondary | ICD-10-CM | POA: Insufficient documentation

## 2021-04-04 DIAGNOSIS — Z87891 Personal history of nicotine dependence: Secondary | ICD-10-CM | POA: Insufficient documentation

## 2021-04-04 DIAGNOSIS — Z91040 Latex allergy status: Secondary | ICD-10-CM | POA: Diagnosis not present

## 2021-04-04 DIAGNOSIS — Z794 Long term (current) use of insulin: Secondary | ICD-10-CM | POA: Diagnosis not present

## 2021-04-04 DIAGNOSIS — N939 Abnormal uterine and vaginal bleeding, unspecified: Secondary | ICD-10-CM | POA: Diagnosis not present

## 2021-04-04 DIAGNOSIS — R110 Nausea: Secondary | ICD-10-CM | POA: Diagnosis not present

## 2021-04-04 LAB — COMPREHENSIVE METABOLIC PANEL
ALT: 52 U/L — ABNORMAL HIGH (ref 0–44)
AST: 46 U/L — ABNORMAL HIGH (ref 15–41)
Albumin: 3.5 g/dL (ref 3.5–5.0)
Alkaline Phosphatase: 84 U/L (ref 38–126)
Anion gap: 7 (ref 5–15)
BUN: 16 mg/dL (ref 8–23)
CO2: 27 mmol/L (ref 22–32)
Calcium: 8.6 mg/dL — ABNORMAL LOW (ref 8.9–10.3)
Chloride: 100 mmol/L (ref 98–111)
Creatinine, Ser: 0.83 mg/dL (ref 0.44–1.00)
GFR, Estimated: >60 mL/min (ref >60)
Glucose, Bld: 426 mg/dL — ABNORMAL HIGH (ref 70–99)
Potassium: 4.4 mmol/L (ref 3.5–5.1)
Sodium: 134 mmol/L — ABNORMAL LOW (ref 135–145)
Total Bilirubin: 0.6 mg/dL (ref 0.3–1.2)
Total Protein: 7.9 g/dL (ref 6.5–8.1)

## 2021-04-04 LAB — URINALYSIS, ROUTINE W REFLEX MICROSCOPIC
Bacteria, UA: NONE SEEN
Bilirubin Urine: NEGATIVE
Glucose, UA: >=500 mg/dL — AB
Hgb urine dipstick: NEGATIVE
Ketones, ur: 5 mg/dL — AB
Leukocytes,Ua: NEGATIVE
Nitrite: NEGATIVE
Protein, ur: NEGATIVE mg/dL
Specific Gravity, Urine: 1.025 (ref 1.005–1.030)
pH: 6 (ref 5.0–8.0)

## 2021-04-04 LAB — CBC
HCT: 38.2 % (ref 36.0–46.0)
Hemoglobin: 13.6 g/dL (ref 12.0–15.0)
MCH: 29.1 pg (ref 26.0–34.0)
MCHC: 35.6 g/dL (ref 30.0–36.0)
MCV: 81.6 fL (ref 80.0–100.0)
Platelets: 363 10*3/uL (ref 150–400)
RBC: 4.68 MIL/uL (ref 3.87–5.11)
RDW: 14.3 % (ref 11.5–15.5)
WBC: 9.1 10*3/uL (ref 4.0–10.5)
nRBC: 0 % (ref 0.0–0.2)

## 2021-04-04 LAB — LIPASE, BLOOD: Lipase: 46 U/L (ref 11–51)

## 2021-04-04 MED ORDER — FLUCONAZOLE 150 MG PO TABS: 150.0000 mg | ORAL_TABLET | Freq: Once | ORAL | 0 refills | Status: AC

## 2021-04-04 MED ORDER — DICYCLOMINE HCL 20 MG PO TABS: 20.0000 mg | ORAL_TABLET | Freq: Two times a day (BID) | ORAL | 0 refills | Status: DC

## 2021-04-04 MED ORDER — FLUCONAZOLE 150 MG PO TABS
150.0000 mg | ORAL_TABLET | Freq: Once | ORAL | Status: AC
  Administered 2021-04-04: 150 mg via ORAL
  Filled 2021-04-04: qty 1

## 2021-04-04 MED ORDER — KETOROLAC TROMETHAMINE 30 MG/ML IJ SOLN
30.0000 mg | Freq: Once | INTRAMUSCULAR | Status: AC
  Administered 2021-04-04: 30 mg via INTRAVENOUS
  Filled 2021-04-04: qty 1

## 2021-04-04 MED ORDER — ACETAMINOPHEN 500 MG PO TABS
1000.0000 mg | ORAL_TABLET | Freq: Once | ORAL | Status: AC
  Administered 2021-04-04: 1000 mg via ORAL
  Filled 2021-04-04: qty 2

## 2021-04-04 MED ORDER — IOHEXOL 350 MG/ML SOLN
85.0000 mL | Freq: Once | INTRAVENOUS | Status: AC | PRN
  Administered 2021-04-04: 85 mL via INTRAVENOUS

## 2021-04-04 NOTE — ED Provider Notes (Signed)
[facility] EMERGENCY DEPARTMENT Provider Note   CSN: [id number] Arrival date & time: 04/04/21  1643     History Chief Complaint  Patient presents with   Abdominal Pain    Sarah [name] is a 60 y.o. female with a past medical history of diabetes mellitus, diverticulosis and GERD presenting with a complaint of abdominal pain for 1 week.  Ports that everything became worse this morning at church.  States that she has had abdominal cramping that reminds her of her previous diverticulitis.  Denies any vomiting or diarrhea, but endorses nausea.  Patient also reports having dysuria and vaginal discharge that "looks like a yeast infection."  S-P total hysterectomy.  She has had 2 rounds of prednisone in the past 2 months for cough/bronchitis.  Finished her prednisone 2 weeks ago.  Says that she has been regularly checking her BG at home and getting numbers in the 300s to 400s.  This has been happening since she was on the steroids.  Says that she is due to see an endocrinologist this Tuesday, because her primary care provider believes that her OmniPod is not giving her as much insulin as she needs.  States that she remove the OmniPod in case she was to need an x-ray today.  Endorses feeling feverish and measuring a temperature of 99 on Friday however she also received her COVID booster that day.     Abdominal Pain Associated symptoms: chills, constipation, cough, fever, nausea and vaginal discharge   Associated symptoms: no chest pain, no diarrhea, no dysuria, no hematuria, no shortness of breath, no sore throat, no vaginal bleeding and no vomiting       Past Medical History:  Diagnosis Date   Anxiety    Diabetes mellitus    Diverticulosis    GERD (gastroesophageal reflux disease)    Hypercholesteremia    Hypertension     Patient Active Problem List   Diagnosis Date Noted   Hernia of abdominal cavity 01/03/2021   Elevated LFTs 01/03/2021   History of severe acute respiratory syndrome  coronavirus 2 (SARS-CoV-2) disease 01/03/2021   Hardening of the aorta (main artery of the heart) ([place]) 01/03/2021   Neuropathy 01/03/2021   Obesity 01/03/2021   Ventricular tachycardia ([place]) 94/80/1655   Umbilical hernia without obstruction and without gangrene    Special screening for malignant neoplasms, colon    Diverticulosis of large intestine without diverticulitis    History of palpitations in adulthood 07/02/2018   Murmur, cardiac 07/02/2018   History of chest pain 06/27/2018   Essential hypertension 06/27/2018   Trigger middle finger    Diabetes mellitus type 2 in obese ([place]) 08/19/2015   Herpes simplex 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 08/19/2015   Visit for routine gyn exam 08/19/2015   Type 2 diabetes mellitus ([place]) 06/24/2013   Rectal bleed 03/23/2012   Hyperlipidemia 11/30/2009   ANXIETY 11/30/2009   CONSTIPATION 11/30/2009    Past Surgical History:  Procedure Laterality Date   ABDOMINAL HYSTERECTOMY     complete   CARDIAC CATHETERIZATION  2011   COLONOSCOPY  01/04/2010   left sided transverse diverticula/two diminutive rectal polyps   COLONOSCOPY  03/28/2012   Procedure: COLONOSCOPY;  Surgeon: Daneil Dolin, MD;  Location: [facility] ENDO SUITE;  Service: Endoscopy;  Laterality: N/A;  9:30   COLONOSCOPY N/A 09/11/2018   Procedure: COLONOSCOPY;  Surgeon: Aviva Signs, MD;  Location: [facility] ENDO SUITE;  Service: Gastroenterology;  Laterality: N/A;   FOOT SURGERY     right-tendon repair  laparoscopic sigmoid colectomy  02/01/10   Dr Jenkins-diverticulitis   TRIGGER FINGER RELEASE Left 03/2015   TRIGGER FINGER RELEASE Right 02/25/2016   Procedure: RIGHT LONG TRIGGER FINGER RELEASE;  Surgeon: Carole Civil, MD;  Location: [facility] ORS;  Service: Orthopedics;  Laterality: Right;   UMBILICAL HERNIA REPAIR N/A 02/05/2020   Procedure: UMBILICAL HERNIORRHAPY WITH MESH;  Surgeon: Aviva Signs, MD;  Location: [facility] ORS;  Service: General;  Laterality: N/A;     OB History     Gravida  1    Para  1   Term  1   Preterm      AB      Living  1      SAB      IAB      Ectopic      Multiple      Live Births              Family History  Problem Relation Age of Onset   Colon cancer Maternal Grandfather    Colon polyps Mother    Diabetes Mother    Hypertension Mother    Heart attack Father    Heart murmur Sister    Hypertension Brother    Diabetes Son     Social History   Tobacco Use   Smoking status: Former    Packs/day: 1.00    Years: 10.00    Pack years: 10.00    Types: Cigarettes    Quit date: 09/05/2006    Years since quitting: 14.5   Smokeless tobacco: Never  Vaping Use   Vaping Use: Never used  Substance Use Topics   Alcohol use: Yes    Comment: occasional   Drug use: No    Home Medications Prior to Admission medications   Medication Sig Start Date End Date Taking? Authorizing Provider  aspirin EC 81 MG tablet Take 81 mg by mouth daily.    [provider]  Continuous Blood Gluc Receiver ([place]) [place] by Does not apply route.    [provider]  diclofenac Sodium (VOLTAREN) 1 % GEL Apply 2 g topically 4 (four) times daily. 01/27/21   Carole Civil, MD  escitalopram (LEXAPRO) 5 MG tablet Take 5 mg by mouth at bedtime.  07/22/19   [provider]  ibuprofen (ADVIL) 800 MG tablet Take 800 mg by mouth every 6 (six) hours as needed for moderate pain.    [provider]  Insulin Disposable Pump (OMNIPOD DASH SYSTEM) KIT by Does not apply route.    [provider]  LORazepam (ATIVAN) 0.5 MG tablet Take 0.5 mg by mouth 2 (two) times daily as needed for anxiety. 11/28/19   [provider]  losartan (COZAAR) 25 MG tablet Take 25 mg by mouth daily.    [provider]  Magnesium 400 MG TABS Take 400 mg by mouth daily.    [provider]  metFORMIN (GLUCOPHAGE-XR) 500 MG 24 hr tablet Take 500 mg by mouth 2 (two) times daily.  09/06/16   [provider]   methocarbamol (ROBAXIN) 500 MG tablet Take 1 tablet (500 mg total) by mouth 2 (two) times daily. 06/16/20   Alroy Bailiff, [place], PA-C  metoprolol tartrate (LOPRESSOR) 50 MG tablet Take 1 tablet (50 mg total) by mouth 2 (two) times daily. Patient taking differently: Take 50 mg by mouth 2 (two) times daily. Still taking metoprolol 07/26/19 06/16/21  Herminio Commons, MD  Multiple Vitamins-Minerals (MULTIVITAMIN PO) Take 1 tablet by mouth  daily.    [provider]  simvastatin (ZOCOR) 20 MG tablet Take 20 mg by mouth every Monday, Wednesday, and Friday.  10/19/19   [provider]  Tetrahydrozoline HCl (VISINE OP) Place 1 drop into both eyes daily as needed (dry eyes).    [provider]  traMADol (ULTRAM) 50 MG tablet Take 1 tablet (50 mg total) by mouth every 6 (six) hours as needed. 08/08/20   Evalee Jefferson, PA-C    Allergies    Sulfonamide derivatives and Latex  Review of Systems   Review of Systems  Constitutional:  Positive for chills and fever.  HENT:  Negative for ear pain, sore throat and trouble swallowing.   Eyes:  Negative for pain and visual disturbance.  Respiratory:  Positive for cough. Negative for shortness of breath and wheezing.   Cardiovascular:  Negative for chest pain and palpitations.  Gastrointestinal:  Positive for abdominal pain, constipation and nausea. Negative for diarrhea and vomiting.  Endocrine: Negative for polyphagia and polyuria.  Genitourinary:  Positive for flank pain, pelvic pain and vaginal discharge. Negative for difficulty urinating, dysuria, hematuria, urgency and vaginal bleeding.  Musculoskeletal:  Negative for arthralgias and back pain.  Skin:  Negative for color change and rash.  Neurological:  Negative for dizziness, seizures, syncope, light-headedness and headaches.  All other systems reviewed and are negative.  Physical Exam Updated Vital Signs BP 118/82 (BP Location: Right Arm)   Pulse 90   Temp 98.6 F (37 C)  (Oral)   Resp 17   Ht _0  (1.6 m)   Wt 88.5 kg   SpO2 96%   BMI 34.54 kg/m   Physical Exam Vitals and nursing note reviewed.  Constitutional:      Appearance: Normal appearance. She is obese.  HENT:     Head: Normocephalic and atraumatic.  Eyes:     General: No scleral icterus.    Conjunctiva/sclera: Conjunctivae normal.  Cardiovascular:     Rate and Rhythm: Normal rate and regular rhythm.     Heart sounds: No murmur heard. Pulmonary:     Effort: Pulmonary effort is normal. No respiratory distress.  Abdominal:     General: Abdomen is flat. Bowel sounds are normal. There is no distension.     Palpations: Abdomen is soft.     Tenderness: There is abdominal tenderness in the right lower quadrant and periumbilical area.     Hernia: No hernia is present.  Skin:    General: Skin is warm and dry.     Findings: No rash.  Neurological:     Mental Status: She is alert.  Psychiatric:        Mood and Affect: Mood normal. Mood is not anxious.    ED Results / Procedures / Treatments   Labs (all labs ordered are listed, but only abnormal results are displayed) Labs Reviewed  COMPREHENSIVE METABOLIC PANEL - Abnormal; Notable for the following components:      Result Value   Sodium 134 (*)    Glucose, Bld 426 (*)    Calcium 8.6 (*)    AST 46 (*)    ALT 52 (*)    All other components within normal limits  WET PREP, GENITAL  LIPASE, BLOOD  CBC  URINALYSIS, ROUTINE W REFLEX MICROSCOPIC    EKG None  Radiology CT ABDOMEN PELVIS W CONTRAST  Result Date: 04/04/2021 CLINICAL DATA:  Acute abdominal pain. EXAM: CT ABDOMEN AND PELVIS WITH CONTRAST TECHNIQUE: Multidetector CT imaging of the abdomen and pelvis  was performed using the standard protocol following bolus administration of intravenous contrast. CONTRAST:  36m OMNIPAQUE IOHEXOL 350 MG/ML SOLN COMPARISON:  CT abdomen dated 06/16/2020 FINDINGS: Lower chest: No acute abnormality. Hepatobiliary: No focal liver abnormality is  seen. Gallbladder is unremarkable, decompressed. No bile duct dilatation seen. Pancreas: Unremarkable. No pancreatic ductal dilatation or surrounding inflammatory changes. Spleen: Normal in size without focal abnormality. Adrenals/Urinary Tract: Adrenal glands appear normal. Kidneys are unremarkable without mass, stone or hydronephrosis. No perinephric fluid. No ureteral or bladder calculi are identified. Bladder appears normal. Stomach/Bowel: No dilated large or small bowel loops. No evidence of bowel wall inflammation. Diverticulosis throughout the majority of the colon, but no focal inflammatory change to suggest acute diverticulitis at this time. Surgical anastomosis at the upper sigmoid colon, compatible with a previous partial colon resection. No obstruction or other surgical complicating feature at this level. Appendix is not seen but there are no inflammatory changes about the cecum to suggest acute appendicitis. Stomach is unremarkable. Vascular/Lymphatic: Aortic atherosclerosis. No acute-appearing vascular abnormality. No enlarged lymph nodes seen in the abdomen or pelvis. Reproductive: Presumed hysterectomy. No adnexal mass or free fluid seen. Other: No free fluid or abscess collection seen. No free intraperitoneal air. Musculoskeletal: Mild degenerative spondylosis of the slightly scoliotic thoracolumbar spine. No acute-appearing osseous abnormality. IMPRESSION: 1. No acute findings within the abdomen or pelvis. No bowel obstruction or evidence of bowel wall inflammation. No evidence of acute solid organ abnormality. No renal or ureteral calculi. 2. Colonic diverticulosis without evidence of acute diverticulitis at this time. Aortic Atherosclerosis (ICD10-I70.0). Electronically Signed   By: SFranki CabotM.D.   On: 04/04/2021 19:44    Procedures Procedures   Medications Ordered in ED Medications  ketorolac (TORADOL) 30 MG/ML injection 30 mg (30 mg Intravenous Given 04/04/21 2011)  iohexol  (OMNIPAQUE) 350 MG/ML injection 85 mL (85 mLs Intravenous Contrast Given 04/04/21 1930)  fluconazole (DIFLUCAN) tablet 150 mg (150 mg Oral Given 04/04/21 2015)  acetaminophen (TYLENOL) tablet 1,000 mg (1,000 mg Oral Given 04/04/21 2015)    ED Course  I have reviewed the triage vital signs and the nursing notes.  Pertinent labs & imaging results that were available during my care of the patient were reviewed by me and considered in my medical decision making (see chart for details).  I evaluated this patient who was in no acute distress.  Occasionally she would wince due to flank cramping.    MDM Rules/Calculators/A&P                         Patient is a 61year old woman with a history of diabetes mellitus, diverticulosis, GERD who presented with a complaint of abdominal pain.  She also reported a difficulty controlling her blood glucose due to recent steroid use and issues with her OmniPod.  Patient glucose today was in the 400s with glucose in her urine, however because patient stated that this is where she has been running at home and that her primary care provider has been trying to assist, I did not pursue treatment for her hyperglycemia.  Patient without symptoms of DKA or HHS.  Patient also reported that she will be seeing endocrinology on Tuesday to discuss her insulin dosing and function of her OmniPod.  I believe the patient to be stable enough for hospitalist follow-up Tuesday morning.  Patient denied any concern for STI.  No new partners, and says her discharge is similar to past yeast infections.  I will defer  pelvic exam and treat with Diflucan empirically.  CT scan revealed no acute abnormalities.  Diverticulosis noted without concern for diverticulitis.  No stones or obstructions.  Due to patient's negative imaging, waxing and waning pain and description of "cramping" I suspect this to be a nonacute GI issue.  Will discharge patient with Bentyl.  Patient agreeable to discharge.   Understands the importance of following up about her blood sugar on Tuesday with endocrinology.  Reports understanding of symptoms of diverticulitis and reasons for follow-up.  Final Clinical Impression(s) / ED Diagnoses Final diagnoses:  Generalized abdominal pain  Hyperglycemia due to diabetes mellitus ([place])    Rx / DC Orders Results and diagnoses were explained to the patient. Return precautions discussed in full. Patient had no additional questions and expressed complete understanding.     Darliss Ridgel 04/04/21 2055    Noemi Chapel, MD 04/05/21 [phone]

## 2021-04-04 NOTE — ED Provider Notes (Signed)
Medical screening examination/treatment/procedure(s) were conducted as a shared visit with non-physician practitioner(s) and myself.  I personally evaluated the patient during the encounter.  Clinical Impression:   Final diagnoses:  Generalized abdominal pain  Hyperglycemia due to diabetes mellitus (HCC)      Hx of divertic, Has pain X 1 week Ttp is in the mid abdomen Pain in RLQ as well Labs normal VS normal CT pending  CT scan is reassuring showing no signs of acute intra-abdominal pathology, stable for discharge   Noemi Chapel, MD 04/05/21 [phone]

## 2021-04-04 NOTE — Discharge Instructions (Addendum)
Take your second dose of fluconazole in 1 week.   Follow-up with pcp for cramping and attend your endocrinology appointment on Tuesday.

## 2021-04-04 NOTE — ED Triage Notes (Signed)
Pt. States their abdomen hurts and they have cramping in their sides.

## 2021-04-06 ENCOUNTER — Other Ambulatory Visit: Payer: Self-pay

## 2021-04-06 ENCOUNTER — Encounter: Payer: Self-pay | Admitting: Endocrinology

## 2021-04-06 ENCOUNTER — Ambulatory Visit (INDEPENDENT_AMBULATORY_CARE_PROVIDER_SITE_OTHER): Payer: BC Managed Care – PPO | Admitting: Endocrinology

## 2021-04-06 VITALS — BP 124/66 | HR 69 | Ht 63.0 in | Wt 194.2 lb

## 2021-04-06 DIAGNOSIS — Z794 Long term (current) use of insulin: Secondary | ICD-10-CM

## 2021-04-06 DIAGNOSIS — E119 Type 2 diabetes mellitus without complications: Secondary | ICD-10-CM | POA: Diagnosis not present

## 2021-04-06 LAB — POCT GLYCOSYLATED HEMOGLOBIN (HGB A1C): Hemoglobin A1C: 11.9 % — AB (ref 4.0–5.6)

## 2021-04-06 MED ORDER — OZEMPIC (0.25 OR 0.5 MG/DOSE) 2 MG/1.5ML ~~LOC~~ SOPN: 0.5000 mg | PEN_INJECTOR | SUBCUTANEOUS | 3 refills | Status: DC

## 2021-04-06 NOTE — Patient Instructions (Signed)
good diet and exercise significantly improve the control of your diabetes.  please let me know if you wish to be referred to a dietician.  high blood sugar is very risky to your health.  you should see an eye doctor and dentist every year.  It is very important to get all recommended vaccinations.  Controlling your blood pressure and cholesterol drastically reduces the damage diabetes does to your body.  Those who smoke should quit.  Please discuss these with your doctor.  I have sent a prescription to your pharmacy, to add "Ozempic."  The first 1-2 shots, take just 0.25 mg. Please continue the same Novolog.  Please continue to pursue the pump, as per Vaughan Basta. Please come back for a follow-up appointment in 2 months.

## 2021-04-06 NOTE — Progress Notes (Signed)
Subjective:    Patient ID: Sarah [name], female    DOB: Jan 02, 1961, 60 y.o.   MRN: [id number]  HPI pt is referred by Dr [name], for diabetes.  Pt states DM was dx'ed in [id number]; it is complicated by PN and PAD; she has been on insulin since 2012; pt says her diet and exercise are good; she has never had GDM, pancreatitis, pancreatic surgery, severe hypoglycemia or DKA.  She stopped Omnipod pumps a few days ago, due to skin irritation.  Since then, she takes Novolog PRN, total of approx 60 units/d.  I reviewed continuous glucose monitor data.  Glucose varies from 180-500.  It is in general highest at 11AM and 10PM.  It decreases MN-9AM.   Past Medical History:  Diagnosis Date   Anxiety    Diabetes mellitus    Diverticulosis    GERD (gastroesophageal reflux disease)    Hypercholesteremia    Hypertension     Past Surgical History:  Procedure Laterality Date   ABDOMINAL HYSTERECTOMY     complete   CARDIAC CATHETERIZATION  2011   COLONOSCOPY  01/04/2010   left sided transverse diverticula/two diminutive rectal polyps   COLONOSCOPY  03/28/2012   Procedure: COLONOSCOPY;  Surgeon: Daneil Dolin, MD;  [facility];  Service: Endoscopy;  Laterality: N/A;  9:30   COLONOSCOPY N/A 09/11/2018   Procedure: COLONOSCOPY;  Surgeon: Aviva Signs, MD;  Location[facility];  Service: Gastroenterology;  Laterality: N/A;   FOOT SURGERY     right-tendon repair   laparoscopic sigmoid colectomy  02/01/10   Dr Jola Babinski   TRIGGER FINGER RELEASE Left 03/2015   TRIGGER FINGER RELEASE Right 02/25/2016   Procedure: RIGHT LONG TRIGGER FINGER RELEASE;  Surgeon: Carole Civil, MD;  Location: [facility];  Service: Orthopedics;  Laterality: Right;   UMBILICAL HERNIA REPAIR N/A 02/05/2020   Procedure: UMBILICAL HERNIORRHAPY WITH MESH;  Surgeon: Aviva Signs, MD;  Location: [facility];  Service: General;  Laterality: N/A;    Social History   Socioeconomic History   Marital status: Married     Spouse name: Not on file   Number of children: 1   Years of education: Not on file   Highest education level: Not on file  Occupational History   Occupation: K Pharmacist, hospital asst    Employer: [employer]  Tobacco Use   Smoking status: Former    Packs/day: 1.00    Years: 10.00    Pack years: 10.00    Types: Cigarettes    Quit date: 09/05/2006    Years since quitting: 14.6   Smokeless tobacco: Never  Vaping Use   Vaping Use: Never used  Substance and Sexual Activity   Alcohol use: Yes    Comment: occasional   Drug use: No   Sexual activity: Yes    Birth control/protection: Surgical    Comment: hyst  Other Topics Concern   Not on file  Social History Narrative   Lives w/ husband, sister & her children , teen mom & her baby   Social Determinants of Health   Financial Resource Strain: Not on file  Food Insecurity: Not on file  Transportation Needs: Not on file  Physical Activity: Not on file  Stress: Not on file  Social Connections: Not on file  Intimate Partner Violence: Not on file    Current Outpatient Medications on File Prior to Visit  Medication Sig Dispense Refill   aspirin EC 81 MG tablet Take 81 mg by mouth  daily.     Continuous Blood Gluc Receiver ([place]) [place] by Does not apply route.     diclofenac Sodium (VOLTAREN) 1 % GEL Apply 2 g topically 4 (four) times daily. 2 g 5   dicyclomine (BENTYL) 20 MG tablet Take 1 tablet (20 mg total) by mouth 2 (two) times daily for 30 doses. 30 tablet 0   escitalopram (LEXAPRO) 5 MG tablet Take 5 mg by mouth at bedtime.      [START ON 04/11/2021] fluconazole (DIFLUCAN) 150 MG tablet Take 1 tablet (150 mg total) by mouth once for 1 dose. 1 tablet 0   ibuprofen (ADVIL) 800 MG tablet Take 800 mg by mouth every 6 (six) hours as needed for moderate pain.     LORazepam (ATIVAN) 0.5 MG tablet Take 0.5 mg by mouth 2 (two) times daily as needed for anxiety.     losartan (COZAAR) 25 MG tablet Take 25 mg by mouth daily.      Magnesium 400 MG TABS Take 400 mg by mouth daily.     metFORMIN (GLUCOPHAGE-XR) 500 MG 24 hr tablet Take 500 mg by mouth 2 (two) times daily.      methocarbamol (ROBAXIN) 500 MG tablet Take 1 tablet (500 mg total) by mouth 2 (two) times daily. 20 tablet 0   metoprolol tartrate (LOPRESSOR) 50 MG tablet Take 1 tablet (50 mg total) by mouth 2 (two) times daily. (Patient taking differently: Take 50 mg by mouth 2 (two) times daily. Still taking metoprolol) 180 tablet 3   Multiple Vitamins-Minerals (MULTIVITAMIN PO) Take 1 tablet by mouth daily.     simvastatin (ZOCOR) 20 MG tablet Take 20 mg by mouth every Monday, Wednesday, and Friday.      Tetrahydrozoline HCl (VISINE OP) Place 1 drop into both eyes daily as needed (dry eyes).     traMADol (ULTRAM) 50 MG tablet Take 1 tablet (50 mg total) by mouth every 6 (six) hours as needed. 15 tablet 0   No current facility-administered medications on file prior to visit.    Allergies  Allergen Reactions   Sulfonamide Derivatives Itching   Latex Itching, Rash and Other (See Comments)    Powdered only    Family History  Problem Relation Age of Onset   Colon cancer Maternal Grandfather    Colon polyps Mother    Diabetes Mother    Hypertension Mother    Heart attack Father    Heart murmur Sister    Hypertension Brother    Diabetes Son     BP 124/66 (BP Location: Right Arm, Patient Position: Sitting, Cuff Size: Normal)   Pulse 69   Ht '5\' 3"'$  (1.6 m)   Wt 194 lb 3.2 oz (88.1 kg)   SpO2 97%   BMI 34.40 kg/m     Review of Systems denies weight loss, sob, n/v, memory loss, and depression.      Objective:   Physical Exam Pulses: dorsalis pedis intact bilat.   MSK: no deformity of the feet CV: no leg edema Skin:  no ulcer on the feet.  normal color and temp on the feet. Neuro: sensation is intact to touch on the feet, but decreased from normal.    Lab Results  Component Value Date   HGBA1C 11.9 (A) 04/06/2021   Lab Results  Component  Value Date   CREATININE 0.83 04/04/2021   BUN 16 04/04/2021   NA 134 (L) 04/04/2021   K 4.4 04/04/2021   CL 100 04/04/2021   CO2  27 04/04/2021   I have reviewed outside records, and summarized: Pt was noted to have elevated A1c, and referred here.  She was seen in ER for abd pain.  Glucose was very high.  Pt attributed to [place] dysfunction.       Assessment & Plan:  Insulin-requiring type 2 DM: uncontrolled.    Patient Instructions  good diet and exercise significantly improve the control of your diabetes.  please let me know if you wish to be referred to a dietician.  high blood sugar is very risky to your health.  you should see an eye doctor and dentist every year.  It is very important to get all recommended vaccinations.  Controlling your blood pressure and cholesterol drastically reduces the damage diabetes does to your body.  Those who smoke should quit.  Please discuss these with your doctor.  I have sent a prescription to your pharmacy, to add "Ozempic."  The first 1-2 shots, take just 0.25 mg. Please continue the same Novolog.  Please continue to pursue the pump, as per Vaughan Basta. Please come back for a follow-up appointment in 2 months.

## 2021-04-22 ENCOUNTER — Telehealth: Payer: Self-pay | Admitting: Dietician

## 2021-05-04 ENCOUNTER — Other Ambulatory Visit: Payer: Self-pay

## 2021-05-04 ENCOUNTER — Encounter: Payer: BC Managed Care – PPO | Attending: Endocrinology | Admitting: Nutrition

## 2021-05-04 DIAGNOSIS — E1169 Type 2 diabetes mellitus with other specified complication: Secondary | ICD-10-CM | POA: Insufficient documentation

## 2021-05-04 DIAGNOSIS — E669 Obesity, unspecified: Secondary | ICD-10-CM | POA: Insufficient documentation

## 2021-05-05 ENCOUNTER — Telehealth: Payer: Self-pay | Admitting: Nutrition

## 2021-05-05 NOTE — Progress Notes (Signed)
Patient was trained on how to use the Tandem control IQ pump.  Settings were put in by patient:  Basal rate 0.6u/hr, ISF: 50, I/C: 20, target 120. Patient' s pump was linked to her phone, and to our practice.   She was shown how to bolus, which she re demonstrated correctly X 3, and how/when to do corrections boluses as well The sleep mode was put in at [facility]   She filled a cartridge with Novolog insulin and inserted a 16m infusion set into her upper left abdomen without difficulty.  Her dexcom was linked to her pump and she did a correction dose at 4:30 for a blood sugar reading of 396 without any assistance from me. We reviewed carb counting, because she was not using this most of the time, and she did not realize that fruits were also carbs.  Handout given for 15 gram carb portions and she was encouraged to download Calorie [employer] Paceapp to her phone to help with this, when eating out.  We also reviewed label readings and how to determine total carbs eaten.  We reviewed high blood sugar protocols, when and how to stop the pump, reviewed alerts and alarms and sick day guidelines.  Handouts given for all of the above topics and she was encouraged to read all of them.  She agreed to do this.   Due to the lateness of her leaving, she was not able to schedule a return visit with Dr. ELoanne Drillingfor the end of the week, but promised to call tomorrow to schedule this.  She will return to see me next week for review of all of the above topics.  She signed the pump checklist as understanding all topics and had no final questions.

## 2021-05-05 NOTE — Patient Instructions (Signed)
Read over handouts given and call tandem help line if questions. Schedule appointment with Dr. Loanne Drilling for Friday or next Monday Call office if blood sugars drop below 70 or remain over 200

## 2021-05-05 NOTE — Telephone Encounter (Signed)
Patient reported that she gave a bolus last night before supper, without difficulty, but blood sugar did not come down.  FBS today was 471.  She has been doing correction doses, but reading is still 450.  I had her pull the infusion set, and her canula was bent.  She was told to put another infusion set in, but did not have one.  She does have insulin and a syringe.  She was told to take 9u now.  She agreed to do this and to call her husband to bring her an infusion set. She was given my number to call me back,for directions on how to insert a new set.  She agreed to call me back in about an hour.  Denies nausea, or vomiting.   Pt. Called back at 2:30PM.  Talked her through making changes to her infusion set.  Blood sugar was 373 with correction dose by pump and 2u added for no basal insulin delivery.  Will call her tonight to see how she is doing.

## 2021-05-06 NOTE — Telephone Encounter (Signed)
Belenda Cruise will contact patient at 8am to get schedule for sooner appointment.

## 2021-05-06 NOTE — Telephone Encounter (Signed)
Patient has been moved to 05/11/2021 at 4:30 per Dr. Loanne Drilling request

## 2021-05-10 ENCOUNTER — Encounter: Payer: Self-pay | Admitting: Endocrinology

## 2021-05-11 ENCOUNTER — Other Ambulatory Visit: Payer: Self-pay

## 2021-05-11 ENCOUNTER — Ambulatory Visit (INDEPENDENT_AMBULATORY_CARE_PROVIDER_SITE_OTHER): Payer: BC Managed Care – PPO | Admitting: Endocrinology

## 2021-05-11 VITALS — BP 160/80 | HR 79 | Ht 63.0 in | Wt 192.4 lb

## 2021-05-11 DIAGNOSIS — Z794 Long term (current) use of insulin: Secondary | ICD-10-CM

## 2021-05-11 DIAGNOSIS — E119 Type 2 diabetes mellitus without complications: Secondary | ICD-10-CM

## 2021-05-11 LAB — POCT GLYCOSYLATED HEMOGLOBIN (HGB A1C): Hemoglobin A1C: 12.4 % — AB (ref 4.0–5.6)

## 2021-05-11 NOTE — Progress Notes (Signed)
Subjective:    Patient ID: Sarah [name], female    DOB: 01/07/1961, 60 y.o.   MRN: [id number]  HPI Pt returns for f/u of diabetes mellitus: DM type: Insulin-requiring type 2 Dx'ed: 6269 Complications: PN and PAD Therapy: insulin since 2012, and Ozempic GDM: never DKA: never Severe hypoglycemia: never Pancreatitis: never Pancreatic imaging: normal on 2022 CT SDOH: none Other: she used Omnipod pumps since 2022, and continuous glucose monitor Interval history: she takes these pump settings: basal rate of 0.6 units/hr (when not in control IQ) bolus of 1 unit/20 grams carbohydrate.  correction bolus (which some people call "sensitivity," or "insulin sensitivity ratio," or just "isr") of 1 unit for each 50 by which your glucose exceeds 100 TDD is 31 units (52% basal, 31% meal bolus).  She is in control IQ only 66% of the time.  Pt says she does not know why this is.  Pt says she averages approx 20 units per mealtime bolus, but pump says just a total of 9 units per day She started Ozempic 0.5 mg/week, yesterday.  I reviewed continuous glucose monitor data.  Glucose varies from 170-500.  It is in general lowest at [place].  It is in general highest at [facility] and 9PM.  it decreases overnight Past Medical History:  Diagnosis Date   Anxiety    Diabetes mellitus    Diverticulosis    GERD (gastroesophageal reflux disease)    Hypercholesteremia    Hypertension     Past Surgical History:  Procedure Laterality Date   ABDOMINAL HYSTERECTOMY     complete   CARDIAC CATHETERIZATION  2011   COLONOSCOPY  01/04/2010   left sided transverse diverticula/two diminutive rectal polyps   COLONOSCOPY  03/28/2012   Procedure: COLONOSCOPY;  Surgeon: Daneil Dolin, MD;  [facility];  Service: Endoscopy;  Laterality: N/A;  9:30   COLONOSCOPY N/A 09/11/2018   Procedure: COLONOSCOPY;  Surgeon: Aviva Signs, MD;  Location[facility];  Service: Gastroenterology;  Laterality: N/A;   FOOT SURGERY      right-tendon repair   laparoscopic sigmoid colectomy  02/01/10   Dr Jola Babinski   TRIGGER FINGER RELEASE Left 03/2015   TRIGGER FINGER RELEASE Right 02/25/2016   Procedure: RIGHT LONG TRIGGER FINGER RELEASE;  Surgeon: Carole Civil, MD;  Location: [facility];  Service: Orthopedics;  Laterality: Right;   UMBILICAL HERNIA REPAIR N/A 02/05/2020   Procedure: UMBILICAL HERNIORRHAPY WITH MESH;  Surgeon: Aviva Signs, MD;  Location: [facility];  Service: General;  Laterality: N/A;    Social History   Socioeconomic History   Marital status: Married    Spouse name: Not on file   Number of children: 1   Years of education: Not on file   Highest education level: Not on file  Occupational History   Occupation: K Pharmacist, hospital asst    Employer: [employer]  Tobacco Use   Smoking status: Former    Packs/day: 1.00    Years: 10.00    Pack years: 10.00    Types: Cigarettes    Quit date: 09/05/2006    Years since quitting: 14.7   Smokeless tobacco: Never  Vaping Use   Vaping Use: Never used  Substance and Sexual Activity   Alcohol use: Yes    Comment: occasional   Drug use: No   Sexual activity: Yes    Birth control/protection: Surgical    Comment: hyst  Other Topics Concern   Not on file  Social History Narrative  Lives w/ husband, sister & her children , teen mom & her baby   Social Determinants of Health   Financial Resource Strain: Not on file  Food Insecurity: Not on file  Transportation Needs: Not on file  Physical Activity: Not on file  Stress: Not on file  Social Connections: Not on file  Intimate Partner Violence: Not on file    Current Outpatient Medications on File Prior to Visit  Medication Sig Dispense Refill   aspirin EC 81 MG tablet Take 81 mg by mouth daily.     Continuous Blood Gluc Receiver ([facility]) DEVI by Does not apply route.     diclofenac Sodium (VOLTAREN) 1 % GEL Apply 2 g topically 4 (four) times daily. 2 g 5   escitalopram  (LEXAPRO) 5 MG tablet Take 5 mg by mouth at bedtime.      ibuprofen (ADVIL) 800 MG tablet Take 800 mg by mouth every 6 (six) hours as needed for moderate pain.     LORazepam (ATIVAN) 0.5 MG tablet Take 0.5 mg by mouth 2 (two) times daily as needed for anxiety.     losartan (COZAAR) 25 MG tablet Take 25 mg by mouth daily.     Magnesium 400 MG TABS Take 400 mg by mouth daily.     metFORMIN (GLUCOPHAGE-XR) 500 MG 24 hr tablet Take 500 mg by mouth 2 (two) times daily.      methocarbamol (ROBAXIN) 500 MG tablet Take 1 tablet (500 mg total) by mouth 2 (two) times daily. 20 tablet 0   metoprolol tartrate (LOPRESSOR) 50 MG tablet Take 1 tablet (50 mg total) by mouth 2 (two) times daily. (Patient taking differently: Take 50 mg by mouth 2 (two) times daily. Still taking metoprolol) 180 tablet 3   Multiple Vitamins-Minerals (MULTIVITAMIN PO) Take 1 tablet by mouth daily.     Semaglutide,0.25 or 0.5MG /DOS, (OZEMPIC, 0.25 OR 0.5 MG/DOSE,) 2 MG/1.5ML SOPN Inject 0.5 mg into the skin once a week. 4.5 mL 3   simvastatin (ZOCOR) 20 MG tablet Take 20 mg by mouth every Monday, Wednesday, and Friday.      Tetrahydrozoline HCl (VISINE OP) Place 1 drop into both eyes daily as needed (dry eyes).     traMADol (ULTRAM) 50 MG tablet Take 1 tablet (50 mg total) by mouth every 6 (six) hours as needed. 15 tablet 0   dicyclomine (BENTYL) 20 MG tablet Take 1 tablet (20 mg total) by mouth 2 (two) times daily for 30 doses. 30 tablet 0   No current facility-administered medications on file prior to visit.    Allergies  Allergen Reactions   Sulfonamide Derivatives Itching   Latex Itching, Rash and Other (See Comments)    Powdered only    Family History  Problem Relation Age of Onset   Colon cancer Maternal Grandfather    Colon polyps Mother    Diabetes Mother    Hypertension Mother    Heart attack Father    Heart murmur Sister    Hypertension Brother    Diabetes Son     BP (!) 160/80 (BP Location: Right Arm,  Patient Position: Sitting, Cuff Size: Normal)   Pulse 79   Ht 5\' 3"  (1.6 m)   Wt 192 lb 6.4 oz (87.3 kg)   SpO2 98%   BMI 34.08 kg/m   Review of Systems Denies N/V/HB    Objective:   Physical Exam Pulses: dorsalis pedis intact bilat.   MSK: no deformity of the feet CV: no leg  edema Skin:  no ulcer on the feet.  normal color and temp on the feet. Neuro: sensation is intact to touch on the feet  Lab Results  Component Value Date   HGBA1C 12.4 (A) 05/11/2021      Assessment & Plan:  Insulin-requiring type 2 DM: severe exacerbation.  she needs careful monitoring of glucose for hypoglycemia.    Patient Instructions  Please take these settings: basal rate of 0.6 units/hr (when not in control IQ) bolus of 1 unit/10 grams carbohydrate.  correction bolus (which some people call "sensitivity," or "insulin sensitivity ratio," or just "isr") of 1 unit for each 50 by which your glucose exceeds 100. Please see or talk to [facility], to reconcile the amount of bolus you are taking with what the pump download is saying.  Also, why you are in control-IQ just 66% of the time.   Please continue the same Ozempic.   Please come back for a follow-up appointment in 1 month.

## 2021-05-11 NOTE — Patient Instructions (Addendum)
Please take these settings: basal rate of 0.6 units/hr (when not in control IQ) bolus of 1 unit/10 grams carbohydrate.  correction bolus (which some people call "sensitivity," or "insulin sensitivity ratio," or just "isr") of 1 unit for each 50 by which your glucose exceeds 100. Please see or talk to [facility], to reconcile the amount of bolus you are taking with what the pump download is saying.  Also, why you are in control-IQ just 66% of the time.   Please continue the same Ozempic.   Please come back for a follow-up appointment in 1 month.

## 2021-05-18 ENCOUNTER — Encounter: Payer: BC Managed Care – PPO | Admitting: Nutrition

## 2021-05-18 ENCOUNTER — Other Ambulatory Visit: Payer: Self-pay

## 2021-05-18 ENCOUNTER — Encounter: Payer: Self-pay | Admitting: Internal Medicine

## 2021-05-18 DIAGNOSIS — E1169 Type 2 diabetes mellitus with other specified complication: Secondary | ICD-10-CM

## 2021-05-19 NOTE — Patient Instructions (Signed)
When changing sensor, always put information into pump, NOT the Dexcom app Call if blood sugars remain high.

## 2021-05-19 NOTE — Progress Notes (Signed)
Patient is here today to do a final training on tandem pump usage.  She reports that her Dexcom is not linking to her pump and therefore blood sugars are running very high because blood sugar readings are  not connecting to pump.  Download shows readings in the upper 200s to 300s.   She turned off all alarms because they were waking her and her husband up all night.   Patient did not put new transmitter ID into pump.  New number put into pump as well as code number of new sensor entered, and pump connected to sensor readings.  Low alarm turned back on and Control IQ was turned back on, as well as sleep mode. Reviewed all topics of pump usage and she reported good understanding of all topics.  Special review of need to put new infor into pump, not Dexcom app when starting new sensor.  She reported good understanding of need to do this. And had no final questions.

## 2021-06-08 ENCOUNTER — Ambulatory Visit: Payer: BC Managed Care – PPO | Admitting: Endocrinology

## 2021-06-14 ENCOUNTER — Encounter: Payer: Self-pay | Admitting: Endocrinology

## 2021-06-15 ENCOUNTER — Ambulatory Visit (INDEPENDENT_AMBULATORY_CARE_PROVIDER_SITE_OTHER): Payer: BC Managed Care – PPO | Admitting: Endocrinology

## 2021-06-15 ENCOUNTER — Other Ambulatory Visit: Payer: Self-pay

## 2021-06-15 VITALS — BP 130/62 | HR 94 | Ht 63.0 in | Wt 194.4 lb

## 2021-06-15 DIAGNOSIS — Z794 Long term (current) use of insulin: Secondary | ICD-10-CM

## 2021-06-15 DIAGNOSIS — E119 Type 2 diabetes mellitus without complications: Secondary | ICD-10-CM | POA: Diagnosis not present

## 2021-06-15 MED ORDER — SEMAGLUTIDE (1 MG/DOSE) 4 MG/3ML ~~LOC~~ SOPN: 1.0000 mg | PEN_INJECTOR | SUBCUTANEOUS | 3 refills | Status: DC

## 2021-06-15 NOTE — Patient Instructions (Addendum)
Please take these settings: basal rate of 0.6 units/hr (when not in control IQ) bolus of 1 unit/10 grams carbohydrate.  correction bolus (which some people call "sensitivity," or "insulin sensitivity ratio," or just "isr") of 1 unit for each 50 by which your glucose exceeds 100. Please see or talk to [facility] today, to see why your pump is not staying in control IQ I have sent a prescription to your pharmacy, to increase the Ozempic.   Please come back for a follow-up appointment in 1 month.

## 2021-06-15 NOTE — Progress Notes (Signed)
Subjective:    Patient ID: Sarah [name], female    DOB: 04-Jun-1961, 60 y.o.   MRN: [id number]  HPI Pt returns for f/u of diabetes mellitus: DM type: Insulin-requiring type 2 Dx'ed: 0947 Complications: PN and PAD Therapy: insulin since 2012, and Ozempic GDM: never DKA: never Severe hypoglycemia: never Pancreatitis: never Pancreatic imaging: normal on 2022 CT SDOH: none Other: she used Omnipod pumps since 2022, and continuous glucose monitor Interval history: she takes these pump settings: basal rate of 0.6 units/hr (when not in control IQ) bolus of 1 unit/10 grams carbohydrate.  correction bolus (which some people call "sensitivity," or "insulin sensitivity ratio," or just "isr") of 1 unit for each 50 by which your glucose exceeds 100.   TDD is 104 units (37% basal, 62% meal bolus).  She is in control IQ only 44% of the time.  Pt says she does not know why this is.  She started Ozempic 0.5 mg/week, yesterday.  I reviewed continuous glucose monitor data.  Glucose varies from 90-500.  It is in general lowest at [place].  It is in general highest at [facility] and less high at 8PM-2AM.  it decreases overnight.  She takes 0-3 boluses per day.   Past Medical History:  Diagnosis Date   Anxiety    Diabetes mellitus    Diverticulosis    GERD (gastroesophageal reflux disease)    Hypercholesteremia    Hypertension     Past Surgical History:  Procedure Laterality Date   ABDOMINAL HYSTERECTOMY     complete   CARDIAC CATHETERIZATION  2011   COLONOSCOPY  01/04/2010   left sided transverse diverticula/two diminutive rectal polyps   COLONOSCOPY  03/28/2012   Procedure: COLONOSCOPY;  Surgeon: Daneil Dolin, MD;  [facility];  Service: Endoscopy;  Laterality: N/A;  9:30   COLONOSCOPY N/A 09/11/2018   Procedure: COLONOSCOPY;  Surgeon: Aviva Signs, MD;  [facility];  Service: Gastroenterology;  Laterality: N/A;   FOOT SURGERY     right-tendon repair   laparoscopic sigmoid  colectomy  02/01/10   Dr Jola Babinski   TRIGGER FINGER RELEASE Left 03/2015   TRIGGER FINGER RELEASE Right 02/25/2016   Procedure: RIGHT LONG TRIGGER FINGER RELEASE;  Surgeon: Carole Civil, MD;  Location: [facility];  Service: Orthopedics;  Laterality: Right;   UMBILICAL HERNIA REPAIR N/A 02/05/2020   Procedure: UMBILICAL HERNIORRHAPY WITH MESH;  Surgeon: Aviva Signs, MD;  Location: [facility];  Service: General;  Laterality: N/A;    Social History   Socioeconomic History   Marital status: Married    Spouse name: Not on file   Number of children: 1   Years of education: Not on file   Highest education level: Not on file  Occupational History   Occupation: K Pharmacist, hospital asst    Employer: [employer]  Tobacco Use   Smoking status: Former    Packs/day: 1.00    Years: 10.00    Pack years: 10.00    Types: Cigarettes    Quit date: 09/05/2006    Years since quitting: 14.7   Smokeless tobacco: Never  Vaping Use   Vaping Use: Never used  Substance and Sexual Activity   Alcohol use: Yes    Comment: occasional   Drug use: No   Sexual activity: Yes    Birth control/protection: Surgical    Comment: hyst  Other Topics Concern   Not on file  Social History Narrative   Lives w/ husband, sister & her children ,  teen mom & her baby   Social Determinants of Radio broadcast assistant Strain: Not on file  Food Insecurity: Not on file  Transportation Needs: Not on file  Physical Activity: Not on file  Stress: Not on file  Social Connections: Not on file  Intimate Partner Violence: Not on file    Current Outpatient Medications on File Prior to Visit  Medication Sig Dispense Refill   aspirin EC 81 MG tablet Take 81 mg by mouth daily.     Continuous Blood Gluc Receiver ([employer]) DEVI by Does not apply route.     diclofenac Sodium (VOLTAREN) 1 % GEL Apply 2 g topically 4 (four) times daily. 2 g 5   escitalopram (LEXAPRO) 5 MG tablet Take 5 mg by mouth at bedtime.       ibuprofen (ADVIL) 800 MG tablet Take 800 mg by mouth every 6 (six) hours as needed for moderate pain.     LORazepam (ATIVAN) 0.5 MG tablet Take 0.5 mg by mouth 2 (two) times daily as needed for anxiety.     losartan (COZAAR) 25 MG tablet Take 25 mg by mouth daily.     Magnesium 400 MG TABS Take 400 mg by mouth daily.     metFORMIN (GLUCOPHAGE-XR) 500 MG 24 hr tablet Take 500 mg by mouth 2 (two) times daily.      methocarbamol (ROBAXIN) 500 MG tablet Take 1 tablet (500 mg total) by mouth 2 (two) times daily. 20 tablet 0   metoprolol tartrate (LOPRESSOR) 50 MG tablet Take 1 tablet (50 mg total) by mouth 2 (two) times daily. (Patient taking differently: Take 50 mg by mouth 2 (two) times daily. Still taking metoprolol) 180 tablet 3   Multiple Vitamins-Minerals (MULTIVITAMIN PO) Take 1 tablet by mouth daily.     simvastatin (ZOCOR) 20 MG tablet Take 20 mg by mouth every Monday, Wednesday, and Friday.      Tetrahydrozoline HCl (VISINE OP) Place 1 drop into both eyes daily as needed (dry eyes).     traMADol (ULTRAM) 50 MG tablet Take 1 tablet (50 mg total) by mouth every 6 (six) hours as needed. 15 tablet 0   dicyclomine (BENTYL) 20 MG tablet Take 1 tablet (20 mg total) by mouth 2 (two) times daily for 30 doses. 30 tablet 0   No current facility-administered medications on file prior to visit.    Allergies  Allergen Reactions   Sulfonamide Derivatives Itching   Latex Itching, Rash and Other (See Comments)    Powdered only    Family History  Problem Relation Age of Onset   Colon cancer Maternal Grandfather    Colon polyps Mother    Diabetes Mother    Hypertension Mother    Heart attack Father    Heart murmur Sister    Hypertension Brother    Diabetes Son     BP 130/62 (BP Location: Right Arm, Patient Position: Sitting, Cuff Size: Normal)   Pulse 94   Ht 5\' 3"  (1.6 m)   Wt 194 lb 6.4 oz (88.2 kg)   SpO2 98%   BMI 34.44 kg/m   Review of Systems Denies N/V/HB    Objective:    Physical Exam       Assessment & Plan:  Insulin-requiring type 2 DM: uncontrolled  Patient Instructions  Please take these settings: basal rate of 0.6 units/hr (when not in control IQ) bolus of 1 unit/10 grams carbohydrate.  correction bolus (which some people call "sensitivity," or "insulin sensitivity  ratio," or just "isr") of 1 unit for each 50 by which your glucose exceeds 100. Please see or talk to [facility] today, to see why your pump is not staying in control IQ I have sent a prescription to your pharmacy, to increase the Ozempic.   Please come back for a follow-up appointment in 1 month.

## 2021-06-16 ENCOUNTER — Telehealth: Payer: Self-pay | Admitting: Nutrition

## 2021-06-16 NOTE — Telephone Encounter (Signed)
Discussed CGM download with Tandem rep.  Several things were noted:  Not wearing CGM, less than 1/2 the time.  Discussed this with patient and she says she was running short of money at the end of the month and could not afford this.  Told her to call me if this happens, and I will give sample if possible Bolusing 500grams of carb only once a day. Pt. Was told to give boluses for all snacks and meals during the day, and not wait until once a day to try to get the blood sugar readings down.  Told her that when this happens, the basal insulin stops, making it harder to control the blood sugar readings. Bolusing after the meals, when blood sugars have already started to rise.  Todl her that this requires so much more insulin to get it down, causing more weight gain and higher blood sugars  Discussed the importance of taking the bolus before the meal starts--15 minutes if possible.  She agreed to do this. Snacking on high fat foods, IE ice cream  with nuts and syrup.  Told to add extra insulin for chips, ice cream, chocolates due to high fat content.  3-5u more.  She agreed to do this.  Start on ozempic to control food intake. Told to call me back in 1 week to see if readngs are coming down.

## 2021-06-17 ENCOUNTER — Ambulatory Visit: Payer: BC Managed Care – PPO | Admitting: Endocrinology

## 2021-06-21 ENCOUNTER — Telehealth: Payer: Self-pay | Admitting: Nutrition

## 2021-06-21 ENCOUNTER — Encounter: Payer: Self-pay | Admitting: Endocrinology

## 2021-06-21 NOTE — Telephone Encounter (Signed)
Patient says blood sugars are running high, despite giving correction doses.  Told her to pull out the infusion set to see if the canula is bent.  She does not have an extra infusion set at work.  Will do this when she gets home tonight. Appointment made for her to see me tomorrow to download pump and discuss infusion sets and sites used, as well as to see how long/high blood sugars are going, and if there is any pattern for this.

## 2021-06-22 ENCOUNTER — Other Ambulatory Visit: Payer: Self-pay

## 2021-06-22 ENCOUNTER — Encounter: Payer: BC Managed Care – PPO | Attending: Endocrinology | Admitting: Nutrition

## 2021-06-22 DIAGNOSIS — E1165 Type 2 diabetes mellitus with hyperglycemia: Secondary | ICD-10-CM | POA: Diagnosis present

## 2021-06-22 DIAGNOSIS — E119 Type 2 diabetes mellitus without complications: Secondary | ICD-10-CM

## 2021-06-22 MED ORDER — INSULIN ASPART 100 UNIT/ML IJ SOLN: INTRAMUSCULAR | 3 refills | Status: DC

## 2021-06-23 NOTE — Progress Notes (Signed)
Pump download shows patient still not bolusing for meals, and taking 25, or 30u when high Pt. Eating only 1-2 meals per day. Stressed again the need to bolus 15 min. Before eating meals!!!!, so that the pump can use less insulin and be better able to bring down high blood sugars.  Pt. Is complaining of weight gain, and discussed that this is directly related to how much insulin she is needing.  Dr Loanne Drilling not here today, so download shown to Dr. Cruzita Lederer. Settings were changed per her orders, due to only 25% of TDD dose of insulin is given as basal insulin.  Basal rate: 0.9, I/C: 8, ISF: 25,  She was told to increase basal rate in 2-3 days to 1.2, if readings do not come down.  Stessed need again to bolus for all meals, 13-30 minutes before eating, depending on premeal blood sugar readings.  She agreed to do this and had no final questions. She was told to call call the office if readings do not come down, and to call me next week to let me know how she is doing.  She agreed to do this, and had no final questions.

## 2021-06-23 NOTE — Patient Instructions (Signed)
Take boluses 15 TO 30 MINUTES BEFORE MEALS! Call office if blood sugars do not come down by Friday Call me next week to let me know status of blood sugar readings.

## 2021-06-29 NOTE — Telephone Encounter (Signed)
Patient says is running out of cartridges and insulin.  She is changing her cartridge every 2 days and is using approx. 150/day.  Please order more insulin and in the vials (Novolog).  Thank you

## 2021-07-01 ENCOUNTER — Other Ambulatory Visit: Payer: Self-pay | Admitting: Endocrinology

## 2021-07-07 ENCOUNTER — Other Ambulatory Visit: Payer: Self-pay | Admitting: Endocrinology

## 2021-07-07 MED ORDER — T: SLIM X2 INS PMP/CONTROL 7.4 DEVI: 1.0000 | 3 refills | Status: DC

## 2021-07-07 NOTE — Telephone Encounter (Signed)
Message sent thru MyChart 

## 2021-08-05 ENCOUNTER — Encounter: Payer: Self-pay | Admitting: Gastroenterology

## 2021-10-04 ENCOUNTER — Ambulatory Visit: Payer: BC Managed Care – PPO | Admitting: Gastroenterology

## 2021-10-23 ENCOUNTER — Encounter (HOSPITAL_COMMUNITY): Payer: Self-pay

## 2021-10-23 ENCOUNTER — Other Ambulatory Visit: Payer: Self-pay

## 2021-10-23 ENCOUNTER — Emergency Department (HOSPITAL_COMMUNITY): Payer: BC Managed Care – PPO

## 2021-10-23 ENCOUNTER — Emergency Department (HOSPITAL_COMMUNITY)
Admission: EM | Admit: 2021-10-23 | Discharge: 2021-10-24 | Disposition: A | Discharge status: Home / Self Care | Payer: BC Managed Care – PPO | Attending: Emergency Medicine | Admitting: Emergency Medicine

## 2021-10-23 DIAGNOSIS — Z20822 Contact with and (suspected) exposure to covid-19: Secondary | ICD-10-CM | POA: Diagnosis not present

## 2021-10-23 DIAGNOSIS — R000 Tachycardia, unspecified: Secondary | ICD-10-CM | POA: Diagnosis not present

## 2021-10-23 DIAGNOSIS — K5790 Diverticulosis of intestine, part unspecified, without perforation or abscess without bleeding: Secondary | ICD-10-CM | POA: Diagnosis not present

## 2021-10-23 DIAGNOSIS — R1032 Left lower quadrant pain: Secondary | ICD-10-CM | POA: Diagnosis present

## 2021-10-23 DIAGNOSIS — R630 Anorexia: Secondary | ICD-10-CM | POA: Diagnosis not present

## 2021-10-23 DIAGNOSIS — Z91040 Latex allergy status: Secondary | ICD-10-CM | POA: Diagnosis not present

## 2021-10-23 DIAGNOSIS — R7401 Elevation of levels of liver transaminase levels: Secondary | ICD-10-CM | POA: Diagnosis not present

## 2021-10-23 DIAGNOSIS — R350 Frequency of micturition: Secondary | ICD-10-CM | POA: Insufficient documentation

## 2021-10-23 DIAGNOSIS — Z794 Long term (current) use of insulin: Secondary | ICD-10-CM | POA: Diagnosis not present

## 2021-10-23 DIAGNOSIS — Z7982 Long term (current) use of aspirin: Secondary | ICD-10-CM | POA: Insufficient documentation

## 2021-10-23 DIAGNOSIS — E119 Type 2 diabetes mellitus without complications: Secondary | ICD-10-CM | POA: Insufficient documentation

## 2021-10-23 DIAGNOSIS — I10 Essential (primary) hypertension: Secondary | ICD-10-CM | POA: Diagnosis not present

## 2021-10-23 DIAGNOSIS — R109 Unspecified abdominal pain: Secondary | ICD-10-CM

## 2021-10-23 DIAGNOSIS — Z7984 Long term (current) use of oral hypoglycemic drugs: Secondary | ICD-10-CM | POA: Insufficient documentation

## 2021-10-23 DIAGNOSIS — Z79899 Other long term (current) drug therapy: Secondary | ICD-10-CM | POA: Diagnosis not present

## 2021-10-23 LAB — BASIC METABOLIC PANEL
Anion gap: 7 (ref 5–15)
BUN: 23 mg/dL (ref 8–23)
CO2: 26 mmol/L (ref 22–32)
Calcium: 8.7 mg/dL — ABNORMAL LOW (ref 8.9–10.3)
Chloride: 102 mmol/L (ref 98–111)
Creatinine, Ser: 0.89 mg/dL (ref 0.44–1.00)
GFR, Estimated: >60 mL/min (ref >60)
Glucose, Bld: 296 mg/dL — ABNORMAL HIGH (ref 70–99)
Potassium: 3.9 mmol/L (ref 3.5–5.1)
Sodium: 135 mmol/L (ref 135–145)

## 2021-10-23 LAB — URINALYSIS, ROUTINE W REFLEX MICROSCOPIC
Bilirubin Urine: NEGATIVE
Glucose, UA: 50 mg/dL — AB
Hgb urine dipstick: NEGATIVE
Ketones, ur: 5 mg/dL — AB
Leukocytes,Ua: NEGATIVE
Nitrite: NEGATIVE
Protein, ur: 30 mg/dL — AB
Specific Gravity, Urine: 1.03 (ref 1.005–1.030)
pH: 5 (ref 5.0–8.0)

## 2021-10-23 LAB — CBC
HCT: 40.4 % (ref 36.0–46.0)
Hemoglobin: 13.9 g/dL (ref 12.0–15.0)
MCH: 27.9 pg (ref 26.0–34.0)
MCHC: 34.4 g/dL (ref 30.0–36.0)
MCV: 81 fL (ref 80.0–100.0)
Platelets: 294 10*3/uL (ref 150–400)
RBC: 4.99 MIL/uL (ref 3.87–5.11)
RDW: 14.5 % (ref 11.5–15.5)
WBC: 9 10*3/uL (ref 4.0–10.5)
nRBC: 0 % (ref 0.0–0.2)

## 2021-10-23 MED ORDER — IOHEXOL 300 MG/ML  SOLN
100.0000 mL | Freq: Once | INTRAMUSCULAR | Status: AC | PRN
  Administered 2021-10-23: 100 mL via INTRAVENOUS

## 2021-10-23 NOTE — ED Triage Notes (Signed)
Left flank and burning with urination. Says that she goes frequently as well.  ?Says that it started yesterday.  ?

## 2021-10-23 NOTE — ED Notes (Signed)
Pt was wheeled to restroom at this time pt ambulated by herself into bathroom without incident. [name]

## 2021-10-24 ENCOUNTER — Encounter (HOSPITAL_COMMUNITY): Payer: Self-pay | Admitting: Emergency Medicine

## 2021-10-24 LAB — RESP PANEL BY RT-PCR (FLU A&B, COVID) ARPGX2
Influenza A by PCR: NEGATIVE
Influenza B by PCR: NEGATIVE
SARS Coronavirus 2 by RT PCR: NEGATIVE

## 2021-10-24 LAB — HEPATIC FUNCTION PANEL
ALT: 61 U/L — ABNORMAL HIGH (ref 0–44)
AST: 63 U/L — ABNORMAL HIGH (ref 15–41)
Albumin: 3.4 g/dL — ABNORMAL LOW (ref 3.5–5.0)
Alkaline Phosphatase: 78 U/L (ref 38–126)
Bilirubin, Direct: <0.1 mg/dL (ref 0.0–0.2)
Total Bilirubin: 0.3 mg/dL (ref 0.3–1.2)
Total Protein: 7.8 g/dL (ref 6.5–8.1)

## 2021-10-24 LAB — LIPASE, BLOOD: Lipase: 70 U/L — ABNORMAL HIGH (ref 11–51)

## 2021-10-24 MED ORDER — SODIUM CHLORIDE 0.9 % IV BOLUS
1000.0000 mL | Freq: Once | INTRAVENOUS | Status: AC
  Administered 2021-10-24: 1000 mL via INTRAVENOUS

## 2021-10-24 MED ORDER — OXYCODONE HCL 5 MG PO TABS
5.0000 mg | ORAL_TABLET | ORAL | 0 refills | Status: DC | PRN
Start: 2021-10-24 — End: 2021-12-10

## 2021-10-24 MED ORDER — SUCRALFATE 1 G PO TABS: 1.0000 g | ORAL_TABLET | Freq: Three times a day (TID) | ORAL | 0 refills | Status: DC

## 2021-10-24 MED ORDER — ONDANSETRON HCL 4 MG/2ML IJ SOLN
4.0000 mg | Freq: Once | INTRAMUSCULAR | Status: AC
  Administered 2021-10-24: 4 mg via INTRAVENOUS
  Filled 2021-10-24: qty 2

## 2021-10-24 MED ORDER — HYDROCODONE-ACETAMINOPHEN 5-325 MG PO TABS
1.0000 | ORAL_TABLET | Freq: Once | ORAL | Status: AC
  Administered 2021-10-24: 1 via ORAL
  Filled 2021-10-24: qty 1

## 2021-10-24 MED ORDER — KETOROLAC TROMETHAMINE 15 MG/ML IJ SOLN
15.0000 mg | Freq: Once | INTRAMUSCULAR | Status: AC
  Administered 2021-10-24: 15 mg via INTRAVENOUS
  Filled 2021-10-24: qty 1

## 2021-10-24 MED ORDER — DICYCLOMINE HCL 20 MG PO TABS: 20.0000 mg | ORAL_TABLET | Freq: Two times a day (BID) | ORAL | 0 refills | Status: DC

## 2021-10-24 MED ORDER — MORPHINE SULFATE (PF) 4 MG/ML IV SOLN
4.0000 mg | Freq: Once | INTRAVENOUS | Status: AC
Start: 2021-10-24 — End: 2021-10-24
  Administered 2021-10-24: 4 mg via INTRAVENOUS
  Filled 2021-10-24: qty 1

## 2021-10-24 NOTE — Discharge Instructions (Signed)

## 2021-10-24 NOTE — ED Provider Notes (Signed)
[facility] EMERGENCY DEPARTMENT Provider Note   CSN: [id number] Arrival date & time: 10/23/21  2032     History  Chief Complaint  Patient presents with   Flank Pain    Sarah [name] is a 61 y.o. female.  This is a 61 y.o. female with significant medical history as below, including anxiety, diabetes mellitus, diverticulosis who presents to the ED with complaint of left lower quadrant abdominal pain.  Location: Left lower quadrant, left flank Duration: 1 day Onset: Sudden Timing: Intermittent Description: Sharp, stabbing, aching Severity: Mild Exacerbating/Alleviating Factors: Worse when she picks up an object, bends over Associated Symptoms: Mild nausea without vomiting, reduced appetite, increased urinary frequency without dysuria or hematuria Pertinent Negatives: No fevers, chills, chest pain, dyspnea, change to bowel function, saddle paresthesias, lower extremity swelling or tingling or numbness.  No recent medication or diet changes.  No trauma.      Past Medical History: No date: Anxiety No date: Diabetes mellitus No date: Diverticulosis No date: GERD (gastroesophageal reflux disease) No date: Hypercholesteremia No date: Hypertension  Past Surgical History: No date: ABDOMINAL HYSTERECTOMY     Comment:  complete 2011: CARDIAC CATHETERIZATION 01/04/2010: COLONOSCOPY     Comment:  left sided transverse diverticula/two diminutive rectal               polyps 03/28/2012: COLONOSCOPY     Comment:  Procedure: COLONOSCOPY;  Surgeon: Daneil Dolin, MD;                Location: [facility] SU[facility];  Service: Endoscopy;                Laterality: N/A;  9:30 09/11/2018: COLONOSCOPY; N/A     Comment:  Procedure: COLONOSCOPY;  Surgeon: Aviva Signs, MD;                Location: [facility] SUITE;  Service: Gastroenterology;                Laterality: N/A; No date: FOOT SURGERY     Comment:  right-tendon repair 02/01/10: laparoscopic sigmoid colectomy     Comment:  Dr  Jola Babinski 03/2015: TRIGGER FINGER RELEASE; Left 02/25/2016: TRIGGER FINGER RELEASE; Right     Comment:  Procedure: RIGHT LONG TRIGGER FINGER RELEASE;  Surgeon:               Carole Civil, MD;  Location: [facility] ORS;  Service:               Orthopedics;  Laterality: Right; 5/59/7416: UMBILICAL HERNIA REPAIR; N/A     Comment:  Procedure: UMBILICAL HERNIORRHAPY WITH MESH;  Surgeon:               Aviva Signs, MD;  Location: [facility] ORS;  Service: General;               Laterality: N/A;    The history is provided by the patient. No language interpreter was used.  Flank Pain Associated symptoms include abdominal pain. Pertinent negatives include no chest pain, no headaches and no shortness of breath.      Home Medications Prior to Admission medications   Medication Sig Start Date End Date Taking? Authorizing Provider  dicyclomine (BENTYL) 20 MG tablet Take 1 tablet (20 mg total) by mouth 2 (two) times daily. 10/24/21  Yes Wynona Dove A, DO  oxyCODONE (ROXICODONE) 5 MG immediate release tablet Take 1 tablet (5 mg total) by mouth every 4 (four) hours as needed for severe pain. 10/24/21  Yes Wynona Dove A, DO  sucralfate (CARAFATE) 1 g tablet Take 1 tablet (1 g total) by mouth 4 (four) times daily -  with meals and at bedtime for 7 days. 10/24/21 10/31/21 Yes Jeanell Sparrow, DO  aspirin EC 81 MG tablet Take 81 mg by mouth daily.    [provider]  Continuous Blood Gluc Receiver (Helen) [place] by Does not apply route.    [provider]  diclofenac Sodium (VOLTAREN) 1 % GEL Apply 2 g topically 4 (four) times daily. 01/27/21   Carole Civil, MD  dicyclomine (BENTYL) 20 MG tablet Take 1 tablet (20 mg total) by mouth 2 (two) times daily for 30 doses. 04/04/21 04/19/21  Redwine, Madison A, PA-C  escitalopram (LEXAPRO) 5 MG tablet Take 5 mg by mouth at bedtime.  07/22/19   [provider]  ibuprofen (ADVIL) 800 MG tablet Take 800 mg by mouth every 6 (six)  hours as needed for moderate pain.    [provider]  insulin aspart (NOVOLOG) 100 UNIT/ML injection 110u per day via insulin pump 06/22/21   Renato Shin, MD  Insulin Infusion Pump (T: SLIM X2 INS PMP/CONTROL 7.4) DEVI 1 Device by Does not apply route every other day. This prescription is for T:slimX2 56m cartridges. 07/07/21   ERenato Shin MD  LORazepam (ATIVAN) 0.5 MG tablet Take 0.5 mg by mouth 2 (two) times daily as needed for anxiety. 11/28/19   [provider]  losartan (COZAAR) 25 MG tablet Take 25 mg by mouth daily.    [provider]  Magnesium 400 MG TABS Take 400 mg by mouth daily.    [provider]  metFORMIN (GLUCOPHAGE-XR) 500 MG 24 hr tablet Take 500 mg by mouth 2 (two) times daily.  09/06/16   [provider]  methocarbamol (ROBAXIN) 500 MG tablet Take 1 tablet (500 mg total) by mouth 2 (two) times daily. 06/16/20   VAlroy Bailiff Margaux, PA-C  metoprolol tartrate (LOPRESSOR) 50 MG tablet Take 1 tablet (50 mg total) by mouth 2 (two) times daily. Patient taking differently: Take 50 mg by mouth 2 (two) times daily. Still taking metoprolol 07/26/19 06/16/21  KHerminio Commons MD  Multiple Vitamins-Minerals (MULTIVITAMIN PO) Take 1 tablet by mouth daily.    [provider]  Semaglutide, 1 MG/DOSE, 4 MG/3ML SOPN Inject 1 mg as directed once a week. 06/15/21   ERenato Shin MD  simvastatin (ZOCOR) 20 MG tablet Take 20 mg by mouth every Monday, Wednesday, and Friday.  10/19/19   [provider]  Tetrahydrozoline HCl (VISINE OP) Place 1 drop into both eyes daily as needed (dry eyes).    [provider]  traMADol (ULTRAM) 50 MG tablet Take 1 tablet (50 mg total) by mouth every 6 (six) hours as needed. 08/08/20   IEvalee Jefferson PA-C      Allergies    Sulfonamide derivatives and Latex    Review of Systems   Review of Systems  Constitutional:  Negative for chills and fever.  HENT:  Negative for facial swelling and trouble  swallowing.   Eyes:  Negative for photophobia and visual disturbance.  Respiratory:  Negative for cough and shortness of breath.   Cardiovascular:  Negative for chest pain and palpitations.  Gastrointestinal:  Positive for abdominal pain and nausea. Negative for vomiting.  Endocrine: Negative for polydipsia and polyuria.  Genitourinary:  Positive for flank pain and frequency. Negative for difficulty urinating and hematuria.  Musculoskeletal:  Negative for gait problem  and joint swelling.  Skin:  Negative for pallor and rash.  Neurological:  Negative for syncope and headaches.  Psychiatric/Behavioral:  Negative for agitation and confusion.    Physical Exam Updated Vital Signs BP (!) 146/71    Pulse (!) 102    Temp 98.2 F (36.8 C)    Resp 18    Ht '5\' 3"'$  (1.6 m)    Wt 85.7 kg    SpO2 96%    BMI 33.48 kg/m  Physical Exam Vitals and nursing note reviewed.  Constitutional:      General: She is not in acute distress.    Appearance: Normal appearance. She is obese. She is not ill-appearing, toxic-appearing or diaphoretic.  HENT:     Head: Normocephalic and atraumatic.     Right Ear: External ear normal.     Left Ear: External ear normal.     Nose: Nose normal.     Mouth/Throat:     Mouth: Mucous membranes are moist.  Eyes:     General: No scleral icterus.       Right eye: No discharge.        Left eye: No discharge.  Cardiovascular:     Rate and Rhythm: Regular rhythm. Tachycardia present.     Pulses: Normal pulses.     Heart sounds: Normal heart sounds.  Pulmonary:     Effort: Pulmonary effort is normal. No respiratory distress.     Breath sounds: Normal breath sounds.  Abdominal:     General: Abdomen is flat. There is no distension.     Palpations: Abdomen is soft.     Tenderness: There is abdominal tenderness. There is no guarding or rebound.       Comments: Nonperitoneal,  Musculoskeletal:        General: Normal range of motion.     Cervical back: Normal range of motion.      Right lower leg: No edema.     Left lower leg: No edema.  Skin:    General: Skin is warm and dry.     Capillary Refill: Capillary refill takes less than 2 seconds.  Neurological:     Mental Status: She is alert and oriented to person, place, and time.     GCS: GCS eye subscore is 4. GCS verbal subscore is 5. GCS motor subscore is 6.  Psychiatric:        Mood and Affect: Mood normal.        Behavior: Behavior normal.    ED Results / Procedures / Treatments   Labs (all labs ordered are listed, but only abnormal results are displayed) Labs Reviewed  URINALYSIS, ROUTINE W REFLEX MICROSCOPIC - Abnormal; Notable for the following components:      Result Value   Glucose, UA 50 (*)    Ketones, ur 5 (*)    Protein, ur 30 (*)    Bacteria, UA RARE (*)    All other components within normal limits  BASIC METABOLIC PANEL - Abnormal; Notable for the following components:   Glucose, Bld 296 (*)    Calcium 8.7 (*)    All other components within normal limits  HEPATIC FUNCTION PANEL - Abnormal; Notable for the following components:   Albumin 3.4 (*)    AST 63 (*)    ALT 61 (*)    All other components within normal limits  LIPASE, BLOOD - Abnormal; Notable for the following components:   Lipase 70 (*)    All other components within normal limits  RESP PANEL BY RT-PCR (FLU A&B, COVID) ARPGX2  URINE CULTURE  CBC    EKG None  Radiology CT ABDOMEN PELVIS W CONTRAST  Result Date: 10/23/2021 CLINICAL DATA:  Left lower quadrant pain for 2 days EXAM: CT ABDOMEN AND PELVIS WITH CONTRAST TECHNIQUE: Multidetector CT imaging of the abdomen and pelvis was performed using the standard protocol following bolus administration of intravenous contrast. RADIATION DOSE REDUCTION: This exam was performed according to the departmental dose-optimization program which includes automated exposure control, adjustment of the mA and/or kV according to patient size and/or use of iterative reconstruction  technique. CONTRAST:  123m OMNIPAQUE IOHEXOL 300 MG/ML  SOLN COMPARISON:  04/04/2021 FINDINGS: Lower chest: No acute abnormality. Hepatobiliary: No focal liver abnormality is seen. No gallstones, gallbladder wall thickening, or biliary dilatation. Pancreas: Unremarkable. No pancreatic ductal dilatation or surrounding inflammatory changes. Spleen: Normal in size without focal abnormality. Adrenals/Urinary Tract: Adrenal glands are within normal limits. Kidneys demonstrate a normal enhancement pattern bilaterally. No renal calculi or obstructive changes are seen. The bladder is partially distended. Stomach/Bowel: Postsurgical changes are noted in the sigmoid colon. The anastomosis is widely patent. Scattered diverticular changes noted without evidence of diverticulitis. The appendix is not visualized and may have been surgically removed. Small bowel is within normal limits. Stomach is unremarkable. Vascular/Lymphatic: Aortic atherosclerosis. No enlarged abdominal or pelvic lymph nodes. Reproductive: Status post hysterectomy. No adnexal masses. Other: No abdominal wall hernia or abnormality. No abdominopelvic ascites. Musculoskeletal: No acute or significant osseous findings. IMPRESSION: Diverticulosis without diverticulitis. Status post sigmoid colectomy with patent anastomosis. No acute abnormality noted. Electronically Signed   By: MInez CatalinaM.D.   On: 10/23/2021 23:49    Procedures Procedures    Medications Ordered in ED Medications  HYDROcodone-acetaminophen (NORCO/VICODIN) 5-325 MG per tablet 1 tablet (has no administration in time range)  iohexol (OMNIPAQUE) 300 MG/ML solution 100 mL (100 mLs Intravenous Contrast Given 10/23/21 2332)  sodium chloride 0.9 % bolus 1,000 mL (0 mLs Intravenous Stopped 10/24/21 0137)  morphine (PF) 4 MG/ML injection 4 mg (4 mg Intravenous Given 10/24/21 0031)  ondansetron (ZOFRAN) injection 4 mg (4 mg Intravenous Given 10/24/21 0031)  ketorolac (TORADOL) 15 MG/ML injection  15 mg (15 mg Intravenous Given 10/24/21 0031)    ED Course/ Medical Decision Making/ A&P                           Medical Decision Making Amount and/or Complexity of Data Reviewed Labs: ordered.  Risk Prescription drug management.   Initial Impression and Ddx Serious etiology was considered, differential includes was not limited to diverticulitis, diverticulosis, nephrolithiasis, UTI, pyelonephritis, viral syndrome, MSK  Patient PMH that increases complexity of ED encounter: DM, diverticulosis  Further history obtained by spouse at bedside  Interpretation of Diagnostics I personally reviewed imaging and interpretation of CT abdomen pelvis with IV contrast, I agree with radiologist interpretation.  Diverticulosis noted without diverticulitis    Labs reviewed, mild ketonuria, no anion gap, low suspicion for DKA.  No evidence of UTI  She has minimal transaminitis, minimal elevation of her lipase, no frequent alcohol use.  CT does not show evidence of acute hepatic or pancreatic pathology.  No daily alcohol use.  Advise she follow-up with PCP regarding this.  Patient Reassessment and Ultimate Disposition/Management Give analgesics, IV fluids   Patient management required discussion with the following services or consulting groups:  None  Complexity of Problems Addressed Acute illness or injury that poses threat of  life of bodily function  Additional Data Reviewed and Analyzed Further history obtained from: Further history from spouse/family member, Past medical history and medications listed in the EMR, Prior ED visit notes, and Prior labs/imaging results  Patient Encounter Risk Assessment Prescriptions and Consideration of hospitalization  Symptoms today possibly secondary to diverticulosis.  Her work-up is reassuring.  Pain is greatly improved from intervention.  Ambulatory with steady gait.  Tolerant p.o. intake.   The patient's overall condition has improved, the patient  presents with abdominal pain without signs of peritonitis, or other life-threatening serious etiology. The patient understands that at this time there is no evidence for a more malignant underlying process, but the patient also understands that early in the process of an illness, an emergency department workup can be falsely reassuring. Detailed discussions were had with the patient regarding current findings, and need for close f/u with PCP or on call doctor. The patient appears stable for discharge and has been instructed to return immediately if the symptoms worsen in any way  for re-evaluation. Patient verbalized understanding and is in agreement with current care plan.  All questions answered prior to discharge.        Final Clinical Impression(s) / ED Diagnoses Final diagnoses:  Abdominal pain, unspecified abdominal location  Diverticulosis    Rx / DC Orders ED Discharge Orders          Ordered    oxyCODONE (ROXICODONE) 5 MG immediate release tablet  Every 4 hours PRN        10/24/21 0157    dicyclomine (BENTYL) 20 MG tablet  2 times daily        10/24/21 0157    sucralfate (CARAFATE) 1 g tablet  3 times daily with meals & bedtime        10/24/21 0157              Wynona Dove A, DO 10/24/21 0200

## 2021-10-25 LAB — URINE CULTURE: Culture: <10000 — AB

## 2021-11-03 ENCOUNTER — Telehealth: Payer: Self-pay | Admitting: Endocrinology

## 2021-11-03 NOTE — Telephone Encounter (Signed)
Patient called to advise that [facility] has been faxing requests for new RX and most recent office notes for her T-Slim pump.  Call back number is [phone] ?

## 2021-11-04 NOTE — Telephone Encounter (Signed)
Attempted to contact the patient regarding her chart notes from 05/2021 as well as to inform her that we will keep a look out for the forms from [employer] ?

## 2021-11-05 ENCOUNTER — Other Ambulatory Visit (HOSPITAL_COMMUNITY): Payer: Self-pay | Admitting: Internal Medicine

## 2021-11-05 DIAGNOSIS — Z1231 Encounter for screening mammogram for malignant neoplasm of breast: Secondary | ICD-10-CM

## 2021-11-05 NOTE — Telephone Encounter (Signed)
Spoke with the patient to receive clarification, will keep a look out for paperwork from [employer] to have the provider sign & will also attach office notes. ?

## 2021-11-09 ENCOUNTER — Telehealth: Payer: Self-pay | Admitting: Nutrition

## 2021-11-10 NOTE — Telephone Encounter (Signed)
Patient reported that she has not gotten her pump supplies for 1 month from [facility] care.  She said that they have faxed over several times a request for this.  I checked with [employer] and no forms have been received for these.  I called them and they said they sent another form at [facility] to my attention. ?As of 5 PM no form was received.  Pt. Called back and told her that I have left 1 month of pump supplies of cartridges and infusion set available at the front desk for her.   ?11/10/21  4:45PM;  no form from [employer] was ever faxed to [place] yesterday or today.  They were called again and they said it was faxed to another doctor with the same fax number as ours.  She addressed this fax to DR. Loanne Drilling at the number [phone] and said she would fax over the order now.  ? ?

## 2021-11-11 ENCOUNTER — Telehealth: Payer: Self-pay

## 2021-11-11 NOTE — Telephone Encounter (Signed)
Form for patient's pump supplies has now been signed by provider and faxed over to [facility] with recent chart notes. ? ?Faxed to: [phone] ?

## 2021-11-15 NOTE — Telephone Encounter (Signed)
Patient notified that paperwork was faxed on Thursday ?

## 2021-11-29 ENCOUNTER — Telehealth: Payer: Self-pay | Admitting: Orthopedic Surgery

## 2021-11-29 NOTE — Telephone Encounter (Signed)
Patient called and states her finger is still hurting and she doesn't want another shot.  She wants to know if there is a cream she can put on it over the counter.  She has been using the cream we gave her and it helps some but not as much as it did to begin with.  ? ?She can't even bend her hand / fist.  ? ?Please call her back [phone]. ?

## 2021-11-30 NOTE — Telephone Encounter (Signed)
Called patient to discuss treatment plan. No answer. Left generic message asking patient to call back.  ?

## 2021-11-30 NOTE — Telephone Encounter (Signed)
No there is not a cream, if she doesn't want surgery then take a popsickle stick and tape the finger straight for 10 days, sometimes that will work

## 2021-12-01 NOTE — Telephone Encounter (Signed)
Tried to speak with patient regarding her phone message. No answer, left generic message asking for call back. 2nd attempt ?

## 2021-12-07 NOTE — Telephone Encounter (Signed)
Patient called at 2:05 and left voicemail stating she has tried several times to reach Abby.  ? ?Please call her back at [phone] ?

## 2021-12-09 NOTE — Progress Notes (Signed)
? ?Referring Provider: Dr. [name] ?Primary Care Physician:  Celene Squibb, MD ?Primary Gastroenterologist:  Dr. Gala Romney ? ?Chief Complaint  ?Patient presents with  ? Colonoscopy  ? ? ?HPI:   ?Sarah [name] is a 61 y.o. female presenting today at the request of Dr. [name] for consult colonoscopy, but she is not currently due. Also reports abdominal pain, constipation, and discussed elevated LFTs.  ? ?Colonoscopy in August 2013 with friable anal canal with suspected trivial rectal bleeding from this location, colonic diverticulosis, s/p segmental resection, single diminutive polyp in the descending colon resected.  Pathology was benign.  Recommended 10-year repeat. ? ?Colonoscopy in January 2020 with Dr. Arnoldo Morale with moderate diverticulosis in the descending colon, otherwise normal exam.  Recommended 10-year repeat. ? ?Today:  ?Intermittent , mid low abdominal pain since having umbilical hernia repair in June 2021.  Described as cramping occurring after eating did not after every meal.  States it occurs 2 to 3 days a week.  She does feel daily need to call in some of her problems, but not always.  Also with constipation.  Having small, nonproductive bowel movements every 2 to 3 days.  Does not take anything regularly for this.  She is MiraLAX as needed which helps some, but not very much.  Extent of her hernia.  No stool softeners.  When bowels move well, her abdominal pain is less frequent.  Denies BRBPR, melena, unintentional weight loss, family history of colon cancer. ? ? ?Elevated LFTs: ?Chronic, dating back at least 5 years with AST and ALT elevated, but remaining less than 100.  On 10/23/2021, AST 63, ALT 61, alk phos and total bilirubin within normal limits.  Platelets normal.  CT A/P with contrast same day with no focal liver abnormalities or biliary abnormalities. ? ?No personal or family history of autoimmune conditions. ?No Fhx of liver disease.  ?Alcohol only on special occasion.  ?No history of illicit drug  use.  ?Takes Tylenol for trigger finger and arthritis in her hands- 2 in the morning.  ?No OTC supplements. ?Last A1C was 9 before insulin pump. Time for 3 month check.  ? ? ?Past Medical History:  ?Diagnosis Date  ? Anxiety   ? Diabetes mellitus   ? Diverticulosis   ? GERD (gastroesophageal reflux disease)   ? Hypercholesteremia   ? Hypertension   ? ? ?Past Surgical History:  ?Procedure Laterality Date  ? ABDOMINAL HYSTERECTOMY    ? complete  ? CARDIAC CATHETERIZATION  2011  ? COLONOSCOPY  01/04/2010  ? left sided transverse diverticula/two diminutive rectal polyps  ? COLONOSCOPY  03/28/2012  ? Surgeon: Daneil Dolin, MD; riable anal canal with suspected trivial rectal bleeding from this location, colonic diverticulosis, s/p segmental resection, single diminutive polyp in the descending colon resected.  Pathology was benign.  Recommended 10-year repeat.  ? COLONOSCOPY N/A 09/11/2018  ? Surgeon: Aviva Signs, MD;   moderate diverticulosis in the descending colon, otherwise normal exam.  Recommended 10-year repeat.  ? FOOT SURGERY    ? right-tendon repair  ? laparoscopic sigmoid colectomy  02/01/2010  ? Dr Jola Babinski  ? TRIGGER FINGER RELEASE Left 03/2015  ? TRIGGER FINGER RELEASE Right 02/25/2016  ? Procedure: RIGHT LONG TRIGGER FINGER RELEASE;  Surgeon: Carole Civil, MD;  Location: [facility];  Service: Orthopedics;  Laterality: Right;  ? UMBILICAL HERNIA REPAIR N/A 02/05/2020  ? Procedure: UMBILICAL HERNIORRHAPY WITH MESH;  Surgeon: Aviva Signs, MD;  Location: [facility];  Service: General;  Laterality: N/A;  ? ? ?  Current Outpatient Medications  ?Medication Sig Dispense Refill  ? aspirin EC 81 MG tablet Take 81 mg by mouth daily.    ? Continuous Blood Gluc Receiver ([place]) DEVI by Does not apply route.    ? diclofenac Sodium (VOLTAREN) 1 % GEL Apply 2 g topically 4 (four) times daily. 2 g 5  ? escitalopram (LEXAPRO) 5 MG tablet Take 5 mg by mouth at bedtime.     ? ibuprofen (ADVIL) 800  MG tablet Take 800 mg by mouth every 6 (six) hours as needed for moderate pain.    ? insulin aspart (NOVOLOG) 100 UNIT/ML injection 110u per day via insulin pump 3.4 mL 3  ? Insulin Infusion Pump (T: SLIM X2 INS PMP/CONTROL 7.4) DEVI 1 Device by Does not apply route every other day. This prescription is for T:slimX2 79m cartridges. 45 each 3  ? LORazepam (ATIVAN) 0.5 MG tablet Take 0.5 mg by mouth 2 (two) times daily as needed for anxiety.    ? losartan (COZAAR) 25 MG tablet Take 25 mg by mouth daily.    ? Magnesium 400 MG TABS Take 400 mg by mouth daily.    ? Multiple Vitamins-Minerals (MULTIVITAMIN PO) Take 1 tablet by mouth daily.    ? Semaglutide, 1 MG/DOSE, 4 MG/3ML SOPN Inject 1 mg as directed once a week. 9 mL 3  ? simvastatin (ZOCOR) 20 MG tablet Take 20 mg by mouth every Monday, Wednesday, and Friday.     ? metoprolol tartrate (LOPRESSOR) 50 MG tablet Take 1 tablet (50 mg total) by mouth 2 (two) times daily. (Patient taking differently: Take 50 mg by mouth 2 (two) times daily. Still taking metoprolol) 180 tablet 3  ? ?No current facility-administered medications for this visit.  ? ? ?Allergies as of 12/10/2021 - Review Complete 12/10/2021  ?Allergen Reaction Noted  ? Sulfonamide derivatives Itching   ? Latex Itching, Rash, and Other (See Comments) 12/12/2011  ? ? ?Family History  ?Problem Relation Age of Onset  ? Colon polyps Mother   ?     in her 69s ? Diabetes Mother   ? Hypertension Mother   ? Heart attack Father   ? Heart murmur Sister   ? Hypertension Brother   ? Colon cancer Maternal Grandfather   ?     611 ? Diabetes Son   ? Liver disease Neg Hx   ? ? ?Social History  ? ?Socioeconomic History  ? Marital status: Married  ?  Spouse name: Not on file  ? Number of children: 1  ? Years of education: Not on file  ? Highest education level: Not on file  ?Occupational History  ? Occupation: K tPharmacist, hospitalasst  ?  Employer: [employer] ?Tobacco Use  ? Smoking status: Former  ?  Packs/day: 1.00  ?   Years: 10.00  ?  Pack years: 10.00  ?  Types: Cigarettes  ?  Quit date: 09/05/2006  ?  Years since quitting: 15.2  ? Smokeless tobacco: Never  ?Vaping Use  ? Vaping Use: Never used  ?Substance and Sexual Activity  ? Alcohol use: Yes  ?  Comment: occasional  ? Drug use: No  ? Sexual activity: Yes  ?  Birth control/protection: Surgical  ?  Comment: hyst  ?Other Topics Concern  ? Not on file  ?Social History Narrative  ? Lives w/ husband, sister & her children , teen mom & her baby  ? ?Social Determinants of Health  ? ?Financial  Resource Strain: Not on file  ?Food Insecurity: Not on file  ?Transportation Needs: Not on file  ?Physical Activity: Not on file  ?Stress: Not on file  ?Social Connections: Not on file  ?Intimate Partner Violence: Not on file  ? ? ?Review of Systems: ?Gen: Denies any fever, chills, or flulike symptoms, presyncope, syncope. ?CV: Admits to occasional, chronic, chest discomfort, but nothing significant and goes away when she "gets herself together".  PCP and cardiologist are aware.  Has history of heart palpitations on metoprolol. ?Resp: Admits to shortness of breath with exertion.  Shortness of breath at rest.  No cough. ?GI: See HPI ?GU : Denies urinary burning, urinary frequency, urinary hesitancy ?MS: Admits to joint pain in her hands. ?Derm: Denies rash. ?Psych: Denies depression.  Admits to anxiety. ?Heme: See HPI ? ?Physical Exam: ?BP 132/74   Pulse 80   Temp 97.7 ?F (36.5 ?C) (Temporal)   Ht '5\' 3"'  (1.6 m)   Wt 195 lb (88.5 kg)   BMI 34.54 kg/m?  ?General:   Alert and oriented. Pleasant and cooperative. Well-nourished and well-developed.  ?Head:  Normocephalic and atraumatic. ?Eyes:  Without icterus, sclera clear and conjunctiva pink.  ?Ears:  Normal auditory acuity. ?Lungs:  Clear to auscultation bilaterally. No wheezes, rales, or rhonchi. No distress.  ?Heart:  S1, S2 present without murmurs appreciated.  ?Abdomen:  +BS, soft, and non-distended.  Mild TTP in the mid lower abdomen just  below the umbilicus/suprapubic area.  Well-healed scar just below her umbilicus.  No HSM noted. No guarding or rebound. No masses appreciated.  ?Rectal:  Deferred  ?Msk:  Symmetrical without gross deformities. No

## 2021-12-10 ENCOUNTER — Encounter: Payer: Self-pay | Admitting: Gastroenterology

## 2021-12-10 ENCOUNTER — Ambulatory Visit (INDEPENDENT_AMBULATORY_CARE_PROVIDER_SITE_OTHER): Payer: BC Managed Care – PPO | Admitting: Gastroenterology

## 2021-12-10 ENCOUNTER — Other Ambulatory Visit: Payer: Self-pay | Admitting: Orthopedic Surgery

## 2021-12-10 ENCOUNTER — Encounter: Payer: Self-pay | Admitting: *Deleted

## 2021-12-10 VITALS — BP 132/74 | HR 80 | Temp 97.7°F | Ht 63.0 in | Wt 195.0 lb

## 2021-12-10 DIAGNOSIS — K5900 Constipation, unspecified: Secondary | ICD-10-CM

## 2021-12-10 DIAGNOSIS — R1030 Lower abdominal pain, unspecified: Secondary | ICD-10-CM | POA: Diagnosis not present

## 2021-12-10 DIAGNOSIS — R7989 Other specified abnormal findings of blood chemistry: Secondary | ICD-10-CM | POA: Diagnosis not present

## 2021-12-10 MED ORDER — MELOXICAM 7.5 MG PO TABS: 7.5000 mg | ORAL_TABLET | Freq: Every day | ORAL | 5 refills | Status: DC

## 2021-12-10 NOTE — Progress Notes (Signed)
Meds ordered this encounter  Medications   meloxicam (MOBIC) 7.5 MG tablet    Sig: Take 1 tablet (7.5 mg total) by mouth daily.    Dispense:  30 tablet    Refill:  5    

## 2021-12-10 NOTE — Patient Instructions (Addendum)
Please have labs completed at [place]. ? ?We will arrange for you to have an [place] of your abdomen at [facility].  ? ?Start Linzess 72 mcg daily 30 minutes before breakfast. We are providing you with samples today. Call with a progress report in 1 week and let me know how it worked. We have higher doses if needed.  ? ?Instructions for fatty liver: ?Recommend 1-2# weight loss per week until ideal body weight through exercise & diet. ?Low fat/cholesterol diet.   ?Avoid sweets, sodas, fruit juices, sweetened beverages like tea, etc. ?Gradually increase exercise from 15 min daily up to 1 hr per day 5 days/week. ?Continue to limit alcohol use. ?Avoid over the counter supplements/herbal teas.  ? ?It is also very important to keep good control of your diabetes.  ? ?We will plan to see you back in 3 months.  Do not hesitate to call if you have any questions or concerns prior to your next visit. ? ?It was very nice to meet you today! ? ?Aliene Altes, PA-C ?[facility] ? ? ? ? ? ?

## 2021-12-10 NOTE — Telephone Encounter (Signed)
Spoke with patient. She is asking if there any medication you can call in for her for the arthritis. She states her hands hurt.  ?

## 2021-12-14 LAB — HEPATITIS C ANTIBODY
Hepatitis C Ab: NONREACTIVE
SIGNAL TO CUT-OFF: 0.39 (ref <1.00)

## 2021-12-14 LAB — HEPATIC FUNCTION PANEL
AG Ratio: 1 (calc) (ref 1.0–2.5)
ALT: 30 U/L — ABNORMAL HIGH (ref 6–29)
AST: 25 U/L (ref 10–35)
Albumin: 3.5 g/dL — ABNORMAL LOW (ref 3.6–5.1)
Alkaline phosphatase (APISO): 85 U/L (ref 37–153)
Bilirubin, Direct: 0.1 mg/dL (ref 0.0–0.2)
Globulin: 3.4 g/dL (calc) (ref 1.9–3.7)
Indirect Bilirubin: 0.3 mg/dL (calc) (ref 0.2–1.2)
Total Bilirubin: 0.4 mg/dL (ref 0.2–1.2)
Total Protein: 6.9 g/dL (ref 6.1–8.1)

## 2021-12-14 LAB — IRON,TIBC AND FERRITIN PANEL
%SAT: 48 % (calc) — ABNORMAL HIGH (ref 16–45)
Ferritin: 202 ng/mL (ref 16–288)
Iron: 151 ug/dL (ref 45–160)
TIBC: 317 mcg/dL (calc) (ref 250–450)

## 2021-12-14 LAB — HEPATITIS B SURFACE ANTIBODY,QUALITATIVE: Hep B S Ab: NONREACTIVE

## 2021-12-14 LAB — HEPATITIS A ANTIBODY, TOTAL: Hepatitis A AB,Total: REACTIVE — AB

## 2021-12-14 LAB — HEPATITIS B CORE ANTIBODY, TOTAL: Hep B Core Total Ab: NONREACTIVE

## 2021-12-15 LAB — HEPATITIS B SURFACE ANTIGEN: Hepatitis B Surface Ag: NONREACTIVE

## 2021-12-15 IMAGING — CT CT RENAL STONE PROTOCOL
2 of 4 series · 16 of 46 positions shown, 18 images · non-contrast
Comparison: Abdominopelvic CT 03/01/2020

CLINICAL DATA: Flank pain.  Low back pain with dysuria.

EXAM:
CT ABDOMEN AND PELVIS WITHOUT CONTRAST
TECHNIQUE: Multidetector CT imaging of the abdomen and pelvis was performed
following the standard protocol without IV contrast.

[Series 2: axial st · axial · 0.77mm/px · z∈[+1042,+1442]mm · 13 of 92 slices shown, 15 images]
[im 6/92  soft-tissue]
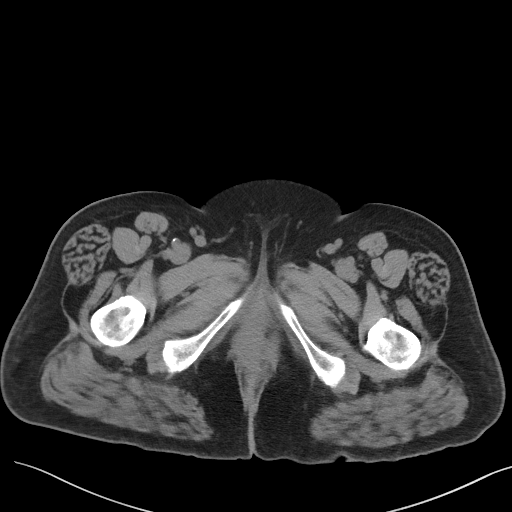
[im 6/92  bone]
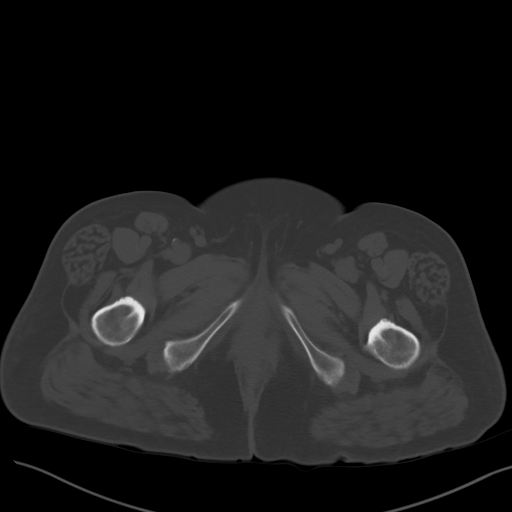
[im 11/92  soft-tissue]
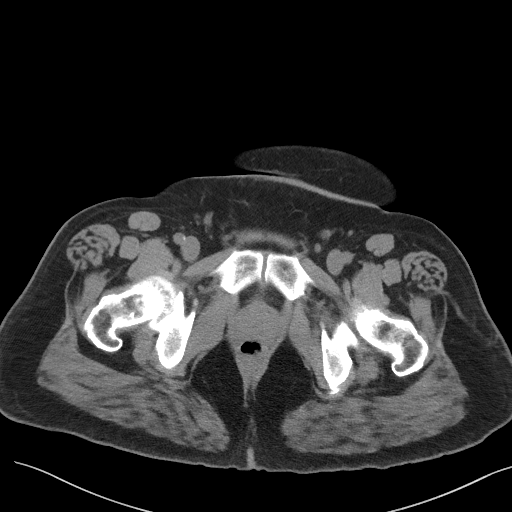
[im 22/92  soft-tissue]
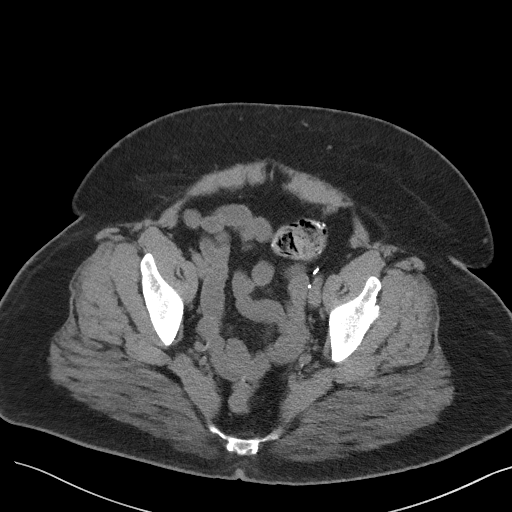
[im 27/92  soft-tissue]
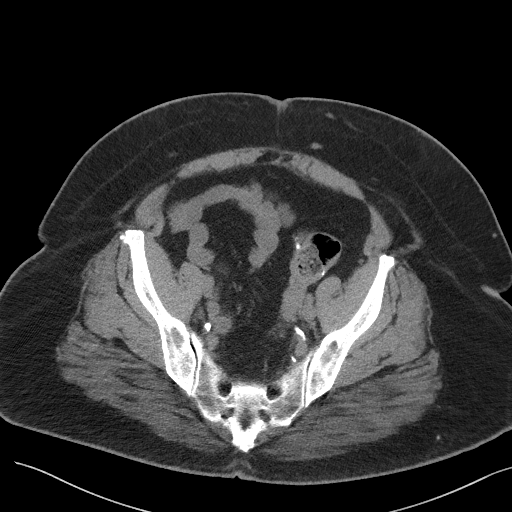
[im 33/92  soft-tissue]
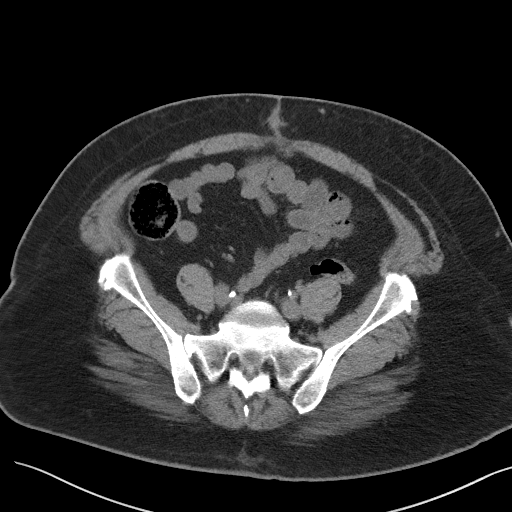
[im 38/92  soft-tissue]
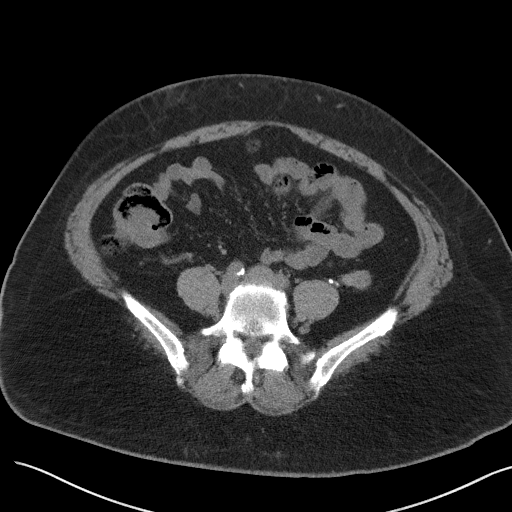
[im 49/92  soft-tissue]
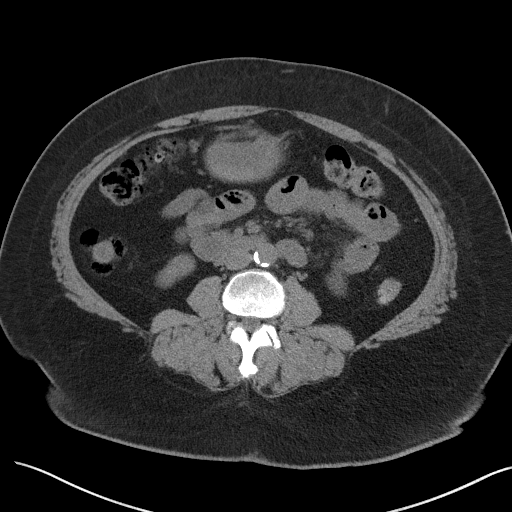
[im 54/92  soft-tissue]
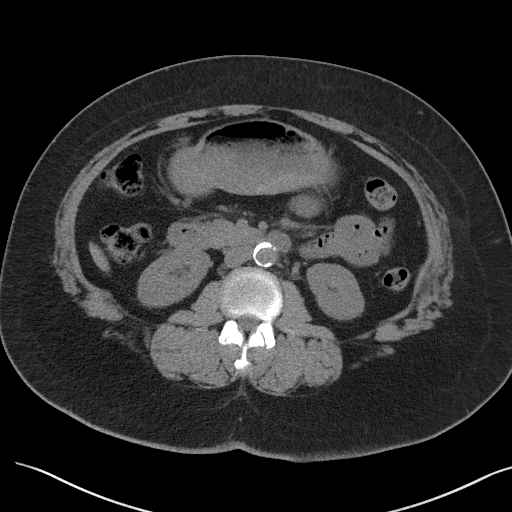
[im 59/92  soft-tissue]
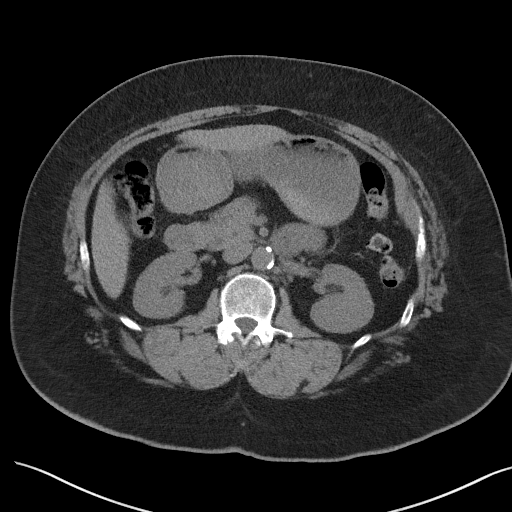
[im 59/92  bone]
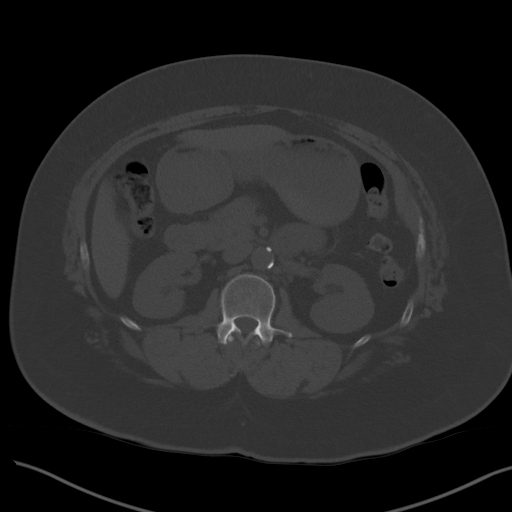
[im 65/92  soft-tissue]
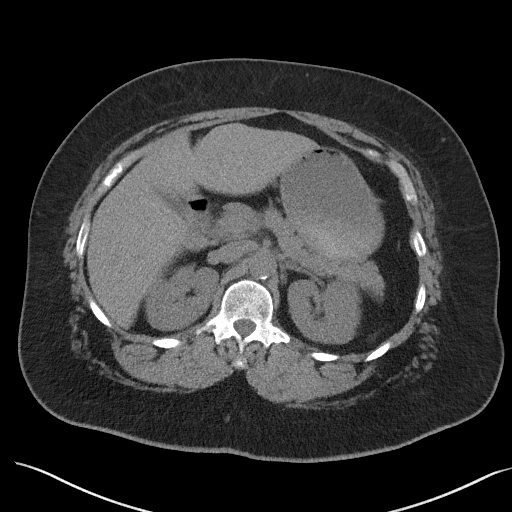
[im 70/92  soft-tissue]
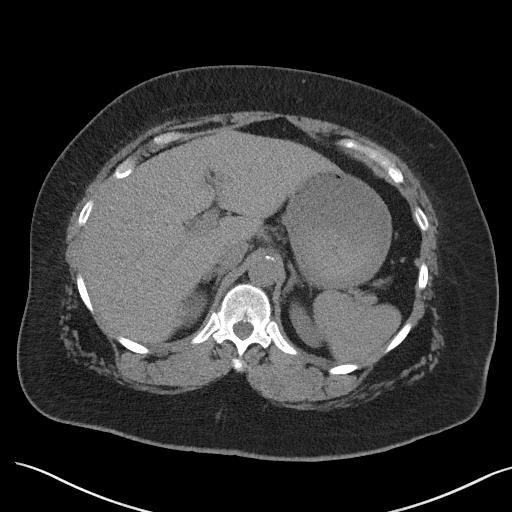
[im 81/92  soft-tissue]
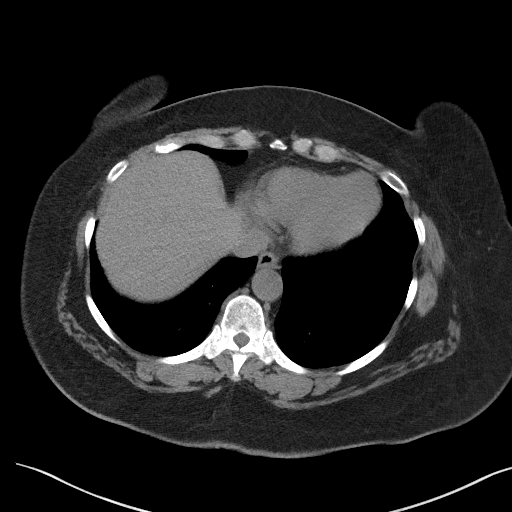
[im 86/92  soft-tissue]
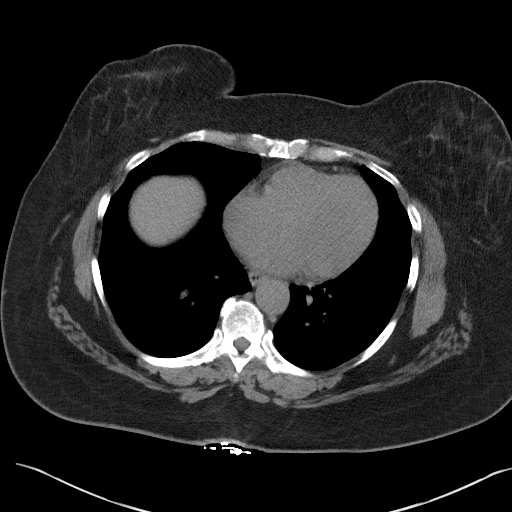

[Series 5: coronal st · coronal · 0.72mm/px · 3 of 101 slices shown]
[im 34/101  soft-tissue]
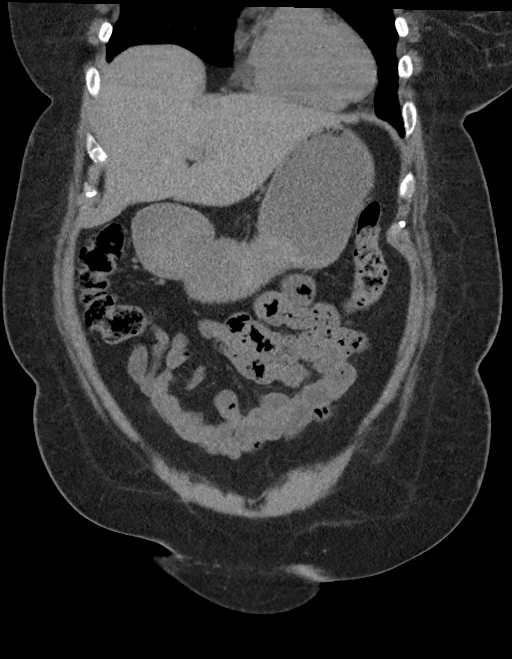
[im 45/101  soft-tissue]
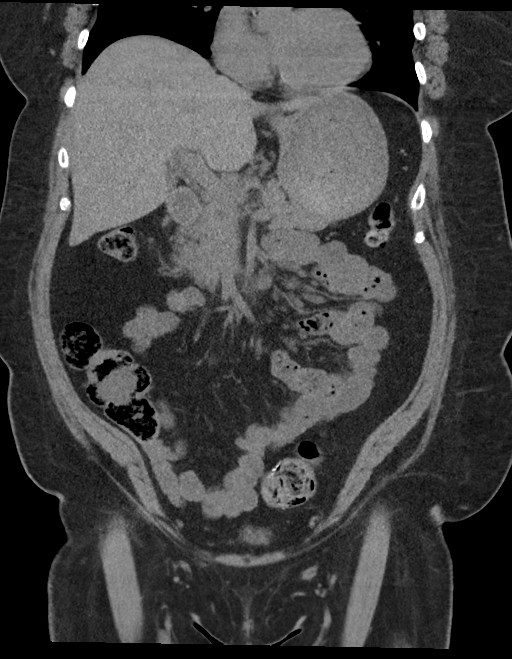
[im 56/101  soft-tissue]
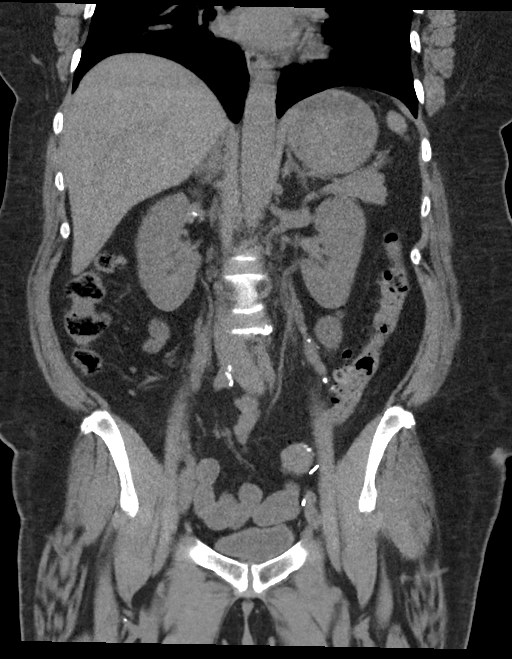

[16 of 46 positions shown; findings below may reference images not displayed]

FINDINGS: Lower chest: The lung bases are clear. The heart is normal in size.
No pleural fluid.

Hepatobiliary: No evidence of focal hepatic lesion on noncontrast
exam. Decompressed gallbladder. No calcified gallstone. No
pericholecystic inflammation or biliary dilatation.

Pancreas: No ductal dilatation or inflammation.

Spleen: Normal in size without focal abnormality.

Adrenals/Urinary Tract: No adrenal nodule. No hydronephrosis. No
renal calculi. No perinephric edema. Both ureters are decompressed
without stones along the course. Urinary bladder is partially
distended. No bladder stone. No perivesicular fat stranding.

Stomach/Bowel: Stomach distended with ingested contents. Normal
positioning of the ligament of Treitz. No small bowel obstruction or
abnormal distention. No small bowel inflammation. The appendix is
not definitively visualized. Multifocal colonic diverticulosis. No
diverticulitis or acute inflammation. Enteric sutures noted in the
sigmoid colon.

Vascular/Lymphatic: Moderate to advanced aorto bi-iliac
atherosclerosis. Moderate aortic branch atherosclerosis. No
aneurysm. No enlarged lymph nodes in the abdomen or pelvis.

Reproductive: Status post hysterectomy. No adnexal masses.

Other: Mild scarring at the umbilicus likely related to prior hernia
repair. No evidence of recurrent hernia. No free air or ascites.

Musculoskeletal: Mild degenerative change of the hips and in the
lumbar spine. There are no acute or suspicious osseous
abnormalities.
IMPRESSION: 1. No renal stones or obstructive uropathy. No acute abnormality in
the abdomen/pelvis.
2. Colonic diverticulosis without diverticulitis.

Aortic Atherosclerosis ([id number].J).

## 2021-12-16 ENCOUNTER — Ambulatory Visit (HOSPITAL_COMMUNITY): Payer: BC Managed Care – PPO

## 2021-12-17 ENCOUNTER — Telehealth: Payer: Self-pay | Admitting: Internal Medicine

## 2021-12-17 NOTE — Telephone Encounter (Signed)
See previous note

## 2021-12-17 NOTE — Telephone Encounter (Signed)
Pt called asking for Aliene Altes, [place]. I told her everyone was with patients and I could take a message. She said she left a message yesterday and no one has called her back. I told her I would take another message, but everyone was with patients. She is wanting her test results. I told her the nurse would call her when they are reviewed and signed off by the provider, [phone] ?

## 2021-12-17 NOTE — Telephone Encounter (Signed)
See result note.  

## 2021-12-20 ENCOUNTER — Telehealth: Payer: Self-pay | Admitting: *Deleted

## 2021-12-20 NOTE — Telephone Encounter (Signed)
Patient stopped by office and picked up sample of linzess 130mg #2 boxes. ?

## 2021-12-23 ENCOUNTER — Ambulatory Visit (HOSPITAL_COMMUNITY)
Admission: RE | Admit: 2021-12-23 | Discharge: 2021-12-23 | Disposition: A | Discharge status: Home / Self Care | Payer: BC Managed Care – PPO | Source: Ambulatory Visit | Attending: Gastroenterology | Admitting: Gastroenterology

## 2021-12-23 DIAGNOSIS — R7989 Other specified abnormal findings of blood chemistry: Secondary | ICD-10-CM | POA: Insufficient documentation

## 2021-12-27 ENCOUNTER — Other Ambulatory Visit: Payer: Self-pay | Admitting: Gastroenterology

## 2021-12-27 DIAGNOSIS — K581 Irritable bowel syndrome with constipation: Secondary | ICD-10-CM

## 2021-12-27 MED ORDER — LINACLOTIDE 145 MCG PO CAPS: 145.0000 ug | ORAL_CAPSULE | Freq: Every day | ORAL | 1 refills | Status: DC

## 2022-01-13 ENCOUNTER — Telehealth: Payer: Self-pay | Admitting: *Deleted

## 2022-01-13 NOTE — Telephone Encounter (Signed)
Spoke to pt, she is inquiring if she could take Coenzyme Q10.  She asked her PCP and he told her to ask her GI doctor.

## 2022-01-13 NOTE — Telephone Encounter (Signed)
That should be fine. 

## 2022-01-18 NOTE — Telephone Encounter (Signed)
Spoke to pt, informed her that it was fine to take.

## 2022-03-03 ENCOUNTER — Other Ambulatory Visit: Payer: Self-pay

## 2022-03-03 ENCOUNTER — Emergency Department (HOSPITAL_COMMUNITY)
Admission: EM | Admit: 2022-03-03 | Discharge: 2022-03-03 | Disposition: A | Discharge status: Home / Self Care | Payer: BC Managed Care – PPO | Source: Home / Self Care | Attending: Emergency Medicine | Admitting: Emergency Medicine

## 2022-03-03 ENCOUNTER — Emergency Department (HOSPITAL_COMMUNITY): Payer: BC Managed Care – PPO

## 2022-03-03 ENCOUNTER — Encounter (HOSPITAL_COMMUNITY): Payer: Self-pay | Admitting: Emergency Medicine

## 2022-03-03 DIAGNOSIS — R079 Chest pain, unspecified: Secondary | ICD-10-CM | POA: Insufficient documentation

## 2022-03-03 DIAGNOSIS — N10 Acute tubulo-interstitial nephritis: Secondary | ICD-10-CM | POA: Diagnosis not present

## 2022-03-03 DIAGNOSIS — Z20822 Contact with and (suspected) exposure to covid-19: Secondary | ICD-10-CM | POA: Insufficient documentation

## 2022-03-03 DIAGNOSIS — R059 Cough, unspecified: Secondary | ICD-10-CM | POA: Insufficient documentation

## 2022-03-03 DIAGNOSIS — Z7982 Long term (current) use of aspirin: Secondary | ICD-10-CM | POA: Insufficient documentation

## 2022-03-03 DIAGNOSIS — Z794 Long term (current) use of insulin: Secondary | ICD-10-CM | POA: Insufficient documentation

## 2022-03-03 DIAGNOSIS — R109 Unspecified abdominal pain: Secondary | ICD-10-CM | POA: Insufficient documentation

## 2022-03-03 DIAGNOSIS — N12 Tubulo-interstitial nephritis, not specified as acute or chronic: Secondary | ICD-10-CM

## 2022-03-03 DIAGNOSIS — R300 Dysuria: Secondary | ICD-10-CM | POA: Insufficient documentation

## 2022-03-03 DIAGNOSIS — Z79899 Other long term (current) drug therapy: Secondary | ICD-10-CM | POA: Insufficient documentation

## 2022-03-03 DIAGNOSIS — R0602 Shortness of breath: Secondary | ICD-10-CM | POA: Insufficient documentation

## 2022-03-03 DIAGNOSIS — Z91040 Latex allergy status: Secondary | ICD-10-CM | POA: Insufficient documentation

## 2022-03-03 LAB — COMPREHENSIVE METABOLIC PANEL
ALT: 27 U/L (ref 0–44)
AST: 17 U/L (ref 15–41)
Albumin: 3.6 g/dL (ref 3.5–5.0)
Alkaline Phosphatase: 91 U/L (ref 38–126)
Anion gap: 9 (ref 5–15)
BUN: 21 mg/dL (ref 8–23)
CO2: 27 mmol/L (ref 22–32)
Calcium: 9.1 mg/dL (ref 8.9–10.3)
Chloride: 102 mmol/L (ref 98–111)
Creatinine, Ser: 0.75 mg/dL (ref 0.44–1.00)
GFR, Estimated: >60 mL/min (ref >60)
Glucose, Bld: 174 mg/dL — ABNORMAL HIGH (ref 70–99)
Potassium: 3.8 mmol/L (ref 3.5–5.1)
Sodium: 138 mmol/L (ref 135–145)
Total Bilirubin: 0.9 mg/dL (ref 0.3–1.2)
Total Protein: 8.1 g/dL (ref 6.5–8.1)

## 2022-03-03 LAB — CBC WITH DIFFERENTIAL/PLATELET
Abs Immature Granulocytes: 0.03 10*3/uL (ref 0.00–0.07)
Basophils Absolute: 0 10*3/uL (ref 0.0–0.1)
Basophils Relative: 0 %
Eosinophils Absolute: 0.1 10*3/uL (ref 0.0–0.5)
Eosinophils Relative: 2 %
HCT: 40.1 % (ref 36.0–46.0)
Hemoglobin: 14.3 g/dL (ref 12.0–15.0)
Immature Granulocytes: 0 %
Lymphocytes Relative: 42 %
Lymphs Abs: 3.3 10*3/uL (ref 0.7–4.0)
MCH: 28.8 pg (ref 26.0–34.0)
MCHC: 35.7 g/dL (ref 30.0–36.0)
MCV: 80.7 fL (ref 80.0–100.0)
Monocytes Absolute: 0.2 10*3/uL (ref 0.1–1.0)
Monocytes Relative: 2 %
Neutro Abs: 4.2 10*3/uL (ref 1.7–7.7)
Neutrophils Relative %: 54 %
Platelets: 277 10*3/uL (ref 150–400)
RBC: 4.97 MIL/uL (ref 3.87–5.11)
RDW: 14.7 % (ref 11.5–15.5)
WBC: 7.8 10*3/uL (ref 4.0–10.5)
nRBC: 0 % (ref 0.0–0.2)

## 2022-03-03 LAB — URINALYSIS, ROUTINE W REFLEX MICROSCOPIC
Bilirubin Urine: NEGATIVE
Glucose, UA: NEGATIVE mg/dL
Ketones, ur: NEGATIVE mg/dL
Nitrite: NEGATIVE
Protein, ur: 30 mg/dL — AB
RBC / HPF: >50 RBC/hpf — ABNORMAL HIGH (ref 0–5)
Specific Gravity, Urine: 1.016 (ref 1.005–1.030)
WBC, UA: >50 WBC/hpf — ABNORMAL HIGH (ref 0–5)
pH: 6 (ref 5.0–8.0)

## 2022-03-03 LAB — RESP PANEL BY RT-PCR (FLU A&B, COVID) ARPGX2
Influenza A by PCR: NEGATIVE
Influenza B by PCR: NEGATIVE
SARS Coronavirus 2 by RT PCR: NEGATIVE

## 2022-03-03 LAB — LACTIC ACID, PLASMA
Lactic Acid, Venous: 1.6 mmol/L (ref 0.5–1.9)
Lactic Acid, Venous: 1.8 mmol/L (ref 0.5–1.9)

## 2022-03-03 LAB — PROTIME-INR
INR: 1 (ref 0.8–1.2)
Prothrombin Time: 13.4 seconds (ref 11.4–15.2)

## 2022-03-03 LAB — APTT: aPTT: 30 seconds (ref 24–36)

## 2022-03-03 LAB — TROPONIN I (HIGH SENSITIVITY)
Troponin I (High Sensitivity): 9 ng/L (ref <18)
Troponin I (High Sensitivity): 8 ng/L (ref <18)

## 2022-03-03 MED ORDER — SODIUM CHLORIDE 0.9 % IV SOLN
2.0000 g | INTRAVENOUS | Status: DC
  Administered 2022-03-03: 2 g via INTRAVENOUS
  Filled 2022-03-03: qty 20

## 2022-03-03 MED ORDER — ONDANSETRON HCL 4 MG PO TABS: 4.0000 mg | ORAL_TABLET | Freq: Three times a day (TID) | ORAL | 0 refills | Status: DC | PRN

## 2022-03-03 MED ORDER — LACTATED RINGERS IV BOLUS (SEPSIS)
1000.0000 mL | Freq: Once | INTRAVENOUS | Status: AC
Start: 2022-03-03 — End: 2022-03-03
  Administered 2022-03-03: 1000 mL via INTRAVENOUS

## 2022-03-03 MED ORDER — DIPHENHYDRAMINE HCL 25 MG PO CAPS
25.0000 mg | ORAL_CAPSULE | Freq: Once | ORAL | Status: AC
  Administered 2022-03-03: 25 mg via ORAL
  Filled 2022-03-03: qty 1

## 2022-03-03 MED ORDER — FENTANYL CITRATE PF 50 MCG/ML IJ SOSY
50.0000 ug | PREFILLED_SYRINGE | Freq: Once | INTRAMUSCULAR | Status: AC
  Administered 2022-03-03: 50 ug via INTRAVENOUS
  Filled 2022-03-03: qty 1

## 2022-03-03 MED ORDER — LACTATED RINGERS IV BOLUS (SEPSIS): 1000.0000 mL | Freq: Once | INTRAVENOUS | Status: DC

## 2022-03-03 MED ORDER — CEPHALEXIN 500 MG PO CAPS: 500.0000 mg | ORAL_CAPSULE | Freq: Four times a day (QID) | ORAL | 0 refills | Status: DC

## 2022-03-03 MED ORDER — IOHEXOL 300 MG/ML  SOLN
100.0000 mL | Freq: Once | INTRAMUSCULAR | Status: AC | PRN
  Administered 2022-03-03: 100 mL via INTRAVENOUS

## 2022-03-03 MED ORDER — ACETAMINOPHEN 500 MG PO TABS
1000.0000 mg | ORAL_TABLET | Freq: Once | ORAL | Status: AC
  Administered 2022-03-03: 1000 mg via ORAL
  Filled 2022-03-03: qty 2

## 2022-03-03 MED ORDER — LACTATED RINGERS IV BOLUS (SEPSIS)
1000.0000 mL | Freq: Once | INTRAVENOUS | Status: AC
  Administered 2022-03-03: 1000 mL via INTRAVENOUS

## 2022-03-03 MED ORDER — LACTATED RINGERS IV SOLN: INTRAVENOUS | Status: DC

## 2022-03-03 MED ORDER — ONDANSETRON HCL 4 MG/2ML IJ SOLN: 4.0000 mg | Freq: Once | INTRAMUSCULAR | Status: DC

## 2022-03-03 MED ORDER — HYDROMORPHONE HCL 1 MG/ML IJ SOLN
1.0000 mg | Freq: Once | INTRAMUSCULAR | Status: AC
  Administered 2022-03-03: 1 mg via INTRAVENOUS
  Filled 2022-03-03: qty 1

## 2022-03-03 MED ORDER — LACTATED RINGERS IV BOLUS
1000.0000 mL | Freq: Once | INTRAVENOUS | Status: AC
Start: 2022-03-03 — End: 2022-03-03
  Administered 2022-03-03: 1000 mL via INTRAVENOUS

## 2022-03-03 NOTE — Sepsis Progress Note (Signed)
Following per sepsis protocol   

## 2022-03-03 NOTE — ED Provider Notes (Signed)
Signout from Dr. Dayna Barker.  61 year old female with chest and abdominal pain urinary frequency dysuria.  Found to have a UTI.  She is pending delta troponin.  If negative treat for UTI discharge. Physical Exam  BP 118/66   Pulse 93   Temp 99.6 F (37.6 C) (Oral)   Resp 20   Ht '5\' 3"'$  (1.6 m)   Wt 85.7 kg   SpO2 92%   BMI 33.48 kg/m   Physical Exam  Procedures  Procedures  ED Course / MDM    Medical Decision Making Amount and/or Complexity of Data Reviewed Labs: ordered. Radiology: ordered. ECG/medicine tests: ordered.  Risk OTC drugs. Prescription drug management.   Second troponin does not show any significant elevation.  Reviewed with patient.  We will provide prescription for antibiotics and recommend close PCP follow-up.       Hayden Rasmussen, MD 03/03/22 1800

## 2022-03-03 NOTE — ED Triage Notes (Signed)
Pt c/o chest/abd pain and states she has been urinating a lot. Pt states she is freezing.

## 2022-03-03 NOTE — Sepsis Progress Note (Addendum)
Sent secure chat to nurse to follow up on blood cultures, nurse responded that she will check with lab.  Antibiotics were not delayed.Marland Kitchen

## 2022-03-03 NOTE — Discharge Instructions (Addendum)
You were seen in the emergency department for urinary symptoms chest and abdominal pain.  We are treating you with antibiotics for a urinary tract infection.  Please drink plenty of fluids and rest.  Follow-up with your regular doctor.  Return to the emergency department if any worsening or concerning symptoms

## 2022-03-03 NOTE — ED Provider Notes (Addendum)
[facility] Provider Note   CSN: [id number] Arrival date & time: 03/03/22  0340     History  Chief Complaint  Patient presents with   Chest Pain    Sarah [name] is a 61 y.o. female.  61 yo F here with multiple symptoms. Has had increased urinary frequency with dysuria and decreased urination. Now with some right sided abdominal pain wrapping around her back associated with it too. Took a linzess which initially helped but then started having chest pain, sob and nonproductive cough. Had chills with all of this too.    Chest Pain      Home Medications Prior to Admission medications   Medication Sig Start Date End Date Taking? Authorizing Provider  aspirin EC 81 MG tablet Take 81 mg by mouth daily.    [provider]  Continuous Blood Gluc Receiver ([place]) [place] by Does not apply route.    [provider]  diclofenac Sodium (VOLTAREN) 1 % GEL Apply 2 g topically 4 (four) times daily. 01/27/21   Carole Civil, MD  escitalopram (LEXAPRO) 5 MG tablet Take 5 mg by mouth at bedtime.  07/22/19   [provider]  ibuprofen (ADVIL) 800 MG tablet Take 800 mg by mouth every 6 (six) hours as needed for moderate pain.    [provider]  insulin aspart (NOVOLOG) 100 UNIT/ML injection 110u per day via insulin pump 06/22/21   Renato Shin, MD  Insulin Infusion Pump (T: SLIM X2 INS PMP/CONTROL 7.4) DEVI 1 Device by Does not apply route every other day. This prescription is for T:slimX2 16m cartridges. 07/07/21   ERenato Shin MD  linaclotide ([facility] 145 MCG CAPS capsule Take 1 capsule (145 mcg total) by mouth daily before breakfast. 12/27/21   HErenest Rasher PA-C  LORazepam (ATIVAN) 0.5 MG tablet Take 0.5 mg by mouth 2 (two) times daily as needed for anxiety. 11/28/19   [provider]  losartan (COZAAR) 25 MG tablet Take 25 mg by mouth daily.    [provider]  Magnesium 400 MG TABS Take 400 mg by  mouth daily.    [provider]  meloxicam (MOBIC) 7.5 MG tablet Take 1 tablet (7.5 mg total) by mouth daily. 12/10/21   HCarole Civil MD  metoprolol tartrate (LOPRESSOR) 50 MG tablet Take 1 tablet (50 mg total) by mouth 2 (two) times daily. Patient taking differently: Take 50 mg by mouth 2 (two) times daily. Still taking metoprolol 07/26/19 06/16/21  KHerminio Commons MD  Multiple Vitamins-Minerals (MULTIVITAMIN PO) Take 1 tablet by mouth daily.    [provider]  Semaglutide, 1 MG/DOSE, 4 MG/3ML SOPN Inject 1 mg as directed once a week. 06/15/21   ERenato Shin MD  simvastatin (ZOCOR) 20 MG tablet Take 20 mg by mouth every Monday, Wednesday, and Friday.  10/19/19   [provider]      Allergies    Sulfonamide derivatives and Latex    Review of Systems   Review of Systems  Cardiovascular:  Positive for chest pain.    Physical Exam Updated Vital Signs BP (!) 141/53   Pulse (!) 102   Temp 100.1 F (37.8 C) (Oral)   Resp (!) 24   Ht '5\' 3"'$  (1.6 m)   Wt 85.7 kg   SpO2 97%   BMI 33.48 kg/m  Physical Exam Vitals and nursing note reviewed.  Constitutional:      Appearance: She is well-developed.  HENT:  Head: Normocephalic and atraumatic.  Cardiovascular:     Rate and Rhythm: Regular rhythm. Tachycardia present.  Pulmonary:     Effort: Tachypnea present. No respiratory distress.     Breath sounds: No stridor.  Abdominal:     General: There is no distension.  Musculoskeletal:        General: Normal range of motion.     Cervical back: Normal range of motion.     Right lower leg: No edema.     Left lower leg: No edema.  Skin:    General: Skin is warm and dry.  Neurological:     Mental Status: She is alert.     ED Results / Procedures / Treatments   Labs (all labs ordered are listed, but only abnormal results are displayed) Labs Reviewed  URINE CULTURE  CBC WITH DIFFERENTIAL/PLATELET  URINALYSIS, ROUTINE W REFLEX MICROSCOPIC   COMPREHENSIVE METABOLIC PANEL  LACTIC ACID, PLASMA  LACTIC ACID, PLASMA    EKG None  Radiology No results found.  Procedures Procedures    Medications Ordered in ED Medications  lactated ringers bolus 1,000 mL (has no administration in time range)  fentaNYL (SUBLIMAZE) injection 50 mcg (50 mcg Intravenous Given 03/03/22 0427)    ED Course/ Medical Decision Making/ A&P                           Medical Decision Making Amount and/or Complexity of Data Reviewed Labs: ordered. Radiology: ordered. ECG/medicine tests: ordered.  Risk OTC drugs. Prescription drug management.  Patient appears to be having chills now, will check rectal temp as I'm concerned for possible sepsis related to UTI or pneumonia. Otherwise appears well.  Did have fever. Improved with meds. No obvious source of infection. Symptoms improved. Urine with some bacteria, will culture, abx started. Chest pain resolved but will plan for second troponin to ensure no changes, likely D/C if normal and still asymptomatic.    Final Clinical Impression(s) / ED Diagnoses Final diagnoses:  None    Rx / DC Orders ED Discharge Orders     None         Larance Ratledge, Corene Cornea, MD 03/08/22 1245    Merrily Pew, MD 03/08/22 [phone]

## 2022-03-04 ENCOUNTER — Inpatient Hospital Stay (HOSPITAL_COMMUNITY)
Admission: EM | Admit: 2022-03-04 | Discharge: 2022-03-07 | DRG: 690 | Disposition: A | Discharge status: Home / Self Care | Payer: BC Managed Care – PPO | Attending: Internal Medicine | Admitting: Internal Medicine

## 2022-03-04 ENCOUNTER — Encounter (HOSPITAL_COMMUNITY): Payer: Self-pay | Admitting: Emergency Medicine

## 2022-03-04 DIAGNOSIS — Z20822 Contact with and (suspected) exposure to covid-19: Secondary | ICD-10-CM | POA: Diagnosis present

## 2022-03-04 DIAGNOSIS — E8809 Other disorders of plasma-protein metabolism, not elsewhere classified: Secondary | ICD-10-CM | POA: Diagnosis present

## 2022-03-04 DIAGNOSIS — Z7982 Long term (current) use of aspirin: Secondary | ICD-10-CM

## 2022-03-04 DIAGNOSIS — Z90710 Acquired absence of both cervix and uterus: Secondary | ICD-10-CM

## 2022-03-04 DIAGNOSIS — E441 Mild protein-calorie malnutrition: Secondary | ICD-10-CM | POA: Diagnosis present

## 2022-03-04 DIAGNOSIS — E782 Mixed hyperlipidemia: Secondary | ICD-10-CM | POA: Diagnosis present

## 2022-03-04 DIAGNOSIS — N10 Acute tubulo-interstitial nephritis: Principal | ICD-10-CM | POA: Diagnosis present

## 2022-03-04 DIAGNOSIS — Z794 Long term (current) use of insulin: Secondary | ICD-10-CM

## 2022-03-04 DIAGNOSIS — Z87891 Personal history of nicotine dependence: Secondary | ICD-10-CM

## 2022-03-04 DIAGNOSIS — Z9641 Presence of insulin pump (external) (internal): Secondary | ICD-10-CM | POA: Diagnosis present

## 2022-03-04 DIAGNOSIS — Z6835 Body mass index (BMI) 35.0-35.9, adult: Secondary | ICD-10-CM

## 2022-03-04 DIAGNOSIS — E46 Unspecified protein-calorie malnutrition: Secondary | ICD-10-CM

## 2022-03-04 DIAGNOSIS — Z833 Family history of diabetes mellitus: Secondary | ICD-10-CM

## 2022-03-04 DIAGNOSIS — K219 Gastro-esophageal reflux disease without esophagitis: Secondary | ICD-10-CM | POA: Diagnosis present

## 2022-03-04 DIAGNOSIS — E1165 Type 2 diabetes mellitus with hyperglycemia: Secondary | ICD-10-CM | POA: Diagnosis present

## 2022-03-04 DIAGNOSIS — N12 Tubulo-interstitial nephritis, not specified as acute or chronic: Principal | ICD-10-CM

## 2022-03-04 DIAGNOSIS — Z79899 Other long term (current) drug therapy: Secondary | ICD-10-CM

## 2022-03-04 DIAGNOSIS — Z8249 Family history of ischemic heart disease and other diseases of the circulatory system: Secondary | ICD-10-CM

## 2022-03-04 DIAGNOSIS — Z800 Family history of malignant neoplasm of digestive organs: Secondary | ICD-10-CM

## 2022-03-04 DIAGNOSIS — B9620 Unspecified Escherichia coli [E. coli] as the cause of diseases classified elsewhere: Secondary | ICD-10-CM | POA: Diagnosis present

## 2022-03-04 DIAGNOSIS — I10 Essential (primary) hypertension: Secondary | ICD-10-CM | POA: Diagnosis present

## 2022-03-04 LAB — CBC WITH DIFFERENTIAL/PLATELET
Abs Immature Granulocytes: 0.06 10*3/uL (ref 0.00–0.07)
Basophils Absolute: 0 10*3/uL (ref 0.0–0.1)
Basophils Relative: 0 %
Eosinophils Absolute: 0.2 10*3/uL (ref 0.0–0.5)
Eosinophils Relative: 1 %
HCT: 35.9 % — ABNORMAL LOW (ref 36.0–46.0)
Hemoglobin: 12.7 g/dL (ref 12.0–15.0)
Immature Granulocytes: 1 %
Lymphocytes Relative: 19 %
Lymphs Abs: 2.1 10*3/uL (ref 0.7–4.0)
MCH: 28.2 pg (ref 26.0–34.0)
MCHC: 35.4 g/dL (ref 30.0–36.0)
MCV: 79.8 fL — ABNORMAL LOW (ref 80.0–100.0)
Monocytes Absolute: 0.9 10*3/uL (ref 0.1–1.0)
Monocytes Relative: 8 %
Neutro Abs: 8.3 10*3/uL — ABNORMAL HIGH (ref 1.7–7.7)
Neutrophils Relative %: 71 %
Platelets: 249 10*3/uL (ref 150–400)
RBC: 4.5 MIL/uL (ref 3.87–5.11)
RDW: 14.5 % (ref 11.5–15.5)
WBC: 11.5 10*3/uL — ABNORMAL HIGH (ref 4.0–10.5)
nRBC: 0 % (ref 0.0–0.2)

## 2022-03-04 LAB — COMPREHENSIVE METABOLIC PANEL
ALT: 18 U/L (ref 0–44)
AST: 13 U/L — ABNORMAL LOW (ref 15–41)
Albumin: 3.1 g/dL — ABNORMAL LOW (ref 3.5–5.0)
Alkaline Phosphatase: 77 U/L (ref 38–126)
Anion gap: 7 (ref 5–15)
BUN: 11 mg/dL (ref 8–23)
CO2: 24 mmol/L (ref 22–32)
Calcium: 8.5 mg/dL — ABNORMAL LOW (ref 8.9–10.3)
Chloride: 104 mmol/L (ref 98–111)
Creatinine, Ser: 0.75 mg/dL (ref 0.44–1.00)
GFR, Estimated: >60 mL/min (ref >60)
Glucose, Bld: 189 mg/dL — ABNORMAL HIGH (ref 70–99)
Potassium: 4.1 mmol/L (ref 3.5–5.1)
Sodium: 135 mmol/L (ref 135–145)
Total Bilirubin: 0.4 mg/dL (ref 0.3–1.2)
Total Protein: 7.4 g/dL (ref 6.5–8.1)

## 2022-03-04 LAB — LACTIC ACID, PLASMA
Lactic Acid, Venous: 1.2 mmol/L (ref 0.5–1.9)
Lactic Acid, Venous: 1 mmol/L (ref 0.5–1.9)

## 2022-03-04 MED ORDER — SODIUM CHLORIDE 0.9 % IV SOLN
2.0000 g | Freq: Once | INTRAVENOUS | Status: AC
  Administered 2022-03-04: 2 g via INTRAVENOUS
  Filled 2022-03-04: qty 20

## 2022-03-04 MED ORDER — ONDANSETRON HCL 4 MG/2ML IJ SOLN
4.0000 mg | Freq: Once | INTRAMUSCULAR | Status: AC
  Administered 2022-03-04: 4 mg via INTRAVENOUS
  Filled 2022-03-04: qty 2

## 2022-03-04 MED ORDER — SODIUM CHLORIDE 0.9 % IV BOLUS
1000.0000 mL | Freq: Once | INTRAVENOUS | Status: AC
  Administered 2022-03-04: 1000 mL via INTRAVENOUS

## 2022-03-04 MED ORDER — IBUPROFEN 800 MG PO TABS
800.0000 mg | ORAL_TABLET | Freq: Once | ORAL | Status: AC
  Administered 2022-03-04: 800 mg via ORAL
  Filled 2022-03-04: qty 1

## 2022-03-04 MED ORDER — HYDROMORPHONE HCL 1 MG/ML IJ SOLN
0.5000 mg | Freq: Once | INTRAMUSCULAR | Status: AC
  Administered 2022-03-04: 0.5 mg via INTRAVENOUS
  Filled 2022-03-04: qty 0.5

## 2022-03-04 NOTE — ED Triage Notes (Signed)
Pt c/o continued right flank since being discharged yesterday with UTI. Pt states she has continued to have fever as well. Last tylenol 1700, last ibuprofen 1200.

## 2022-03-04 NOTE — ED Provider Notes (Signed)
[facility] Emergency Department Provider Note MRN:  [id number]  Arrival date & time: 03/05/22     Chief Complaint   Flank Pain   History of Present Illness   Sarah [name] is a 61 y.o. year-old female with a history of hypertension, diabetes presenting to the ED with chief complaint of flank pain.  Pain to the right flank for the past 2 or 3 days.  Was diagnosed with a UTI yesterday.  Has taken 4 doses of the Keflex but the symptoms are worsening.  Fever endorsed.  Review of Systems  A thorough review of systems was obtained and all systems are negative except as noted in the HPI and PMH.   Patient's Health History    Past Medical History:  Diagnosis Date   Anxiety    Diabetes mellitus    Diverticulosis    GERD (gastroesophageal reflux disease)    Hypercholesteremia    Hypertension     Past Surgical History:  Procedure Laterality Date   ABDOMINAL HYSTERECTOMY     complete   CARDIAC CATHETERIZATION  2011   COLONOSCOPY  01/04/2010   left sided transverse diverticula/two diminutive rectal polyps   COLONOSCOPY  03/28/2012   Surgeon: Daneil Dolin, MD; riable anal canal with suspected trivial rectal bleeding from this location, colonic diverticulosis, s/p segmental resection, single diminutive polyp in the descending colon resected.  Pathology was benign.  Recommended 10-year repeat.   COLONOSCOPY N/A 09/11/2018   Surgeon: Aviva Signs, MD;   moderate diverticulosis in the descending colon, otherwise normal exam.  Recommended 10-year repeat.   FOOT SURGERY     right-tendon repair   laparoscopic sigmoid colectomy  02/01/2010   Dr Jenkins-diverticulitis   TRIGGER FINGER RELEASE Left 03/2015   TRIGGER FINGER RELEASE Right 02/25/2016   Procedure: RIGHT LONG TRIGGER FINGER RELEASE;  Surgeon: Carole Civil, MD;  Location: [facility];  Service: Orthopedics;  Laterality: Right;   UMBILICAL HERNIA REPAIR N/A 02/05/2020   Procedure: UMBILICAL  HERNIORRHAPY WITH MESH;  Surgeon: Aviva Signs, MD;  Location: [facility];  Service: General;  Laterality: N/A;    Family History  Problem Relation Age of Onset   Colon polyps Mother        in her 56s   Diabetes Mother    Hypertension Mother    Heart attack Father    Heart murmur Sister    Hypertension Brother    Colon cancer Maternal Grandfather        57   Diabetes Son    Liver disease Neg Hx     Social History   Socioeconomic History   Marital status: Married    Spouse name: Not on file   Number of children: 1   Years of education: Not on file   Highest education level: Not on file  Occupational History   Occupation: K Pharmacist, hospital asst    Employer: [employer]  Tobacco Use   Smoking status: Former    Packs/day: 1.00    Years: 10.00    Total pack years: 10.00    Types: Cigarettes    Quit date: 09/05/2006    Years since quitting: 15.5   Smokeless tobacco: Never  Vaping Use   Vaping Use: Never used  Substance and Sexual Activity   Alcohol use: Yes    Comment: occasional   Drug use: No   Sexual activity: Yes    Birth control/protection: Surgical    Comment: hyst  Other Topics Concern   Not  on file  Social History Narrative   Lives w/ husband, sister & her children , teen mom & her baby   Social Determinants of Health   Financial Resource Strain: Not on file  Food Insecurity: Not on file  Transportation Needs: Not on file  Physical Activity: Not on file  Stress: Not on file  Social Connections: Not on file  Intimate Partner Violence: Not on file     Physical Exam   Vitals:   03/04/22 2345 03/05/22 0151  BP: (!) 129/56 119/61  Pulse: 82 68  Resp: 17 18  Temp:    SpO2: 93% 92%    CONSTITUTIONAL: Well-appearing, in moderate distress due to pain NEURO/PSYCH:  Alert and oriented x 3, no focal deficits EYES:  eyes equal and reactive ENT/NECK:  no LAD, no JVD CARDIO: Regular rate, well-perfused, normal S1 and S2 PULM:  CTAB no wheezing or  rhonchi GI/GU:  non-distended, non-tender MSK/SPINE:  No gross deformities, no edema SKIN:  no rash, atraumatic   *Additional and/or pertinent findings included in MDM below  Diagnostic and Interventional Summary    EKG Interpretation  Date/Time:    Ventricular Rate:    PR Interval:    QRS Duration:   QT Interval:    QTC Calculation:   R Axis:     Text Interpretation:         Labs Reviewed  CBC WITH DIFFERENTIAL/PLATELET - Abnormal; Notable for the following components:      Result Value   WBC 11.5 (*)    HCT 35.9 (*)    MCV 79.8 (*)    Neutro Abs 8.3 (*)    All other components within normal limits  COMPREHENSIVE METABOLIC PANEL - Abnormal; Notable for the following components:   Glucose, Bld 189 (*)    Calcium 8.5 (*)    Albumin 3.1 (*)    AST 13 (*)    All other components within normal limits  LACTIC ACID, PLASMA  LACTIC ACID, PLASMA    No orders to display    Medications  ibuprofen (ADVIL) tablet 800 mg (800 mg Oral Given 03/04/22 2020)  sodium chloride 0.9 % bolus 1,000 mL (0 mLs Intravenous Stopped 03/05/22 0101)  cefTRIAXone (ROCEPHIN) 2 g in sodium chloride 0.9 % 100 mL IVPB (0 g Intravenous Stopped 03/04/22 2357)  ondansetron (ZOFRAN) injection 4 mg (4 mg Intravenous Given 03/04/22 2328)  HYDROmorphone (DILAUDID) injection 0.5 mg (0.5 mg Intravenous Given 03/04/22 2327)  diphenhydrAMINE (BENADRYL) injection 25 mg (25 mg Intravenous Given 03/05/22 0017)     Procedures  /  Critical Care Procedures  ED Course and Medical Decision Making  Initial Impression and Ddx History suspicious for pyelonephritis.  Kidney stone considered but CT scan obtained yesterday is without evidence of kidney stone, normal appendix, and there is CT evidence of likely pyelonephritis.  Abdomen is soft and nontender, fever on arrival at 102 but otherwise reassuring vital signs.  Providing fluids, ceftriaxone, symptomatic management and will reassess.  Past medical/surgical history  that increases complexity of ED encounter: None  Interpretation of Diagnostics I personally reviewed the laboratory evaluation and my interpretation is as follows: No significant blood count or electrolyte disturbance, minimal leukocytosis    Patient Reassessment and Ultimate Disposition/Management     Patient still feeling generally unwell and is worried about going home, will admit to medicine.  Patient management required discussion with the following services or consulting groups:  Hospitalist Service  Complexity of Problems Addressed Acute illness or injury that poses  threat of life of bodily function  Additional Data Reviewed and Analyzed Further history obtained from: Further history from spouse/family member  Additional Factors Impacting ED Encounter Risk Consideration of hospitalization  Barth Kirks. Sedonia Small, MD [place] [email or web address]  Final Clinical Impressions(s) / ED Diagnoses     ICD-10-CM   1. Pyelonephritis  N12       ED Discharge Orders     None        Discharge Instructions Discussed with and Provided to Patient:   Discharge Instructions   None      Maudie Flakes, MD 03/05/22 [phone]

## 2022-03-05 ENCOUNTER — Other Ambulatory Visit: Payer: Self-pay

## 2022-03-05 DIAGNOSIS — E8809 Other disorders of plasma-protein metabolism, not elsewhere classified: Secondary | ICD-10-CM

## 2022-03-05 DIAGNOSIS — Z833 Family history of diabetes mellitus: Secondary | ICD-10-CM | POA: Diagnosis not present

## 2022-03-05 DIAGNOSIS — K219 Gastro-esophageal reflux disease without esophagitis: Secondary | ICD-10-CM | POA: Diagnosis present

## 2022-03-05 DIAGNOSIS — N10 Acute tubulo-interstitial nephritis: Secondary | ICD-10-CM | POA: Diagnosis present

## 2022-03-05 DIAGNOSIS — E782 Mixed hyperlipidemia: Secondary | ICD-10-CM

## 2022-03-05 DIAGNOSIS — E0865 Diabetes mellitus due to underlying condition with hyperglycemia: Secondary | ICD-10-CM

## 2022-03-05 DIAGNOSIS — I10 Essential (primary) hypertension: Secondary | ICD-10-CM

## 2022-03-05 DIAGNOSIS — E441 Mild protein-calorie malnutrition: Secondary | ICD-10-CM | POA: Diagnosis present

## 2022-03-05 DIAGNOSIS — Z8249 Family history of ischemic heart disease and other diseases of the circulatory system: Secondary | ICD-10-CM | POA: Diagnosis not present

## 2022-03-05 DIAGNOSIS — Z794 Long term (current) use of insulin: Secondary | ICD-10-CM

## 2022-03-05 DIAGNOSIS — B9620 Unspecified Escherichia coli [E. coli] as the cause of diseases classified elsewhere: Secondary | ICD-10-CM | POA: Diagnosis present

## 2022-03-05 DIAGNOSIS — Z7982 Long term (current) use of aspirin: Secondary | ICD-10-CM | POA: Diagnosis not present

## 2022-03-05 DIAGNOSIS — Z9641 Presence of insulin pump (external) (internal): Secondary | ICD-10-CM | POA: Diagnosis present

## 2022-03-05 DIAGNOSIS — Z87891 Personal history of nicotine dependence: Secondary | ICD-10-CM | POA: Diagnosis not present

## 2022-03-05 DIAGNOSIS — Z20822 Contact with and (suspected) exposure to covid-19: Secondary | ICD-10-CM | POA: Diagnosis present

## 2022-03-05 DIAGNOSIS — Z90710 Acquired absence of both cervix and uterus: Secondary | ICD-10-CM | POA: Diagnosis not present

## 2022-03-05 DIAGNOSIS — Z800 Family history of malignant neoplasm of digestive organs: Secondary | ICD-10-CM | POA: Diagnosis not present

## 2022-03-05 DIAGNOSIS — Z6835 Body mass index (BMI) 35.0-35.9, adult: Secondary | ICD-10-CM | POA: Diagnosis not present

## 2022-03-05 DIAGNOSIS — E46 Unspecified protein-calorie malnutrition: Secondary | ICD-10-CM

## 2022-03-05 DIAGNOSIS — Z79899 Other long term (current) drug therapy: Secondary | ICD-10-CM | POA: Diagnosis not present

## 2022-03-05 DIAGNOSIS — E1165 Type 2 diabetes mellitus with hyperglycemia: Secondary | ICD-10-CM | POA: Diagnosis present

## 2022-03-05 LAB — GLUCOSE, CAPILLARY
Glucose-Capillary: 127 mg/dL — ABNORMAL HIGH (ref 70–99)
Glucose-Capillary: 224 mg/dL — ABNORMAL HIGH (ref 70–99)
Glucose-Capillary: 164 mg/dL — ABNORMAL HIGH (ref 70–99)
Glucose-Capillary: 262 mg/dL — ABNORMAL HIGH (ref 70–99)

## 2022-03-05 LAB — CBC
HCT: 32.3 % — ABNORMAL LOW (ref 36.0–46.0)
Hemoglobin: 11.2 g/dL — ABNORMAL LOW (ref 12.0–15.0)
MCH: 28 pg (ref 26.0–34.0)
MCHC: 34.7 g/dL (ref 30.0–36.0)
MCV: 80.8 fL (ref 80.0–100.0)
Platelets: 228 10*3/uL (ref 150–400)
RBC: 4 MIL/uL (ref 3.87–5.11)
RDW: 14.4 % (ref 11.5–15.5)
WBC: 9.7 10*3/uL (ref 4.0–10.5)
nRBC: 0 % (ref 0.0–0.2)

## 2022-03-05 LAB — COMPREHENSIVE METABOLIC PANEL
ALT: 15 U/L (ref 0–44)
AST: 13 U/L — ABNORMAL LOW (ref 15–41)
Albumin: 2.7 g/dL — ABNORMAL LOW (ref 3.5–5.0)
Alkaline Phosphatase: 61 U/L (ref 38–126)
Anion gap: 5 (ref 5–15)
BUN: 12 mg/dL (ref 8–23)
CO2: 27 mmol/L (ref 22–32)
Calcium: 8.2 mg/dL — ABNORMAL LOW (ref 8.9–10.3)
Chloride: 108 mmol/L (ref 98–111)
Creatinine, Ser: 0.69 mg/dL (ref 0.44–1.00)
GFR, Estimated: >60 mL/min (ref >60)
Glucose, Bld: 121 mg/dL — ABNORMAL HIGH (ref 70–99)
Potassium: 3.9 mmol/L (ref 3.5–5.1)
Sodium: 140 mmol/L (ref 135–145)
Total Bilirubin: 0.8 mg/dL (ref 0.3–1.2)
Total Protein: 6.7 g/dL (ref 6.5–8.1)

## 2022-03-05 LAB — HIV ANTIBODY (ROUTINE TESTING W REFLEX): HIV Screen 4th Generation wRfx: NONREACTIVE

## 2022-03-05 LAB — PHOSPHORUS: Phosphorus: 4 mg/dL (ref 2.5–4.6)

## 2022-03-05 LAB — URINE CULTURE: Culture: >=100000 — AB

## 2022-03-05 LAB — MAGNESIUM: Magnesium: 1.9 mg/dL (ref 1.7–2.4)

## 2022-03-05 MED ORDER — DIPHENHYDRAMINE HCL 50 MG/ML IJ SOLN
25.0000 mg | Freq: Once | INTRAMUSCULAR | Status: AC
  Administered 2022-03-05: 25 mg via INTRAVENOUS
  Filled 2022-03-05: qty 1

## 2022-03-05 MED ORDER — ASPIRIN 81 MG PO TBEC
81.0000 mg | DELAYED_RELEASE_TABLET | Freq: Every day | ORAL | Status: DC
  Administered 2022-03-05 – 2022-03-07 (×3): 81 mg via ORAL
  Filled 2022-03-05 (×3): qty 1

## 2022-03-05 MED ORDER — METOPROLOL TARTRATE 50 MG PO TABS
50.0000 mg | ORAL_TABLET | Freq: Two times a day (BID) | ORAL | Status: DC
  Administered 2022-03-05 – 2022-03-07 (×4): 50 mg via ORAL
  Filled 2022-03-05 (×4): qty 1

## 2022-03-05 MED ORDER — LINACLOTIDE 145 MCG PO CAPS
145.0000 ug | ORAL_CAPSULE | Freq: Every day | ORAL | Status: DC
  Administered 2022-03-06: 145 ug via ORAL
  Filled 2022-03-05 (×2): qty 1

## 2022-03-05 MED ORDER — DIPHENHYDRAMINE HCL 50 MG/ML IJ SOLN
25.0000 mg | Freq: Four times a day (QID) | INTRAMUSCULAR | Status: DC | PRN
Start: 2022-03-05 — End: 2022-03-07
  Administered 2022-03-05: 25 mg via INTRAVENOUS
  Filled 2022-03-05: qty 1

## 2022-03-05 MED ORDER — ACETAMINOPHEN 325 MG PO TABS
650.0000 mg | ORAL_TABLET | Freq: Four times a day (QID) | ORAL | Status: DC | PRN
  Administered 2022-03-05 – 2022-03-07 (×4): 650 mg via ORAL
  Filled 2022-03-05 (×4): qty 2

## 2022-03-05 MED ORDER — KETOROLAC TROMETHAMINE 15 MG/ML IJ SOLN
15.0000 mg | Freq: Once | INTRAMUSCULAR | Status: AC
  Administered 2022-03-05: 15 mg via INTRAVENOUS
  Filled 2022-03-05: qty 1

## 2022-03-05 MED ORDER — KETOROLAC TROMETHAMINE 30 MG/ML IJ SOLN
30.0000 mg | Freq: Three times a day (TID) | INTRAMUSCULAR | Status: DC | PRN
Start: 2022-03-05 — End: 2022-03-07
  Administered 2022-03-05 – 2022-03-06 (×3): 30 mg via INTRAVENOUS
  Filled 2022-03-05 (×3): qty 1

## 2022-03-05 MED ORDER — MORPHINE SULFATE (PF) 2 MG/ML IV SOLN
2.0000 mg | INTRAVENOUS | Status: DC | PRN
  Administered 2022-03-05 (×3): 2 mg via INTRAVENOUS
  Filled 2022-03-05 (×3): qty 1

## 2022-03-05 MED ORDER — CEFAZOLIN SODIUM-DEXTROSE 1-4 GM/50ML-% IV SOLN
1.0000 g | Freq: Three times a day (TID) | INTRAVENOUS | Status: DC
  Administered 2022-03-05 – 2022-03-07 (×6): 1 g via INTRAVENOUS
  Filled 2022-03-05 (×10): qty 50

## 2022-03-05 MED ORDER — SIMVASTATIN 20 MG PO TABS: 20.0000 mg | ORAL_TABLET | ORAL | Status: DC

## 2022-03-05 MED ORDER — ENOXAPARIN SODIUM 40 MG/0.4ML IJ SOSY
40.0000 mg | PREFILLED_SYRINGE | Freq: Every day | INTRAMUSCULAR | Status: DC
  Administered 2022-03-05 – 2022-03-07 (×3): 40 mg via SUBCUTANEOUS
  Filled 2022-03-05 (×3): qty 0.4

## 2022-03-05 MED ORDER — GLUCERNA SHAKE PO LIQD
237.0000 mL | Freq: Three times a day (TID) | ORAL | Status: DC
  Administered 2022-03-05 – 2022-03-07 (×4): 237 mL via ORAL

## 2022-03-05 MED ORDER — SODIUM CHLORIDE 0.9 % IV SOLN: INTRAVENOUS | Status: AC

## 2022-03-05 MED ORDER — LORAZEPAM 0.5 MG PO TABS
0.5000 mg | ORAL_TABLET | Freq: Two times a day (BID) | ORAL | Status: DC | PRN
Start: 2022-03-05 — End: 2022-03-07
  Administered 2022-03-05 – 2022-03-06 (×2): 0.5 mg via ORAL
  Filled 2022-03-05 (×2): qty 1

## 2022-03-05 NOTE — Progress Notes (Signed)
Patient insulin contract signed and in patient chart.

## 2022-03-05 NOTE — Hospital Course (Signed)
61 y.o. female with medical history significant of T2DM, hypertension, hyperlipidemia who presents to the emergency due to worsening right flank pain.  Patient presented to the emergency department on 7/13 due to abdominal pain, urinary frequency and painful urination, she was diagnosed to have UTI, antibiotic was prescribed and patient was discharged home.  She states that she has taken about 4 doses of Keflex prescribed, but continued to complain of worsening right lower back/right groin pain which was accompanied with fever as high as 105F and chills at home,  so she returned to the ED for further evaluation and management

## 2022-03-05 NOTE — H&P (Signed)
History and Physical    Patient: Sarah [name] VFI:[id number] DOB: 1961-01-29 DOA: 03/04/2022 DOS: the patient was seen and examined on 03/05/2022 PCP: Celene Squibb, MD  Patient coming from: Home  Chief Complaint:  Chief Complaint  Patient presents with   Flank Pain   HPI: Sarah [name] is a 61 y.o. female with medical history significant of T2DM, hypertension, hyperlipidemia who presents to the emergency due to worsening right flank pain.  Patient presented to the emergency department on 7/13 due to abdominal pain, urinary frequency and painful urination, she was diagnosed to have UTI, antibiotic was prescribed and patient was discharged home.  She states that she has taken about 4 doses of Keflex prescribed, but continued to complain of worsening right lower back/right groin pain which was accompanied with fever as high as 105F and chills at home,  so she returned to the ED for further evaluation and management.  ED Course:  In the emergency department, temperature on arrival was 102F, BP was soft at 118/62, but other vital signs were within normal range.  Work-up in the ED showed normal CBC except for WBC at 11.5 and normal BMP except for hyperglycemia, albumin 3.1, lactic acid x2 was negative.  Urine culture was positive for E. coli..  Patient was started on IV Septra, IV Dilaudid was given due to pain, ibuprofen and Toradol was given due to fever.  Zofran was given due to nausea and vomiting, IV hydration was provided.  Hospitalist was asked to admit patient for further evaluation and management  Review of Systems: Review of systems as noted in the HPI. All other systems reviewed and are negative.   Past Medical History:  Diagnosis Date   Anxiety    Diabetes mellitus    Diverticulosis    GERD (gastroesophageal reflux disease)    Hypercholesteremia    Hypertension    Past Surgical History:  Procedure Laterality Date   ABDOMINAL HYSTERECTOMY     complete   CARDIAC  CATHETERIZATION  2011   COLONOSCOPY  01/04/2010   left sided transverse diverticula/two diminutive rectal polyps   COLONOSCOPY  03/28/2012   Surgeon: Daneil Dolin, MD; riable anal canal with suspected trivial rectal bleeding from this location, colonic diverticulosis, s/p segmental resection, single diminutive polyp in the descending colon resected.  Pathology was benign.  Recommended 10-year repeat.   COLONOSCOPY N/A 09/11/2018   Surgeon: Aviva Signs, MD;   moderate diverticulosis in the descending colon, otherwise normal exam.  Recommended 10-year repeat.   FOOT SURGERY     right-tendon repair   laparoscopic sigmoid colectomy  02/01/2010   Dr Jenkins-diverticulitis   TRIGGER FINGER RELEASE Left 03/2015   TRIGGER FINGER RELEASE Right 02/25/2016   Procedure: RIGHT LONG TRIGGER FINGER RELEASE;  Surgeon: Carole Civil, MD;  Location: [facility];  Service: Orthopedics;  Laterality: Right;   UMBILICAL HERNIA REPAIR N/A 02/05/2020   Procedure: UMBILICAL HERNIORRHAPY WITH MESH;  Surgeon: Aviva Signs, MD;  Location: [facility];  Service: General;  Laterality: N/A;    Social History:  reports that she quit smoking about 15 years ago. Her smoking use included cigarettes. She has a 10.00 pack-year smoking history. She has never used smokeless tobacco. She reports current alcohol use. She reports that she does not use drugs.   Allergies  Allergen Reactions   Sulfonamide Derivatives Itching   Latex Itching, Rash and Other (See Comments)    Powdered only    Family History  Problem Relation Age of Onset  Colon polyps Mother        in her 35s   Diabetes Mother    Hypertension Mother    Heart attack Father    Heart murmur Sister    Hypertension Brother    Colon cancer Maternal Grandfather        68   Diabetes Son    Liver disease Neg Hx      Prior to Admission medications   Medication Sig Start Date End Date Taking? Authorizing Provider  aspirin EC 81 MG tablet Take 81 mg by mouth  daily.    [provider]  cephALEXin (KEFLEX) 500 MG capsule Take 1 capsule (500 mg total) by mouth 4 (four) times daily. 03/03/22   Hayden Rasmussen, MD  Continuous Blood Gluc Receiver ([place]) [place] by Does not apply route.    [provider]  diclofenac Sodium (VOLTAREN) 1 % GEL Apply 2 g topically 4 (four) times daily. 01/27/21   Carole Civil, MD  escitalopram (LEXAPRO) 5 MG tablet Take 5 mg by mouth at bedtime.  07/22/19   [provider]  ibuprofen (ADVIL) 800 MG tablet Take 800 mg by mouth every 6 (six) hours as needed for moderate pain.    [provider]  insulin aspart (NOVOLOG) 100 UNIT/ML injection 110u per day via insulin pump 06/22/21   Renato Shin, MD  Insulin Infusion Pump (T: SLIM X2 INS PMP/CONTROL 7.4) DEVI 1 Device by Does not apply route every other day. This prescription is for T:slimX2 76m cartridges. 07/07/21   ERenato Shin MD  linaclotide ([facility] 145 MCG CAPS capsule Take 1 capsule (145 mcg total) by mouth daily before breakfast. 12/27/21   HErenest Rasher PA-C  LORazepam (ATIVAN) 0.5 MG tablet Take 0.5 mg by mouth 2 (two) times daily as needed for anxiety. 11/28/19   [provider]  losartan (COZAAR) 25 MG tablet Take 25 mg by mouth daily.    [provider]  Magnesium 400 MG TABS Take 400 mg by mouth daily.    [provider]  meloxicam (MOBIC) 7.5 MG tablet Take 1 tablet (7.5 mg total) by mouth daily. 12/10/21   HCarole Civil MD  metoprolol tartrate (LOPRESSOR) 50 MG tablet Take 1 tablet (50 mg total) by mouth 2 (two) times daily. Patient taking differently: Take 50 mg by mouth 2 (two) times daily. Still taking metoprolol 07/26/19 06/16/21  KHerminio Commons MD  Multiple Vitamins-Minerals (MULTIVITAMIN PO) Take 1 tablet by mouth daily.    [provider]  ondansetron (ZOFRAN) 4 MG tablet Take 1 tablet (4 mg total) by mouth every 8 (eight) hours as needed for nausea or  vomiting. 03/03/22   BHayden Rasmussen MD  Semaglutide, 1 MG/DOSE, 4 MG/3ML SOPN Inject 1 mg as directed once a week. 06/15/21   ERenato Shin MD  simvastatin (ZOCOR) 20 MG tablet Take 20 mg by mouth every Monday, Wednesday, and Friday.  10/19/19   [provider]    Physical Exam: BP 112/63 (BP Location: Left Arm)   Pulse 72   Temp 98 F (36.7 C) (Oral)   Resp 19   Ht '5\' 3"'$  (1.6 m)   Wt 92.1 kg   SpO2 96%   BMI 35.97 kg/m   General: 61y.o. year-old female ill appearing, but in no acute distress.  Alert and oriented x3. HEENT: NCAT, EOMI Neck: Supple, trachea medial Cardiovascular: Regular rate and rhythm with no rubs or gallops.  No thyromegaly or JVD  noted.  No lower extremity edema. 2/4 pulses in all 4 extremities. Respiratory: Clear to auscultation with no wheezes or rales. Good inspiratory effort. Abdomen: Soft, tender to palpation of right lower back and right groin area.  Nondistended with normal bowel sounds x4 quadrants. Muskuloskeletal: No cyanosis, clubbing or edema noted bilaterally Neuro: CN II-XII intact, strength 5/5 x 4, sensation, reflexes intact Skin: No ulcerative lesions noted or rashes Psychiatry: Judgement and insight appear normal. Mood is appropriate for condition and setting          Labs on Admission:  Basic Metabolic Panel: Recent Labs  Lab 03/03/22 0345 03/04/22 2045  NA 138 135  K 3.8 4.1  CL 102 104  CO2 27 24  GLUCOSE 174* 189*  BUN 21 11  CREATININE 0.75 0.75  CALCIUM 9.1 8.5*   Liver Function Tests: Recent Labs  Lab 03/03/22 0345 03/04/22 2045  AST 17 13*  ALT 27 18  ALKPHOS 91 77  BILITOT 0.9 0.4  PROT 8.1 7.4  ALBUMIN 3.6 3.1*   No results for input(s): "LIPASE", "AMYLASE" in the last 168 hours. No results for input(s): "AMMONIA" in the last 168 hours. CBC: Recent Labs  Lab 03/03/22 0345 03/04/22 2045  WBC 7.8 11.5*  NEUTROABS 4.2 8.3*  HGB 14.3 12.7  HCT 40.1 35.9*  MCV 80.7 79.8*  PLT 277 249    Cardiac Enzymes: No results for input(s): "CKTOTAL", "CKMB", "CKMBINDEX", "TROPONINI" in the last 168 hours.  BNP (last 3 results) No results for input(s): "BNP" in the last 8760 hours.  ProBNP (last 3 results) No results for input(s): "PROBNP" in the last 8760 hours.  CBG: No results for input(s): "GLUCAP" in the last 168 hours.  Radiological Exams on Admission: No results found.  EKG: I independently viewed the EKG done and my findings are as followed: Normal sinus rhythm at a rate of 98 bpm  Assessment/Plan Present on Admission:  Acute pyelonephritis  Essential hypertension  Principal Problem:   Acute pyelonephritis Active Problems:   Mixed hyperlipidemia   Diabetes mellitus with hyperglycemia (HCC)   Essential hypertension   Hypoalbuminemia due to protein-calorie malnutrition (HCC)  Acute pyelonephritis Elevated WBCs secondary to above Patient was started on IV ceftriaxone, we shall continue with same at this time pending the specificity of the urine.  Urine culture was positive for E. coli Continue Tylenol as needed for fever Continue IV hydration  T2DM with hyperglycemia CBG 189, continue insulin pump Continue to monitor CBG  Hypoalbuminemia possibly secondary to mild protein calorie malnutrition Albumin 3.1, continue protein supplements  Essential hypertension BP meds will be held at this time due to soft BP  Mixed hyperlipidemia Continue Zocor  DVT prophylaxis: Lovenox  Code Status: Full code  Consults: None  Family Communication: Husband at bedside (all questions answered to satisfaction)  Severity of Illness: The appropriate patient status for this patient is INPATIENT. Inpatient status is judged to be reasonable and necessary in order to provide the required intensity of service to ensure the patient's safety. The patient's presenting symptoms, physical exam findings, and initial radiographic and laboratory data in the context of their chronic  comorbidities is felt to place them at high risk for further clinical deterioration. Furthermore, it is not anticipated that the patient will be medically stable for discharge from the hospital within 2 midnights of admission.   * I certify that at the point of admission it is my clinical judgment that the patient will require inpatient hospital care spanning beyond 2  midnights from the point of admission due to high intensity of service, high risk for further deterioration and high frequency of surveillance required.*  Author: Bernadette Hoit, DO 03/05/2022 7:37 AM  For on call review [email or web address].

## 2022-03-05 NOTE — Progress Notes (Signed)
ANTICOAGULATION CONSULT NOTE - Initial Consult  Pharmacy Consult for Lovenox Indication: VTE prophylaxis  Allergies  Allergen Reactions   Sulfonamide Derivatives Itching   Latex Itching, Rash and Other (See Comments)    Powdered only    Patient Measurements: Height: '5\' 3"'$  (160 cm) Weight: 92.1 kg (203 lb 0.7 oz) IBW/kg (Calculated) : 52.4  Vital Signs: Temp: 98 F (36.7 C) (07/15 0548) Temp Source: Oral (07/15 0548) BP: 112/63 (07/15 0548) Pulse Rate: 72 (07/15 0548)  Labs: Recent Labs    03/03/22 0345 03/03/22 0440 03/03/22 0648 03/04/22 2045  HGB 14.3  --   --  12.7  HCT 40.1  --   --  35.9*  PLT 277  --   --  249  APTT 30  --   --   --   LABPROT 13.4  --   --   --   INR 1.0  --   --   --   CREATININE 0.75  --   --  0.75  TROPONINIHS  --  9 8  --     Estimated Creatinine Clearance: 77.6 mL/min (by C-G formula based on SCr of 0.75 mg/dL).   Medical History: Past Medical History:  Diagnosis Date   Anxiety    Diabetes mellitus    Diverticulosis    GERD (gastroesophageal reflux disease)    Hypercholesteremia    Hypertension     Medications:  Medications Prior to Admission  Medication Sig Dispense Refill Last Dose   aspirin EC 81 MG tablet Take 81 mg by mouth daily.      cephALEXin (KEFLEX) 500 MG capsule Take 1 capsule (500 mg total) by mouth 4 (four) times daily. 28 capsule 0    Continuous Blood Gluc Receiver ([place]) DEVI by Does not apply route.      diclofenac Sodium (VOLTAREN) 1 % GEL Apply 2 g topically 4 (four) times daily. 2 g 5    escitalopram (LEXAPRO) 5 MG tablet Take 5 mg by mouth at bedtime.       ibuprofen (ADVIL) 800 MG tablet Take 800 mg by mouth every 6 (six) hours as needed for moderate pain.      insulin aspart (NOVOLOG) 100 UNIT/ML injection 110u per day via insulin pump 3.4 mL 3    Insulin Infusion Pump (T: SLIM X2 INS PMP/CONTROL 7.4) DEVI 1 Device by Does not apply route every other day. This prescription is for  T:slimX2 10m cartridges. 45 each 3    linaclotide (LINZESS) 145 MCG CAPS capsule Take 1 capsule (145 mcg total) by mouth daily before breakfast. 90 capsule 1    LORazepam (ATIVAN) 0.5 MG tablet Take 0.5 mg by mouth 2 (two) times daily as needed for anxiety.      losartan (COZAAR) 25 MG tablet Take 25 mg by mouth daily.      Magnesium 400 MG TABS Take 400 mg by mouth daily.      meloxicam (MOBIC) 7.5 MG tablet Take 1 tablet (7.5 mg total) by mouth daily. 30 tablet 5    metoprolol tartrate (LOPRESSOR) 50 MG tablet Take 1 tablet (50 mg total) by mouth 2 (two) times daily. (Patient taking differently: Take 50 mg by mouth 2 (two) times daily. Still taking metoprolol) 180 tablet 3    Multiple Vitamins-Minerals (MULTIVITAMIN PO) Take 1 tablet by mouth daily.      ondansetron (ZOFRAN) 4 MG tablet Take 1 tablet (4 mg total) by mouth every 8 (eight) hours as needed for nausea  or vomiting. 15 tablet 0    Semaglutide, 1 MG/DOSE, 4 MG/3ML SOPN Inject 1 mg as directed once a week. 9 mL 3    simvastatin (ZOCOR) 20 MG tablet Take 20 mg by mouth every Monday, Wednesday, and Friday.        Assessment: Sarah [name] is a 61 y.o. female with medical history significant of T2DM, hypertension, hyperlipidemia who presents to the emergency due to worsening right flank pain. Lovenox '40mg'$  SQ q24hrs for VTE prophylaxis ordered.      Goal of Therapy:  Adequate VTE prophylaxis per dosing recommendations Monitor platelets by anticoagulation protocol: Yes   Plan:  Lovenox '40mg'$  SQ q24hrs Monitor CBC, s/sx bleeding complications  Hart Robinsons A 03/05/2022,8:02 AM

## 2022-03-05 NOTE — Progress Notes (Signed)
  Progress Note   Patient: Sarah [name] YPP:[id number] DOB: 04-24-1961 DOA: 03/04/2022     0 DOS: the patient was seen and examined on 03/05/2022   Brief hospital course: 61 y.o. female with medical history significant of T2DM, hypertension, hyperlipidemia who presents to the emergency due to worsening right flank pain.  Patient presented to the emergency department on 7/13 due to abdominal pain, urinary frequency and painful urination, she was diagnosed to have UTI, antibiotic was prescribed and patient was discharged home.  She states that she has taken about 4 doses of Keflex prescribed, but continued to complain of worsening right lower back/right groin pain which was accompanied with fever as high as 105F and chills at home,  so she returned to the ED for further evaluation and management  Assessment and Plan: Acute pyelonephritis Elevated WBCs secondary to above - Patient was started on IV ceftriaxone, - Urine culture was positive for E. coli - Blood cx neg thus far - This am pt remains nauseated with abd discomfort. Having some difficult tolerating diet as a result. Overall improving    T2DM with hyperglycemia CBG 189, continue insulin pump Continue to monitor CBG  Hypoalbuminemia possibly secondary to mild protein calorie malnutrition Albumin 3.1, continue protein supplements  Essential hypertension -BP remains stable and well controlled -Cont off bp meds for now. BP meds were initially held due to soft BP at presentation  Mixed hyperlipidemia Continue Zocor         Subjective: Feeling better today. Still having some nausea and abd discomfort however  Physical Exam: Vitals:   03/05/22 0400 03/05/22 0548 03/05/22 1000 03/05/22 1254  BP: 119/60 112/63 127/63 128/68  Pulse: 82 72 78 83  Resp:  '19 16 17  '$ Temp: 98.9 F (37.2 C) 98 F (36.7 C) 98.4 F (36.9 C) 98.4 F (36.9 C)  TempSrc:  Oral Oral   SpO2: 94% 96% 98% 94%  Weight: 92.1 kg     Height: '5\' 3"'$  (1.6 m)       General exam: Awake, laying in bed, in nad Respiratory system: Normal respiratory effort, no wheezing Cardiovascular system: regular rate, s1, s2 Gastrointestinal system: Soft, nondistended, positive BS Central nervous system: CN2-12 grossly intact, strength intact Extremities: Perfused, no clubbing Skin: Normal skin turgor, no notable skin lesions seen Psychiatry: Mood normal // no visual hallucinations   Data Reviewed:  Labs reviewed: Na 140, Cr 0.69, WBC 9.7  Family Communication: Pt in room, family not at bedside  Disposition: Status is: Inpatient Remains inpatient appropriate because: Severity of illness  Planned Discharge Destination: Home    Author: Marylu Lund, MD 03/05/2022 3:28 PM  For on call review [email or web address].

## 2022-03-06 ENCOUNTER — Telehealth: Payer: Self-pay

## 2022-03-06 DIAGNOSIS — N10 Acute tubulo-interstitial nephritis: Secondary | ICD-10-CM | POA: Diagnosis not present

## 2022-03-06 LAB — CBC
HCT: 33.1 % — ABNORMAL LOW (ref 36.0–46.0)
Hemoglobin: 11.8 g/dL — ABNORMAL LOW (ref 12.0–15.0)
MCH: 28.6 pg (ref 26.0–34.0)
MCHC: 35.6 g/dL (ref 30.0–36.0)
MCV: 80.1 fL (ref 80.0–100.0)
Platelets: 262 10*3/uL (ref 150–400)
RBC: 4.13 MIL/uL (ref 3.87–5.11)
RDW: 14.4 % (ref 11.5–15.5)
WBC: 8.5 10*3/uL (ref 4.0–10.5)
nRBC: 0 % (ref 0.0–0.2)

## 2022-03-06 LAB — COMPREHENSIVE METABOLIC PANEL
ALT: 20 U/L (ref 0–44)
AST: 26 U/L (ref 15–41)
Albumin: 2.7 g/dL — ABNORMAL LOW (ref 3.5–5.0)
Alkaline Phosphatase: 77 U/L (ref 38–126)
Anion gap: 7 (ref 5–15)
BUN: 11 mg/dL (ref 8–23)
CO2: 26 mmol/L (ref 22–32)
Calcium: 8.7 mg/dL — ABNORMAL LOW (ref 8.9–10.3)
Chloride: 106 mmol/L (ref 98–111)
Creatinine, Ser: 0.71 mg/dL (ref 0.44–1.00)
GFR, Estimated: >60 mL/min (ref >60)
Glucose, Bld: 142 mg/dL — ABNORMAL HIGH (ref 70–99)
Potassium: 3.9 mmol/L (ref 3.5–5.1)
Sodium: 139 mmol/L (ref 135–145)
Total Bilirubin: 0.5 mg/dL (ref 0.3–1.2)
Total Protein: 6.9 g/dL (ref 6.5–8.1)

## 2022-03-06 LAB — GLUCOSE, CAPILLARY
Glucose-Capillary: 116 mg/dL — ABNORMAL HIGH (ref 70–99)
Glucose-Capillary: 145 mg/dL — ABNORMAL HIGH (ref 70–99)
Glucose-Capillary: 150 mg/dL — ABNORMAL HIGH (ref 70–99)
Glucose-Capillary: 173 mg/dL — ABNORMAL HIGH (ref 70–99)
Glucose-Capillary: 194 mg/dL — ABNORMAL HIGH (ref 70–99)
Glucose-Capillary: 282 mg/dL — ABNORMAL HIGH (ref 70–99)

## 2022-03-06 MED ORDER — IPRATROPIUM-ALBUTEROL 0.5-2.5 (3) MG/3ML IN SOLN
3.0000 mL | RESPIRATORY_TRACT | Status: DC | PRN
  Administered 2022-03-06: 3 mL via RESPIRATORY_TRACT
  Filled 2022-03-06: qty 3

## 2022-03-06 MED ORDER — GUAIFENESIN-DM 100-10 MG/5ML PO SYRP
5.0000 mL | ORAL_SOLUTION | ORAL | Status: DC | PRN
Start: 2022-03-06 — End: 2022-03-07
  Administered 2022-03-06: 5 mL via ORAL
  Filled 2022-03-06: qty 5

## 2022-03-06 NOTE — Progress Notes (Signed)
PROGRESS NOTE    Sarah [name]  TFT:[id number] DOB: 1960-12-21 DOA: 03/04/2022 PCP: Celene Squibb, MD   Brief Narrative:  61 y.o. female with medical history significant of T2DM, hypertension, hyperlipidemia who presents to the emergency due to worsening right flank pain.  Patient presented to the emergency department on 7/13 due to abdominal pain, urinary frequency and painful urination, she was diagnosed to have UTI, antibiotic was prescribed and patient was discharged home.  She states that she has taken about 4 doses of Keflex prescribed, but continued to complain of worsening right lower back/right groin pain which was accompanied with fever as high as 105F and chills at home,  so she returned to the ED for further evaluation and management   Assessment & Plan:   Principal Problem:   Acute pyelonephritis Active Problems:   Mixed hyperlipidemia   Diabetes mellitus with hyperglycemia ([place])   Essential hypertension   Hypoalbuminemia due to protein-calorie malnutrition ([place])  Assessment and Plan:  Acute pyelonephritis Elevated WBCs secondary to above - Patient was started on IV Ancef - Urine culture was positive for E. coli - Blood cx neg thus far - This am pt remains nauseated with abd discomfort. Having some difficult tolerating diet as a result. Overall improving    T2DM with hyperglycemia CBG 189, continue insulin pump Continue to monitor CBG  Hypoalbuminemia possibly secondary to mild protein calorie malnutrition Albumin 3.1, continue protein supplements  Essential hypertension -BP remains stable and well controlled -Cont off bp meds for now. BP meds were initially held due to soft BP at presentation  Mixed hyperlipidemia Continue Zocor    DVT prophylaxis: Lovenox Code Status: Full Family Communication: None at bedside Disposition Plan:  Status is: Inpatient Remains inpatient appropriate because: Need for IV antibiotics and IV fluid.   Consultants:   None  Procedures:  None  Antimicrobials:  Anti-infectives (From admission, onward)    Start     Dose/Rate Route Frequency Ordered Stop   03/05/22 1400  ceFAZolin (ANCEF) IVPB 1 g/50 mL premix        1 g 100 mL/hr over 30 Minutes Intravenous Every 8 hours 03/05/22 0929     03/04/22 2315  cefTRIAXone (ROCEPHIN) 2 g in sodium chloride 0.9 % 100 mL IVPB        2 g 200 mL/hr over 30 Minutes Intravenous  Once 03/04/22 2300 03/04/22 2357       Subjective: Patient seen and evaluated today with ongoing abdominal pain that is particularly worse with coughing.  Objective: Vitals:   03/05/22 2040 03/06/22 0314 03/06/22 0420 03/06/22 0852  BP: (!) 161/66 (!) 102/36 140/77 125/68  Pulse: 95 93 78 69  Resp: '16 20 20   '$ Temp: 98.8 F (37.1 C) (!) 101.1 F (38.4 C) 100.1 F (37.8 C)   TempSrc: Oral Oral Oral   SpO2: 100% 100% 95%   Weight:      Height:        Intake/Output Summary (Last 24 hours) at 03/06/2022 0929 Last data filed at 03/05/2022 1828 Gross per 24 hour  Intake 420 ml  Output 801 ml  Net -381 ml   Filed Weights   03/04/22 2016 03/05/22 0400  Weight: 85.7 kg 92.1 kg    Examination:  General exam: Appears calm and comfortable  Respiratory system: Clear to auscultation. Respiratory effort normal. Cardiovascular system: S1 & S2 heard, RRR.  Gastrointestinal system: Abdomen is soft Central nervous system: Alert and awake Extremities: No edema Skin: No significant lesions  noted Psychiatry: Flat affect.    Data Reviewed: I have personally reviewed following labs and imaging studies  CBC: Recent Labs  Lab 03/03/22 0345 03/04/22 2045 03/05/22 0753 03/06/22 0529  WBC 7.8 11.5* 9.7 8.5  NEUTROABS 4.2 8.3*  --   --   HGB 14.3 12.7 11.2* 11.8*  HCT 40.1 35.9* 32.3* 33.1*  MCV 80.7 79.8* 80.8 80.1  PLT 277 249 228 416   Basic Metabolic Panel: Recent Labs  Lab 03/03/22 0345 03/04/22 2045 03/05/22 0753 03/06/22 0529  NA 138 135 140 139  K 3.8 4.1 3.9  3.9  CL 102 104 108 106  CO2 '27 24 27 26  '$ GLUCOSE 174* 189* 121* 142*  BUN '21 11 12 11  '$ CREATININE 0.75 0.75 0.69 0.71  CALCIUM 9.1 8.5* 8.2* 8.7*  MG  --   --  1.9  --   PHOS  --   --  4.0  --    GFR: Estimated Creatinine Clearance: 77.6 mL/min (by C-G formula based on SCr of 0.71 mg/dL). Liver Function Tests: Recent Labs  Lab 03/03/22 0345 03/04/22 2045 03/05/22 0753 03/06/22 0529  AST 17 13* 13* 26  ALT '27 18 15 20  '$ ALKPHOS 91 77 61 77  BILITOT 0.9 0.4 0.8 0.5  PROT 8.1 7.4 6.7 6.9  ALBUMIN 3.6 3.1* 2.7* 2.7*   No results for input(s): "LIPASE", "AMYLASE" in the last 168 hours. No results for input(s): "AMMONIA" in the last 168 hours. Coagulation Profile: Recent Labs  Lab 03/03/22 0345  INR 1.0   Cardiac Enzymes: No results for input(s): "CKTOTAL", "CKMB", "CKMBINDEX", "TROPONINI" in the last 168 hours. BNP (last 3 results) No results for input(s): "PROBNP" in the last 8760 hours. HbA1C: No results for input(s): "HGBA1C" in the last 72 hours. CBG: Recent Labs  Lab 03/05/22 1738 03/05/22 2052 03/06/22 0009 03/06/22 0420 03/06/22 0718  GLUCAP 164* 262* 282* 150* 145*   Lipid Profile: No results for input(s): "CHOL", "HDL", "LDLCALC", "TRIG", "CHOLHDL", "LDLDIRECT" in the last 72 hours. Thyroid Function Tests: No results for input(s): "TSH", "T4TOTAL", "FREET4", "T3FREE", "THYROIDAB" in the last 72 hours. Anemia Panel: No results for input(s): "VITAMINB12", "FOLATE", "FERRITIN", "TIBC", "IRON", "RETICCTPCT" in the last 72 hours. Sepsis Labs: Recent Labs  Lab 03/03/22 0440 03/03/22 0603 03/04/22 2045 03/04/22 2257  LATICACIDVEN 1.6 1.8 1.2 1.0    Recent Results (from the past 240 hour(s))  Urine Culture     Status: Abnormal   Collection Time: 03/03/22  4:10 AM   Specimen: Urine, Clean Catch  Result Value Ref Range Status   Specimen Description   Final    URINE, CLEAN CATCH Performed at [facility], [address], [place], [place] [address]     Special Requests   Final    NONE Performed at [facility], [address] [place],  [address]    Culture >=100,000 COLONIES/mL ESCHERICHIA COLI (A)  Final   Report Status 03/05/2022 FINAL  Final   Organism ID, Bacteria ESCHERICHIA COLI (A)  Final      Susceptibility   Escherichia coli - MIC*    AMPICILLIN >=32 RESISTANT Resistant     CEFAZOLIN <=4 SENSITIVE Sensitive     CEFEPIME <=0.12 SENSITIVE Sensitive     CEFTRIAXONE <=0.25 SENSITIVE Sensitive     CIPROFLOXACIN <=0.25 SENSITIVE Sensitive     GENTAMICIN <=1 SENSITIVE Sensitive     IMIPENEM <=0.25 SENSITIVE Sensitive     NITROFURANTOIN <=16 SENSITIVE Sensitive     TRIMETH/SULFA <=20 SENSITIVE Sensitive  AMPICILLIN/SULBACTAM >=32 RESISTANT Resistant     PIP/TAZO <=4 SENSITIVE Sensitive     * >=100,000 COLONIES/mL ESCHERICHIA COLI  Resp Panel by RT-PCR (Flu A&B, Covid) Anterior Nasal Swab     Status: None   Collection Time: 03/03/22  4:53 AM   Specimen: Anterior Nasal Swab  Result Value Ref Range Status   SARS Coronavirus 2 by RT PCR NEGATIVE NEGATIVE Final    Comment: (NOTE) SARS-CoV-2 target nucleic acids are NOT DETECTED.  The SARS-CoV-2 RNA is generally detectable in upper respiratory specimens during the acute phase of infection. The lowest concentration of SARS-CoV-2 viral copies this assay can detect is 138 copies/mL. A negative result does not preclude SARS-Cov-2 infection and should not be used as the sole basis for treatment or other patient management decisions. A negative result may occur with  improper specimen collection/handling, submission of specimen other than nasopharyngeal swab, presence of viral mutation(s) within the areas targeted by this assay, and inadequate number of viral copies(<138 copies/mL). A negative result must be combined with clinical observations, patient history, and epidemiological information. The expected result is Negative.  Fact Sheet for Patients:   EntrepreneurPulse.com.au  Fact Sheet for Healthcare Providers:  IncredibleEmployment.be  This test is no t yet approved or cleared by the [place] FDA and  has been authorized for detection and/or diagnosis of SARS-CoV-2 by FDA under an Emergency Use Authorization (EUA). This EUA will remain  in effect (meaning this test can be used) for the duration of the COVID-19 declaration under Section 564(b)(1) of the Act, 21 U.S.C.section 360bbb-3(b)(1), unless the authorization is terminated  or revoked sooner.       Influenza A by PCR NEGATIVE NEGATIVE Final   Influenza B by PCR NEGATIVE NEGATIVE Final    Comment: (NOTE) The Xpert Xpress SARS-CoV-2/FLU/RSV plus assay is intended as an aid in the diagnosis of influenza from Nasopharyngeal swab specimens and should not be used as a sole basis for treatment. Nasal washings and aspirates are unacceptable for Xpert Xpress SARS-CoV-2/FLU/RSV testing.  Fact Sheet for Patients: EntrepreneurPulse.com.au  Fact Sheet for Healthcare Providers: IncredibleEmployment.be  This test is not yet approved or cleared by the [place] FDA and has been authorized for detection and/or diagnosis of SARS-CoV-2 by FDA under an Emergency Use Authorization (EUA). This EUA will remain in effect (meaning this test can be used) for the duration of the COVID-19 declaration under Section 564(b)(1) of the Act, 21 U.S.C. section 360bbb-3(b)(1), unless the authorization is terminated or revoked.  Performed at [facility], [address] [place], [place] [address]   Blood Culture (routine x 2)     Status: None (Preliminary result)   Collection Time: 03/03/22  6:03 AM   Specimen: BLOOD LEFT FOREARM  Result Value Ref Range Status   Specimen Description BLOOD LEFT FOREARM  Final   Special Requests   Final    BOTTLES DRAWN AEROBIC AND ANAEROBIC Blood Culture results may not be optimal due  to an excessive volume of blood received in culture bottles   Culture   Final    NO GROWTH 3 DAYS Performed at [facility], [address], [place], [place] [address]    Report Status PENDING  Incomplete  Blood Culture (routine x 2)     Status: None (Preliminary result)   Collection Time: 03/03/22  6:03 AM   Specimen: BLOOD RIGHT FOREARM  Result Value Ref Range Status   Specimen Description BLOOD RIGHT FOREARM  Final   Special Requests   Final  BOTTLES DRAWN AEROBIC AND ANAEROBIC Blood Culture results may not be optimal due to an excessive volume of blood received in culture bottles   Culture   Final    NO GROWTH 3 DAYS Performed at [facility], [address], [place], [place] [address]    Report Status PENDING  Incomplete         Radiology Studies: No results found.      Scheduled Meds:  aspirin EC  81 mg Oral Daily   enoxaparin (LOVENOX) injection  40 mg Subcutaneous Daily   feeding supplement (GLUCERNA SHAKE)  237 mL Oral TID BM   linaclotide  145 mcg Oral QAC breakfast   metoprolol tartrate  50 mg Oral BID   [START ON 03/07/2022] simvastatin  20 mg Oral Q M,W,F   Continuous Infusions:   ceFAZolin (ANCEF) IV 1 g (03/06/22 0550)     LOS: 1 day    Time spent: 35 minutes    Emmerson Shuffield Darleen Crocker, DO [facility]  If 7PM-7AM, please contact night-coverage [email or web address] 03/06/2022, 9:29 AM

## 2022-03-06 NOTE — Telephone Encounter (Signed)
Post ED Visit - Positive Culture Follow-up  Culture report reviewed by antimicrobial stewardship pharmacist: [place] Team '[x]'$  Noe Gens, Pharm.D. '[]'$  Heide Guile, Pharm.D., BCPS AQ-ID '[]'$  Parks Neptune, Pharm.D., BCPS '[]'$  Alycia Rossetti, Pharm.D., BCPS '[]'$  [place], Pharm.D., BCPS, AAHIVP '[]'$  Legrand Como, Pharm.D., BCPS, AAHIVP '[]'$  Salome Arnt, PharmD, BCPS '[]'$  Johnnette Gourd, PharmD, BCPS '[]'$  Hughes Better, PharmD, BCPS '[]'$  Leeroy Cha, PharmD '[]'$  Laqueta Linden, PharmD, BCPS '[]'$  Albertina Parr, PharmD  [place] Team '[]'$  Leodis Sias, PharmD '[]'$  Lindell Spar, PharmD '[]'$  Royetta Asal, PharmD '[]'$  Graylin Shiver, Rph '[]'$  Rema Fendt) Glennon Mac, PharmD '[]'$  Arlyn Dunning, PharmD '[]'$  Netta Cedars, PharmD '[]'$  Dia Sitter, PharmD '[]'$  Leone Haven, PharmD '[]'$  Gretta Arab, PharmD '[]'$  Theodis Shove, PharmD '[]'$  Peggyann Juba, PharmD '[]'$  Reuel Boom, PharmD   Positive urine culture Treated with Cephalexin, organism sensitive to the same and no further patient follow-up is required at this time.  Glennon Hamilton 03/06/2022, 5:24 PM

## 2022-03-07 DIAGNOSIS — N10 Acute tubulo-interstitial nephritis: Secondary | ICD-10-CM | POA: Diagnosis not present

## 2022-03-07 LAB — BASIC METABOLIC PANEL
Anion gap: 9 (ref 5–15)
BUN: 13 mg/dL (ref 8–23)
CO2: 26 mmol/L (ref 22–32)
Calcium: 8.7 mg/dL — ABNORMAL LOW (ref 8.9–10.3)
Chloride: 106 mmol/L (ref 98–111)
Creatinine, Ser: 0.69 mg/dL (ref 0.44–1.00)
GFR, Estimated: >60 mL/min (ref >60)
Glucose, Bld: 114 mg/dL — ABNORMAL HIGH (ref 70–99)
Potassium: 4.2 mmol/L (ref 3.5–5.1)
Sodium: 141 mmol/L (ref 135–145)

## 2022-03-07 LAB — GLUCOSE, CAPILLARY
Glucose-Capillary: 114 mg/dL — ABNORMAL HIGH (ref 70–99)
Glucose-Capillary: 144 mg/dL — ABNORMAL HIGH (ref 70–99)
Glucose-Capillary: 199 mg/dL — ABNORMAL HIGH (ref 70–99)

## 2022-03-07 LAB — CBC
HCT: 34.4 % — ABNORMAL LOW (ref 36.0–46.0)
Hemoglobin: 12 g/dL (ref 12.0–15.0)
MCH: 28.2 pg (ref 26.0–34.0)
MCHC: 34.9 g/dL (ref 30.0–36.0)
MCV: 80.9 fL (ref 80.0–100.0)
Platelets: 291 10*3/uL (ref 150–400)
RBC: 4.25 MIL/uL (ref 3.87–5.11)
RDW: 14.6 % (ref 11.5–15.5)
WBC: 8.5 10*3/uL (ref 4.0–10.5)
nRBC: 0 % (ref 0.0–0.2)

## 2022-03-07 LAB — MAGNESIUM: Magnesium: 2 mg/dL (ref 1.7–2.4)

## 2022-03-07 MED ORDER — CEFDINIR 300 MG PO CAPS: 300.0000 mg | ORAL_CAPSULE | Freq: Two times a day (BID) | ORAL | 0 refills | Status: AC

## 2022-03-07 MED ORDER — GLUCERNA SHAKE PO LIQD: 237.0000 mL | Freq: Three times a day (TID) | ORAL | 0 refills | Status: AC

## 2022-03-07 NOTE — Progress Notes (Signed)
  Transition of Care [facility]) Screening Note   Patient Details  Name: Sarah [name] Date of Birth: 04/27/1961   Transition of Care [facility]) CM/SW Contact:    Iona Beard, [place] Phone Number: 03/07/2022, 10:07 AM    Transition of Care Department [facility]) has reviewed patient and no TOC needs have been identified at this time. We will continue to monitor patient advancement through interdisciplinary progression rounds. If new patient transition needs arise, please place a TOC consult.

## 2022-03-07 NOTE — Discharge Summary (Signed)
Physician Discharge Summary  Sarah [name] WNU:[id number] DOB: 1961/04/18 DOA: 03/04/2022  PCP: Celene Squibb, MD  Admit date: 03/04/2022  Discharge date: 03/07/2022  Admitted From:Home  Disposition:  Home  Recommendations for Outpatient Follow-up:  Follow up with PCP in 1-2 weeks Continue on Omnicef as prescribed for 7 more days to complete total 10-day course of treatment for E. coli pyelonephritis Continue other home medications as prior  Home Health: None  Equipment/Devices: None  Discharge Condition:Stable  CODE STATUS: Full  Diet recommendation: Heart Healthy/carb modified  Brief/Interim Summary: 61 y.o. female with medical history significant of T2DM, hypertension, hyperlipidemia who presents to the emergency due to worsening right flank pain.  Patient presented to the emergency department on 7/13 due to abdominal pain, urinary frequency and painful urination, she was diagnosed to have UTI, antibiotic was prescribed and patient was discharged home.  She states that she has taken about 4 doses of Keflex prescribed, but continued to complain of worsening right lower back/right groin pain which was accompanied with fever as high as 105F and chills at home,  so she returned to the ED for further evaluation and management.  She was diagnosed with acute pyelonephritis and cultures returned positive for E. coli which was sensitive to Ancef and she was maintained on this with clinical improvement noted.  She is now in stable condition for discharge after dietary progression and no further significant pain symptoms.  She will be transition to [facility] as noted above to complete course of treatment.  No other acute events noted.  Discharge Diagnoses:  Principal Problem:   Acute pyelonephritis Active Problems:   Mixed hyperlipidemia   Diabetes mellitus with hyperglycemia ([place])   Essential hypertension   Hypoalbuminemia due to protein-calorie malnutrition ([place])  Principal discharge  diagnosis: Acute pyelonephritis secondary to E. coli.  Discharge Instructions  Discharge Instructions     Diet - low sodium heart healthy   Complete by: As directed    Increase activity slowly   Complete by: As directed       Allergies as of 03/07/2022       Reactions   Sulfonamide Derivatives Itching   Latex Itching, Rash, Other (See Comments)   Powdered only        Medication List     STOP taking these medications    cephALEXin 500 MG capsule Commonly known as: KEFLEX       TAKE these medications    amLODipine 5 MG tablet Commonly known as: NORVASC Take 5 mg by mouth daily.   aspirin EC 81 MG tablet Take 81 mg by mouth daily.   cefdinir 300 MG capsule Commonly known as: OMNICEF Take 1 capsule (300 mg total) by mouth 2 (two) times daily for 7 days.   co-enzyme Q-10 30 MG capsule Take 30 mg by mouth daily.   Dexcom G6 Receiver Devi by Does not apply route.   diclofenac Sodium 1 % Gel Commonly known as: Voltaren Apply 2 g topically 4 (four) times daily.   escitalopram 5 MG tablet Commonly known as: LEXAPRO Take 10 mg by mouth at bedtime.   feeding supplement (GLUCERNA SHAKE) Liqd Take 237 mLs by mouth 3 (three) times daily between meals.   ibuprofen 800 MG tablet Commonly known as: ADVIL Take 800 mg by mouth every 6 (six) hours as needed for moderate pain.   insulin aspart 100 UNIT/ML injection Commonly known as: novoLOG 110u per day via insulin pump   linaclotide 145 MCG Caps capsule Commonly known  as: Linzess Take 1 capsule (145 mcg total) by mouth daily before breakfast.   LORazepam 0.5 MG tablet Commonly known as: ATIVAN Take 0.5 mg by mouth 2 (two) times daily as needed for anxiety.   meloxicam 7.5 MG tablet Commonly known as: Mobic Take 1 tablet (7.5 mg total) by mouth daily.   metoprolol tartrate 50 MG tablet Commonly known as: LOPRESSOR Take 1 tablet (50 mg total) by mouth 2 (two) times daily.   ondansetron 4 MG  tablet Commonly known as: ZOFRAN Take 1 tablet (4 mg total) by mouth every 8 (eight) hours as needed for nausea or vomiting.   Ozempic (0.25 or 0.5 MG/DOSE) 2 MG/3ML Sopn Generic drug: Semaglutide(0.25 or 0.'5MG'$ /DOS) Inject 0.5 mg into the skin once a week.   simvastatin 20 MG tablet Commonly known as: ZOCOR Take 20 mg by mouth See admin instructions. Every Monday and Friday   T: slim X2 Ins Pmp/Control 7.4 Devi 1 Device by Does not apply route every other day. This prescription is for T:slimX2 51m cartridges.   Vitamin D3 1.25 MG (50000 UT) Caps Take 1 capsule by mouth once a week.        Follow-up Information     HCelene Squibb MD. Schedule an appointment as soon as possible for a visit in 1 week(s).   Specialty: Internal Medicine Contact information: [id number]               Allergies  Allergen Reactions   Sulfonamide Derivatives Itching   Latex Itching, Rash and Other (See Comments)    Powdered only    Consultations: None   Procedures/Studies: CT ABDOMEN PELVIS W CONTRAST  Result Date: 03/03/2022 CLINICAL DATA:  Flank pain. Kidney stone suspected. Frequent urination. EXAM: CT ABDOMEN AND PELVIS WITH CONTRAST TECHNIQUE: Multidetector CT imaging of the abdomen and pelvis was performed using the standard protocol following bolus administration of intravenous contrast. RADIATION DOSE REDUCTION: This exam was performed according to the departmental dose-optimization program which includes automated exposure control, adjustment of the mA and/or kV according to patient size and/or use of iterative reconstruction technique. CONTRAST:  1040mOMNIPAQUE IOHEXOL 300 MG/ML  SOLN COMPARISON:  CTs with IV contrast 10/23/21, 04/04/21. FINDINGS: Lower chest: No acute abnormality. Hepatobiliary: 19 cm length mildly steatotic. No mass enhancement. Unremarkable gallbladder and bile ducts. Pancreas: No focal abnormality. Spleen: no focal  abnormality or splenomegaly. Adrenals/Urinary Tract: There is no adrenal mass. Left renal cortex is unremarkable. There is asymmetric right perinephric stranding and subtle hypoenhancement over portions of the upper pole cortex concerning for pyelonephritis. There are no urinary stones or hydroureteronephrosis. There is mild bladder thickening versus underdistention. Stomach/Bowel: No dilatation or wall thickening. An appendix is not seen. There are colonic diverticula without evidence of acute diverticulitis. Intact widely patent surgical colocolic anastomosis in the proximal sigmoid segment. Vascular/Lymphatic: Aortic atherosclerosis. No enlarged abdominal or pelvic lymph nodes. Reproductive: Status post hysterectomy. No adnexal masses. Other: There is no free air, hemorrhage or free fluid, no incarcerated hernia. Abdominal wall is intact. Musculoskeletal: mild bilateral hip DJD. Mild degenerative change lumbar spine. IMPRESSION: 1. Subtle patchy hypoenhancement over portions of the right upper pole renal cortex with adjacent stranding. This does not have the typical wedge shape of an infarct and is suspected to represent pyelonephritis. 2. Cystitis versus bladder nondistention. Correlate clinically with urinalysis. 3. No evidence of urinary stone or obstruction. 4. Mild hepatic steatosis and enlargement. 5. Diverticulosis without evidence of diverticulitis.  6. Aortic atherosclerosis. Electronically Signed   By: Telford Nab M.D.   On: 03/03/2022 06:20   DG Chest Portable 1 View  Result Date: 03/03/2022 CLINICAL DATA:  61 year old female with fever cough and chest pain. EXAM: PORTABLE CHEST 1 VIEW COMPARISON:  01/07/2021 chest radiographs and earlier. FINDINGS: Portable AP upright view at 0436 hours. Slightly lower lung volumes. Mediastinal contours remain normal. Visualized tracheal air column is within normal limits. Allowing for portable technique the lungs are clear. No pneumothorax or pleural effusion.  No osseous abnormality identified. IMPRESSION: Negative portable chest. Electronically Signed   By: Genevie Ann M.D.   On: 03/03/2022 05:23     Discharge Exam: Vitals:   03/06/22 2304 03/07/22 0532  BP:  (!) 146/61  Pulse:  78  Resp:  17  Temp:  98.4 F (36.9 C)  SpO2: 100% 99%   Vitals:   03/06/22 2117 03/06/22 2241 03/06/22 2304 03/07/22 0532  BP:    (!) 146/61  Pulse:    78  Resp:    17  Temp: 98 F (36.7 C)   98.4 F (36.9 C)  TempSrc: Oral   Oral  SpO2:  100% 100% 99%  Weight:      Height:        General: Pt is alert, awake, not in acute distress Cardiovascular: RRR, S1/S2 +, no rubs, no gallops Respiratory: CTA bilaterally, no wheezing, no rhonchi Abdominal: Soft, NT, ND, bowel sounds + Extremities: no edema, no cyanosis    The results of significant diagnostics from this hospitalization (including imaging, microbiology, ancillary and laboratory) are listed below for reference.     Microbiology: Recent Results (from the past 240 hour(s))  Urine Culture     Status: Abnormal   Collection Time: 03/03/22  4:10 AM   Specimen: Urine, Clean Catch  Result Value Ref Range Status   Specimen Description   Final    URINE, CLEAN CATCH Performed at [facility], [address] [place], [place] [address]    Special Requests   Final    NONE Performed at [facility], [address] [place], [place] [address]    Culture >=100,000 COLONIES/mL ESCHERICHIA COLI (A)  Final   Report Status 03/05/2022 FINAL  Final   Organism ID, Bacteria ESCHERICHIA COLI (A)  Final      Susceptibility   Escherichia coli - MIC*    AMPICILLIN >=32 RESISTANT Resistant     CEFAZOLIN <=4 SENSITIVE Sensitive     CEFEPIME <=0.12 SENSITIVE Sensitive     CEFTRIAXONE <=0.25 SENSITIVE Sensitive     CIPROFLOXACIN <=0.25 SENSITIVE Sensitive     GENTAMICIN <=1 SENSITIVE Sensitive     IMIPENEM <=0.25 SENSITIVE Sensitive     NITROFURANTOIN <=16 SENSITIVE Sensitive     TRIMETH/SULFA <=20 SENSITIVE  Sensitive     AMPICILLIN/SULBACTAM >=32 RESISTANT Resistant     PIP/TAZO <=4 SENSITIVE Sensitive     * >=100,000 COLONIES/mL ESCHERICHIA COLI  Resp Panel by RT-PCR (Flu A&B, Covid) Anterior Nasal Swab     Status: None   Collection Time: 03/03/22  4:53 AM   Specimen: Anterior Nasal Swab  Result Value Ref Range Status   SARS Coronavirus 2 by RT PCR NEGATIVE NEGATIVE Final    Comment: (NOTE) SARS-CoV-2 target nucleic acids are NOT DETECTED.  The SARS-CoV-2 RNA is generally detectable in upper respiratory specimens during the acute phase of infection. The lowest concentration of SARS-CoV-2 viral copies this assay can detect is 138 copies/mL. A negative result does not preclude  SARS-Cov-2 infection and should not be used as the sole basis for treatment or other patient management decisions. A negative result may occur with  improper specimen collection/handling, submission of specimen other than nasopharyngeal swab, presence of viral mutation(s) within the areas targeted by this assay, and inadequate number of viral copies(<138 copies/mL). A negative result must be combined with clinical observations, patient history, and epidemiological information. The expected result is Negative.  Fact Sheet for Patients:  EntrepreneurPulse.com.au  Fact Sheet for Healthcare Providers:  IncredibleEmployment.be  This test is no t yet approved or cleared by the [place] FDA and  has been authorized for detection and/or diagnosis of SARS-CoV-2 by FDA under an Emergency Use Authorization (EUA). This EUA will remain  in effect (meaning this test can be used) for the duration of the COVID-19 declaration under Section 564(b)(1) of the Act, 21 U.S.C.section 360bbb-3(b)(1), unless the authorization is terminated  or revoked sooner.       Influenza A by PCR NEGATIVE NEGATIVE Final   Influenza B by PCR NEGATIVE NEGATIVE Final    Comment: (NOTE) The Xpert Xpress  SARS-CoV-2/FLU/RSV plus assay is intended as an aid in the diagnosis of influenza from Nasopharyngeal swab specimens and should not be used as a sole basis for treatment. Nasal washings and aspirates are unacceptable for Xpert Xpress SARS-CoV-2/FLU/RSV testing.  Fact Sheet for Patients: EntrepreneurPulse.com.au  Fact Sheet for Healthcare Providers: IncredibleEmployment.be  This test is not yet approved or cleared by the [place] FDA and has been authorized for detection and/or diagnosis of SARS-CoV-2 by FDA under an Emergency Use Authorization (EUA). This EUA will remain in effect (meaning this test can be used) for the duration of the COVID-19 declaration under Section 564(b)(1) of the Act, 21 U.S.C. section 360bbb-3(b)(1), unless the authorization is terminated or revoked.  Performed at [facility], [address] [place], [place] [address]   Blood Culture (routine x 2)     Status: None (Preliminary result)   Collection Time: 03/03/22  6:03 AM   Specimen: BLOOD LEFT FOREARM  Result Value Ref Range Status   Specimen Description BLOOD LEFT FOREARM  Final   Special Requests   Final    BOTTLES DRAWN AEROBIC AND ANAEROBIC Blood Culture results may not be optimal due to an excessive volume of blood received in culture bottles   Culture   Final    NO GROWTH 4 DAYS Performed at [facility], [address], [place],  [address]    Report Status PENDING  Incomplete  Blood Culture (routine x 2)     Status: None (Preliminary result)   Collection Time: 03/03/22  6:03 AM   Specimen: BLOOD RIGHT FOREARM  Result Value Ref Range Status   Specimen Description BLOOD RIGHT FOREARM  Final   Special Requests   Final    BOTTLES DRAWN AEROBIC AND ANAEROBIC Blood Culture results may not be optimal due to an excessive volume of blood received in culture bottles   Culture   Final    NO GROWTH 4 DAYS Performed at [facility], [address],  [place],  [address]    Report Status PENDING  Incomplete     Labs: BNP (last 3 results) No results for input(s): "BNP" in the last 8760 hours. Basic Metabolic Panel: Recent Labs  Lab 03/03/22 0345 03/04/22 2045 03/05/22 0753 03/06/22 0529 03/07/22 0527  NA 138 135 140 139 141  K 3.8 4.1 3.9 3.9 4.2  CL 102 104 108 106 106  CO2 27  $'24 27 26 26  'I$ GLUCOSE 174* 189* 121* 142* 114*  BUN '21 11 12 11 13  '$ CREATININE 0.75 0.75 0.69 0.71 0.69  CALCIUM 9.1 8.5* 8.2* 8.7* 8.7*  MG  --   --  1.9  --  2.0  PHOS  --   --  4.0  --   --    Liver Function Tests: Recent Labs  Lab 03/03/22 0345 03/04/22 2045 03/05/22 0753 03/06/22 0529  AST 17 13* 13* 26  ALT '27 18 15 20  '$ ALKPHOS 91 77 61 77  BILITOT 0.9 0.4 0.8 0.5  PROT 8.1 7.4 6.7 6.9  ALBUMIN 3.6 3.1* 2.7* 2.7*   No results for input(s): "LIPASE", "AMYLASE" in the last 168 hours. No results for input(s): "AMMONIA" in the last 168 hours. CBC: Recent Labs  Lab 03/03/22 0345 03/04/22 2045 03/05/22 0753 03/06/22 0529 03/07/22 0527  WBC 7.8 11.5* 9.7 8.5 8.5  NEUTROABS 4.2 8.3*  --   --   --   HGB 14.3 12.7 11.2* 11.8* 12.0  HCT 40.1 35.9* 32.3* 33.1* 34.4*  MCV 80.7 79.8* 80.8 80.1 80.9  PLT 277 249 228 262 291   Cardiac Enzymes: No results for input(s): "CKTOTAL", "CKMB", "CKMBINDEX", "TROPONINI" in the last 168 hours. BNP: Invalid input(s): "POCBNP" CBG: Recent Labs  Lab 03/06/22 1651 03/06/22 2003 03/07/22 0007 03/07/22 0401 03/07/22 0710  GLUCAP 173* 194* 199* 114* 144*   D-Dimer No results for input(s): "DDIMER" in the last 72 hours. Hgb A1c No results for input(s): "HGBA1C" in the last 72 hours. Lipid Profile No results for input(s): "CHOL", "HDL", "LDLCALC", "TRIG", "CHOLHDL", "LDLDIRECT" in the last 72 hours. Thyroid function studies No results for input(s): "TSH", "T4TOTAL", "T3FREE", "THYROIDAB" in the last 72 hours.  Invalid input(s): "FREET3" Anemia work up No results for input(s):  "VITAMINB12", "FOLATE", "FERRITIN", "TIBC", "IRON", "RETICCTPCT" in the last 72 hours. Urinalysis    Component Value Date/Time   COLORURINE YELLOW 03/03/2022 0410   APPEARANCEUR CLOUDY (A) 03/03/2022 0410   LABSPEC 1.016 03/03/2022 0410   PHURINE 6.0 03/03/2022 0410   GLUCOSEU NEGATIVE 03/03/2022 0410   HGBUR MODERATE (A) 03/03/2022 0410   BILIRUBINUR NEGATIVE 03/03/2022 0410   KETONESUR NEGATIVE 03/03/2022 0410   PROTEINUR 30 (A) 03/03/2022 0410   UROBILINOGEN 0.2 08/11/2013 2025   NITRITE NEGATIVE 03/03/2022 0410   LEUKOCYTESUR LARGE (A) 03/03/2022 0410   Sepsis Labs Recent Labs  Lab 03/04/22 2045 03/05/22 0753 03/06/22 0529 03/07/22 0527  WBC 11.5* 9.7 8.5 8.5   Microbiology Recent Results (from the past 240 hour(s))  Urine Culture     Status: Abnormal   Collection Time: 03/03/22  4:10 AM   Specimen: Urine, Clean Catch  Result Value Ref Range Status   Specimen Description   Final    URINE, CLEAN CATCH Performed at [facility], [address] [place], [place] [address]    Special Requests   Final    NONE Performed at [facility], [address] [place], [place] [address]    Culture >=100,000 COLONIES/mL ESCHERICHIA COLI (A)  Final   Report Status 03/05/2022 FINAL  Final   Organism ID, Bacteria ESCHERICHIA COLI (A)  Final      Susceptibility   Escherichia coli - MIC*    AMPICILLIN >=32 RESISTANT Resistant     CEFAZOLIN <=4 SENSITIVE Sensitive     CEFEPIME <=0.12 SENSITIVE Sensitive     CEFTRIAXONE <=0.25 SENSITIVE Sensitive     CIPROFLOXACIN <=0.25 SENSITIVE Sensitive     GENTAMICIN <=1  SENSITIVE Sensitive     IMIPENEM <=0.25 SENSITIVE Sensitive     NITROFURANTOIN <=16 SENSITIVE Sensitive     TRIMETH/SULFA <=20 SENSITIVE Sensitive     AMPICILLIN/SULBACTAM >=32 RESISTANT Resistant     PIP/TAZO <=4 SENSITIVE Sensitive     * >=100,000 COLONIES/mL ESCHERICHIA COLI  Resp Panel by RT-PCR (Flu A&B, Covid) Anterior Nasal Swab     Status: None   Collection Time:  03/03/22  4:53 AM   Specimen: Anterior Nasal Swab  Result Value Ref Range Status   SARS Coronavirus 2 by RT PCR NEGATIVE NEGATIVE Final    Comment: (NOTE) SARS-CoV-2 target nucleic acids are NOT DETECTED.  The SARS-CoV-2 RNA is generally detectable in upper respiratory specimens during the acute phase of infection. The lowest concentration of SARS-CoV-2 viral copies this assay can detect is 138 copies/mL. A negative result does not preclude SARS-Cov-2 infection and should not be used as the sole basis for treatment or other patient management decisions. A negative result may occur with  improper specimen collection/handling, submission of specimen other than nasopharyngeal swab, presence of viral mutation(s) within the areas targeted by this assay, and inadequate number of viral copies(<138 copies/mL). A negative result must be combined with clinical observations, patient history, and epidemiological information. The expected result is Negative.  Fact Sheet for Patients:  EntrepreneurPulse.com.au  Fact Sheet for Healthcare Providers:  IncredibleEmployment.be  This test is no t yet approved or cleared by the [place] FDA and  has been authorized for detection and/or diagnosis of SARS-CoV-2 by FDA under an Emergency Use Authorization (EUA). This EUA will remain  in effect (meaning this test can be used) for the duration of the COVID-19 declaration under Section 564(b)(1) of the Act, 21 U.S.C.section 360bbb-3(b)(1), unless the authorization is terminated  or revoked sooner.       Influenza A by PCR NEGATIVE NEGATIVE Final   Influenza B by PCR NEGATIVE NEGATIVE Final    Comment: (NOTE) The Xpert Xpress SARS-CoV-2/FLU/RSV plus assay is intended as an aid in the diagnosis of influenza from Nasopharyngeal swab specimens and should not be used as a sole basis for treatment. Nasal washings and aspirates are unacceptable for Xpert Xpress  SARS-CoV-2/FLU/RSV testing.  Fact Sheet for Patients: EntrepreneurPulse.com.au  Fact Sheet for Healthcare Providers: IncredibleEmployment.be  This test is not yet approved or cleared by the [place] FDA and has been authorized for detection and/or diagnosis of SARS-CoV-2 by FDA under an Emergency Use Authorization (EUA). This EUA will remain in effect (meaning this test can be used) for the duration of the COVID-19 declaration under Section 564(b)(1) of the Act, 21 U.S.C. section 360bbb-3(b)(1), unless the authorization is terminated or revoked.  Performed at [facility], [address], [place], [place] [address]   Blood Culture (routine x 2)     Status: None (Preliminary result)   Collection Time: 03/03/22  6:03 AM   Specimen: BLOOD LEFT FOREARM  Result Value Ref Range Status   Specimen Description BLOOD LEFT FOREARM  Final   Special Requests   Final    BOTTLES DRAWN AEROBIC AND ANAEROBIC Blood Culture results may not be optimal due to an excessive volume of blood received in culture bottles   Culture   Final    NO GROWTH 4 DAYS Performed at [facility], [address] [place], [place] [address]    Report Status PENDING  Incomplete  Blood Culture (routine x 2)     Status: None (Preliminary result)   Collection Time: 03/03/22  6:03 AM   Specimen: BLOOD RIGHT FOREARM  Result Value Ref Range Status   Specimen Description BLOOD RIGHT FOREARM  Final   Special Requests   Final    BOTTLES DRAWN AEROBIC AND ANAEROBIC Blood Culture results may not be optimal due to an excessive volume of blood received in culture bottles   Culture   Final    NO GROWTH 4 DAYS Performed at [facility], [address], [place], [place] [address]    Report Status PENDING  Incomplete     Time coordinating discharge: 35 minutes  SIGNED:   Rodena Goldmann, DO [facility] 03/07/2022, 9:00 AM  If 7PM-7AM, please contact  night-coverage [email or web address]

## 2022-03-08 LAB — CULTURE, BLOOD (ROUTINE X 2)
Culture: NO GROWTH
Culture: NO GROWTH

## 2022-03-31 ENCOUNTER — Telehealth: Payer: Self-pay | Admitting: Cardiovascular Disease

## 2022-03-31 MED ORDER — METOPROLOL TARTRATE 50 MG PO TABS: 50.0000 mg | ORAL_TABLET | Freq: Two times a day (BID) | ORAL | 0 refills | Status: DC

## 2022-03-31 NOTE — Telephone Encounter (Signed)
*  STAT* If patient is at the pharmacy, call can be transferred to refill team.   1. Which medications need to be refilled? (please list name of each medication and dose if known) Metoprolol  2. Which pharmacy/location (including street and city if local pharmacy) is medication to be sent to? [facility]   3. Do they need a 30 day or 90 day supply? Need enough her appointment on 06-10-22

## 2022-03-31 NOTE — Telephone Encounter (Signed)
Refill complete 

## 2022-04-20 ENCOUNTER — Telehealth: Payer: Self-pay | Admitting: Nutrition

## 2022-04-20 NOTE — Telephone Encounter (Signed)
Patient reports that she got a new pump and tried to transfer settings herself, but now blood sugars have been in the 300s.  She has increased her basal rate, but it has made no difference. She was told to call the help line, but wanted an appointment with me to help her with this.  She was not available to come in today.  Appt. Set for Tuesday.

## 2022-04-21 ENCOUNTER — Telehealth: Payer: Self-pay | Admitting: Dietician

## 2022-04-21 NOTE — Telephone Encounter (Signed)
Called patient regarding MD referral need and upcoming appointment.   Will work to obtain referral and keep her appointment with diabetes educator on 04/26/2022. Patient of Dr. Loanne Drilling, states that she has not heard back from office regarding new endocrinologist and appointment. Message left with scheduler.  Patient to call for questions.  Sarah [name], RD, LDN, CDCES

## 2022-04-26 ENCOUNTER — Ambulatory Visit: Payer: BC Managed Care – PPO | Admitting: Nutrition

## 2022-04-26 ENCOUNTER — Encounter: Payer: BC Managed Care – PPO | Attending: Internal Medicine | Admitting: Nutrition

## 2022-04-26 DIAGNOSIS — E0865 Diabetes mellitus due to underlying condition with hyperglycemia: Secondary | ICD-10-CM | POA: Insufficient documentation

## 2022-04-26 DIAGNOSIS — E1165 Type 2 diabetes mellitus with hyperglycemia: Secondary | ICD-10-CM | POA: Insufficient documentation

## 2022-04-28 NOTE — Patient Instructions (Signed)
Make sure to bolus for all meals and snack.   Do correction doses when blood sugar is over 225.

## 2022-04-28 NOTE — Progress Notes (Signed)
Patient is here because her PDM failed to work.  She reports that blood sugars are not as high, averaging in the low 200s.  Settings were checked and she did not have the carb ratio correct in settings, nor the ISF.  She was reminded of the need to bolus before all meals and snacks and she reports doing this.  She will call in one week if blood sugars do not come down. She reports that she has called twice about getting a new MD, but no one has called her back.  She was directed to the front desk after her visit to schedule with a new MD.

## 2022-05-09 ENCOUNTER — Emergency Department (HOSPITAL_COMMUNITY): Payer: BC Managed Care – PPO

## 2022-05-09 ENCOUNTER — Encounter (HOSPITAL_COMMUNITY): Payer: Self-pay

## 2022-05-09 ENCOUNTER — Other Ambulatory Visit: Payer: Self-pay

## 2022-05-09 ENCOUNTER — Emergency Department (HOSPITAL_COMMUNITY)
Admission: EM | Admit: 2022-05-09 | Discharge: 2022-05-09 | Disposition: A | Discharge status: Home / Self Care | Payer: BC Managed Care – PPO | Attending: Emergency Medicine | Admitting: Emergency Medicine

## 2022-05-09 DIAGNOSIS — Z91040 Latex allergy status: Secondary | ICD-10-CM | POA: Diagnosis not present

## 2022-05-09 DIAGNOSIS — Z794 Long term (current) use of insulin: Secondary | ICD-10-CM | POA: Diagnosis not present

## 2022-05-09 DIAGNOSIS — S6992XA Unspecified injury of left wrist, hand and finger(s), initial encounter: Secondary | ICD-10-CM | POA: Diagnosis present

## 2022-05-09 DIAGNOSIS — S60222A Contusion of left hand, initial encounter: Secondary | ICD-10-CM | POA: Insufficient documentation

## 2022-05-09 DIAGNOSIS — W1809XA Striking against other object with subsequent fall, initial encounter: Secondary | ICD-10-CM | POA: Diagnosis not present

## 2022-05-09 DIAGNOSIS — Z79899 Other long term (current) drug therapy: Secondary | ICD-10-CM | POA: Insufficient documentation

## 2022-05-09 DIAGNOSIS — Z7982 Long term (current) use of aspirin: Secondary | ICD-10-CM | POA: Insufficient documentation

## 2022-05-09 MED ORDER — IBUPROFEN 800 MG PO TABS
800.0000 mg | ORAL_TABLET | Freq: Once | ORAL | Status: AC
Start: 2022-05-09 — End: 2022-05-09
  Administered 2022-05-09: 800 mg via ORAL
  Filled 2022-05-09: qty 1

## 2022-05-09 NOTE — ED Provider Notes (Signed)
[facility] EMERGENCY DEPARTMENT Provider Note   CSN: [id number] Arrival date & time: 05/09/22  1927     History  Chief Complaint  Patient presents with   Hand Pain    Sarah [name] is a 61 y.o. female.  Pt reports she tripped over her dress and injured her left hand.  Pt reports her thumb was pulled back.  Pt complains of pain.  Pt has not taken any medication for pain.  Pt denies any other injuries   The history is provided by the patient. No language interpreter was used.  Hand Pain This is a new problem. The current episode started 1 to 2 hours ago. The problem occurs constantly. The problem has not changed since onset.Nothing aggravates the symptoms. Nothing relieves the symptoms. She has tried nothing for the symptoms. The treatment provided no relief.       Home Medications Prior to Admission medications   Medication Sig Start Date End Date Taking? Authorizing Provider  amLODipine (NORVASC) 5 MG tablet Take 5 mg by mouth daily. 02/09/22   [provider]  aspirin EC 81 MG tablet Take 81 mg by mouth daily.    [provider]  Cholecalciferol (VITAMIN D3) 1.25 MG (50000 UT) CAPS Take 1 capsule by mouth once a week. 01/14/22   [provider]  co-enzyme Q-10 30 MG capsule Take 30 mg by mouth daily.    [provider]  Continuous Blood Gluc Receiver ([place]) [place] by Does not apply route.    [provider]  diclofenac Sodium (VOLTAREN) 1 % GEL Apply 2 g topically 4 (four) times daily. 01/27/21   Carole Civil, MD  escitalopram (LEXAPRO) 5 MG tablet Take 10 mg by mouth at bedtime. 07/22/19   [provider]  ibuprofen (ADVIL) 800 MG tablet Take 800 mg by mouth every 6 (six) hours as needed for moderate pain.    [provider]  insulin aspart (NOVOLOG) 100 UNIT/ML injection 110u per day via insulin pump 06/22/21   Renato Shin, MD  Insulin Infusion Pump (T: SLIM X2 INS PMP/CONTROL 7.4) DEVI 1 Device  by Does not apply route every other day. This prescription is for T:slimX2 76m cartridges. 07/07/21   ERenato Shin MD  linaclotide ([facility] 145 MCG CAPS capsule Take 1 capsule (145 mcg total) by mouth daily before breakfast. 12/27/21   HErenest Rasher PA-C  LORazepam (ATIVAN) 0.5 MG tablet Take 0.5 mg by mouth 2 (two) times daily as needed for anxiety. 11/28/19   [provider]  meloxicam (MOBIC) 7.5 MG tablet Take 1 tablet (7.5 mg total) by mouth daily. 12/10/21   HCarole Civil MD  metoprolol tartrate (LOPRESSOR) 50 MG tablet Take 1 tablet (50 mg total) by mouth 2 (two) times daily. 03/31/22 06/29/22  Strader, BFransisco Hertz PA-C  ondansetron (ZOFRAN) 4 MG tablet Take 1 tablet (4 mg total) by mouth every 8 (eight) hours as needed for nausea or vomiting. 03/03/22   BHayden Rasmussen MD  OZEMPIC, 0.25 OR 0.5 MG/DOSE, 2 MG/3ML SOPN Inject 0.5 mg into the skin once a week. Patient not taking: Reported on 03/05/2022 01/13/22   [provider]  simvastatin (ZOCOR) 20 MG tablet Take 20 mg by mouth See admin instructions. Every Monday and Friday 10/19/19   [provider]      Allergies    Sulfonamide derivatives and Latex    Review of Systems   Review of Systems  All other systems reviewed and  are negative.   Physical Exam Updated Vital Signs BP 136/85 (BP Location: Right Arm)   Pulse 69   Temp 98.5 F (36.9 C) (Oral)   Resp 19   Ht '5\' 3"'$  (1.6 m)   Wt 92.1 kg   SpO2 95%   BMI 35.97 kg/m  Physical Exam Vitals reviewed.  Constitutional:      Appearance: Normal appearance.  Cardiovascular:     Rate and Rhythm: Normal rate.  Pulmonary:     Effort: Pulmonary effort is normal.  Musculoskeletal:        General: Swelling and tenderness present.     Comments: Tender left thumb, pain with range of motion,  nv and ns intact   Skin:    General: Skin is warm.  Neurological:     General: No focal deficit present.     Mental Status: She is alert.  Psychiatric:         Mood and Affect: Mood normal.     ED Results / Procedures / Treatments   Labs (all labs ordered are listed, but only abnormal results are displayed) Labs Reviewed - No data to display  EKG None  Radiology DG Hand Complete Left  Result Date: 05/09/2022 CLINICAL DATA:  Fall, pain and swelling. EXAM: LEFT HAND - COMPLETE 3+ VIEW COMPARISON:  None Available. FINDINGS: There is no evidence of fracture or dislocation. Occasional degenerative spurring of the digits. No erosive change. Soft tissues are unremarkable. IMPRESSION: No fracture or subluxation of the left hand. Electronically Signed   By: Keith Rake M.D.   On: 05/09/2022 20:45    Procedures Procedures    Medications Ordered in ED Medications - No data to display  ED Course/ Medical Decision Making/ A&P                           Medical Decision Making Pt complains of pain in her left hand after tripping and falling.  Pt denies any othe injuries   Amount and/or Complexity of Data Reviewed Radiology: ordered and independent interpretation performed. Decision-making details documented in ED Course.    Details: Xray  left hand  no fracture    Risk OTC drugs. Risk Details: Pt advised on xray results.  Pt advised to follow up with her primary care Md for recheck if pain persist past one week.            Final Clinical Impression(s) / ED Diagnoses Final diagnoses:  Contusion of left hand, initial encounter    Rx / DC Orders ED Discharge Orders     None      An After Visit Summary was printed and given to the patient.    Sidney Ace 05/09/22 2132    Noemi Chapel, MD 05/10/22 2236

## 2022-05-09 NOTE — ED Triage Notes (Signed)
Pov from home. Tripped over her dress and handed on her hand c/o left hand pain around the thumb. Hurts to move it.

## 2022-05-09 NOTE — ED Notes (Signed)
Discharge instructions reviewed with the patient. Patient had no questions or concerns to report. Patient discharged.  

## 2022-06-06 ENCOUNTER — Inpatient Hospital Stay (HOSPITAL_COMMUNITY): Admission: RE | Admit: 2022-06-06 | Payer: BC Managed Care – PPO | Source: Ambulatory Visit

## 2022-06-06 NOTE — Progress Notes (Unsigned)
Name: Sarah [name]  Age/ Sex: 61 y.o., female   MRN/ DOB: [id number], 1960/10/06     PCP: Celene Squibb, MD   Reason for Endocrinology Evaluation: Type 2 Diabetes Mellitus  Initial Endocrine Consultative Visit: 04/06/2021    PATIENT IDENTIFIER: Ms. Sarah [name] is a 61 y.o. female with a past medical history of DM, HTN, acute pyelonephritis . The patient has followed with Endocrinology clinic since 04/06/2021 for consultative assistance with management of her diabetes.  DIABETIC HISTORY:  Sarah [name] was diagnosed with DM 2002, and started insulin therapy in 2012. Her hemoglobin A1c has ranged from 9.4% in 2021, peaking at 12.4% in 2022.  Was on tandem   Tandem was started in 2022   She was followed by Dr. Loanne Drilling from August 2022 until October 2022  Ozempic was cost prohibitive    SUBJECTIVE:   During the last visit (06/15/2021): Saw Dr. Loanne Drilling  Today (06/07/2022): Sarah [name] is here for a follow up on diabetes management.  She checks her blood sugars 2x daily, she has not been consistently using the Dexcom due to cost issues.  She eats 3 meals a day she does snack as well Denies nausea, vomiting or diarrhea   She presented to the ED in July for UTI She has not been using the Ozempic due to cost issues despite using coupons  This patient with type 2 diabetes is treated with Omnipod (insulin pump). During the visit the pump basal and bolus doses were reviewed including carb/insulin rations and supplemental doses. The clinical list was updated. The glucose meter download was reviewed in detail to determine if the current pump settings are providing the best glycemic control without excessive hypoglycemia.  Pump and meter download:    Pump   Tandem  Settings   Insulin type   Novolog    Basal rate       0000 1.9 u/h               I:C ratio       0000 1:8                   Sensitivity       0000  25      Goal       0000  110             Type &  Model of Pump: Omnipod Insulin Type: Currently using novolog.  Body mass index is 34.9 kg/m.  PUMP STATISTICS: Average BG: 266  Average Daily Carbs (g): 0  Average Total Daily Insulin: 81.8  Average Daily Basal: 49 (70 %) Average Daily Bolus: 20 (29 %)   CONTINUOUS GLUCOSE MONITORING RECORD INTERPRETATION    Dates of Recording: 10/3-10/16/2023  Sensor description:dexcom  Results statistics:   CGM use % of time 61  Average  266  Time in range     21   %  % Time Above 180 79  % Time Below target 0   Glycemic patterns summary: Hyperglycemia noted during the day and BG's trend down overnight  Hyperglycemic episodes post prandial  Hypoglycemic episodes occurred N/A  Overnight periods: As high but trends down    HOME DIABETES REGIMEN:  Novolog  Ozempic 0.5 mg weekly -not taking  Statin: yes ACE-I/ARB: no Prior Diabetic Education: yes      DIABETIC COMPLICATIONS: Microvascular complications:  Neuropathy  Denies: CKD , retinopathy Last Eye Exam: Completed 2022  Macrovascular complications:   Denies: CAD, CVA, PVD  HISTORY:  Past Medical History:  Past Medical History:  Diagnosis Date   Anxiety    Diabetes mellitus    Diverticulosis    GERD (gastroesophageal reflux disease)    Hypercholesteremia    Hypertension    Past Surgical History:  Past Surgical History:  Procedure Laterality Date   ABDOMINAL HYSTERECTOMY     complete   CARDIAC CATHETERIZATION  2011   COLONOSCOPY  01/04/2010   left sided transverse diverticula/two diminutive rectal polyps   COLONOSCOPY  03/28/2012   Surgeon: Daneil Dolin, MD; riable anal canal with suspected trivial rectal bleeding from this location, colonic diverticulosis, s/p segmental resection, single diminutive polyp in the descending colon resected.  Pathology was benign.  Recommended 10-year repeat.   COLONOSCOPY N/A 09/11/2018   Surgeon: Aviva Signs, MD;   moderate diverticulosis in the descending colon,  otherwise normal exam.  Recommended 10-year repeat.   FOOT SURGERY     right-tendon repair   laparoscopic sigmoid colectomy  02/01/2010   Dr Jenkins-diverticulitis   TRIGGER FINGER RELEASE Left 03/2015   TRIGGER FINGER RELEASE Right 02/25/2016   Procedure: RIGHT LONG TRIGGER FINGER RELEASE;  Surgeon: Carole Civil, MD;  Location: [facility];  Service: Orthopedics;  Laterality: Right;   UMBILICAL HERNIA REPAIR N/A 02/05/2020   Procedure: UMBILICAL HERNIORRHAPY WITH MESH;  Surgeon: Aviva Signs, MD;  Location: [facility];  Service: General;  Laterality: N/A;   Social History:  reports that she quit smoking about 15 years ago. Her smoking use included cigarettes. She has a 10.00 pack-year smoking history. She has never used smokeless tobacco. She reports current alcohol use. She reports that she does not use drugs. Family History:  Family History  Problem Relation Age of Onset   Colon polyps Mother        in her 76s   Diabetes Mother    Hypertension Mother    Heart attack Father    Heart murmur Sister    Hypertension Brother    Colon cancer Maternal Grandfather        9   Diabetes Son    Liver disease Neg Hx      HOME MEDICATIONS: Allergies as of 06/07/2022       Reactions   Sulfonamide Derivatives Itching   Latex Itching, Rash, Other (See Comments)   Powdered only        Medication List        Accurate as of June 07, 2022 10:08 AM. If you have any questions, ask your nurse or doctor.          amLODipine 5 MG tablet Commonly known as: NORVASC Take 5 mg by mouth daily.   aspirin EC 81 MG tablet Take 81 mg by mouth daily.   co-enzyme Q-10 30 MG capsule Take 30 mg by mouth daily.   Dexcom G6 Receiver [place]i by Does not apply route.   diclofenac Sodium 1 % Gel Commonly known as: Voltaren Apply 2 g topically 4 (four) times daily.   escitalopram 5 MG tablet Commonly known as: LEXAPRO Take 10 mg by mouth at bedtime.   ibuprofen 800 MG tablet Commonly  known as: ADVIL Take 800 mg by mouth every 6 (six) hours as needed for moderate pain.   insulin aspart 100 UNIT/ML injection Commonly known as: novoLOG 110u per day via insulin pump   linaclotide 145 MCG Caps capsule Commonly known as: Linzess Take 1 capsule (145 mcg total) by mouth daily before breakfast.   LORazepam 0.5 MG tablet Commonly  known as: ATIVAN Take 0.5 mg by mouth 2 (two) times daily as needed for anxiety.   meloxicam 7.5 MG tablet Commonly known as: Mobic Take 1 tablet (7.5 mg total) by mouth daily.   metoprolol tartrate 50 MG tablet Commonly known as: LOPRESSOR Take 1 tablet (50 mg total) by mouth 2 (two) times daily.   ondansetron 4 MG tablet Commonly known as: ZOFRAN Take 1 tablet (4 mg total) by mouth every 8 (eight) hours as needed for nausea or vomiting.   Ozempic (0.25 or 0.5 MG/DOSE) 2 MG/3ML Sopn Generic drug: Semaglutide(0.25 or 0.'5MG'$ /DOS) Inject 0.5 mg into the skin once a week.   simvastatin 20 MG tablet Commonly known as: ZOCOR Take 20 mg by mouth See admin instructions. Every Monday and Friday   T: slim X2 Ins Pmp/Control 7.4 Devi 1 Device by Does not apply route every other day. This prescription is for T:slimX2 10m cartridges.   Vitamin D3 1.25 MG (50000 UT) Caps Take 1 capsule by mouth once a week.         OBJECTIVE:   Vital Signs: BP 126/78 (BP Location: Left Arm, Patient Position: Sitting, Cuff Size: Large)   Pulse 83   Ht '5\' 3"'$  (1.6 m)   Wt 197 lb (89.4 kg)   SpO2 95%   BMI 34.90 kg/m   Wt Readings from Last 3 Encounters:  06/07/22 197 lb (89.4 kg)  05/09/22 203 lb 0.7 oz (92.1 kg)  03/05/22 203 lb 0.7 oz (92.1 kg)     Exam: General: Pt appears well and is in NAD  Neck: General: Supple without adenopathy. Thyroid: Thyroid size normal.  No goiter or nodules appreciated.   Lungs: Clear with good BS bilat   Heart: RRR , + systolic murmur  Abdomen:  soft, nontender  Extremities: No pretibial edema.   Neuro: MS is  good with appropriate affect, pt is alert and Ox3    DM foot exam: 06/07/2022      The skin of the feet is intact without sores or ulcerations. The pedal pulses are 2+ on right and 2+ on left. The sensation is decreased  to a screening 5.07, 10 gram monofilament bilaterally at the heels     DATA REVIEWED:  Lab Results  Component Value Date   HGBA1C 12.4 (A) 05/11/2021   HGBA1C 11.9 (A) 04/06/2021   HGBA1C 9.4 (H) 02/03/2020    Latest Reference Range & Units 03/07/22 05:27  Sodium 135 - 145 mmol/L 141  Potassium 3.5 - 5.1 mmol/L 4.2  Chloride 98 - 111 mmol/L 106  CO2 22 - 32 mmol/L 26  Glucose 70 - 99 mg/dL 114 (H)  BUN 8 - 23 mg/dL 13  Creatinine 0.44 - 1.00 mg/dL 0.69  Calcium 8.9 - 10.3 mg/dL 8.7 (L)  Anion gap 5 - 15  9  Magnesium 1.7 - 2.4 mg/dL 2.0  GFR, Estimated >60 mL/min >60     ASSESSMENT / PLAN / RECOMMENDATIONS:   1) Type 2 Diabetes Mellitus, poorly controlled, With Neuropathic complications - Most recent A1c of 11.2 %. Goal A1c < 7.0 %.    - Poorly controlled diabetes  due to variable reasons  -Patient has not been entering any carbohydrates, hence she is not getting any prandial coverage, and the only cover she is getting other than the basal is with corrections -Patient is not able to count her carbohydrates, she will be given a set dose of carbohydrates to enter with meals as well as with snacks -I am  also going to increase her basal rate during the day -Discussed risk of microvascular complications to include blindness, CKD, and increased risk of amputations with ankle controlled diabetes -We will fax Dexcom to [place], to see if this will be a better price if his combined with the tandem pump -Ozempic is cost prohibitive despite coupon use -Not a candidate for SGLT2 inhibitors due to history of pyelonephritis, recent ED visit for UTI and she also developed cramps on Jardiance  MEDICATIONS: Novolog  Enter # 6 g with each meals, and # 3 grams with  snacks    Pump   Tandem  Settings   Insulin type   Novolog    Basal rate       0000 1.9 u/h    0800 2.0          I:C ratio       0000 1:1                  Sensitivity       0000  25      Goal       0000  110          EDUCATION / INSTRUCTIONS: BG monitoring instructions: Patient is instructed to check her blood sugars 3 times a day, before each meal . Call [facility] if: BG persistently < 70  I reviewed the Rule of 15 for the treatment of hypoglycemia in detail with the patient. Literature supplied.    2) Diabetic complications:  Eye: Does not have known diabetic retinopathy.  Neuro/ Feet: Does  have known diabetic peripheral neuropathy .  Renal: Patient does not have known baseline CKD. She   is not on an ACEI/ARB at present.      F/U in 4 months     Signed electronically by: Mack Guise, MD  [facility]  [facility] [address] [place], [place] [address] Phone: [phone] FAX: [phone]   CC: Celene Squibb, MD [place] [address] Phone: [phone]  Fax: [phone]  Return to Endocrinology clinic as below: Future Appointments  Date Time Provider [place]  06/07/2022 10:10 AM Davanta Meuser, Melanie Crazier, MD [facility] None  06/10/2022  3:00 PM Josue Hector, MD [facility]ene[facility]

## 2022-06-07 ENCOUNTER — Encounter: Payer: Self-pay | Admitting: Internal Medicine

## 2022-06-07 ENCOUNTER — Ambulatory Visit (INDEPENDENT_AMBULATORY_CARE_PROVIDER_SITE_OTHER): Payer: BC Managed Care – PPO | Admitting: Internal Medicine

## 2022-06-07 VITALS — BP 126/78 | HR 83 | Ht 63.0 in | Wt 197.0 lb

## 2022-06-07 DIAGNOSIS — E1142 Type 2 diabetes mellitus with diabetic polyneuropathy: Secondary | ICD-10-CM | POA: Diagnosis not present

## 2022-06-07 DIAGNOSIS — E1165 Type 2 diabetes mellitus with hyperglycemia: Secondary | ICD-10-CM | POA: Diagnosis not present

## 2022-06-07 DIAGNOSIS — Z794 Long term (current) use of insulin: Secondary | ICD-10-CM | POA: Diagnosis not present

## 2022-06-07 LAB — POCT GLYCOSYLATED HEMOGLOBIN (HGB A1C): Hemoglobin A1C: 11.2 % — AB (ref 4.0–5.6)

## 2022-06-07 MED ORDER — DEXCOM G6 SENSOR MISC: 1.0000 | 3 refills | Status: DC

## 2022-06-07 MED ORDER — DEXCOM G6 TRANSMITTER MISC: 1.0000 | 3 refills | Status: DC

## 2022-06-07 MED ORDER — INSULIN ASPART 100 UNIT/ML IJ SOLN: INTRAMUSCULAR | 3 refills | Status: DC

## 2022-06-07 NOTE — Progress Notes (Signed)
CARDIOLOGY CONSULT NOTE       Patient ID: Sarah [name] MRN: [id number] DOB/AGE: [birth year] 61 y.o.  Admit date: (Not on file) Referring Physician: [place] [name] Primary Physician: Celene Squibb, MD Primary Cardiologist: New prior Sarah [name] Leonides Sake Reason for Consultation: Arrythmia/MVD   HPI:  61 y.o. referred for continuity care Dr Sarah [name] Previously seen by Dr Sarah [name] and Sarah [name].  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 Has 3 grand kids she helps with ages 78-10 One is actually her nieces 4 yo boy and one is an adopted 3 yo with dextrocardia and congenital heart dx  ROS All other systems reviewed and negative except as noted above  Past Medical History:  Diagnosis Date   Anxiety    Diabetes mellitus    Diverticulosis    GERD (gastroesophageal reflux disease)    Hypercholesteremia    Hypertension     Family History  Problem Relation Age of Onset   Colon polyps Mother        in her 68s   Diabetes Mother    Hypertension Mother    Heart attack Father    Heart murmur Sister    Hypertension Brother    Colon cancer Maternal Grandfather        29   Diabetes Son    Liver disease Neg Hx     Social History   Socioeconomic History   Marital status: Married    Spouse name: Not on file   Number of children: 1   Years of education: Not on file   Highest education level: Not on file  Occupational History   Occupation: K Pharmacist, hospital asst    Employer: [employer]  Tobacco Use   Smoking status: Former    Packs/day: 1.00    Years: 10.00    Total pack years: 10.00    Types: Cigarettes    Quit date: 09/05/2006    Years since quitting: 15.7   Smokeless tobacco: Never  Vaping Use   Vaping Use: Never used  Substance and Sexual Activity   Alcohol use: Yes    Comment: occasional   Drug use: No   Sexual activity: Yes     Birth control/protection: Surgical    Comment: hyst  Other Topics Concern   Not on file  Social History Narrative   Lives w/ husband, sister & her children , teen mom & her baby   Social Determinants of Health   Financial Resource Strain: Not on file  Food Insecurity: Not on file  Transportation Needs: Not on file  Physical Activity: Not on file  Stress: Not on file  Social Connections: Not on file  Intimate Partner Violence: Not on file    Past Surgical History:  Procedure Laterality Date   ABDOMINAL HYSTERECTOMY     complete   CARDIAC CATHETERIZATION  2011   COLONOSCOPY  01/04/2010   left sided transverse diverticula/two diminutive rectal polyps   COLONOSCOPY  03/28/2012   Surgeon: Sarah Dolin, MD; riable anal canal with suspected trivial rectal bleeding from this location, colonic diverticulosis, s/p segmental resection, single diminutive polyp in the descending colon resected.  Pathology was benign.  Recommended 10-year repeat.   COLONOSCOPY N/A 09/11/2018   Surgeon: Sarah Signs, MD;   moderate diverticulosis in the descending colon, otherwise normal exam.  Recommended 10-year repeat.  FOOT SURGERY     right-tendon repair   laparoscopic sigmoid colectomy  02/01/2010   Dr Jenkins-diverticulitis   TRIGGER FINGER RELEASE Left 03/2015   TRIGGER FINGER RELEASE Right 02/25/2016   Procedure: RIGHT LONG TRIGGER FINGER RELEASE;  Surgeon: Carole Civil, MD;  Location: [facility];  Service: Orthopedics;  Laterality: Right;   UMBILICAL HERNIA REPAIR N/A 02/05/2020   Procedure: UMBILICAL HERNIORRHAPY WITH MESH;  Surgeon: Sarah Signs, MD;  Location: [facility];  Service: General;  Laterality: N/A;      Current Outpatient Medications:    amLODipine (NORVASC) 5 MG tablet, Take 5 mg by mouth daily., Disp: , Rfl:    aspirin EC 81 MG tablet, Take 81 mg by mouth daily., Disp: , Rfl:    Cholecalciferol (VITAMIN D3) 1.25 MG (50000 UT) CAPS, Take 1 capsule by mouth once a week., Disp: ,  Rfl:    co-enzyme Q-10 30 MG capsule, Take 30 mg by mouth daily., Disp: , Rfl:    Continuous Blood Gluc Sensor (DEXCOM G6 SENSOR) MISC, 1 Device by Does not apply route as directed., Disp: 9 each, Rfl: 3   Continuous Blood Gluc Transmit (DEXCOM G6 TRANSMITTER) MISC, 1 Device by Does not apply route as directed., Disp: 1 each, Rfl: 3   diclofenac Sodium (VOLTAREN) 1 % GEL, Apply 2 g topically 4 (four) times daily., Disp: 2 g, Rfl: 5   escitalopram (LEXAPRO) 5 MG tablet, Take 10 mg by mouth at bedtime., Disp: , Rfl:    ibuprofen (ADVIL) 800 MG tablet, Take 800 mg by mouth every 6 (six) hours as needed for moderate pain., Disp: , Rfl:    insulin aspart (NOVOLOG) 100 UNIT/ML injection, 110u per day via insulin pump, Disp: 10 mL, Rfl: 3   Insulin Infusion Pump (T: SLIM X2 INS PMP/CONTROL 7.4) DEVI, 1 Device by Does not apply route every other day. This prescription is for T:slimX2 32m cartridges., Disp: 45 each, Rfl: 3   linaclotide (LINZESS) 145 MCG CAPS capsule, Take 1 capsule (145 mcg total) by mouth daily before breakfast., Disp: 90 capsule, Rfl: 1   LORazepam (ATIVAN) 0.5 MG tablet, Take 0.5 mg by mouth 2 (two) times daily as needed for anxiety., Disp: , Rfl:    meloxicam (MOBIC) 7.5 MG tablet, Take 1 tablet (7.5 mg total) by mouth daily., Disp: 30 tablet, Rfl: 5   metoprolol tartrate (LOPRESSOR) 50 MG tablet, Take 1 tablet (50 mg total) by mouth 2 (two) times daily., Disp: 180 tablet, Rfl: 0   ondansetron (ZOFRAN) 4 MG tablet, Take 1 tablet (4 mg total) by mouth every 8 (eight) hours as needed for nausea or vomiting., Disp: 15 tablet, Rfl: 0   simvastatin (ZOCOR) 20 MG tablet, Take 20 mg by mouth See admin instructions. Every Monday and Friday, Disp: , Rfl:     Physical Exam: Blood pressure (!) 144/80, pulse 94, height '5\' 3"'$  (1.6 m), weight 200 lb 3.2 oz (90.8 kg), SpO2 98 %.    Affect appropriate Healthy:  appears stated age H55 normal Neck supple with no adenopathy JVP normal no bruits  no thyromegaly Lungs clear with no wheezing and good diaphragmatic motion Heart:  S1/S2 no murmur, no rub, gallop or click PMI normal Abdomen: benighn, BS positve, no tenderness, no AAA no bruit.  No HSM or HJR post hernia repair  Distal pulses intact with no bruits No edema Neuro non-focal Skin warm and dry No muscular weakness   Labs:   Lab Results  Component Value Date  WBC 8.5 03/07/2022   HGB 12.0 03/07/2022   HCT 34.4 (L) 03/07/2022   MCV 80.9 03/07/2022   PLT 291 03/07/2022   No results for input(s): "NA", "K", "CL", "CO2", "BUN", "CREATININE", "CALCIUM", "PROT", "BILITOT", "ALKPHOS", "ALT", "AST", "GLUCOSE" in the last 168 hours.  Invalid input(s): "LABALBU" Lab Results  Component Value Date   CKTOTAL 119 12/11/2010   CKMB 2.0 12/11/2010   TROPONINI <0.03 02/26/2018    Lab Results  Component Value Date   CHOL  12/11/2010    172        ATP III CLASSIFICATION:  <200     mg/dL   Desirable  200-239  mg/dL   Borderline High  >=240    mg/dL   High          Lab Results  Component Value Date   HDL 19 (L) 12/11/2010   Lab Results  Component Value Date   LDLCALC (H) 12/11/2010    126        Total Cholesterol/HDL:CHD Risk Coronary Heart Disease Risk Table                     Men   Women  1/2 Average Risk   3.4   3.3  Average Risk       5.0   4.4  2 X Average Risk   9.6   7.1  3 X Average Risk  23.4   11.0        Use the calculated Patient Ratio above and the CHD Risk Table to determine the patient's CHD Risk.        ATP III CLASSIFICATION (LDL):  <100     mg/dL   Optimal  100-129  mg/dL   Near or Above                    Optimal  130-159  mg/dL   Borderline  160-189  mg/dL   High  >190     mg/dL   Very High   Lab Results  Component Value Date   TRIG 137 12/11/2010   Lab Results  Component Value Date   CHOLHDL 9.1 12/11/2010   No results found for: "LDLDIRECT"    Radiology: No results found.  Prior Cardiac Studies Reviewed  NST:  04/2018 No diagnostic ST segment changes to indicate ischemia. Hypertensive blood pressure response. No chest pain was reported. There were no arrhythmias. Low risk Duke treadmill score of 5.5. Blood pressure demonstrated a hypertensive response to exercise. Small, mild intensity, apical anterior defect that is partially reversible, most likely reflective of variable breast attenuation. Increased TID ratio 1.27 looks to be due to misregistration of images. This is a low risk study. Nuclear stress EF: 74%.   Echocardiogram: 07/2018 Study Conclusions   - Left ventricle: The cavity size was normal. Wall thickness was   increased in a pattern of mild LVH. Systolic function was normal.   The estimated ejection fraction was in the range of 60% to 65%.   Wall motion was normal; there were no regional wall motion   abnormalities. Features are consistent with a pseudonormal left   ventricular filling pattern, with concomitant abnormal relaxation   and increased filling pressure (grade 2 diastolic dysfunction).   Doppler parameters are consistent with high ventricular filling   pressure. - Mitral valve: There was mild regurgitation.   Event Monitor: 06/2018 Predominantly sinus rhythm with sinus tachycardia also seen. 21 beat run of ventricular tachycardia, heart rate  189 bpm. Symptoms correlated with all of the above  EKG: SR rate 98 nonspecific ST changes 03/03/22    ASSESSMENT AND PLAN:   SVT/Aberrancy :  continue lopressor stable  MR:  mild by TTE 2019 update TTE  HLD:  on statin labs with primary  HTN:  continue norvasc and beta blocker  DM:  A1c 11.2 06/07/22  f/u endocrine [place] No SGLT2 due to obesity history of UTI"s and pyelonephritis Had cramps with Jardiance  CAD:  Risk stratification calcium score    TTE Coronary calcium score   F/U in a year   Signed: Jenkins Rouge 06/10/2022, 3:10 PM

## 2022-06-07 NOTE — Patient Instructions (Signed)
Enter 6 grams of carbohydrates with each meal  Enter 3 grams of carbohydrates with each snack    HOW TO TREAT LOW BLOOD SUGARS (Blood sugar LESS THAN 70 MG/DL) Please follow the RULE OF 15 for the treatment of hypoglycemia treatment (when your (blood sugars are less than 70 mg/dL)   STEP 1: Take 15 grams of carbohydrates when your blood sugar is low, which includes:  3-4 GLUCOSE TABS  OR 3-4 OZ OF JUICE OR REGULAR SODA OR ONE TUBE OF GLUCOSE GEL    STEP 2: RECHECK blood sugar in 15 MINUTES STEP 3: If your blood sugar is still low at the 15 minute recheck --> then, go back to STEP 1 and treat AGAIN with another 15 grams of carbohydrates.

## 2022-06-10 ENCOUNTER — Encounter: Payer: Self-pay | Admitting: Cardiovascular Disease

## 2022-06-10 ENCOUNTER — Ambulatory Visit: Payer: BC Managed Care – PPO | Attending: Cardiovascular Disease | Admitting: Cardiovascular Disease

## 2022-06-10 VITALS — BP 144/80 | HR 94 | Ht 63.0 in | Wt 200.2 lb

## 2022-06-10 DIAGNOSIS — I10 Essential (primary) hypertension: Secondary | ICD-10-CM

## 2022-06-10 DIAGNOSIS — R000 Tachycardia, unspecified: Secondary | ICD-10-CM

## 2022-06-10 DIAGNOSIS — I340 Nonrheumatic mitral (valve) insufficiency: Secondary | ICD-10-CM

## 2022-06-10 DIAGNOSIS — R011 Cardiac murmur, unspecified: Secondary | ICD-10-CM | POA: Diagnosis not present

## 2022-06-10 NOTE — Patient Instructions (Signed)
Medication Instructions:  Your physician recommends that you continue on your current medications as directed. Please refer to the Current Medication list given to you today.  *If you need a refill on your cardiac medications before your next appointment, please call your pharmacy*   Lab Work: NONE   If you have labs (blood work) drawn today and your tests are completely normal, you will receive your results only by: [place] (if you have MyChart) OR A paper copy in the mail If you have any lab test that is abnormal or we need to change your treatment, we will call you to review the results.   Testing/Procedures: Your physician has requested that you have an echocardiogram. Echocardiography is a painless test that uses sound waves to create images of your heart. It provides your doctor with information about the size and shape of your heart and how well your heart's chambers and valves are working. This procedure takes approximately one hour. There are no restrictions for this procedure. Please do NOT wear cologne, perfume, aftershave, or lotions (deodorant is allowed). Please arrive 15 minutes prior to your appointment time.  Calcium Score CT    Follow-Up: At [facility], you and your health needs are our priority.  As part of our continuing mission to provide you with exceptional heart care, we have created designated Provider Care Teams.  These Care Teams include your primary Cardiologist (physician) and Advanced Practice Providers (APPs -  Physician Assistants and Nurse Practitioners) who all work together to provide you with the care you need, when you need it.  We recommend signing up for the patient portal called "MyChart".  Sign up information is provided on this After Visit Summary.  MyChart is used to connect with patients for Virtual Visits (Telemedicine).  Patients are able to view lab/test results, encounter notes, upcoming appointments, etc.  Non-urgent  messages can be sent to your provider as well.   To learn more about what you can do with MyChart, go to [email or web address].    Your next appointment:   1 year(s)  The format for your next appointment:   In Person  Provider:   Jenkins Rouge, MD    Other Instructions Thank you for choosing [facility]!    Important Information About Sugar

## 2022-06-15 ENCOUNTER — Ambulatory Visit (HOSPITAL_COMMUNITY)
Admission: RE | Admit: 2022-06-15 | Discharge: 2022-06-15 | Disposition: A | Discharge status: Home / Self Care | Payer: BC Managed Care – PPO | Source: Ambulatory Visit | Attending: Internal Medicine | Admitting: Internal Medicine

## 2022-06-15 ENCOUNTER — Ambulatory Visit: Payer: BC Managed Care – PPO | Attending: Cardiovascular Disease

## 2022-06-15 DIAGNOSIS — I5030 Unspecified diastolic (congestive) heart failure: Secondary | ICD-10-CM | POA: Diagnosis not present

## 2022-06-15 DIAGNOSIS — I080 Rheumatic disorders of both mitral and aortic valves: Secondary | ICD-10-CM

## 2022-06-15 DIAGNOSIS — R011 Cardiac murmur, unspecified: Secondary | ICD-10-CM | POA: Diagnosis not present

## 2022-06-15 DIAGNOSIS — I517 Cardiomegaly: Secondary | ICD-10-CM | POA: Diagnosis not present

## 2022-06-15 DIAGNOSIS — Z1231 Encounter for screening mammogram for malignant neoplasm of breast: Secondary | ICD-10-CM | POA: Diagnosis present

## 2022-06-15 LAB — ECHOCARDIOGRAM COMPLETE
AR max vel: 1.49 cm2
AV Peak grad: 14.4 mmHg
Ao pk vel: 1.9 m/s
Area-P 1/2: 3.83 cm2
Calc EF: 66.1 %
MV M vel: 4.57 m/s
MV Peak grad: 83.4 mmHg
S' Lateral: 1.86 cm
Single Plane A2C EF: 64.7 %
Single Plane A4C EF: 66.8 %

## 2022-07-22 ENCOUNTER — Encounter (HOSPITAL_COMMUNITY): Payer: Self-pay

## 2022-07-22 ENCOUNTER — Ambulatory Visit (HOSPITAL_COMMUNITY): Payer: BC Managed Care – PPO

## 2022-07-22 ENCOUNTER — Telehealth: Payer: Self-pay | Admitting: Cardiology

## 2022-07-22 NOTE — Telephone Encounter (Signed)
Pt showed for her calcium scoring appointment and did not have the money to pay for it so she was not able to have it done. She was not aware of the charge and stated that she cannot pay that. She just wants Dr. Gilman Schmidt aware that she did not no show.

## 2022-08-25 ENCOUNTER — Other Ambulatory Visit: Payer: Self-pay

## 2022-08-25 MED ORDER — METOPROLOL TARTRATE 50 MG PO TABS: 50.0000 mg | ORAL_TABLET | Freq: Two times a day (BID) | ORAL | 0 refills | Status: AC

## 2022-08-25 NOTE — Telephone Encounter (Signed)
From: [name] To: Office of Erma Heritage, [place] Sent: 08/25/2022 1:39 PM EST Subject: Medication Renewal Request  Refills have been requested for the following medications:   metoprolol tartrate (LOPRESSOR) 50 MG tablet Sarah [name]]  Preferred pharmacy: [facility], [place] Delivery method: [employer]

## 2022-09-22 ENCOUNTER — Emergency Department (HOSPITAL_COMMUNITY): Payer: BC Managed Care – PPO

## 2022-09-22 ENCOUNTER — Other Ambulatory Visit: Payer: Self-pay

## 2022-09-22 ENCOUNTER — Emergency Department (HOSPITAL_COMMUNITY)
Admission: EM | Admit: 2022-09-22 | Discharge: 2022-09-22 | Disposition: A | Discharge status: Home / Self Care | Payer: BC Managed Care – PPO | Attending: Emergency Medicine | Admitting: Emergency Medicine

## 2022-09-22 ENCOUNTER — Encounter (HOSPITAL_COMMUNITY): Payer: Self-pay

## 2022-09-22 DIAGNOSIS — I10 Essential (primary) hypertension: Secondary | ICD-10-CM | POA: Insufficient documentation

## 2022-09-22 DIAGNOSIS — Z7982 Long term (current) use of aspirin: Secondary | ICD-10-CM | POA: Diagnosis not present

## 2022-09-22 DIAGNOSIS — S6991XA Unspecified injury of right wrist, hand and finger(s), initial encounter: Secondary | ICD-10-CM | POA: Diagnosis present

## 2022-09-22 DIAGNOSIS — Z79899 Other long term (current) drug therapy: Secondary | ICD-10-CM | POA: Insufficient documentation

## 2022-09-22 DIAGNOSIS — S63501A Unspecified sprain of right wrist, initial encounter: Secondary | ICD-10-CM | POA: Diagnosis not present

## 2022-09-22 DIAGNOSIS — Z91040 Latex allergy status: Secondary | ICD-10-CM | POA: Insufficient documentation

## 2022-09-22 DIAGNOSIS — Z794 Long term (current) use of insulin: Secondary | ICD-10-CM | POA: Insufficient documentation

## 2022-09-22 DIAGNOSIS — E119 Type 2 diabetes mellitus without complications: Secondary | ICD-10-CM | POA: Insufficient documentation

## 2022-09-22 DIAGNOSIS — W108XXA Fall (on) (from) other stairs and steps, initial encounter: Secondary | ICD-10-CM | POA: Diagnosis not present

## 2022-09-22 MED ORDER — ACETAMINOPHEN 500 MG PO TABS
1000.0000 mg | ORAL_TABLET | Freq: Once | ORAL | Status: AC
  Administered 2022-09-22: 1000 mg via ORAL
  Filled 2022-09-22: qty 2

## 2022-09-22 NOTE — ED Triage Notes (Signed)
Pt reports she slipped on the last step 3 days ago and injured her right wrist .

## 2022-09-22 NOTE — ED Provider Notes (Signed)
[facility] Provider Note   CSN: [id number] Arrival date & time: 09/22/22  2203     History  Chief Complaint  Patient presents with   Wrist Injury    Sarah [name] is a 62 y.o. female.  With a history of anxiety, hypertension, diabetes, hypercholesterolemia who presents the ED for evaluation of right wrist pain.  She states that she was working outside work 3 days ago when she believes she slipped on some ice.  She fell backwards onto her right outstretched hand.  She did not fall on any part of her body as she caught herself with her hand.  She did not hit her head or lose consciousness.  She does not take blood thinners.  She states that she had mild pain after the incident which has progressively gotten worse.  She has been taking ibuprofen as needed which does help the pain some.  Last dose was earlier this afternoon.  Currently complaining of moderate pain at rest which is worsened with movement of the wrist or grasping items.   Wrist Injury      Home Medications Prior to Admission medications   Medication Sig Start Date End Date Taking? Authorizing Provider  amLODipine (NORVASC) 5 MG tablet Take 5 mg by mouth daily. 02/09/22   [provider]  aspirin EC 81 MG tablet Take 81 mg by mouth daily.    [provider]  Cholecalciferol (VITAMIN D3) 1.25 MG (50000 UT) CAPS Take 1 capsule by mouth once a week. 01/14/22   [provider]  co-enzyme Q-10 30 MG capsule Take 30 mg by mouth daily.    [provider]  Continuous Blood Gluc Sensor (DEXCOM G6 SENSOR) MISC 1 Device by Does not apply route as directed. 06/07/22   Shamleffer, Melanie Crazier, MD  Continuous Blood Gluc Transmit (DEXCOM G6 TRANSMITTER) MISC 1 Device by Does not apply route as directed. 06/07/22   Shamleffer, Melanie Crazier, MD  diclofenac Sodium (VOLTAREN) 1 % GEL Apply 2 g topically 4 (four) times daily. 01/27/21   Carole Civil, MD   escitalopram (LEXAPRO) 5 MG tablet Take 10 mg by mouth at bedtime. 07/22/19   [provider]  ibuprofen (ADVIL) 800 MG tablet Take 800 mg by mouth every 6 (six) hours as needed for moderate pain.    [provider]  insulin aspart (NOVOLOG) 100 UNIT/ML injection 110u per day via insulin pump 06/07/22   Shamleffer, Melanie Crazier, MD  Insulin Infusion Pump (T: SLIM X2 INS PMP/CONTROL 7.4) DEVI 1 Device by Does not apply route every other day. This prescription is for T:slimX2 39m cartridges. 07/07/21   ERenato Shin MD  linaclotide ([facility] 145 MCG CAPS capsule Take 1 capsule (145 mcg total) by mouth daily before breakfast. 12/27/21   HErenest Rasher PA-C  LORazepam (ATIVAN) 0.5 MG tablet Take 0.5 mg by mouth 2 (two) times daily as needed for anxiety. 11/28/19   [provider]  meloxicam (MOBIC) 7.5 MG tablet Take 1 tablet (7.5 mg total) by mouth daily. 12/10/21   HCarole Civil MD  metoprolol tartrate (LOPRESSOR) 50 MG tablet Take 1 tablet (50 mg total) by mouth 2 (two) times daily. 08/25/22 11/23/22  NJosue Hector MD  ondansetron (ZOFRAN) 4 MG tablet Take 1 tablet (4 mg total) by mouth every 8 (eight) hours as needed for nausea or vomiting. 03/03/22   BHayden Rasmussen MD  simvastatin (ZOCOR) 20 MG tablet Take 20 mg  by mouth See admin instructions. Every Monday and Friday 10/19/19   [provider]      Allergies    Sulfonamide derivatives and Latex    Review of Systems   Review of Systems  Musculoskeletal:  Positive for arthralgias.  All other systems reviewed and are negative.   Physical Exam Updated Vital Signs BP (!) 156/75 (BP Location: Left Arm)   Pulse 89   Temp 97.9 F (36.6 C) (Oral)   Resp 16   Ht '5\' 3"'$  (1.6 m)   Wt 88.5 kg   SpO2 96%   BMI 34.54 kg/m  Physical Exam Vitals and nursing note reviewed.  Constitutional:      General: She is not in acute distress.    Appearance: Normal appearance. She is normal weight. She is not  ill-appearing.  HENT:     Head: Normocephalic and atraumatic.  Pulmonary:     Effort: Pulmonary effort is normal. No respiratory distress.  Abdominal:     General: Abdomen is flat.  Musculoskeletal:        General: Normal range of motion.     Cervical back: Neck supple.     Comments: Tenderness to palpation of the right wrist.  Anatomical snuffbox tenderness.  No tenderness to palpation of the hand, forearm or elbow.  Grip strength normal.  Capillary refill normal.  Sensation intact.  Radial pulse 2+. Anatomical snuffbox tenderness to palpation.  Skin:    General: Skin is warm and dry.  Neurological:     Mental Status: She is alert and oriented to person, place, and time.  Psychiatric:        Mood and Affect: Mood normal.        Behavior: Behavior normal.     ED Results / Procedures / Treatments   Labs (all labs ordered are listed, but only abnormal results are displayed) Labs Reviewed - No data to display  EKG None  Radiology DG Wrist Complete Right  Result Date: 09/22/2022 CLINICAL DATA:  Fall on outstretched hand with pain in right wrist EXAM: RIGHT WRIST - COMPLETE 3+ VIEW COMPARISON:  01/27/2021. FINDINGS: Tiny bony density is noted along the posterior aspect of the carpal bones on the lateral view, possible triquetral fracture. No dislocation. Mild degenerative changes are present in the first carpometacarpal joint and wrist. Soft tissues are unremarkable. IMPRESSION: Tiny bony density along the posterior aspect of the wrist, possible small triquetral fracture. Electronically Signed   By: Brett Fairy M.D.   On: 09/22/2022 22:46    Procedures Procedures    Medications Ordered in ED Medications  acetaminophen (TYLENOL) tablet 1,000 mg (1,000 mg Oral Given 09/22/22 2238)    ED Course/ Medical Decision Making/ A&P                             Medical Decision Making Amount and/or Complexity of Data Reviewed Radiology: ordered.  Risk OTC drugs.  This patient  presents to the ED for concern of right wrist pain, this involves an extensive number of treatment options, and is a complaint that carries with it a high risk of complications and morbidity.  The differential diagnosis includes fracture, strain, sprain, dislocation  Co morbidities that complicate the patient evaluation  nxiety, hypertension, diabetes, hypercholesterolemia  My initial workup includes Tylenol, x-ray right wrist  Additional history obtained from: Nursing notes from this visit.  I ordered imaging studies including x-ray right wrist I independently visualized and interpreted  imaging which showed possible tiny triquetral fracture I agree with the radiologist interpretation  Afebrile, hemodynamically stable.  62 year old female presenting to the ED for evaluation of right wrist pain after a fall 3 days ago.  On exam, she does have some tenderness to palpation of the wrist and anatomical snuffbox.  There is no obvious swelling or deformity.  Her neurovascular status is intact.  X-ray shows possible tiny triquetral fracture.  Patient was placed in a wrist splint with thumb abd and encouraged to follow-up with her primary care provider for repeat imaging in 1 week.  She is given information regarding appropriate dosing of Tylenol and ibuprofen at home.  She was given return precautions.  Stable discharge.  At this time there does not appear to be any evidence of an acute emergency medical condition and the patient appears stable for discharge with appropriate outpatient follow up. Diagnosis was discussed with patient who verbalizes understanding of care plan and is agreeable to discharge. I have discussed return precautions with patient who verbalizes understanding. Patient encouraged to follow-up with their PCP within 1 week. All questions answered.  Note: Portions of this report may have been transcribed using voice recognition software. Every effort was made to ensure accuracy; however,  inadvertent computerized transcription errors may still be present.        Final Clinical Impression(s) / ED Diagnoses Final diagnoses:  Sprain of right wrist, initial encounter    Rx / DC Orders ED Discharge Orders     None         Nehemiah Massed 09/22/22 2257    Davonna Belling, MD 09/22/22 2306

## 2022-09-22 NOTE — Discharge Instructions (Addendum)
You have been seen today for your complaint of right wrist pain. Your imaging  showed possible small fracture in your wrist, but more likely showed no abnormalities. Your discharge medications include Alternate tylenol and ibuprofen for pain. You may alternate these every 4 hours. You may take up to 800 mg of ibuprofen at a time and up to 1000 mg of tylenol. Home care instructions are as follows:  Ice the affected extremity multiple times a day.  Keep the wrist brace on until you follow-up with your primary care provider Follow up with: Your primary care provider in 1 week for reevaluation and repeat imaging Please seek immediate medical care if you develop any of the following symptoms: You have a new or sudden sharp pain in the hand, arm, or wrist. You have tingling or numbness in your hand. Your fingers turn white, very red, or cold and blue. You cannot move your fingers. At this time there does not appear to be the presence of an emergent medical condition, however there is always the potential for conditions to change. Please read and follow the below instructions.  Do not take your medicine if  develop an itchy rash, swelling in your mouth or lips, or difficulty breathing; call 911 and seek immediate emergency medical attention if this occurs.  You may review your lab tests and imaging results in their entirety on your MyChart account.  Please discuss all results of fully with your primary care provider and other specialist at your follow-up visit.  Note: Portions of this text may have been transcribed using voice recognition software. Every effort was made to ensure accuracy; however, inadvertent computerized transcription errors may still be present.

## 2022-10-14 ENCOUNTER — Encounter: Payer: Self-pay | Admitting: Internal Medicine

## 2022-10-14 ENCOUNTER — Ambulatory Visit (INDEPENDENT_AMBULATORY_CARE_PROVIDER_SITE_OTHER): Payer: BC Managed Care – PPO | Admitting: Internal Medicine

## 2022-10-14 VITALS — BP 130/80 | HR 88 | Ht 63.0 in | Wt 194.0 lb

## 2022-10-14 DIAGNOSIS — R350 Frequency of micturition: Secondary | ICD-10-CM

## 2022-10-14 DIAGNOSIS — E1142 Type 2 diabetes mellitus with diabetic polyneuropathy: Secondary | ICD-10-CM | POA: Diagnosis not present

## 2022-10-14 DIAGNOSIS — N3000 Acute cystitis without hematuria: Secondary | ICD-10-CM

## 2022-10-14 DIAGNOSIS — Z794 Long term (current) use of insulin: Secondary | ICD-10-CM

## 2022-10-14 DIAGNOSIS — E1165 Type 2 diabetes mellitus with hyperglycemia: Secondary | ICD-10-CM

## 2022-10-14 DIAGNOSIS — E119 Type 2 diabetes mellitus without complications: Secondary | ICD-10-CM

## 2022-10-14 LAB — POCT GLYCOSYLATED HEMOGLOBIN (HGB A1C): Hemoglobin A1C: 11.9 % — AB (ref 4.0–5.6)

## 2022-10-14 MED ORDER — TIRZEPATIDE 5 MG/0.5ML ~~LOC~~ SOAJ: 5.0000 mg | SUBCUTANEOUS | 3 refills | Status: DC

## 2022-10-14 MED ORDER — DEXCOM G7 SENSOR MISC: 1.0000 | 3 refills | Status: AC

## 2022-10-14 MED ORDER — INSULIN ASPART 100 UNIT/ML IJ SOLN: INTRAMUSCULAR | 3 refills | Status: DC

## 2022-10-14 NOTE — Progress Notes (Unsigned)
Name: Sarah [name]  Age/ Sex: 62 y.o., female   MRN/ DOB: [id number], 09/10/1960     PCP: Celene Squibb, MD   Reason for Endocrinology Evaluation: Type 2 Diabetes Mellitus  Initial Endocrine Consultative Visit: 04/06/2021    PATIENT IDENTIFIER: Sarah [name] is a 62 y.o. female with a past medical history of DM, HTN, acute pyelonephritis . The patient has followed with Endocrinology clinic since 04/06/2021 for consultative assistance with management of her diabetes.  DIABETIC HISTORY:  [name] was diagnosed with DM 2002, and started insulin therapy in 2012. Her hemoglobin A1c has ranged from 9.4% in 2021, peaking at 12.4% in 2022.  Was on tandem   Tandem was started in 2022   She was followed by Dr. Loanne Drilling from August 2022 until October 2022  Ozempic was cost prohibitive    SUBJECTIVE:   During the last visit (06/07/2022): A1c 11.7%     Today (10/14/2022): Ms. [name] is here for a follow up on diabetes management.  She checks her blood sugars 2x daily, she has not been consistently using the Dexcom due to cost issues.  Had a follow up with cardiology 06/10/2022 She sustained a fall 09/22/2022 Denies nausea, vomiting or diarrhea  Has noted fishy smell to her urine with darkening but no flank pain or fever , noted frequency worse at night     This patient with type 2 diabetes is treated with Tandem  (insulin pump). During the visit the pump basal and bolus doses were reviewed including carb/insulin rations and supplemental doses. The clinical list was updated. The glucose meter download was reviewed in detail to determine if the current pump settings are providing the best glycemic control without excessive hypoglycemia.  Pump and meter download:    Pump   Tandem  Settings   Insulin type   Novolog    Basal rate       0000 1.9 u/h    0800 2.00          I:C ratio       0000 1:1                  Sensitivity       0000  25      Goal       0000  110              Type & Model of Pump: Omnipod Insulin Type: Currently using novolog.  Body mass index is 34.37 kg/m.  PUMP STATISTICS: Average BG: 363 Average Daily Carbs (g): 0 9 Average Total Daily Insulin: 67.23 Average Daily Basal: 43 (60 %) Average Daily Bolus: 28 (40 %)   CONTINUOUS GLUCOSE MONITORING RECORD INTERPRETATION    Dates of Recording: 2/9-2/22/2024  Sensor description:dexcom  Results statistics:   CGM use % of time 15  Average  272  Time in range   18 %  % Time Above 180 82  % Time Below target 0   Glycemic patterns summary: Hyperglycemia noted during the day and night   Hyperglycemic episodes post prandial  Hypoglycemic episodes occurred N/A  Overnight periods: trends down     HOME DIABETES REGIMEN:  Novolog    Statin: yes ACE-I/ARB: no Prior Diabetic Education: yes      DIABETIC COMPLICATIONS: Microvascular complications:  Neuropathy  Denies: CKD , retinopathy Last Eye Exam: Completed 2022  Macrovascular complications:   Denies: CAD, CVA, PVD   HISTORY:  Past Medical History:  Past Medical History:  Diagnosis Date   Anxiety    Diabetes mellitus    Diverticulosis    GERD (gastroesophageal reflux disease)    Hypercholesteremia    Hypertension    Past Surgical History:  Past Surgical History:  Procedure Laterality Date   ABDOMINAL HYSTERECTOMY     complete   CARDIAC CATHETERIZATION  2011   COLONOSCOPY  01/04/2010   left sided transverse diverticula/two diminutive rectal polyps   COLONOSCOPY  03/28/2012   Surgeon: Daneil Dolin, MD; riable anal canal with suspected trivial rectal bleeding from this location, colonic diverticulosis, s/p segmental resection, single diminutive polyp in the descending colon resected.  Pathology was benign.  Recommended 10-year repeat.   COLONOSCOPY N/A 09/11/2018   Surgeon: Aviva Signs, MD;   moderate diverticulosis in the descending colon, otherwise normal exam.  Recommended 10-year  repeat.   FOOT SURGERY     right-tendon repair   laparoscopic sigmoid colectomy  02/01/2010   Dr Jenkins-diverticulitis   TRIGGER FINGER RELEASE Left 03/2015   TRIGGER FINGER RELEASE Right 02/25/2016   Procedure: RIGHT LONG TRIGGER FINGER RELEASE;  Surgeon: Carole Civil, MD;  Location: [facility];  Service: Orthopedics;  Laterality: Right;   UMBILICAL HERNIA REPAIR N/A 02/05/2020   Procedure: UMBILICAL HERNIORRHAPY WITH MESH;  Surgeon: Aviva Signs, MD;  Location: [facility];  Service: General;  Laterality: N/A;   Social History:  reports that she quit smoking about 16 years ago. Her smoking use included cigarettes. She has a 10.00 pack-year smoking history. She has never used smokeless tobacco. She reports that she does not currently use alcohol. She reports that she does not use drugs. Family History:  Family History  Problem Relation Age of Onset   Colon polyps Mother        in her 62s   Diabetes Mother    Hypertension Mother    Heart attack Father    Heart murmur Sister    Hypertension Brother    Colon cancer Maternal Grandfather        50   Diabetes Son    Liver disease Neg Hx      HOME MEDICATIONS: Allergies as of 10/14/2022       Reactions   Sulfonamide Derivatives Itching   Latex Itching, Rash, Other (See Comments)   Powdered only        Medication List        Accurate as of October 14, 2022  9:20 AM. If you have any questions, ask your nurse or doctor.          amLODipine 5 MG tablet Commonly known as: NORVASC Take 5 mg by mouth daily.   amoxicillin 500 MG capsule Commonly known as: AMOXIL Take 500 mg by mouth every 8 (eight) hours.   aspirin EC 81 MG tablet Take 81 mg by mouth daily.   co-enzyme Q-10 30 MG capsule Take 30 mg by mouth daily.   Dexcom G6 Sensor Misc 1 Device by Does not apply route as directed.   Dexcom G6 Transmitter Misc 1 Device by Does not apply route as directed.   diclofenac Sodium 1 % Gel Commonly known as:  Voltaren Apply 2 g topically 4 (four) times daily.   escitalopram 5 MG tablet Commonly known as: LEXAPRO Take 10 mg by mouth at bedtime.   ibuprofen 800 MG tablet Commonly known as: ADVIL Take 800 mg by mouth every 6 (six) hours as needed for moderate pain.   insulin aspart 100 UNIT/ML injection Commonly known as: novoLOG 110u per  day via insulin pump   linaclotide 145 MCG Caps capsule Commonly known as: Linzess Take 1 capsule (145 mcg total) by mouth daily before breakfast.   LORazepam 0.5 MG tablet Commonly known as: ATIVAN Take 0.5 mg by mouth 2 (two) times daily as needed for anxiety.   meloxicam 7.5 MG tablet Commonly known as: Mobic Take 1 tablet (7.5 mg total) by mouth daily.   metoprolol tartrate 50 MG tablet Commonly known as: LOPRESSOR Take 1 tablet (50 mg total) by mouth 2 (two) times daily.   ondansetron 4 MG tablet Commonly known as: ZOFRAN Take 1 tablet (4 mg total) by mouth every 8 (eight) hours as needed for nausea or vomiting.   simvastatin 20 MG tablet Commonly known as: ZOCOR Take 20 mg by mouth See admin instructions. Every Monday and Friday   T: slim X2 Ins Pmp/Control 7.4 Devi 1 Device by Does not apply route every other day. This prescription is for T:slimX2 36m cartridges.   Vitamin D3 1.25 MG (50000 UT) capsule Generic drug: Cholecalciferol Take 1 capsule by mouth once a week.         OBJECTIVE:   Vital Signs: BP 130/80 (BP Location: Right Arm, Patient Position: Sitting, Cuff Size: Large)   Pulse 88   Ht '5\' 3"'$  (1.6 m)   Wt 194 lb (88 kg)   SpO2 96%   BMI 34.37 kg/m   Wt Readings from Last 3 Encounters:  10/14/22 194 lb (88 kg)  09/22/22 195 lb (88.5 kg)  06/10/22 200 lb 3.2 oz (90.8 kg)     Exam: General: Pt appears well and is in NAD  Lungs: Clear with good BS bilat   Heart: RRR , + systolic murmur  Extremities: No pretibial edema.   Neuro: MS is good with appropriate affect, pt is alert and Ox3    DM foot exam:  06/07/2022      The skin of the feet is intact without sores or ulcerations. The pedal pulses are 2+ on right and 2+ on left. The sensation is decreased  to a screening 5.07, 10 gram monofilament bilaterally at the heels     DATA REVIEWED:  Lab Results  Component Value Date   HGBA1C 11.9 (A) 10/14/2022   HGBA1C 11.2 (A) 06/07/2022   HGBA1C 12.4 (A) 05/11/2021    Latest Reference Range & Units 03/07/22 05:27  Sodium 135 - 145 mmol/L 141  Potassium 3.5 - 5.1 mmol/L 4.2  Chloride 98 - 111 mmol/L 106  CO2 22 - 32 mmol/L 26  Glucose 70 - 99 mg/dL 114 (H)  BUN 8 - 23 mg/dL 13  Creatinine 0.44 - 1.00 mg/dL 0.69  Calcium 8.9 - 10.3 mg/dL 8.7 (L)  Anion gap 5 - 15  9  Magnesium 1.7 - 2.4 mg/dL 2.0  GFR, Estimated >60 mL/min >60     ASSESSMENT / PLAN / RECOMMENDATIONS:   1) Type 2 Diabetes Mellitus, poorly controlled, With Neuropathic complications - Most recent A1c of 11.9 %. Goal A1c < 7.0 %.    - Poorly controlled diabetes  due to variable reasons  -Ozempic is cost prohibitive despite coupon use -Not a candidate for SGLT2 inhibitors due to history of pyelonephritis, she also developed cramps on Jardiance - Intrested in [place] provided with #4 pen samples and coupon  - I have increased her basal rate, and SF  - She was advised to enter # 10 with meals and  #5 grams with snacks   MEDICATIONS: NYouth worker#  6 g with each meals, and # 3 grams with snacks    Pump   Tandem  Settings   Insulin type   Novolog    Basal rate       0000 2.2 u/h    0800 2.4          I:C ratio       0000 1:1 enter#10 with meals and# 5 g with snacks                   Sensitivity       0000  20      Goal       0000  110          EDUCATION / INSTRUCTIONS: BG monitoring instructions: Patient is instructed to check her blood sugars 3 times a day, before each meal . Call [place] [facility] if: BG persistently < 70  I reviewed the Rule of 15 for the treatment of  hypoglycemia in detail with the patient. Literature supplied.    2) Diabetic complications:  Eye: Does not have known diabetic retinopathy.  Neuro/ Feet: Does  have known diabetic peripheral neuropathy .  Renal: Patient does not have known baseline CKD. She   is not on an ACEI/ARB at present.    3) Urinary Frequency :  - UA***   F/U in 4 months     Signed electronically by: Mack Guise, MD  [facility] Endocrin[facility]  [facility] [address] [place], [place] [address] Phone: [phone] FAX: [phone]   CC: Celene Squibb, MD [place] [address] Phone: [phone]  Fax: [phone]  Return to Endocrinology clinic as below: No future appointments.

## 2022-10-14 NOTE — Patient Instructions (Addendum)
Start Mounjaro 2.5 mg ONCE weekly for a month, than increase to 5 mg weekly    HOW TO TREAT LOW BLOOD SUGARS (Blood sugar LESS THAN 70 MG/DL) Please follow the RULE OF 15 for the treatment of hypoglycemia treatment (when your (blood sugars are less than 70 mg/dL)   STEP 1: Take 15 grams of carbohydrates when your blood sugar is low, which includes:  3-4 GLUCOSE TABS  OR 3-4 OZ OF JUICE OR REGULAR SODA OR ONE TUBE OF GLUCOSE GEL    STEP 2: RECHECK blood sugar in 15 MINUTES STEP 3: If your blood sugar is still low at the 15 minute recheck --> then, go back to STEP 1 and treat AGAIN with another 15 grams of carbohydrates.

## 2022-10-15 LAB — TIQ- MISLABELED
DATE RECEIVED:: 2232024
Test Ordered On Req: 3020

## 2022-10-17 ENCOUNTER — Telehealth: Payer: Self-pay

## 2022-10-17 MED ORDER — CIPROFLOXACIN HCL 500 MG PO TABS: 500.0000 mg | ORAL_TABLET | Freq: Two times a day (BID) | ORAL | 0 refills | Status: AC

## 2022-10-17 NOTE — Telephone Encounter (Signed)
Patient advised that antibiotic was sent into pharmacy base on urine results showing that she has a UTI.

## 2022-10-20 ENCOUNTER — Encounter: Payer: Self-pay | Admitting: Radiology

## 2022-10-21 LAB — URINALYSIS W MICROSCOPIC + REFLEX CULTURE
Bilirubin Urine: NEGATIVE
Hgb urine dipstick: NEGATIVE
Nitrites, Initial: NEGATIVE
Protein, ur: NEGATIVE
RBC / HPF: NONE SEEN /HPF (ref 0–2)
Specific Gravity, Urine: 1.026 (ref 1.001–1.035)
WBC, UA: >=60 /HPF — AB (ref 0–5)
pH: 5.5 (ref 5.0–8.0)

## 2022-10-21 LAB — URINE CULTURE
MICRO NUMBER:: 14608781
SPECIMEN QUALITY:: ADEQUATE

## 2022-10-21 LAB — PAT ID TIQ DOC: Test Affected: 395

## 2022-10-21 LAB — CULTURE INDICATED

## 2022-11-28 ENCOUNTER — Telehealth: Payer: Self-pay

## 2022-11-28 ENCOUNTER — Other Ambulatory Visit: Payer: Self-pay

## 2022-11-28 DIAGNOSIS — E119 Type 2 diabetes mellitus without complications: Secondary | ICD-10-CM

## 2022-11-28 MED ORDER — INSULIN ASPART 100 UNIT/ML IJ SOLN: INTRAMUSCULAR | 3 refills | Status: DC

## 2022-11-28 NOTE — Telephone Encounter (Signed)
Spoke with pharmacy and they will process for a 90 day supply next fill

## 2022-11-28 NOTE — Telephone Encounter (Signed)
Patient states that the Novolog directions need to be changed to 200u per day in order for them to give her the 10 vials. She says the 100u is for the old pump not the Tandem

## 2023-02-02 ENCOUNTER — Ambulatory Visit: Payer: BC Managed Care – PPO | Admitting: Internal Medicine

## 2023-03-21 ENCOUNTER — Encounter: Payer: Self-pay | Admitting: Internal Medicine

## 2023-03-21 ENCOUNTER — Ambulatory Visit: Payer: BC Managed Care – PPO | Admitting: Internal Medicine

## 2023-03-21 VITALS — BP 120/74 | HR 89 | Ht 63.0 in | Wt 193.0 lb

## 2023-03-21 DIAGNOSIS — Z794 Long term (current) use of insulin: Secondary | ICD-10-CM | POA: Diagnosis not present

## 2023-03-21 DIAGNOSIS — E1142 Type 2 diabetes mellitus with diabetic polyneuropathy: Secondary | ICD-10-CM

## 2023-03-21 DIAGNOSIS — E1165 Type 2 diabetes mellitus with hyperglycemia: Secondary | ICD-10-CM

## 2023-03-21 DIAGNOSIS — Z4681 Encounter for fitting and adjustment of insulin pump: Secondary | ICD-10-CM | POA: Diagnosis not present

## 2023-03-21 LAB — POCT GLYCOSYLATED HEMOGLOBIN (HGB A1C): Hemoglobin A1C: 9.6 % — AB (ref 4.0–5.6)

## 2023-03-21 LAB — POCT GLUCOSE (DEVICE FOR HOME USE): POC Glucose: 119 mg/dl — AB (ref 70–99)

## 2023-03-21 MED ORDER — TIRZEPATIDE 7.5 MG/0.5ML ~~LOC~~ SOAJ: 7.5000 mg | SUBCUTANEOUS | 3 refills | Status: DC

## 2023-03-21 NOTE — Progress Notes (Signed)
Name: Sarah [name]  Age/ Sex: 62 y.o., female   MRN/ DOB: [id number], [birth year]     PCP: Benita Stabile, MD   Reason for Endocrinology Evaluation: Type 2 Diabetes Mellitus  Initial Endocrine Consultative Visit: 04/06/2021    PATIENT IDENTIFIER: Sarah [name] is a 62 y.o. female with a past medical history of DM, HTN, acute pyelonephritis . The patient has followed with Endocrinology clinic since 04/06/2021 for consultative assistance with management of her diabetes.  DIABETIC HISTORY:  [name] was diagnosed with DM 2002, and started insulin therapy in 2012. Her hemoglobin A1c has ranged from 9.4% in 2021, peaking at 12.4% in 2022.  Was on tandem   Tandem was started in 2022   She was followed by Dr. Everardo All from August 2022 until October 2022  Ozempic was cost prohibitive    SUBJECTIVE:   During the last visit (10/14/2022): A1c 11.9%     Today (03/21/2023): [name] is here for a follow up on diabetes management.  She checks her blood sugars 2x daily, she has not been consistently using the Dexcom due to cost issues.   Denies recent nausea or vomiting  Has constipation that she attributes to [facility]   This patient with type 2 diabetes is treated with Tandem  (insulin pump). During the visit the pump basal and bolus doses were reviewed including carb/insulin rations and supplemental doses. The clinical list was updated. The glucose meter download was reviewed in detail to determine if the current pump settings are providing the best glycemic control without excessive hypoglycemia.  Pump and meter download:    Pump   Tandem  Settings   Insulin type   Novolog    Basal rate       0000 2.2 u/h    0800 2.4          I:C ratio       0000 1:1    #10/#5              Sensitivity       0000  20      Goal       0000  110             Type & Model of Pump: Omnipod Insulin Type: Currently using novolog.  Body mass index is 34.19 kg/m.  PUMP  STATISTICS: Average BG:232 Average Daily Carbs (g): 15 Average Total Daily Insulin: 70.08 Average Daily Basal:33 (59 %) Average Daily Bolus: 23 (41 %)   CONTINUOUS GLUCOSE MONITORING RECORD INTERPRETATION    Dates of Recording:7/17-7/30/2024  Sensor description:dexcom  Results statistics:   CGM use % of time 41  Average  208/63  Time in range  30 %  % Time Above 180 70  % Time Below target 0   Glycemic patterns summary: Hyperglycemia has been noted during the day, with variability overnight  Hyperglycemic episodes post prandial  Hypoglycemic episodes occurred N/A  Overnight periods: Variable without pattern    HOME DIABETES REGIMEN:  Novolog  Mounjaro 5 mg weekly  Statin: yes ACE-I/ARB: no Prior Diabetic Education: yes      DIABETIC COMPLICATIONS: Microvascular complications:  Neuropathy  Denies: CKD , retinopathy Last Eye Exam: Completed 2022  Macrovascular complications:   Denies: CAD, CVA, PVD   HISTORY:  Past Medical History:  Past Medical History:  Diagnosis Date   Anxiety    Diabetes mellitus    Diverticulosis    GERD (gastroesophageal reflux disease)    Hypercholesteremia  Hypertension    Past Surgical History:  Past Surgical History:  Procedure Laterality Date   ABDOMINAL HYSTERECTOMY     complete   CARDIAC CATHETERIZATION  2011   COLONOSCOPY  01/04/2010   left sided transverse diverticula/two diminutive rectal polyps   COLONOSCOPY  03/28/2012   Surgeon: Corbin Ade, MD; riable anal canal with suspected trivial rectal bleeding from this location, colonic diverticulosis, s/p segmental resection, single diminutive polyp in the descending colon resected.  Pathology was benign.  Recommended 10-year repeat.   COLONOSCOPY N/A 09/11/2018   Surgeon: Franky Macho, MD;   moderate diverticulosis in the descending colon, otherwise normal exam.  Recommended 10-year repeat.   FOOT SURGERY     right-tendon repair   laparoscopic sigmoid  colectomy  02/01/2010   Dr Jenkins-diverticulitis   TRIGGER FINGER RELEASE Left 03/2015   TRIGGER FINGER RELEASE Right 02/25/2016   Procedure: RIGHT LONG TRIGGER FINGER RELEASE;  Surgeon: Vickki Hearing, MD;  Location: [facility];  Service: Orthopedics;  Laterality: Right;   UMBILICAL HERNIA REPAIR N/A 02/05/2020   Procedure: UMBILICAL HERNIORRHAPY WITH MESH;  Surgeon: Franky Macho, MD;  Location: [facility];  Service: General;  Laterality: N/A;   Social History:  reports that she quit smoking about 16 years ago. Her smoking use included cigarettes. She started smoking about 26 years ago. She has a 10 pack-year smoking history. She has never used smokeless tobacco. She reports that she does not currently use alcohol. She reports that she does not use drugs. Family History:  Family History  Problem Relation Age of Onset   Colon polyps Mother        in her 48s   Diabetes Mother    Hypertension Mother    Heart attack Father    Heart murmur Sister    Hypertension Brother    Colon cancer Maternal Grandfather        33   Diabetes Son    Liver disease Neg Hx      HOME MEDICATIONS: Allergies as of 03/21/2023       Reactions   Sulfonamide Derivatives Itching   Latex Itching, Rash, Other (See Comments)   Powdered only        Medication List        Accurate as of March 21, 2023  2:57 PM. If you have any questions, ask your nurse or doctor.          STOP taking these medications    amoxicillin 500 MG capsule Commonly known as: AMOXIL Stopped by: Johnney Ou Abiola Behring   tirzepatide 5 MG/0.5ML Pen Commonly known as: MOUNJARO Replaced by: tirzepatide 7.5 MG/0.5ML Pen Stopped by: Johnney Ou Danilyn Cocke       TAKE these medications    amLODipine 5 MG tablet Commonly known as: NORVASC Take 5 mg by mouth daily.   aspirin EC 81 MG tablet Take 81 mg by mouth daily.   co-enzyme Q-10 30 MG capsule Take 30 mg by mouth daily.   Dexcom G6 Transmitter Misc 1 Device by Does not  apply route as directed.   Dexcom G7 Sensor Misc 1 Device by Does not apply route as directed.   diclofenac Sodium 1 % Gel Commonly known as: Voltaren Apply 2 g topically 4 (four) times daily.   escitalopram 5 MG tablet Commonly known as: LEXAPRO Take 10 mg by mouth at bedtime.   gabapentin 100 MG capsule Commonly known as: NEURONTIN Take 100 mg by mouth 3 (three) times daily.   Gvoke PFS 1  MG/0.2ML Sosy Generic drug: Glucagon SMARTSIG:1 Milligram(s) SUB-Q   ibuprofen 800 MG tablet Commonly known as: ADVIL Take 800 mg by mouth every 6 (six) hours as needed for moderate pain.   insulin aspart 100 UNIT/ML injection Commonly known as: novoLOG 100u per day via insulin pump   linaclotide 145 MCG Caps capsule Commonly known as: Linzess Take 1 capsule (145 mcg total) by mouth daily before breakfast.   LORazepam 0.5 MG tablet Commonly known as: ATIVAN Take 0.5 mg by mouth 2 (two) times daily as needed for anxiety.   meloxicam 7.5 MG tablet Commonly known as: Mobic Take 1 tablet (7.5 mg total) by mouth daily.   methocarbamol 750 MG tablet Commonly known as: ROBAXIN Take 750 mg by mouth 3 (three) times daily as needed.   metoprolol tartrate 50 MG tablet Commonly known as: LOPRESSOR Take 1 tablet (50 mg total) by mouth 2 (two) times daily.   ondansetron 4 MG tablet Commonly known as: ZOFRAN Take 1 tablet (4 mg total) by mouth every 8 (eight) hours as needed for nausea or vomiting.   simvastatin 20 MG tablet Commonly known as: ZOCOR Take 20 mg by mouth See admin instructions. Every Monday and Friday   T: slim X2 Ins Pmp/Control 7.4 Devi 1 Device by Does not apply route every other day. This prescription is for T:slimX2 3ml cartridges.   tirzepatide 7.5 MG/0.5ML Pen Commonly known as: MOUNJARO Inject 7.5 mg into the skin once a week. Replaces: tirzepatide 5 MG/0.5ML Pen Started by: Scarlette Shorts   Vitamin D3 1.25 MG (50000 UT) Caps Take 1 capsule by mouth  once a week.         OBJECTIVE:   Vital Signs: BP 120/74 (BP Location: Left Arm, Patient Position: Sitting, Cuff Size: Large)   Pulse 89   Ht 5\' 3"  (1.6 m)   Wt 193 lb (87.5 kg)   SpO2 99%   BMI 34.19 kg/m   Wt Readings from Last 3 Encounters:  03/21/23 193 lb (87.5 kg)  10/14/22 194 lb (88 kg)  09/22/22 195 lb (88.5 kg)     Exam: General: Pt appears well and is in NAD  Lungs: Clear with good BS bilat   Heart: RRR , + systolic murmur  Extremities: No pretibial edema.   Neuro: MS is good with appropriate affect, pt is alert and Ox3    DM foot exam: 03/21/2023      The skin of the feet is intact without sores or ulcerations. The pedal pulses are 2+ on right and 2+ on left. The sensation is decreased  to a screening 5.07, 10 gram monofilament bilaterally at the heels     DATA REVIEWED:  Lab Results  Component Value Date   HGBA1C 9.6 (A) 03/21/2023   HGBA1C 11.9 (A) 10/14/2022   HGBA1C 11.2 (A) 06/07/2022      ASSESSMENT / PLAN / RECOMMENDATIONS:   1) Type 2 Diabetes Mellitus, poorly controlled, With Neuropathic complications - Most recent A1c of 9.6 %. Goal A1c < 7.0 %.    -A1c has trended down from 11.9% to 9.6% -Patient tolerating Mounjaro, we will increase as below -I have increased her basal rate as below -Patient encouraged to continue to enter number 10 g with a meal and number 5 g with a snack as she has not been consistently doing this -Not a candidate for SGLT2 inhibitors due to history of pyelonephritis, she also developed cramps on Jardiance - She had recent labs at PCP, these records  are not available  MEDICATIONS: Novolog  Increase Mounjaro 7.5 mg weekly Enter # 10 g with each meals, and # 5 grams with snacks    Pump   Tandem  Settings   Insulin type   Novolog    Basal rate       0000 2.4 u/h    0800 2.6          I:C ratio       0000 1:1 enter#10 with meals and# 5 g with snacks                   Sensitivity       0000  20       Goal       0000  110          EDUCATION / INSTRUCTIONS: BG monitoring instructions: Patient is instructed to check her blood sugars 3 times a day, before each meal . Call [facility] if: BG persistently < 70  I reviewed the Rule of 15 for the treatment of hypoglycemia in detail with the patient. Literature supplied.    2) Diabetic complications:  Eye: Does not have known diabetic retinopathy.  Neuro/ Feet: Does  have known diabetic peripheral neuropathy .  Renal: Patient does not have known baseline CKD. She   is not on an ACEI/ARB at present.      F/U in 6 months     Signed electronically by: Lyndle Herrlich, MD  [facility]  [facility] [address] [place] [place], [place] [address] Phone: [phone] FAX: [phone]   CC: Benita Stabile, MD [address] [place] [address] Phone: [phone]  Fax: [phone]  Return to Endocrinology clinic as below: Future Appointments  Date Time Provider Department Center  09/21/2023 10:30 AM Ireanna Finlayson, Konrad Dolores, MD [id number] None

## 2023-03-21 NOTE — Patient Instructions (Addendum)
Get skin-TAC liquid and apply to the skin prior to applying Dexcom sensor  Increase Mounjaro 7.5 mg weekly  Enter 10 grams with each meal and 5 grams with a snack    HOW TO TREAT LOW BLOOD SUGARS (Blood sugar LESS THAN 70 MG/DL) Please follow the RULE OF 15 for the treatment of hypoglycemia treatment (when your (blood sugars are less than 70 mg/dL)   STEP 1: Take 15 grams of carbohydrates when your blood sugar is low, which includes:  3-4 GLUCOSE TABS  OR 3-4 OZ OF JUICE OR REGULAR SODA OR ONE TUBE OF GLUCOSE GEL    STEP 2: RECHECK blood sugar in 15 MINUTES STEP 3: If your blood sugar is still low at the 15 minute recheck --> then, go back to STEP 1 and treat AGAIN with another 15 grams of carbohydrates.

## 2023-03-22 ENCOUNTER — Encounter: Payer: Self-pay | Admitting: Internal Medicine

## 2023-04-23 ENCOUNTER — Emergency Department (HOSPITAL_COMMUNITY): Payer: BC Managed Care – PPO

## 2023-04-23 ENCOUNTER — Other Ambulatory Visit: Payer: Self-pay

## 2023-04-23 ENCOUNTER — Emergency Department (HOSPITAL_COMMUNITY)
Admission: EM | Admit: 2023-04-23 | Discharge: 2023-04-23 | Disposition: A | Discharge status: Home / Self Care | Payer: BC Managed Care – PPO | Source: Home / Self Care | Attending: Emergency Medicine | Admitting: Emergency Medicine

## 2023-04-23 DIAGNOSIS — Z794 Long term (current) use of insulin: Secondary | ICD-10-CM | POA: Diagnosis not present

## 2023-04-23 DIAGNOSIS — M545 Low back pain, unspecified: Secondary | ICD-10-CM | POA: Diagnosis present

## 2023-04-23 DIAGNOSIS — W01198A Fall on same level from slipping, tripping and stumbling with subsequent striking against other object, initial encounter: Secondary | ICD-10-CM | POA: Insufficient documentation

## 2023-04-23 DIAGNOSIS — W010XXA Fall on same level from slipping, tripping and stumbling without subsequent striking against object, initial encounter: Secondary | ICD-10-CM

## 2023-04-23 DIAGNOSIS — S335XXA Sprain of ligaments of lumbar spine, initial encounter: Secondary | ICD-10-CM

## 2023-04-23 DIAGNOSIS — Z91040 Latex allergy status: Secondary | ICD-10-CM | POA: Diagnosis not present

## 2023-04-23 DIAGNOSIS — I10 Essential (primary) hypertension: Secondary | ICD-10-CM | POA: Diagnosis not present

## 2023-04-23 DIAGNOSIS — S339XXA Sprain of unspecified parts of lumbar spine and pelvis, initial encounter: Secondary | ICD-10-CM | POA: Diagnosis not present

## 2023-04-23 DIAGNOSIS — Z7982 Long term (current) use of aspirin: Secondary | ICD-10-CM | POA: Diagnosis not present

## 2023-04-23 DIAGNOSIS — Z79899 Other long term (current) drug therapy: Secondary | ICD-10-CM | POA: Insufficient documentation

## 2023-04-23 DIAGNOSIS — M546 Pain in thoracic spine: Secondary | ICD-10-CM | POA: Insufficient documentation

## 2023-04-23 IMAGING — CT CT ABD-PELV W/ CM
2 of 5 series · 16 of 46 positions shown, 18 images · IV contrast (Omnipaque or Isovue)
Comparison: 04/04/2021

CLINICAL DATA: Left lower quadrant pain for 2 days

EXAM:
CT ABDOMEN AND PELVIS WITH CONTRAST
TECHNIQUE: Multidetector CT imaging of the abdomen and pelvis was performed
using the standard protocol following bolus administration of
intravenous contrast.

[Series 2: axial st · axial · 0.89mm/px · z∈[+910,+1340]mm · 13 of 98 slices shown, 15 images]
[im 6/98  soft-tissue]
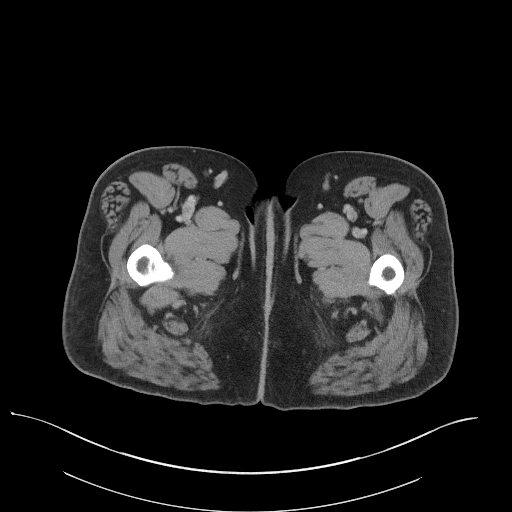
[im 6/98  bone]
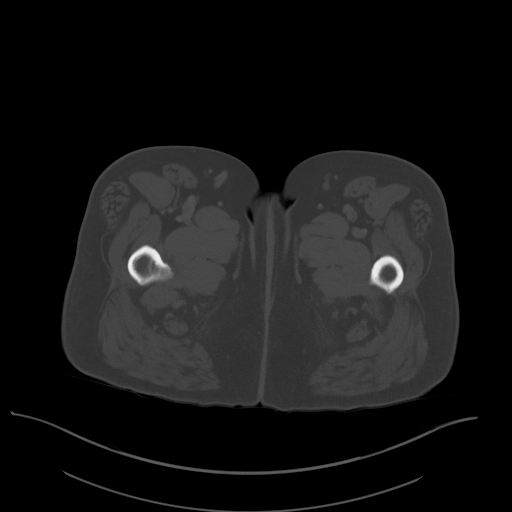
[im 11/98  soft-tissue]
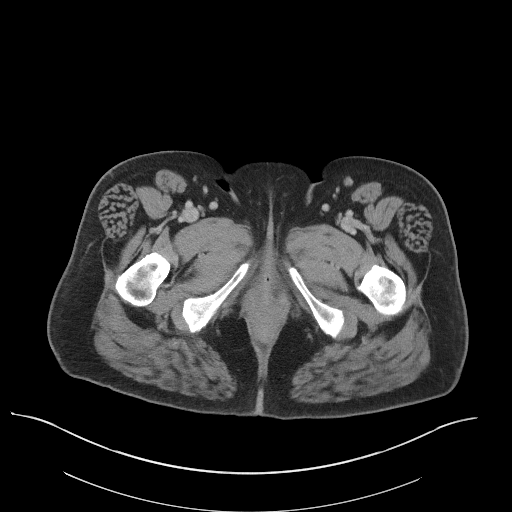
[im 22/98  soft-tissue]
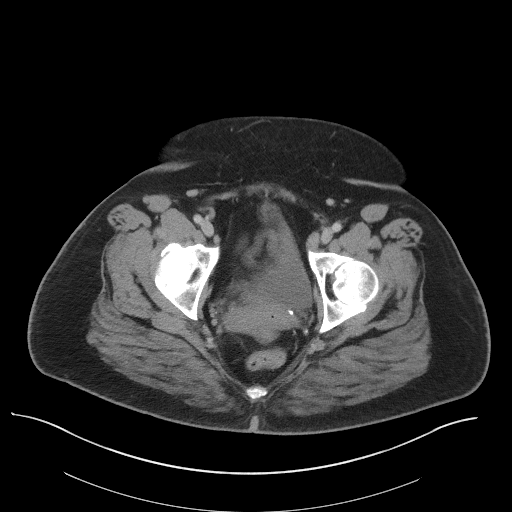
[im 27/98  soft-tissue]
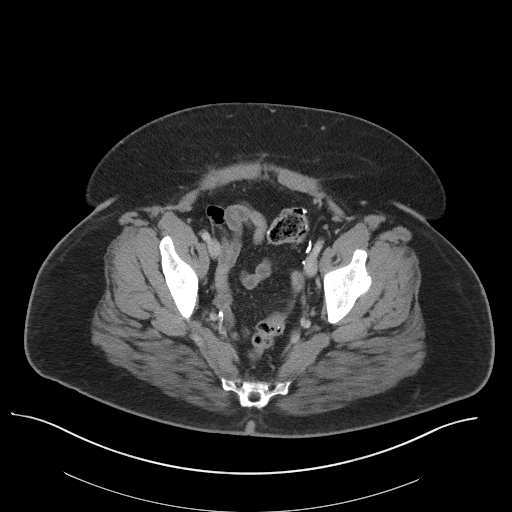
[im 33/98  soft-tissue]
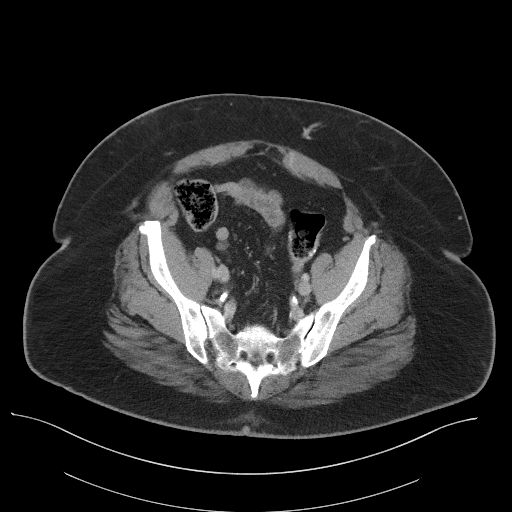
[im 44/98  soft-tissue]
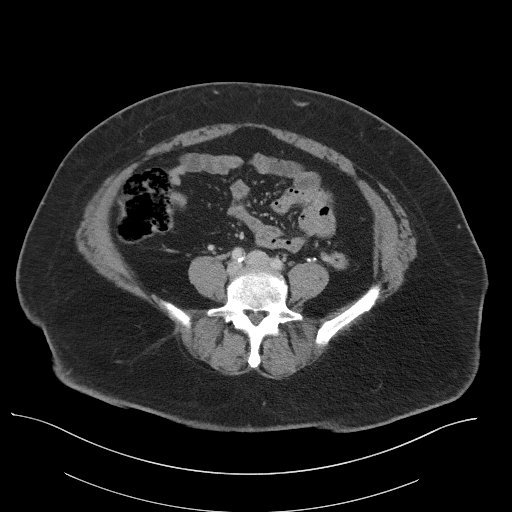
[im 49/98  soft-tissue]
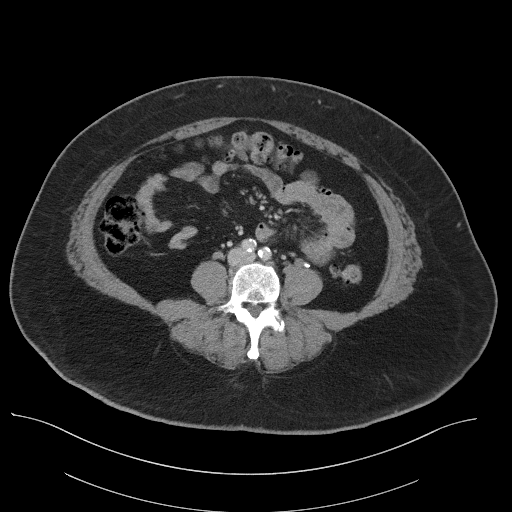
[im 54/98  soft-tissue]
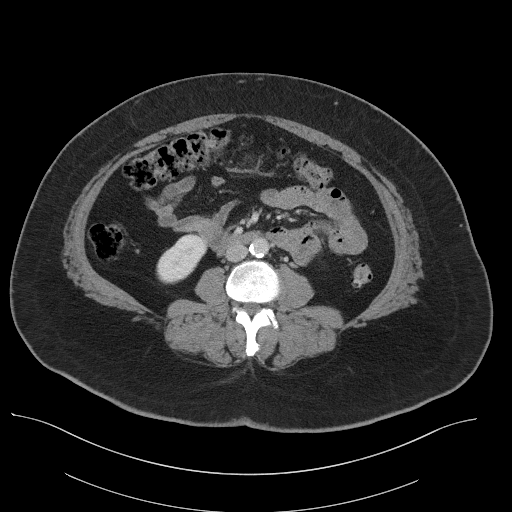
[im 65/98  soft-tissue]
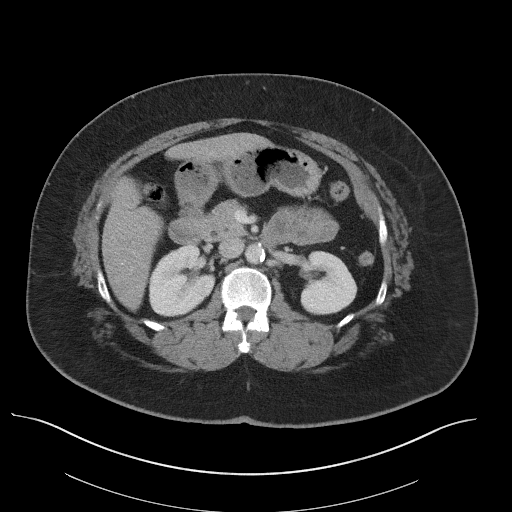
[im 65/98  bone]
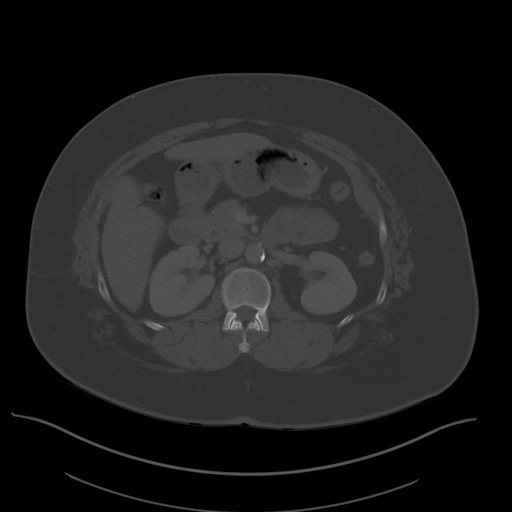
[im 71/98  soft-tissue]
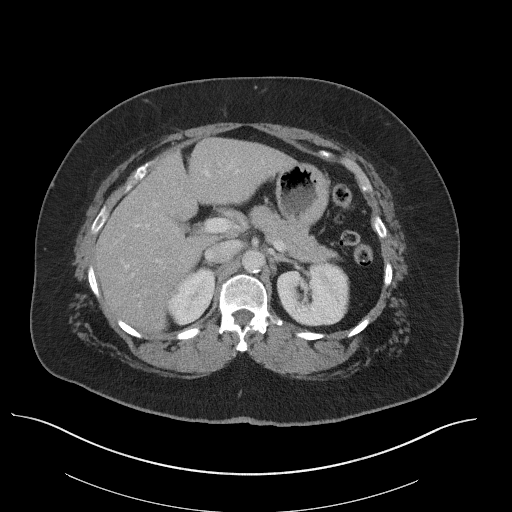
[im 76/98  soft-tissue]
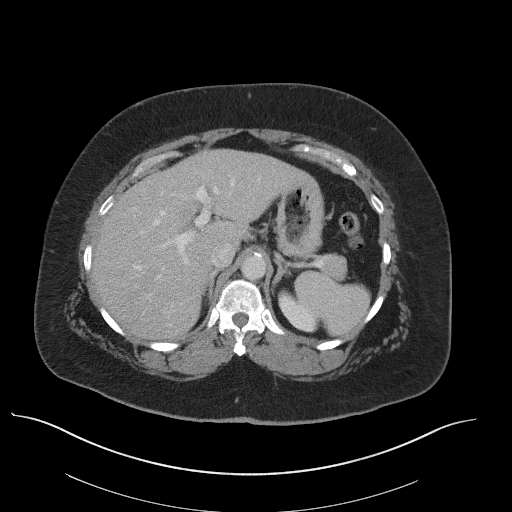
[im 87/98  soft-tissue]
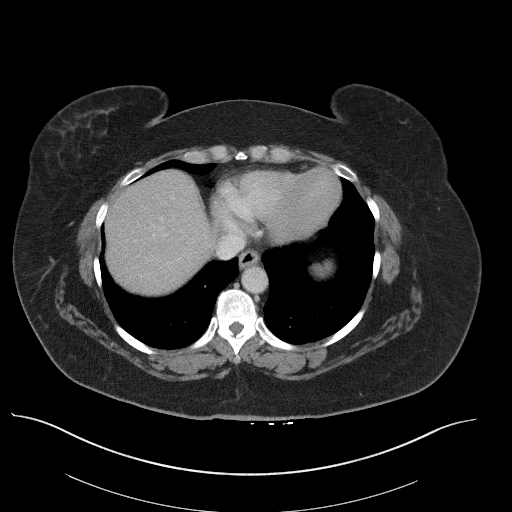
[im 92/98  soft-tissue]
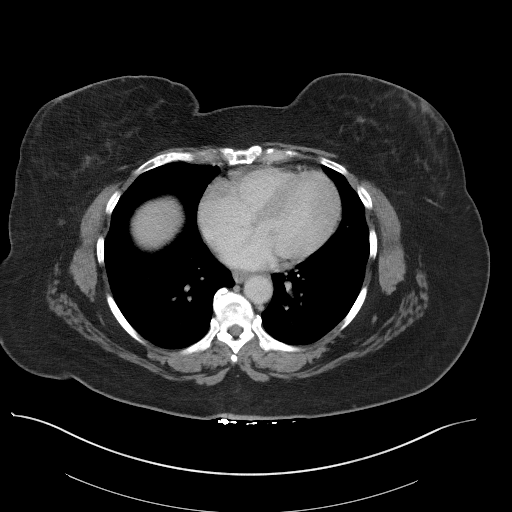

[Series 5: coronal st · coronal · 0.91mm/px · 3 of 123 slices shown]
[im 41/123  soft-tissue]
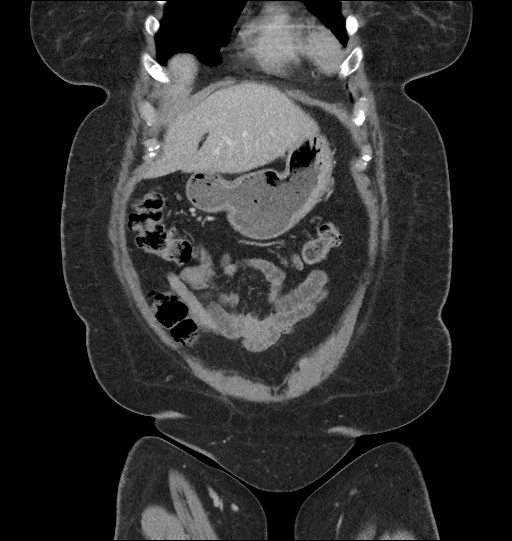
[im 55/123  soft-tissue]
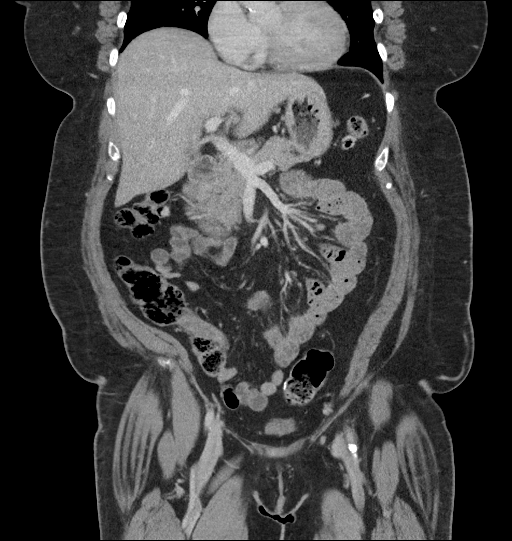
[im 68/123  soft-tissue]
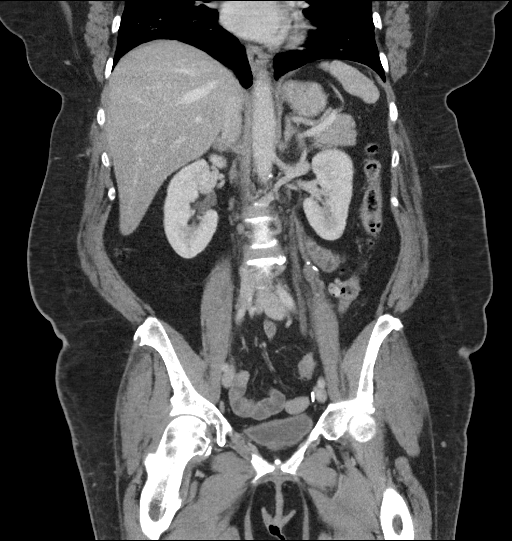

[16 of 46 positions shown; findings below may reference images not displayed]

RADIATION DOSE REDUCTION: This exam was performed according to the
departmental dose-optimization program which includes automated
exposure control, adjustment of the mA and/or kV according to
patient size and/or use of iterative reconstruction technique.

CONTRAST:  100mL OMNIPAQUE IOHEXOL 300 MG/ML  SOLN
FINDINGS: Lower chest: No acute abnormality.

Hepatobiliary: No focal liver abnormality is seen. No gallstones,
gallbladder wall thickening, or biliary dilatation.

Pancreas: Unremarkable. No pancreatic ductal dilatation or
surrounding inflammatory changes.

Spleen: Normal in size without focal abnormality.

Adrenals/Urinary Tract: Adrenal glands are within normal limits.
Kidneys demonstrate a normal enhancement pattern bilaterally. No
renal calculi or obstructive changes are seen. The bladder is
partially distended.

Stomach/Bowel: Postsurgical changes are noted in the sigmoid colon.
The anastomosis is widely patent. Scattered diverticular changes
noted without evidence of diverticulitis. The appendix is not
visualized and may have been surgically removed. Small bowel is
within normal limits. Stomach is unremarkable.

Vascular/Lymphatic: Aortic atherosclerosis. No enlarged abdominal or
pelvic lymph nodes.

Reproductive: Status post hysterectomy. No adnexal masses.

Other: No abdominal wall hernia or abnormality. No abdominopelvic
ascites.

Musculoskeletal: No acute or significant osseous findings.
IMPRESSION: Diverticulosis without diverticulitis.

Status post sigmoid colectomy with patent anastomosis.

No acute abnormality noted.

## 2023-04-23 MED ORDER — CYCLOBENZAPRINE HCL 10 MG PO TABS
10.0000 mg | ORAL_TABLET | Freq: Once | ORAL | Status: AC
  Administered 2023-04-23: 10 mg via ORAL
  Filled 2023-04-23: qty 1

## 2023-04-23 MED ORDER — CYCLOBENZAPRINE HCL 10 MG PO TABS: 10.0000 mg | ORAL_TABLET | Freq: Three times a day (TID) | ORAL | 0 refills | Status: AC | PRN

## 2023-04-23 MED ORDER — HYDROCODONE-ACETAMINOPHEN 5-325 MG PO TABS
1.0000 | ORAL_TABLET | Freq: Once | ORAL | Status: AC
  Administered 2023-04-23: 1 via ORAL
  Filled 2023-04-23: qty 1

## 2023-04-23 MED ORDER — OXYCODONE HCL 5 MG PO TABS: 5.0000 mg | ORAL_TABLET | ORAL | 0 refills | Status: DC | PRN

## 2023-04-23 NOTE — ED Provider Notes (Signed)
[place] EMERGENCY DEPARTMENT AT [facility] Provider Note   CSN: [id number] Arrival date & time: 04/23/23  0147     History  Chief Complaint  Patient presents with   Sarah [name] is a 62 y.o. female.  The history is provided by the patient.  Fall  She has history of hypertension, diabetes, hyperlipidemia and comes in following a fall at home.  She slipped on a wet floor and hit her head against the wall.  She denies loss of consciousness.  She is complaining of pain in her mid and lower back.  There is no radiation of pain.  She has been able to ambulate since the fall.  She took a dose of ibuprofen without relief, also tried applying topical lidocaine without relief.   Home Medications Prior to Admission medications   Medication Sig Start Date End Date Taking? Authorizing Provider  amLODipine (NORVASC) 5 MG tablet Take 5 mg by mouth daily. 02/09/22   [provider]  aspirin EC 81 MG tablet Take 81 mg by mouth daily.    [provider]  Cholecalciferol (VITAMIN D3) 1.25 MG (50000 UT) CAPS Take 1 capsule by mouth once a week. 01/14/22   [provider]  co-enzyme Q-10 30 MG capsule Take 30 mg by mouth daily.    [provider]  Continuous Blood Gluc Sensor (DEXCOM G7 SENSOR) MISC 1 Device by Does not apply route as directed. 10/14/22   Shamleffer, Konrad Dolores, MD  Continuous Blood Gluc Transmit (DEXCOM G6 TRANSMITTER) MISC 1 Device by Does not apply route as directed. 06/07/22   Shamleffer, Konrad Dolores, MD  diclofenac Sodium (VOLTAREN) 1 % GEL Apply 2 g topically 4 (four) times daily. 01/27/21   Vickki Hearing, MD  escitalopram (LEXAPRO) 5 MG tablet Take 10 mg by mouth at bedtime. 07/22/19   [provider]  gabapentin (NEURONTIN) 100 MG capsule Take 100 mg by mouth 3 (three) times daily. 12/27/22   [provider]  GVOKE PFS 1 MG/0.2ML SOSY SMARTSIG:1 Milligram(s) SUB-Q 12/27/22   [provider]  ibuprofen (ADVIL) 800 MG tablet Take 800 mg by mouth every 6 (six) hours as needed for moderate pain.    [provider]  insulin aspart (NOVOLOG) 100 UNIT/ML injection 100u per day via insulin pump 11/28/22   Shamleffer, Konrad Dolores, MD  Insulin Infusion Pump (T: SLIM X2 INS PMP/CONTROL 7.4) DEVI 1 Device by Does not apply route every other day. This prescription is for T:slimX2 3ml cartridges. 07/07/21   Romero Belling, MD  linaclotide [facility]) 145 MCG CAPS capsule Take 1 capsule (145 mcg total) by mouth daily before breakfast. 12/27/21   Letta Median, PA-C  LORazepam (ATIVAN) 0.5 MG tablet Take 0.5 mg by mouth 2 (two) times daily as needed for anxiety. 11/28/19   [provider]  meloxicam (MOBIC) 7.5 MG tablet Take 1 tablet (7.5 mg total) by mouth daily. 12/10/21   Vickki Hearing, MD  methocarbamol (ROBAXIN) 750 MG tablet Take 750 mg by mouth 3 (three) times daily as needed. 01/24/23   [provider]  metoprolol tartrate (LOPRESSOR) 50 MG tablet Take 1 tablet (50 mg total) by mouth 2 (two) times daily. 08/25/22 03/21/23  Wendall Stade, MD  ondansetron (ZOFRAN) 4 MG tablet Take 1 tablet (4 mg total) by mouth every 8 (eight) hours as needed for nausea or vomiting. 03/03/22   Terrilee Files, MD  simvastatin (ZOCOR) 20 MG tablet  Take 20 mg by mouth See admin instructions. Every Monday and Friday 10/19/19   [provider]  tirzepatide [facility]) 7.5 MG/0.5ML Pen Inject 7.5 mg into the skin once a week. 03/21/23   Shamleffer, Konrad Dolores, MD      Allergies    Sulfonamide derivatives and Latex    Review of Systems   Review of Systems  All other systems reviewed and are negative.   Physical Exam Updated Vital Signs BP (!) 169/74 (BP Location: Right Arm)   Pulse 76   Temp 97.7 F (36.5 C) (Oral)   Resp 18   Ht 5\' 3"  (1.6 m)   Wt 88 kg   SpO2 97%   BMI 34.37 kg/m  Physical Exam Vitals and nursing note reviewed.   61 year old female,  resting comfortably and in no acute distress. Vital signs are significant for elevated blood pressure. Oxygen saturation is 97%, which is normal. Head is normocephalic and atraumatic. PERRLA, EOMI. Oropharynx is clear. Neck is nontender. Back is moderately tender in the lower thoracic region as well as the entire lumbar region. Lungs are clear without rales, wheezes, or rhonchi. Chest is nontender. Heart has regular rate and rhythm without murmur. Abdomen is soft, flat, nontender. Extremities have no cyanosis or edema, full range of motion is present. Skin is warm and dry without rash. Neurologic: Mental status is normal, cranial nerves are intact, moves all extremities equally, no sensory deficits.  ED Results / Procedures / Treatments    Radiology DG Lumbar Spine Complete  Result Date: 04/23/2023 CLINICAL DATA:  Fall EXAM: LUMBAR SPINE - COMPLETE 4+ VIEW COMPARISON:  None Available. FINDINGS: Degenerative facet disease in the mid to lower lumbar spine. Normal alignment. No fracture. Disc spaces maintained. Aortic atherosclerosis. IMPRESSION: No acute bony abnormality. Degenerative facet disease in the mid to lower lumbar spine. Electronically Signed   By: Charlett Nose M.D.   On: 04/23/2023 03:44   DG Thoracic Spine 4V  Result Date: 04/23/2023 CLINICAL DATA:  Fall EXAM: THORACIC SPINE - 4+ VIEW COMPARISON:  None Available. FINDINGS: There is no evidence of thoracic spine fracture. Alignment is normal. No other significant bone abnormalities are identified. IMPRESSION: Negative. Electronically Signed   By: Charlett Nose M.D.   On: 04/23/2023 03:43   CT Head Wo Contrast  Result Date: 04/23/2023 CLINICAL DATA:  Head and neck trauma, fall. EXAM: CT HEAD WITHOUT CONTRAST CT CERVICAL SPINE WITHOUT CONTRAST TECHNIQUE: Multidetector CT imaging of the head and cervical spine was performed following the standard protocol without intravenous contrast. Multiplanar CT image reconstructions of the cervical  spine were also generated. RADIATION DOSE REDUCTION: This exam was performed according to the departmental dose-optimization program which includes automated exposure control, adjustment of the mA and/or kV according to patient size and/or use of iterative reconstruction technique. COMPARISON:  None Available. FINDINGS: CT HEAD FINDINGS Brain: No acute intracranial hemorrhage, midline shift or mass effect. No extra-axial fluid collection. Mild periventricular white matter hypodensities are present bilaterally. No hydrocephalus. Vascular: No hyperdense vessel or unexpected calcification. Skull: Normal. Negative for fracture or focal lesion. Sinuses/Orbits: No acute finding. Other: None. CT CERVICAL SPINE FINDINGS Alignment: Normal. Skull base and vertebrae: No acute fracture. No primary bone lesion or focal pathologic process. Soft tissues and spinal canal: No prevertebral fluid or swelling. No visible canal hematoma. Disc levels:  Mild degenerative endplate changes. Upper chest: No acute abnormality. Other: Carotid artery calcifications. IMPRESSION: 1. No acute intracranial process. 2. Chronic microvascular ischemic changes. 3. Mild  degenerative changes in the cervical spine without evidence of acute fracture. Electronically Signed   By: Thornell Sartorius M.D.   On: 04/23/2023 03:32   CT Cervical Spine Wo Contrast  Result Date: 04/23/2023 CLINICAL DATA:  Head and neck trauma, fall. EXAM: CT HEAD WITHOUT CONTRAST CT CERVICAL SPINE WITHOUT CONTRAST TECHNIQUE: Multidetector CT imaging of the head and cervical spine was performed following the standard protocol without intravenous contrast. Multiplanar CT image reconstructions of the cervical spine were also generated. RADIATION DOSE REDUCTION: This exam was performed according to the departmental dose-optimization program which includes automated exposure control, adjustment of the mA and/or kV according to patient size and/or use of iterative reconstruction technique.  COMPARISON:  None Available. FINDINGS: CT HEAD FINDINGS Brain: No acute intracranial hemorrhage, midline shift or mass effect. No extra-axial fluid collection. Mild periventricular white matter hypodensities are present bilaterally. No hydrocephalus. Vascular: No hyperdense vessel or unexpected calcification. Skull: Normal. Negative for fracture or focal lesion. Sinuses/Orbits: No acute finding. Other: None. CT CERVICAL SPINE FINDINGS Alignment: Normal. Skull base and vertebrae: No acute fracture. No primary bone lesion or focal pathologic process. Soft tissues and spinal canal: No prevertebral fluid or swelling. No visible canal hematoma. Disc levels:  Mild degenerative endplate changes. Upper chest: No acute abnormality. Other: Carotid artery calcifications. IMPRESSION: 1. No acute intracranial process. 2. Chronic microvascular ischemic changes. 3. Mild degenerative changes in the cervical spine without evidence of acute fracture. Electronically Signed   By: Thornell Sartorius M.D.   On: 04/23/2023 03:32    Procedures Procedures    Medications Ordered in ED Medications  HYDROcodone-acetaminophen (NORCO/VICODIN) 5-325 MG per tablet 1 tablet (has no administration in time range)  cyclobenzaprine (FLEXERIL) tablet 10 mg (has no administration in time range)    ED Course/ Medical Decision Making/ A&P                                 Medical Decision Making Amount and/or Complexity of Data Reviewed Radiology: ordered.  Risk Prescription drug management.   Fall with main complaint being mid and lower back pain.  I have low index of suspicion for fracture but I have ordered x-rays.  I suspect that this is more muscular.  With mechanism of injury, I am concerned about occult head injury and I have ordered CT scans of head and cervical spine.  I have ordered a dose of hydrocodone-acetaminophen for pain and a dose of cyclobenzaprine for muscle spasm.  X-rays showed no acute injury, CT of head and  cervical spine also showed no evidence of acute injury.  I have independently viewed all of the images, and agree with radiologist's interpretation.  She did get significant relief with above-noted medication.  I am discharging her with prescription for cyclobenzaprine and a small number of oxycodone tablets.  I have advised her to use over-the-counter NSAIDs and acetaminophen as needed for pain relief and reserve oxycodone for breakthrough pain.  Final Clinical Impression(s) / ED Diagnoses Final diagnoses:  Fall from slipping, initial encounter  Lumbar sprain, initial encounter  Elevated blood pressure reading with diagnosis of hypertension    Rx / DC Orders ED Discharge Orders          Ordered    cyclobenzaprine (FLEXERIL) 10 MG tablet  3 times daily PRN        04/23/23 0424    oxyCODONE (ROXICODONE) 5 MG immediate release tablet  Every 4 hours PRN  04/23/23 0424              Dione Booze, MD 04/23/23 [phone]

## 2023-04-23 NOTE — ED Triage Notes (Signed)
Pt arrived via POV c/o a fall last night on a wet floor that caused lumbar back pain, left elbow pain and posterior head pain. Pt denies any N/V, denies LOC.

## 2023-04-23 NOTE — Discharge Instructions (Signed)
Apply ice for 30 minutes at a time, 4 times a day.  Take ibuprofen and/or acetaminophen as needed for pain.  If you combine ibuprofen and acetaminophen, you will get better pain relief and you get from taking either medication by itself.  For pain not controlled by the combination of ibuprofen and acetaminophen, you may take oxycodone.  Take the muscle relaxer as needed.

## 2023-06-09 ENCOUNTER — Other Ambulatory Visit: Payer: Self-pay

## 2023-06-09 ENCOUNTER — Telehealth: Payer: Self-pay

## 2023-06-09 MED ORDER — TIRZEPATIDE 7.5 MG/0.5ML ~~LOC~~ SOAJ: 7.5000 mg | SUBCUTANEOUS | 3 refills | Status: DC

## 2023-06-09 MED ORDER — T: SLIM X2 INS PMP/CONTROL 7.4 DEVI: 1.0000 | 3 refills | Status: DC

## 2023-06-09 NOTE — Telephone Encounter (Signed)
Can you send a prescription for the cartridges for the T-slim to [facility] . Couldn't find prescription on list

## 2023-06-09 NOTE — Telephone Encounter (Signed)
I spoke to Sarah [name] and she is aware that the order has been sent to [facility]

## 2023-06-15 ENCOUNTER — Other Ambulatory Visit (INDEPENDENT_AMBULATORY_CARE_PROVIDER_SITE_OTHER): Payer: Self-pay

## 2023-06-15 ENCOUNTER — Encounter: Payer: Self-pay | Admitting: Orthopedic Surgery

## 2023-06-15 ENCOUNTER — Ambulatory Visit: Payer: BC Managed Care – PPO | Admitting: Orthopedic Surgery

## 2023-06-15 VITALS — BP 97/60 | HR 90 | Ht 63.0 in | Wt 190.0 lb

## 2023-06-15 DIAGNOSIS — M1712 Unilateral primary osteoarthritis, left knee: Secondary | ICD-10-CM | POA: Diagnosis not present

## 2023-06-15 DIAGNOSIS — G8929 Other chronic pain: Secondary | ICD-10-CM

## 2023-06-15 MED ORDER — DICLOFENAC SODIUM 50 MG PO TBEC: 50.0000 mg | DELAYED_RELEASE_TABLET | Freq: Two times a day (BID) | ORAL | 2 refills | Status: DC

## 2023-06-15 NOTE — Progress Notes (Signed)
Chief Complaint  Patient presents with   Knee Pain    Left injury left knee fracture years ago now states the older she gets the more it hurts     Office Visit Note   Patient: Sarah [name]           Date of Birth: 1960-12-16           MRN: [id number] Visit Date: 06/15/2023 Requested by: Benita Stabile, MD [address] [place],  [place] [address] PCP: Benita Stabile, MD   Assessment & Plan:   62 year old female first time treatment for osteoarthritis  She has grade 2 disease on x-ray  Recommend changing her meloxicam to diclofenac twice a day, PT, bracing if needed, injection she declined  Follow-up 3 months  Encounter Diagnoses  Name Primary?   Chronic pain of left knee    Primary osteoarthritis of left knee Yes    Meds ordered this encounter  Medications   diclofenac (VOLTAREN) 50 MG EC tablet    Sig: Take 1 tablet (50 mg total) by mouth 2 (two) times daily.    Dispense:  60 tablet    Refill:  2       Subjective: Chief Complaint  Patient presents with   Knee Pain    Left injury left knee fracture years ago now states the older she gets the more it hurts    HPI: 62 year old female status post knee injury as a teenager describes what sounds to be traction treatment presents with worsening left knee pain for the last year or so with decreased range of motion especially trying to extend the knee painful walking especially for long periods of time.  Most of the pain described as lateral knee pain with a dull ache.  No effusion              ROS: Positive findings include dizziness when her sugar is too high or too low, denies chest pain or shortness of breath   Images personally read and my interpretation : Internal imaging shows mild grade 2 disease see report  Visit Diagnoses:  1. Primary osteoarthritis of left knee   2. Chronic pain of left knee      Follow-Up Instructions: Return in about 3 months (around 09/15/2023) for KNEE OA , LEFT.     Objective: Vital Signs: BP 97/60   Pulse 90   Ht 5\' 3"  (1.6 m)   Wt 190 lb (86.2 kg)   BMI 33.66 kg/m   Physical Exam Vitals and nursing note reviewed.  Constitutional:      Appearance: Normal appearance.  HENT:     Head: Normocephalic and atraumatic.  Eyes:     General: No scleral icterus.       Right eye: No discharge.        Left eye: No discharge.     Extraocular Movements: Extraocular movements intact.     Conjunctiva/sclera: Conjunctivae normal.     Pupils: Pupils are equal, round, and reactive to light.  Cardiovascular:     Rate and Rhythm: Normal rate.     Pulses: Normal pulses.  Musculoskeletal:     Right knee:     Instability Tests: Medial McMurray test negative and lateral McMurray test negative.     Left knee: No effusion.     Instability Tests: Medial McMurray test negative and lateral McMurray test negative.  Skin:    General: Skin is warm and dry.     Capillary Refill: Capillary  refill takes less than 2 seconds.  Neurological:     General: No focal deficit present.     Mental Status: She is alert and oriented to person, place, and time.     Gait: Gait abnormal.  Psychiatric:        Mood and Affect: Mood normal.        Behavior: Behavior normal.        Thought Content: Thought content normal.        Judgment: Judgment normal.     Right Knee Exam   Muscle Strength  The patient has normal right knee strength.  Tenderness  The patient is experiencing no tenderness.   Range of Motion  Extension:  normal  Flexion:  normal   Tests  McMurray:  Medial - negative Lateral - negative Varus: negative Valgus: negative Drawer:  Anterior - negative    Posterior - negative  Other  Erythema: absent Scars: absent Sensation: normal Pulse: present Swelling: none   Left Knee Exam   Muscle Strength  The patient has normal left knee strength.  Tenderness  The patient is experiencing tenderness in the lateral joint line.  Range of Motion   Extension:  abnormal  Flexion:  normal   Tests  McMurray:  Medial - negative Lateral - negative Varus: negative Valgus: negative Drawer:  Anterior - negative     Posterior - negative  Other  Erythema: absent Scars: absent Sensation: normal Pulse: present Swelling: none Effusion: no effusion present      Specialty Comments:  No specialty comments available.  Imaging: DG Knee AP/LAT W/Sunrise Left  Result Date: 06/15/2023 Left knee pain 3 views left knee Normal alignment Osteophyte seen medially small Patellofemoral alignment normal with what appears to be narrowing of the lateral facet and the lateral trochlea Mild grade 2 disease definitive with possibility of joint space narrowing     PMFS History: Patient Active Problem List   Diagnosis Date Noted   Urinary frequency 10/14/2022   Type 2 diabetes mellitus with diabetic polyneuropathy, with long-term current use of insulin (HCC) 06/07/2022   Type 2 diabetes mellitus with hyperglycemia, with long-term current use of insulin (HCC) 06/07/2022   Acute pyelonephritis 03/05/2022   Hypoalbuminemia due to protein-calorie malnutrition (HCC) 03/05/2022   Lower abdominal pain 12/10/2021   Hernia of abdominal cavity 01/03/2021   Elevated LFTs 01/03/2021   History of severe acute respiratory syndrome coronavirus 2 (SARS-CoV-2) disease 01/03/2021   Hardening of the aorta (main artery of the heart) (HCC) 01/03/2021   Neuropathy 01/03/2021   Obesity 01/03/2021   Ventricular tachycardia (HCC) 01/03/2021   Umbilical hernia without obstruction and without gangrene    Special screening for malignant neoplasms, colon    Diverticulosis of large intestine without diverticulitis    History of palpitations in adulthood 07/02/2018   Murmur, cardiac 07/02/2018   History of chest pain 06/27/2018   Essential hypertension 06/27/2018   Trigger middle finger    Type 2 diabetes mellitus with obesity (HCC) 08/19/2015   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 08/19/2015   Visit for routine gyn exam 08/19/2015   Diabetes mellitus with hyperglycemia (HCC) 06/24/2013   Rectal bleed 03/23/2012   Mixed hyperlipidemia 11/30/2009   Anxiety state 11/30/2009   Constipation 11/30/2009   Past Medical History:  Diagnosis Date   Anxiety    Diabetes mellitus    Diverticulosis    GERD (gastroesophageal reflux disease)    Hypercholesteremia    Hypertension     Family History  Problem Relation Age of Onset   Colon polyps Mother        in her 15s   Diabetes Mother    Hypertension Mother    Heart attack Father    Heart murmur Sister    Hypertension Brother    Colon cancer Maternal Grandfather        65   Diabetes Son    Liver disease Neg Hx     Past Surgical History:  Procedure Laterality Date   ABDOMINAL HYSTERECTOMY     complete   CARDIAC CATHETERIZATION  2011   COLONOSCOPY  01/04/2010   left sided transverse diverticula/two diminutive rectal polyps   COLONOSCOPY  03/28/2012   Surgeon: Corbin Ade, MD; riable anal canal with suspected trivial rectal bleeding from this location, colonic diverticulosis, s/p segmental resection, single diminutive polyp in the descending colon resected.  Pathology was benign.  Recommended 10-year repeat.   COLONOSCOPY N/A 09/11/2018   Surgeon: Franky Macho, MD;   moderate diverticulosis in the descending colon, otherwise normal exam.  Recommended 10-year repeat.   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

## 2023-06-15 NOTE — Patient Instructions (Addendum)
Stop the Meloxicam and start the new medicine Dr Romeo Apple sends in  Physical therapy has been ordered for you at [facility]. They should call you to schedule, [phone] is the phone number to call, if you want to call to schedule.   Call [employer] since you had xray today they will send you a replacement device  [employer]/Customer service 1 [phone]

## 2023-06-16 ENCOUNTER — Telehealth: Payer: Self-pay

## 2023-06-16 ENCOUNTER — Other Ambulatory Visit (HOSPITAL_COMMUNITY): Payer: Self-pay

## 2023-06-16 NOTE — Telephone Encounter (Signed)
PA needed for [facility]. Pharmacy states that they have sent several request

## 2023-06-16 NOTE — Telephone Encounter (Signed)
Pharmacy Patient Advocate Encounter   Received notification from Pt Calls Messages that prior authorization for Sarah [name] is required/requested.   Per test claim: PA required; PA submitted to [facility] via CoverMyMeds Key/confirmation #/EOC [id number] Status is pending

## 2023-06-18 ENCOUNTER — Other Ambulatory Visit: Payer: Self-pay

## 2023-06-18 ENCOUNTER — Emergency Department (HOSPITAL_COMMUNITY)
Admission: EM | Admit: 2023-06-18 | Discharge: 2023-06-18 | Disposition: A | Discharge status: Home / Self Care | Payer: BC Managed Care – PPO | Attending: Emergency Medicine | Admitting: Emergency Medicine

## 2023-06-18 ENCOUNTER — Encounter (HOSPITAL_COMMUNITY): Payer: Self-pay | Admitting: *Deleted

## 2023-06-18 DIAGNOSIS — Z794 Long term (current) use of insulin: Secondary | ICD-10-CM | POA: Insufficient documentation

## 2023-06-18 DIAGNOSIS — Z79899 Other long term (current) drug therapy: Secondary | ICD-10-CM | POA: Insufficient documentation

## 2023-06-18 DIAGNOSIS — Z91040 Latex allergy status: Secondary | ICD-10-CM | POA: Insufficient documentation

## 2023-06-18 DIAGNOSIS — I10 Essential (primary) hypertension: Secondary | ICD-10-CM | POA: Insufficient documentation

## 2023-06-18 DIAGNOSIS — Z7982 Long term (current) use of aspirin: Secondary | ICD-10-CM | POA: Insufficient documentation

## 2023-06-18 DIAGNOSIS — R252 Cramp and spasm: Secondary | ICD-10-CM | POA: Insufficient documentation

## 2023-06-18 DIAGNOSIS — E871 Hypo-osmolality and hyponatremia: Secondary | ICD-10-CM | POA: Diagnosis not present

## 2023-06-18 DIAGNOSIS — E119 Type 2 diabetes mellitus without complications: Secondary | ICD-10-CM | POA: Insufficient documentation

## 2023-06-18 HISTORY — DX: Presence of insulin pump (external) (internal): Z96.41

## 2023-06-18 LAB — CBC WITH DIFFERENTIAL/PLATELET
Abs Immature Granulocytes: 0.01 10*3/uL (ref 0.00–0.07)
Basophils Absolute: 0 10*3/uL (ref 0.0–0.1)
Basophils Relative: 0 %
Eosinophils Absolute: 0.1 10*3/uL (ref 0.0–0.5)
Eosinophils Relative: 1 %
HCT: 35.5 % — ABNORMAL LOW (ref 36.0–46.0)
Hemoglobin: 12.6 g/dL (ref 12.0–15.0)
Immature Granulocytes: 0 %
Lymphocytes Relative: 39 %
Lymphs Abs: 3 10*3/uL (ref 0.7–4.0)
MCH: 28.1 pg (ref 26.0–34.0)
MCHC: 35.5 g/dL (ref 30.0–36.0)
MCV: 79.2 fL — ABNORMAL LOW (ref 80.0–100.0)
Monocytes Absolute: 0.6 10*3/uL (ref 0.1–1.0)
Monocytes Relative: 7 %
Neutro Abs: 4.1 10*3/uL (ref 1.7–7.7)
Neutrophils Relative %: 53 %
Platelets: 271 10*3/uL (ref 150–400)
RBC: 4.48 MIL/uL (ref 3.87–5.11)
RDW: 13.9 % (ref 11.5–15.5)
WBC: 7.8 10*3/uL (ref 4.0–10.5)
nRBC: 0 % (ref 0.0–0.2)

## 2023-06-18 LAB — CK: Total CK: 260 U/L — ABNORMAL HIGH (ref 38–234)

## 2023-06-18 LAB — COMPREHENSIVE METABOLIC PANEL
ALT: 34 U/L (ref 0–44)
AST: 30 U/L (ref 15–41)
Albumin: 3.1 g/dL — ABNORMAL LOW (ref 3.5–5.0)
Alkaline Phosphatase: 89 U/L (ref 38–126)
Anion gap: 8 (ref 5–15)
BUN: 18 mg/dL (ref 8–23)
CO2: 23 mmol/L (ref 22–32)
Calcium: 8.4 mg/dL — ABNORMAL LOW (ref 8.9–10.3)
Chloride: 100 mmol/L (ref 98–111)
Creatinine, Ser: 0.67 mg/dL (ref 0.44–1.00)
GFR, Estimated: >60 mL/min (ref >60)
Glucose, Bld: 323 mg/dL — ABNORMAL HIGH (ref 70–99)
Potassium: 4.1 mmol/L (ref 3.5–5.1)
Sodium: 131 mmol/L — ABNORMAL LOW (ref 135–145)
Total Bilirubin: 0.4 mg/dL (ref 0.3–1.2)
Total Protein: 7.1 g/dL (ref 6.5–8.1)

## 2023-06-18 LAB — MAGNESIUM: Magnesium: 1.8 mg/dL (ref 1.7–2.4)

## 2023-06-18 MED ORDER — SODIUM CHLORIDE 0.9 % IV BOLUS
1000.0000 mL | Freq: Once | INTRAVENOUS | Status: AC
  Administered 2023-06-18: 1000 mL via INTRAVENOUS

## 2023-06-18 MED ORDER — CALCIUM CARBONATE ANTACID 500 MG PO CHEW
800.0000 mg | CHEWABLE_TABLET | Freq: Once | ORAL | Status: AC
  Administered 2023-06-18: 800 mg via ORAL
  Filled 2023-06-18: qty 4

## 2023-06-18 MED ORDER — DIAZEPAM 5 MG/ML IJ SOLN
2.5000 mg | Freq: Once | INTRAMUSCULAR | Status: AC
  Administered 2023-06-18: 2.5 mg via INTRAVENOUS
  Filled 2023-06-18: qty 2

## 2023-06-18 NOTE — ED Provider Notes (Signed)
[place] EMERGENCY DEPARTMENT AT [facility] Provider Note   CSN: [id number] Arrival date & time: 06/18/23  7829     History  Chief Complaint  Patient presents with   Leg Pain    Sarah [name] is a 62 y.o. female.  With a history of type 2 diabetes, hypertension, and peripheral neuropathy who presents to the ED for leg pain.  She first experienced a cramping sensation in both of her feet around 1700 last night.  Cramping pain in both legs has been worse through the morning.  She has experienced similar episodes of lower extremity cramping which were attributed to dehydration and low vitamin D.  No recent exertion or injuries to the lower extremities.  Patient mentions she was started on a new medication (diclofenac) 4 days ago for osteoarthritis   Leg Pain      Home Medications Prior to Admission medications   Medication Sig Start Date End Date Taking? Authorizing Provider  amLODipine (NORVASC) 5 MG tablet Take 5 mg by mouth daily. 02/09/22   [provider]  aspirin EC 81 MG tablet Take 81 mg by mouth daily.    [provider]  Cholecalciferol (VITAMIN D3) 1.25 MG (50000 UT) CAPS Take 1 capsule by mouth once a week. 01/14/22   [provider]  co-enzyme Q-10 30 MG capsule Take 30 mg by mouth daily.    [provider]  Continuous Blood Gluc Sensor (DEXCOM G7 SENSOR) MISC 1 Device by Does not apply route as directed. 10/14/22   Shamleffer, Konrad Dolores, MD  Continuous Blood Gluc Transmit (DEXCOM G6 TRANSMITTER) MISC 1 Device by Does not apply route as directed. 06/07/22   Shamleffer, Konrad Dolores, MD  cyclobenzaprine (FLEXERIL) 10 MG tablet Take 1 tablet (10 mg total) by mouth 3 (three) times daily as needed for muscle spasms. 04/23/23   Dione Booze, MD  diclofenac (VOLTAREN) 50 MG EC tablet Take 1 tablet (50 mg total) by mouth 2 (two) times daily. 06/15/23   Vickki Hearing, MD  diclofenac Sodium (VOLTAREN) 1 % GEL Apply 2 g  topically 4 (four) times daily. 01/27/21   Vickki Hearing, MD  escitalopram (LEXAPRO) 5 MG tablet Take 10 mg by mouth at bedtime. 07/22/19   [provider]  gabapentin (NEURONTIN) 100 MG capsule Take 100 mg by mouth 3 (three) times daily. 12/27/22   [provider]  GVOKE PFS 1 MG/0.2ML SOSY SMARTSIG:1 Milligram(s) SUB-Q 12/27/22   [provider]  ibuprofen (ADVIL) 800 MG tablet Take 800 mg by mouth every 6 (six) hours as needed for moderate pain.    [provider]  insulin aspart (NOVOLOG) 100 UNIT/ML injection 100u per day via insulin pump 11/28/22   Shamleffer, Konrad Dolores, MD  Insulin Infusion Pump (T: SLIM X2 INS PMP/CONTROL 7.4) DEVI 1 Device by Does not apply route every other day. This prescription is for T:slimX2 3ml cartridges. 06/09/23   Shamleffer, Konrad Dolores, MD  linaclotide [facility]) 145 MCG CAPS capsule Take 1 capsule (145 mcg total) by mouth daily before breakfast. 12/27/21   Letta Median, PA-C  LORazepam (ATIVAN) 0.5 MG tablet Take 0.5 mg by mouth 2 (two) times daily as needed for anxiety. 11/28/19   [provider]  omeprazole (PRILOSEC) 20 MG capsule Take 20 mg by mouth daily. 05/12/23   [provider]  ondansetron (ZOFRAN) 4 MG tablet Take 1 tablet (4 mg total) by mouth every 8 (eight) hours as needed for nausea or vomiting.  03/03/22   Terrilee Files, MD  oxyCODONE (ROXICODONE) 5 MG immediate release tablet Take 1 tablet (5 mg total) by mouth every 4 (four) hours as needed for severe pain. 04/23/23   Dione Booze, MD  simvastatin (ZOCOR) 20 MG tablet Take 20 mg by mouth See admin instructions. Every Monday and Friday 10/19/19   [provider]  tirzepatide [facility]) 7.5 MG/0.5ML Pen Inject 7.5 mg into the skin once a week. 06/09/23   Shamleffer, Konrad Dolores, MD      Allergies    Sulfonamide derivatives and Latex    Review of Systems   Review of Systems  Physical Exam Updated Vital Signs BP (!)  149/68 (BP Location: Right Arm)   Pulse 70   Temp 97.9 F (36.6 C) (Oral)   Resp 20   Ht 5\' 3"  (1.6 m)   Wt 85.7 kg   SpO2 96%   BMI 33.48 kg/m  Physical Exam Vitals and nursing note reviewed.  HENT:     Head: Normocephalic and atraumatic.  Eyes:     Pupils: Pupils are equal, round, and reactive to light.  Cardiovascular:     Rate and Rhythm: Normal rate and regular rhythm.  Pulmonary:     Effort: Pulmonary effort is normal.     Breath sounds: Normal breath sounds.  Abdominal:     Palpations: Abdomen is soft.     Tenderness: There is no abdominal tenderness.  Musculoskeletal:     Comments: Tenderness to deep palpation of bilateral calves and quadriceps 2+ DP pulse bilaterally Sensation tact light touch-year-old 5 out of 5 motor strength bilateral lower extremities No mild clonus or rigidity  Skin:    General: Skin is warm and dry.  Neurological:     Mental Status: She is alert.  Psychiatric:        Mood and Affect: Mood normal.     ED Results / Procedures / Treatments   Labs (all labs ordered are listed, but only abnormal results are displayed) Labs Reviewed  COMPREHENSIVE METABOLIC PANEL - Abnormal; Notable for the following components:      Result Value   Sodium 131 (*)    Glucose, Bld 323 (*)    Calcium 8.4 (*)    Albumin 3.1 (*)    All other components within normal limits  CBC WITH DIFFERENTIAL/PLATELET - Abnormal; Notable for the following components:   HCT 35.5 (*)    MCV 79.2 (*)    All other components within normal limits  CK - Abnormal; Notable for the following components:   Total CK 260 (*)    All other components within normal limits  MAGNESIUM    EKG EKG Interpretation Date/Time:  Sunday June 18 2023 08:39:37 EDT Ventricular Rate:  73 PR Interval:  120 QRS Duration:  85 QT Interval:  426 QTC Calculation: 470 R Axis:   65  Text Interpretation: Sinus rhythm Posterior infarct, old T wave inversions In inferior and lateral leads  similar to previous ECG of 03/03/2022 Confirmed by Estelle June [phone]) on 06/18/2023 8:46:33 AM  Radiology No results found.  Procedures Procedures    Medications Ordered in ED Medications  calcium carbonate (TUMS - dosed in mg elemental calcium) chewable tablet 800 mg of elemental calcium (has no administration in time range)  sodium chloride 0.9 % bolus 1,000 mL (1,000 mLs Intravenous New Bag/Given 06/18/23 0852)  diazepam (VALIUM) injection 2.5 mg (2.5 mg Intravenous Given 06/18/23 0850)    ED Course/ Medical Decision Making/ A&P Clinical  Course as of 06/18/23 0941  Sun Jun 18, 2023  0912 CK Total(!): 260 Laboratory workup notable for slightly elevated CK of 260 but not consistent with rhabdomyolysis.  Electrolytes show mild hyponatremia of 131 as well as slight hypocalcemia of 8.4.  No kidney injury.  CBC unremarkable.  Will administer oral calcium supplementation here in the ED and reevaluate [MP]  [phone] Reassessed patient.  She reports improvement in her discomfort after IV fluids and Valium here.  I reviewed the results of her blood work with her and her husband.  Discussed that she may want to speak with her PCP about simvastatin as this can cause myopathy.  Will instruct for PCP follow-up [MP]    Clinical Course User Index [MP] Royanne Foots, DO                                 Medical Decision Making 62 year old female with history as above presenting for atraumatic bilateral lower extremity pain.  Cramping in both feet started yesterday and has persisted since the onset now through both lower extremities.  No trauma or inciting injuries.  No myoclonus or rigidity on exam.  Motor strength sensation and circulation intact through both lower extremities.  Potential etiologies of lower extremity cramping include dehydration, electrolyte imbalance, rhabdomyolysis and adverse drug event.  Recently started on diclofenac a couple days ago.  It is unlikely this medication is  directly causing her myalgias but can inhibit renal function especially in a patient with diabetes which in turn can contribute to electrolyte abnormalities.  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  Will provide IV fluid for rehydration, check electrolytes and CK and trial antispasmodic here  Amount and/or Complexity of Data Reviewed Labs: ordered. Decision-making details documented in ED Course.  Risk OTC drugs. Prescription drug management.           Final Clinical Impression(s) / ED Diagnoses Final diagnoses:  Leg cramping  Hypocalcemia    Rx / DC Orders ED Discharge Orders     None         Royanne Foots, DO 06/18/23 [phone]

## 2023-06-18 NOTE — ED Triage Notes (Signed)
Pt c/o bilateral leg cramps in her calves and feet that have been coming and going since 1700 yesterday. Pt reports she does normally get cramps, but never for this long. When she had persistent leg cramps in the past it was due to a low vitamin D level.

## 2023-06-18 NOTE — Discharge Instructions (Addendum)
You were seen in the emergency room for leg cramps Your blood work showed low calcium and slightly low sodium One of your blood test that is a marker of muscle inflammation was slightly elevated as well Your symptoms improved after IV fluids and a muscle relaxer It is important that you keep well-hydrated at home You can start taking a couple times a day as this will help with low calcium Please follow-up with your primary care doctor to discuss today's visit You are on a cholesterol medication called simvastatin which may cause muscle pain Please discuss this with your primary care doctor Return to the emergency department for severe pain or any other concerns

## 2023-06-23 ENCOUNTER — Other Ambulatory Visit: Payer: Self-pay

## 2023-06-23 ENCOUNTER — Ambulatory Visit (HOSPITAL_COMMUNITY): Payer: BC Managed Care – PPO | Attending: Orthopedic Surgery

## 2023-06-23 DIAGNOSIS — R262 Difficulty in walking, not elsewhere classified: Secondary | ICD-10-CM | POA: Insufficient documentation

## 2023-06-23 DIAGNOSIS — G8929 Other chronic pain: Secondary | ICD-10-CM | POA: Diagnosis present

## 2023-06-23 DIAGNOSIS — M25562 Pain in left knee: Secondary | ICD-10-CM | POA: Insufficient documentation

## 2023-06-23 DIAGNOSIS — M6281 Muscle weakness (generalized): Secondary | ICD-10-CM | POA: Insufficient documentation

## 2023-06-23 NOTE — Therapy (Signed)
[facility] OUTPATIENT PHYSICAL THERAPY EVALUATION (LOWER EXTREMITY)   Patient Name: Sarah [name] MRN: [id number] DOB:March 10, 1961, 62 y.o., female Today's Date: 06/23/2023  END OF SESSION:   PT End of Session - 06/23/23 1114     Visit Number 1    Authorization Type [employer]    Authorization Time Period Auth requested 06/23/23    Progress Note Due on Visit 10    PT Start Time 1015    PT Stop Time 1055    PT Time Calculation (min) 40 min    Activity Tolerance Patient tolerated treatment well    Behavior During Therapy WFL for tasks assessed/performed              Past Medical History:  Diagnosis Date   Anxiety    Diabetes mellitus    type II-pt uses insulin pump   Diverticulosis    GERD (gastroesophageal reflux disease)    Hypercholesteremia    Hypertension    Insulin pump in place    DM type II   Past Surgical History:  Procedure Laterality Date   ABDOMINAL HYSTERECTOMY     complete   CARDIAC CATHETERIZATION  2011   COLONOSCOPY  01/04/2010   left sided transverse diverticula/two diminutive rectal polyps   COLONOSCOPY  03/28/2012   Surgeon: Corbin Ade, MD; riable anal canal with suspected trivial rectal bleeding from this location, colonic diverticulosis, s/p segmental resection, single diminutive polyp in the descending colon resected.  Pathology was benign.  Recommended 10-year repeat.   COLONOSCOPY N/A 09/11/2018   Surgeon: Franky Macho, MD;   moderate diverticulosis in the descending colon, otherwise normal exam.  Recommended 10-year repeat.   FOOT SURGERY     right-tendon repair   laparoscopic sigmoid colectomy  02/01/2010   Dr Jenkins-diverticulitis   TRIGGER FINGER RELEASE Left 03/2015   TRIGGER FINGER RELEASE Right 02/25/2016   Procedure: RIGHT LONG TRIGGER FINGER RELEASE;  Surgeon: Vickki Hearing, MD;  Location: [facility];  Service: Orthopedics;  Laterality: Right;   UMBILICAL HERNIA REPAIR N/A 02/05/2020   Procedure: UMBILICAL HERNIORRHAPY WITH MESH;   Surgeon: Franky Macho, MD;  Location: [facility];  Service: General;  Laterality: N/A;   Patient Active Problem List   Diagnosis Date Noted   Urinary frequency 10/14/2022   Type 2 diabetes mellitus with diabetic polyneuropathy, with long-term current use of insulin (HCC) 06/07/2022   Type 2 diabetes mellitus with hyperglycemia, with long-term current use of insulin (HCC) 06/07/2022   Acute pyelonephritis 03/05/2022   Hypoalbuminemia due to protein-calorie malnutrition (HCC) 03/05/2022   Lower abdominal pain 12/10/2021   Hernia of abdominal cavity 01/03/2021   Elevated LFTs 01/03/2021   History of severe acute respiratory syndrome coronavirus 2 (SARS-CoV-2) disease 01/03/2021   Hardening of the aorta (main artery of the heart) (HCC) 01/03/2021   Neuropathy 01/03/2021   Obesity 01/03/2021   Ventricular tachycardia (HCC) 01/03/2021   Umbilical hernia without obstruction and without gangrene    Special screening for malignant neoplasms, colon    Diverticulosis of large intestine without diverticulitis    History of palpitations in adulthood 07/02/2018   Murmur, cardiac 07/02/2018   History of chest pain 06/27/2018   Essential hypertension 06/27/2018   Trigger middle finger    Type 2 diabetes mellitus with obesity (HCC) 08/19/2015   Herpes simplex 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 08/19/2015   Visit for routine gyn exam 08/19/2015   Diabetes mellitus with hyperglycemia (HCC) 06/24/2013   Rectal bleed 03/23/2012   Mixed hyperlipidemia 11/30/2009   Anxiety  state 11/30/2009   Constipation 11/30/2009    PCP: Benita Stabile, MDPCP - General   REFERRING PROVIDER: Vickki Hearing, [facility] Provider   REFERRING DIAG: [phone] (ICD-10-CM) - Chronic pain of left knee   Rationale for Evaluation and Treatment: Rehabilitation  THERAPY DIAG:  No diagnosis found.  ONSET DATE: ~1 month --------------------------------------------------------------------------------------------- SUBJECTIVE:                                                                                                                                                                                            SUBJECTIVE STATEMENT: About 1 month ago, patient states left knee pain increased; no MOI specified. Patient went to MD 10/24 who performed x-ray; MD found + OA in the right knee. No cortisone shot performed. Patient referred to OPPT.   PERTINENT HISTORY:  DM II  Chronic left knee pain Knee    PAIN:  Are you having pain? Yes: NPRS scale: 4-5/10 Pain location: lateral left knee Pain description: throbbing Aggravating factors: walking Relieving factors: rest  PRECAUTIONS: None  RED FLAGS: None   WEIGHT BEARING RESTRICTIONS: No  FALLS:  Has patient fallen in last 6 months? No  OCCUPATION: retired Runner, broadcasting/film/video   PLOF: Independent  PATIENT GOALS: To walk pain-free   NEXT MD VISIT: Jan 23 --------------------------------------------------------------------------------------------- OBJECTIVE:   DIAGNOSTIC FINDINGS:  Narrative & Impression  Left knee pain    3 views left knee   Normal alignment   Osteophyte seen medially small   Patellofemoral alignment normal with what appears to be narrowing of the lateral facet and the lateral trochlea   Mild grade 2 disease definitive with possibility of joint space narrowing    PATIENT SURVEYS: Lower Extremity Functional Scale (LEFS): 32/80   SCREENING FOR RED FLAGS: Bowel or bladder incontinence: No Spinal tumors: No Cauda equina syndrome: No Compression fracture: No Abdominal aneurysm: No  COGNITION: Overall cognitive status: Within functional limits for tasks assessed  POSTURE: No Significant postural limitations      FUNCTIONAL TESTS:  2 minute walk test: 215 feet   GAIT ANALYSIS: Distance walked: 215  Assistive device utilized: Single point cane Level of assistance: SBA Comments: Patient with moderate antalgia on left lower extremity;  moderate weight shift to the right side; patient with decreased knee flexion through stance   SENSATION: WFL    LOWER EXTREMITY MMT:    MMT Right eval Left eval  Hip flexion 3+/5 3+/5  Hip extension    Hip abduction    Hip adduction    Hip internal rotation    Hip external rotation    Knee flexion 4-/5 3/5  Knee extension 4-/5  3/5  Ankle dorsiflexion 4-/5   Ankle plantarflexion    Ankle inversion    Ankle eversion     (Blank rows = not tested)  LOWER EXTREMITY ROM:     Active  Right eval Left eval  Hip flexion    Hip extension    Hip abduction    Hip adduction    Hip internal rotation    Hip external rotation    Knee flexion  0-110 pain  Knee extension  (3)  Ankle dorsiflexion    Ankle plantarflexion    Ankle inversion    Ankle eversion     (Blank rows = not tested)  LOWER EXTREMITY SPECIAL TESTS + Varus stress test for pain, left LCL      PALPATION: Moderate tenderness to palpation left LCL   --------------------------------------------------------------------------------------------- TODAY'S TREATMENT:                                                                                                                              DATE:   06/23/23 PT Initial Eval  Gait Training   Gait Training with right UE single point cane 2 point step-to   PATIENT EDUCATION:  Education details: assistive device use, pain management, activity modification  Person educated: Patient Education method: Explanation Education comprehension: verbalized understanding  HOME EXERCISE PROGRAM: TBD --------------------------------------------------------------------------------------------- ASSESSMENT:  CLINICAL IMPRESSION: Patient is a 62 y.o. y.o. female who was seen today for physical therapy evaluation and treatment for left knee pain, difficulty walking . Patient presents to PT with the following objective impairments: Abnormal gait, decreased activity tolerance,  decreased endurance, decreased mobility, difficulty walking, decreased strength, improper body mechanics, and pain. These impairments limit the patient in activities such as carrying, lifting, bending, standing, squatting, stairs, transfers, and locomotion level. These impairments also limit the patient in participation such as meal prep, cleaning, laundry, shopping, community activity, and yard work. The patient will benefit from PT to address the limitations/impairments listed below to return to their prior level of function in the domains of activity and participation.    PERSONAL FACTORS:  n/a  are also affecting patient's functional outcome.   REHAB POTENTIAL: Good  CLINICAL DECISION MAKING: Stable/uncomplicated  EVALUATION COMPLEXITY: Low  --------------------------------------------------------------------------------------------- GOALS: Goals reviewed with patient? No  SHORT TERM GOALS: Target date: 07/14/2023    Patient will score a >/= 40/80 on the LEFS    to demonstrate a Minimally Clinically Importance Difference (MCID) in ADL completion, home/community ambulation, and lifting/bending/squatting.  Baseline: Goal status: INITIAL   2. Patient will be independent with a basic stretching/strengthening HEP  Baseline:  Goal status: INITIAL   LONG TERM GOALS: Target date: 08/04/2023    Patient will score a >/= 48/80 on the LEFS    to demonstrate a Minimally Clinically Importance Difference (MCID) in ADL completion, home/community ambulation, and lifting/bending/squatting. Baseline:  Goal status: INITIAL  2.  Patient will complete >/= 245 feet on the  2 minute walk test:  to demonstrate an improvement in ADL completion, stair negotiation, household/community ambulation, and self-care Baseline:  Goal status: INITIAL  3.  Patient will be independent with a comprehensive strengthening HEP  Baseline:  Goal status: INITIAL  4. Patient will be able to demonstrate left knee  flexion active range of motion to 0-120 degrees to facilitate ADL completion, lifting, squatting. Baseline:  Goal status: INITIAL  --------------------------------------------------------------------------------------------- PLAN:  PT FREQUENCY: 1-2x/week  PT DURATION: 6 weeks  PLANNED INTERVENTIONS: 97110-Therapeutic exercises, 97530- Therapeutic activity, 97112- Neuromuscular re-education, 97535- Self Care, 16109- Manual therapy, [phone]- Gait training, Patient/Family education, Balance training, Stair training, Dry Needling, Joint mobilization, Joint manipulation, Spinal manipulation, Spinal mobilization, Cryotherapy, and Moist heat.  PLAN FOR NEXT SESSION: Continue with progression of lower extremity strength exercises    Seymour Bars, PT 06/23/2023, 11:22 AM

## 2023-06-23 NOTE — Telephone Encounter (Signed)
Pharmacy Patient Advocate Encounter  Received notification from [facility] that Prior Authorization for Sarah [name] has been APPROVED through 06/14/2026   PA #/Case ID/Reference #: [id number]

## 2023-06-28 ENCOUNTER — Ambulatory Visit (HOSPITAL_COMMUNITY): Payer: BC Managed Care – PPO

## 2023-06-28 DIAGNOSIS — R262 Difficulty in walking, not elsewhere classified: Secondary | ICD-10-CM

## 2023-06-28 DIAGNOSIS — M25562 Pain in left knee: Secondary | ICD-10-CM | POA: Diagnosis not present

## 2023-06-28 DIAGNOSIS — G8929 Other chronic pain: Secondary | ICD-10-CM

## 2023-06-28 DIAGNOSIS — M6281 Muscle weakness (generalized): Secondary | ICD-10-CM

## 2023-06-28 NOTE — Therapy (Signed)
OUTPATIENT PHYSICAL THERAPY TREATMENT   Patient Name: Sarah [name] MRN: [id number] DOB:[birth year], 62 y.o., female Today's Date: 06/28/2023  END OF SESSION:   PT End of Session - 06/28/23 1154     Visit Number 2    Authorization Type BCBS    Authorization Time Period No auth    Progress Note Due on Visit 10    PT Start Time 1145    PT Stop Time 1225    PT Time Calculation (min) 40 min    Activity Tolerance Patient tolerated treatment well    Behavior During Therapy WFL for tasks assessed/performed              Past Medical History:  Diagnosis Date   Anxiety    Diabetes mellitus    type II-pt uses insulin pump   Diverticulosis    GERD (gastroesophageal reflux disease)    Hypercholesteremia    Hypertension    Insulin pump in place    DM type II   Past Surgical History:  Procedure Laterality Date   ABDOMINAL HYSTERECTOMY     complete   CARDIAC CATHETERIZATION  2011   COLONOSCOPY  01/04/2010   left sided transverse diverticula/two diminutive rectal polyps   COLONOSCOPY  03/28/2012   Surgeon: Corbin Ade, MD; riable anal canal with suspected trivial rectal bleeding from this location, colonic diverticulosis, s/p segmental resection, single diminutive polyp in the descending colon resected.  Pathology was benign.  Recommended 10-year repeat.   COLONOSCOPY N/A 09/11/2018   Surgeon: Franky Macho, MD;   moderate diverticulosis in the descending colon, otherwise normal exam.  Recommended 10-year repeat.   FOOT SURGERY     right-tendon repair   laparoscopic sigmoid colectomy  02/01/2010   Dr Jenkins-diverticulitis   TRIGGER FINGER RELEASE Left 03/2015   TRIGGER FINGER RELEASE Right 02/25/2016   Procedure: RIGHT LONG TRIGGER FINGER RELEASE;  Surgeon: Vickki Hearing, MD;  Location: [facility];  Service: Orthopedics;  Laterality: Right;   UMBILICAL HERNIA REPAIR N/A 02/05/2020   Procedure: UMBILICAL HERNIORRHAPY WITH MESH;  Surgeon: Franky Macho, MD;  Location: [facility];  Service: General;  Laterality: N/A;   Patient Active Problem List   Diagnosis Date Noted   Urinary frequency 10/14/2022   Type 2 diabetes mellitus with diabetic polyneuropathy, with long-term current use of insulin (HCC) 06/07/2022   Type 2 diabetes mellitus with hyperglycemia, with long-term current use of insulin (HCC) 06/07/2022   Acute pyelonephritis 03/05/2022   Hypoalbuminemia due to protein-calorie malnutrition (HCC) 03/05/2022   Lower abdominal pain 12/10/2021   Hernia of abdominal cavity 01/03/2021   Elevated LFTs 01/03/2021   History of severe acute respiratory syndrome coronavirus 2 (SARS-CoV-2) disease 01/03/2021   Hardening of the aorta (main artery of the heart) (HCC) 01/03/2021   Neuropathy 01/03/2021   Obesity 01/03/2021   Ventricular tachycardia (HCC) 01/03/2021   Umbilical hernia without obstruction and without gangrene    Special screening for malignant neoplasms, colon    Diverticulosis of large intestine without diverticulitis    History of palpitations in adulthood 07/02/2018   Murmur, cardiac 07/02/2018   History of chest pain 06/27/2018   Essential hypertension 06/27/2018   Trigger middle finger    Type 2 diabetes mellitus with obesity (HCC) 08/19/2015   Herpes simplex 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 08/19/2015   Visit for routine gyn exam 08/19/2015   Diabetes mellitus with hyperglycemia (HCC) 06/24/2013   Rectal bleed 03/23/2012   Mixed hyperlipidemia 11/30/2009   Anxiety state 11/30/2009  Constipation 11/30/2009    PCP: Benita Stabile, MDPCP - General   REFERRING PROVIDER: Vickki Hearing, [facility] Provider   REFERRING DIAG: [phone] (ICD-10-CM) - Chronic pain of left knee   Rationale for Evaluation and Treatment: Rehabilitation  THERAPY DIAG:  No diagnosis found.  ONSET DATE: ~1 month --------------------------------------------------------------------------------------------- SUBJECTIVE:                                                                                                                                                                                            SUBJECTIVE STATEMENT:  Patient with 7/10 in the left knee. Patient attempted using her single point cane over the weekend and reports it helped with pain management    IE: About 1 month ago, patient states left knee pain increased; no MOI specified. Patient went to MD 10/24 who performed x-ray; MD found + OA in the right knee. No cortisone shot performed. Patient referred to OPPT.   PERTINENT HISTORY:  DM II  Chronic left knee pain Knee    PAIN:  Are you having pain? Yes: NPRS scale: 4-5/10 Pain location: lateral left knee Pain description: throbbing Aggravating factors: walking Relieving factors: rest  PRECAUTIONS: None  RED FLAGS: None   WEIGHT BEARING RESTRICTIONS: No  FALLS:  Has patient fallen in last 6 months? No  OCCUPATION: retired Runner, broadcasting/film/video   PLOF: Independent  PATIENT GOALS: To walk pain-free   NEXT MD VISIT: Jan 23 --------------------------------------------------------------------------------------------- OBJECTIVE:   DIAGNOSTIC FINDINGS:  Narrative & Impression  Left knee pain    3 views left knee   Normal alignment   Osteophyte seen medially small   Patellofemoral alignment normal with what appears to be narrowing of the lateral facet and the lateral trochlea   Mild grade 2 disease definitive with possibility of joint space narrowing    PATIENT SURVEYS: Lower Extremity Functional Scale (LEFS): 32/80   SCREENING FOR RED FLAGS: Bowel or bladder incontinence: No Spinal tumors: No Cauda equina syndrome: No Compression fracture: No Abdominal aneurysm: No  COGNITION: Overall cognitive status: Within functional limits for tasks assessed  POSTURE: No Significant postural limitations      FUNCTIONAL TESTS:  2 minute walk test: 215 feet   GAIT ANALYSIS: Distance walked: 215  Assistive device utilized:  Single point cane Level of assistance: SBA Comments: Patient with moderate antalgia on left lower extremity; moderate weight shift to the right side; patient with decreased knee flexion through stance   SENSATION: WFL    LOWER EXTREMITY MMT:    MMT Right eval Left eval  Hip flexion 3+/5 3+/5  Hip extension    Hip abduction  Hip adduction    Hip internal rotation    Hip external rotation    Knee flexion 4-/5 3/5  Knee extension 4-/5 3/5  Ankle dorsiflexion 4-/5   Ankle plantarflexion    Ankle inversion    Ankle eversion     (Blank rows = not tested)  LOWER EXTREMITY ROM:     Active  Right eval Left eval  Hip flexion    Hip extension    Hip abduction    Hip adduction    Hip internal rotation    Hip external rotation    Knee flexion  0-110 pain  Knee extension  (3)  Ankle dorsiflexion    Ankle plantarflexion    Ankle inversion    Ankle eversion     (Blank rows = not tested)  LOWER EXTREMITY SPECIAL TESTS + Varus stress test for pain, left LCL      PALPATION: Moderate tenderness to palpation left LCL   --------------------------------------------------------------------------------------------- TODAY'S TREATMENT:                                                                                                                              DATE:   06/28/23 Supine   MATRIX bike warm up L2  Manual therapy to left knee: PROM, gentle soft tissue mobilization, mobilization with movement into knee flexion   AAROM left knee flexion + strap  Quad sets  SAQ with 2#  06/23/23 PT Initial Eval  Gait Training   Gait Training with right UE single point cane 2 point step-to   PATIENT EDUCATION:  Education details: assistive device use, pain management, activity modification  Person educated: Patient Education method: Explanation Education comprehension: verbalized understanding  HOME EXERCISE  PROGRAM: TBD --------------------------------------------------------------------------------------------- ASSESSMENT:  CLINICAL IMPRESSION: Today: Patient ambulates into clinic without assistive device today. PT began patient with bike then proceeded to supine activities. Patient still with moderate tenderness to palpation on her left LCL which is limiting pain-free knee ROM. PT treatment included manual therapy to decrease pain followed by basic knee ROM/ strengthening into knee extension. Patient able to tolerate well with minimal increase in discomfort.  Patient will continue to benefit from PT to return to prior level of function     IE: Patient is a 62 y.o. y.o. female who was seen today for physical therapy evaluation and treatment for left knee pain, difficulty walking . Patient presents to PT with the following objective impairments: Abnormal gait, decreased activity tolerance, decreased endurance, decreased mobility, difficulty walking, decreased strength, improper body mechanics, and pain. These impairments limit the patient in activities such as carrying, lifting, bending, standing, squatting, stairs, transfers, and locomotion level. These impairments also limit the patient in participation such as meal prep, cleaning, laundry, shopping, community activity, and yard work. The patient will benefit from PT to address the limitations/impairments listed below to return to their prior level of function in the domains of activity and participation.    PERSONAL FACTORS:  n/a  are also affecting patient's functional outcome.   REHAB POTENTIAL: Good  CLINICAL DECISION MAKING: Stable/uncomplicated  EVALUATION COMPLEXITY: Low  --------------------------------------------------------------------------------------------- GOALS: Goals reviewed with patient? No  SHORT TERM GOALS: Target date: 07/14/2023    Patient will score a >/= 40/80 on the LEFS    to demonstrate a Minimally Clinically  Importance Difference (MCID) in ADL completion, home/community ambulation, and lifting/bending/squatting.  Baseline: Goal status: INITIAL   2. Patient will be independent with a basic stretching/strengthening HEP  Baseline:  Goal status: INITIAL   LONG TERM GOALS: Target date: 08/04/2023    Patient will score a >/= 48/80 on the LEFS    to demonstrate a Minimally Clinically Importance Difference (MCID) in ADL completion, home/community ambulation, and lifting/bending/squatting. Baseline:  Goal status: INITIAL  2.  Patient will complete >/= 245 feet on the  2 minute walk test:    to demonstrate an improvement in ADL completion, stair negotiation, household/community ambulation, and self-care Baseline:  Goal status: INITIAL  3.  Patient will be independent with a comprehensive strengthening HEP  Baseline:  Goal status: INITIAL  4. Patient will be able to demonstrate left knee flexion active range of motion to 0-120 degrees to facilitate ADL completion, lifting, squatting. Baseline:  Goal status: INITIAL  --------------------------------------------------------------------------------------------- PLAN:  PT FREQUENCY: 1-2x/week  PT DURATION: 6 weeks  PLANNED INTERVENTIONS: 97110-Therapeutic exercises, 97530- Therapeutic activity, 97112- Neuromuscular re-education, 97535- Self Care, 29562- Manual therapy, [phone]- Gait training, Patient/Family education, Balance training, Stair training, Dry Needling, Joint mobilization, Joint manipulation, Spinal manipulation, Spinal mobilization, Cryotherapy, and Moist heat.  PLAN FOR NEXT SESSION: Continue with progression of lower extremity strength exercises    Seymour Bars, PT 06/28/2023, 11:55 AM

## 2023-06-30 ENCOUNTER — Ambulatory Visit (HOSPITAL_COMMUNITY): Payer: BC Managed Care – PPO

## 2023-06-30 DIAGNOSIS — M6281 Muscle weakness (generalized): Secondary | ICD-10-CM

## 2023-06-30 DIAGNOSIS — M25562 Pain in left knee: Secondary | ICD-10-CM | POA: Diagnosis not present

## 2023-06-30 DIAGNOSIS — R262 Difficulty in walking, not elsewhere classified: Secondary | ICD-10-CM

## 2023-06-30 DIAGNOSIS — G8929 Other chronic pain: Secondary | ICD-10-CM

## 2023-06-30 NOTE — Therapy (Signed)
OUTPATIENT PHYSICAL THERAPY TREATMENT   Patient Name: Sarah [name] MRN: [id number] DOB:1961/07/10, 62 y.o., female Today's Date: 06/30/2023  END OF SESSION:   PT End of Session - 06/30/23 0942     Visit Number 3    Authorization Type BCBS    Authorization Time Period No auth    Progress Note Due on Visit 10    PT Start Time 0934    PT Stop Time 1014    PT Time Calculation (min) 40 min    Activity Tolerance Patient tolerated treatment well    Behavior During Therapy WFL for tasks assessed/performed              Past Medical History:  Diagnosis Date   Anxiety    Diabetes mellitus    type II-pt uses insulin pump   Diverticulosis    GERD (gastroesophageal reflux disease)    Hypercholesteremia    Hypertension    Insulin pump in place    DM type II   Past Surgical History:  Procedure Laterality Date   ABDOMINAL HYSTERECTOMY     complete   CARDIAC CATHETERIZATION  2011   COLONOSCOPY  01/04/2010   left sided transverse diverticula/two diminutive rectal polyps   COLONOSCOPY  03/28/2012   Surgeon: Corbin Ade, MD; riable anal canal with suspected trivial rectal bleeding from this location, colonic diverticulosis, s/p segmental resection, single diminutive polyp in the descending colon resected.  Pathology was benign.  Recommended 10-year repeat.   COLONOSCOPY N/A 09/11/2018   Surgeon: Franky Macho, MD;   moderate diverticulosis in the descending colon, otherwise normal exam.  Recommended 10-year repeat.   FOOT SURGERY     right-tendon repair   laparoscopic sigmoid colectomy  02/01/2010   Dr Jenkins-diverticulitis   TRIGGER FINGER RELEASE Left 03/2015   TRIGGER FINGER RELEASE Right 02/25/2016   Procedure: RIGHT LONG TRIGGER FINGER RELEASE;  Surgeon: Vickki Hearing, MD;  Location: [facility];  Service: Orthopedics;  Laterality: Right;   UMBILICAL HERNIA REPAIR N/A 02/05/2020   Procedure: UMBILICAL HERNIORRHAPY WITH MESH;  Surgeon: Franky Macho, MD;  Location: [facility];  Service: General;  Laterality: N/A;   Patient Active Problem List   Diagnosis Date Noted   Urinary frequency 10/14/2022   Type 2 diabetes mellitus with diabetic polyneuropathy, with long-term current use of insulin (HCC) 06/07/2022   Type 2 diabetes mellitus with hyperglycemia, with long-term current use of insulin (HCC) 06/07/2022   Acute pyelonephritis 03/05/2022   Hypoalbuminemia due to protein-calorie malnutrition (HCC) 03/05/2022   Lower abdominal pain 12/10/2021   Hernia of abdominal cavity 01/03/2021   Elevated LFTs 01/03/2021   History of severe acute respiratory syndrome coronavirus 2 (SARS-CoV-2) disease 01/03/2021   Hardening of the aorta (main artery of the heart) (HCC) 01/03/2021   Neuropathy 01/03/2021   Obesity 01/03/2021   Ventricular tachycardia (HCC) 01/03/2021   Umbilical hernia without obstruction and without gangrene    Special screening for malignant neoplasms, colon    Diverticulosis of large intestine without diverticulitis    History of palpitations in adulthood 07/02/2018   Murmur, cardiac 07/02/2018   History of chest pain 06/27/2018   Essential hypertension 06/27/2018   Trigger middle finger    Type 2 diabetes mellitus with obesity (HCC) 08/19/2015   Herpes simplex 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 08/19/2015   Visit for routine gyn exam 08/19/2015   Diabetes mellitus with hyperglycemia (HCC) 06/24/2013   Rectal bleed 03/23/2012   Mixed hyperlipidemia 11/30/2009   Anxiety state 11/30/2009  Constipation 11/30/2009    PCP: Benita Stabile, MDPCP - General   REFERRING PROVIDER: Vickki Hearing, [facility] Provider   REFERRING DIAG: [phone] (ICD-10-CM) - Chronic pain of left knee   Rationale for Evaluation and Treatment: Rehabilitation  THERAPY DIAG:  No diagnosis found.  ONSET DATE: ~1 month --------------------------------------------------------------------------------------------- SUBJECTIVE:                                                                                                                                                                                            SUBJECTIVE STATEMENT:  Patient with 7/10 in the left knee. Patient attempted using her single point cane over the weekend and reports it helped with pain management    IE: About 1 month ago, patient states left knee pain increased; no MOI specified. Patient went to MD 10/24 who performed x-ray; MD found + OA in the right knee. No cortisone shot performed. Patient referred to OPPT.   PERTINENT HISTORY:  DM II  Chronic left knee pain Knee    PAIN:  Are you having pain? Yes: NPRS scale: 4-5/10 Pain location: lateral left knee Pain description: throbbing Aggravating factors: walking Relieving factors: rest  PRECAUTIONS: None  RED FLAGS: None   WEIGHT BEARING RESTRICTIONS: No  FALLS:  Has patient fallen in last 6 months? No  OCCUPATION: retired Runner, broadcasting/film/video   PLOF: Independent  PATIENT GOALS: To walk pain-free   NEXT MD VISIT: Jan 23 --------------------------------------------------------------------------------------------- OBJECTIVE:   DIAGNOSTIC FINDINGS:  Narrative & Impression  Left knee pain    3 views left knee   Normal alignment   Osteophyte seen medially small   Patellofemoral alignment normal with what appears to be narrowing of the lateral facet and the lateral trochlea   Mild grade 2 disease definitive with possibility of joint space narrowing    PATIENT SURVEYS: Lower Extremity Functional Scale (LEFS): 32/80   SCREENING FOR RED FLAGS: Bowel or bladder incontinence: No Spinal tumors: No Cauda equina syndrome: No Compression fracture: No Abdominal aneurysm: No  COGNITION: Overall cognitive status: Within functional limits for tasks assessed  POSTURE: No Significant postural limitations      FUNCTIONAL TESTS:  2 minute walk test: 215 feet   GAIT ANALYSIS: Distance walked: 215  Assistive device utilized:  Single point cane Level of assistance: SBA Comments: Patient with moderate antalgia on left lower extremity; moderate weight shift to the right side; patient with decreased knee flexion through stance   SENSATION: WFL    LOWER EXTREMITY MMT:    MMT Right eval Left eval  Hip flexion 3+/5 3+/5  Hip extension    Hip abduction  Hip adduction    Hip internal rotation    Hip external rotation    Knee flexion 4-/5 3/5  Knee extension 4-/5 3/5  Ankle dorsiflexion 4-/5   Ankle plantarflexion    Ankle inversion    Ankle eversion     (Blank rows = not tested)  LOWER EXTREMITY ROM:     Active  Right eval Left eval  Hip flexion    Hip extension    Hip abduction    Hip adduction    Hip internal rotation    Hip external rotation    Knee flexion  0-110 pain  Knee extension  (3)  Ankle dorsiflexion    Ankle plantarflexion    Ankle inversion    Ankle eversion     (Blank rows = not tested)  LOWER EXTREMITY SPECIAL TESTS + Varus stress test for pain, left LCL      PALPATION: Moderate tenderness to palpation left LCL   --------------------------------------------------------------------------------------------- TODAY'S TREATMENT:                                                                                                                              DATE:   06/30/23 MATRIX bike warm up L2 Supine  DKTC with Green physioball   SAQs with 3# Seated  Leg press 40# 2x10  Leg curl with 40# 2x10   06/28/23 MATRIX bike warm up L2 Supine   Manual therapy to left knee: PROM, gentle soft tissue mobilization, mobilization with movement into knee flexion   AAROM left knee flexion + strap  Quad sets  SAQ with 2#  06/23/23 PT Initial Eval  Gait Training   Gait Training with right UE single point cane 2 point step-to   PATIENT EDUCATION:  Education details: assistive device use, pain management, activity modification  Person educated: Patient Education method:  Explanation Education comprehension: verbalized understanding  HOME EXERCISE PROGRAM: TBD --------------------------------------------------------------------------------------------- ASSESSMENT:  CLINICAL IMPRESSION: Today: Patient ambulates into clinic with single point cane today. PT proceeded with lower extremity strengthening; introduced lower extremity machines today. Patient less tenderness to palpation on her left knee.  Patient able to tolerate well with minimal increase in discomfort.  Patient will continue to benefit from PT to return to prior level of function     IE: Patient is a 62 y.o. y.o. female who was seen today for physical therapy evaluation and treatment for left knee pain, difficulty walking . Patient presents to PT with the following objective impairments: Abnormal gait, decreased activity tolerance, decreased endurance, decreased mobility, difficulty walking, decreased strength, improper body mechanics, and pain. These impairments limit the patient in activities such as carrying, lifting, bending, standing, squatting, stairs, transfers, and locomotion level. These impairments also limit the patient in participation such as meal prep, cleaning, laundry, shopping, community activity, and yard work. The patient will benefit from PT to address the limitations/impairments listed below to return to their prior level of function in the domains of activity and  participation.    PERSONAL FACTORS:  n/a  are also affecting patient's functional outcome.   REHAB POTENTIAL: Good  CLINICAL DECISION MAKING: Stable/uncomplicated  EVALUATION COMPLEXITY: Low  --------------------------------------------------------------------------------------------- GOALS: Goals reviewed with patient? No  SHORT TERM GOALS: Target date: 07/14/2023    Patient will score a >/= 40/80 on the LEFS    to demonstrate a Minimally Clinically Importance Difference (MCID) in ADL completion, home/community  ambulation, and lifting/bending/squatting.  Baseline: Goal status: INITIAL   2. Patient will be independent with a basic stretching/strengthening HEP  Baseline:  Goal status: INITIAL   LONG TERM GOALS: Target date: 08/04/2023    Patient will score a >/= 48/80 on the LEFS    to demonstrate a Minimally Clinically Importance Difference (MCID) in ADL completion, home/community ambulation, and lifting/bending/squatting. Baseline:  Goal status: INITIAL  2.  Patient will complete >/= 245 feet on the  2 minute walk test:    to demonstrate an improvement in ADL completion, stair negotiation, household/community ambulation, and self-care Baseline:  Goal status: INITIAL  3.  Patient will be independent with a comprehensive strengthening HEP  Baseline:  Goal status: INITIAL  4. Patient will be able to demonstrate left knee flexion active range of motion to 0-120 degrees to facilitate ADL completion, lifting, squatting. Baseline:  Goal status: INITIAL  --------------------------------------------------------------------------------------------- PLAN:  PT FREQUENCY: 1-2x/week  PT DURATION: 6 weeks  PLANNED INTERVENTIONS: 97110-Therapeutic exercises, 97530- Therapeutic activity, 97112- Neuromuscular re-education, 97535- Self Care, [id number]- Manual therapy, [phone]- Gait training, Patient/Family education, Balance training, Stair training, Dry Needling, Joint mobilization, Joint manipulation, Spinal manipulation, Spinal mobilization, Cryotherapy, and Moist heat.  PLAN FOR NEXT SESSION: Continue with progression of lower extremity strength exercises    Seymour Bars, PT 06/30/2023, 9:42 AM

## 2023-07-05 ENCOUNTER — Ambulatory Visit (HOSPITAL_COMMUNITY): Payer: BC Managed Care – PPO

## 2023-07-05 DIAGNOSIS — M6281 Muscle weakness (generalized): Secondary | ICD-10-CM

## 2023-07-05 DIAGNOSIS — M25562 Pain in left knee: Secondary | ICD-10-CM | POA: Diagnosis not present

## 2023-07-05 DIAGNOSIS — G8929 Other chronic pain: Secondary | ICD-10-CM

## 2023-07-05 DIAGNOSIS — R262 Difficulty in walking, not elsewhere classified: Secondary | ICD-10-CM

## 2023-07-05 NOTE — Therapy (Signed)
OUTPATIENT PHYSICAL THERAPY TREATMENT   Patient Name: Sarah [name] MRN: [id number] DOB:11-07-1960, 62 y.o., female Today's Date: 07/05/2023  END OF SESSION:   PT End of Session - 07/05/23 0936     Visit Number 4    Authorization Type BCBS    Authorization Time Period No auth    Progress Note Due on Visit 10    PT Start Time 0932    PT Stop Time 1012    PT Time Calculation (min) 40 min    Activity Tolerance Patient tolerated treatment well    Behavior During Therapy WFL for tasks assessed/performed              Past Medical History:  Diagnosis Date   Anxiety    Diabetes mellitus    type II-pt uses insulin pump   Diverticulosis    GERD (gastroesophageal reflux disease)    Hypercholesteremia    Hypertension    Insulin pump in place    DM type II   Past Surgical History:  Procedure Laterality Date   ABDOMINAL HYSTERECTOMY     complete   CARDIAC CATHETERIZATION  2011   COLONOSCOPY  01/04/2010   left sided transverse diverticula/two diminutive rectal polyps   COLONOSCOPY  03/28/2012   Surgeon: Corbin Ade, MD; riable anal canal with suspected trivial rectal bleeding from this location, colonic diverticulosis, s/p segmental resection, single diminutive polyp in the descending colon resected.  Pathology was benign.  Recommended 10-year repeat.   COLONOSCOPY N/A 09/11/2018   Surgeon: Franky Macho, MD;   moderate diverticulosis in the descending colon, otherwise normal exam.  Recommended 10-year repeat.   FOOT SURGERY     right-tendon repair   laparoscopic sigmoid colectomy  02/01/2010   Dr Jenkins-diverticulitis   TRIGGER FINGER RELEASE Left 03/2015   TRIGGER FINGER RELEASE Right 02/25/2016   Procedure: RIGHT LONG TRIGGER FINGER RELEASE;  Surgeon: Vickki Hearing, MD;  Location: [facility];  Service: Orthopedics;  Laterality: Right;   UMBILICAL HERNIA REPAIR N/A 02/05/2020   Procedure: UMBILICAL HERNIORRHAPY WITH MESH;  Surgeon: Franky Macho, MD;  Location: [facility];  Service: General;  Laterality: N/A;   Patient Active Problem List   Diagnosis Date Noted   Urinary frequency 10/14/2022   Type 2 diabetes mellitus with diabetic polyneuropathy, with long-term current use of insulin (HCC) 06/07/2022   Type 2 diabetes mellitus with hyperglycemia, with long-term current use of insulin (HCC) 06/07/2022   Acute pyelonephritis 03/05/2022   Hypoalbuminemia due to protein-calorie malnutrition (HCC) 03/05/2022   Lower abdominal pain 12/10/2021   Hernia of abdominal cavity 01/03/2021   Elevated LFTs 01/03/2021   History of severe acute respiratory syndrome coronavirus 2 (SARS-CoV-2) disease 01/03/2021   Hardening of the aorta (main artery of the heart) (HCC) 01/03/2021   Neuropathy 01/03/2021   Obesity 01/03/2021   Ventricular tachycardia (HCC) 01/03/2021   Umbilical hernia without obstruction and without gangrene    Special screening for malignant neoplasms, colon    Diverticulosis of large intestine without diverticulitis    History of palpitations in adulthood 07/02/2018   Murmur, cardiac 07/02/2018   History of chest pain 06/27/2018   Essential hypertension 06/27/2018   Trigger middle finger    Type 2 diabetes mellitus with obesity (HCC) 08/19/2015   Herpes simplex 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 08/19/2015   Visit for routine gyn exam 08/19/2015   Diabetes mellitus with hyperglycemia (HCC) 06/24/2013   Rectal bleed 03/23/2012   Mixed hyperlipidemia 11/30/2009   Anxiety state 11/30/2009  Constipation 11/30/2009    PCP: Benita Stabile, MDPCP - General   REFERRING PROVIDER: Vickki Hearing, MDRef Provider   REFERRING DIAG: [phone] (ICD-10-CM) - Chronic pain of left knee   Rationale for Evaluation and Treatment: Rehabilitation  THERAPY DIAG:  Chronic pain of left knee  Difficulty in walking, not elsewhere classified  Muscle weakness (generalized)  ONSET DATE: ~1  month --------------------------------------------------------------------------------------------- SUBJECTIVE:                                                                                                                                                                                           SUBJECTIVE STATEMENT:  Today: Patient with 1-2/1- in left knee; states she feels more confident walking on the left lower extremity since starting PT    IE: About 1 month ago, patient states left knee pain increased; no MOI specified. Patient went to MD 10/24 who performed x-ray; MD found + OA in the right knee. No cortisone shot performed. Patient referred to OPPT.   PERTINENT HISTORY:  DM II  Chronic left knee pain Knee    PAIN:  Are you having pain? Yes: NPRS scale: 4-5/10 Pain location: lateral left knee Pain description: throbbing Aggravating factors: walking Relieving factors: rest  PRECAUTIONS: None  RED FLAGS: None   WEIGHT BEARING RESTRICTIONS: No  FALLS:  Has patient fallen in last 6 months? No  OCCUPATION: retired Runner, broadcasting/film/video   PLOF: Independent  PATIENT GOALS: To walk pain-free   NEXT MD VISIT: Jan 23 --------------------------------------------------------------------------------------------- OBJECTIVE:   DIAGNOSTIC FINDINGS:  Narrative & Impression  Left knee pain    3 views left knee   Normal alignment   Osteophyte seen medially small   Patellofemoral alignment normal with what appears to be narrowing of the lateral facet and the lateral trochlea   Mild grade 2 disease definitive with possibility of joint space narrowing    PATIENT SURVEYS: Lower Extremity Functional Scale (LEFS): 32/80   SCREENING FOR RED FLAGS: Bowel or bladder incontinence: No Spinal tumors: No Cauda equina syndrome: No Compression fracture: No Abdominal aneurysm: No  COGNITION: Overall cognitive status: Within functional limits for tasks assessed  POSTURE: No Significant  postural limitations      FUNCTIONAL TESTS:  2 minute walk test: 215 feet   GAIT ANALYSIS: Distance walked: 215  Assistive device utilized: Single point cane Level of assistance: SBA Comments: Patient with moderate antalgia on left lower extremity; moderate weight shift to the right side; patient with decreased knee flexion through stance   SENSATION: WFL    LOWER EXTREMITY MMT:    MMT Right eval Left eval  Hip flexion 3+/5 3+/5  Hip extension    Hip abduction    Hip adduction    Hip internal rotation    Hip external rotation    Knee flexion 4-/5 3/5  Knee extension 4-/5 3/5  Ankle dorsiflexion 4-/5   Ankle plantarflexion    Ankle inversion    Ankle eversion     (Blank rows = not tested)  LOWER EXTREMITY ROM:     Active  Right eval Left eval  Hip flexion    Hip extension    Hip abduction    Hip adduction    Hip internal rotation    Hip external rotation    Knee flexion  0-110 pain  Knee extension  (3)  Ankle dorsiflexion    Ankle plantarflexion    Ankle inversion    Ankle eversion     (Blank rows = not tested)  LOWER EXTREMITY SPECIAL TESTS + Varus stress test for pain, left LCL      PALPATION: Moderate tenderness to palpation left LCL   --------------------------------------------------------------------------------------------- TODAY'S TREATMENT:                                                                                                                              DATE:   07/05/23 MATRIX bike warm up L2 seat 10 Seated  Leg press 50# 2x10  Leg curl with 50# 2x10 Supine  SAQs with 5# 3x10  Hip bridges 2x10  DKTC with G physioball 2x10   Bridges with G physioball 2x10  06/30/23 MATRIX bike warm up L2 Supine  DKTC with Green physioball   SAQs with 3# Seated  Leg press 40# 2x10  Leg curl with 40# 2x10   06/28/23 MATRIX bike warm up L2 Supine   Manual therapy to left knee: PROM, gentle soft tissue mobilization, mobilization  with movement into knee flexion   AAROM left knee flexion + strap  Quad sets  SAQ with 2#   PATIENT EDUCATION:  Education details: assistive device use, pain management, activity modification  Person educated: Patient Education method: Explanation Education comprehension: verbalized understanding  HOME EXERCISE PROGRAM: TBD --------------------------------------------------------------------------------------------- ASSESSMENT:  CLINICAL IMPRESSION: Today: PT progressed with lower extremity strengthening today. Patient able to tolerate added weight by 10# to all lower extremity machines; tolerated will with no pain. Requires minimal verbal cueing for exercise progression    Patient will continue to benefit from PT to return to prior level of function     IE: Patient is a 62 y.o. y.o. female who was seen today for physical therapy evaluation and treatment for left knee pain, difficulty walking . Patient presents to PT with the following objective impairments: Abnormal gait, decreased activity tolerance, decreased endurance, decreased mobility, difficulty walking, decreased strength, improper body mechanics, and pain. These impairments limit the patient in activities such as carrying, lifting, bending, standing, squatting, stairs, transfers, and locomotion level. These impairments also limit the patient in participation such as meal prep, cleaning, laundry, shopping, community activity, and yard  work. The patient will benefit from PT to address the limitations/impairments listed below to return to their prior level of function in the domains of activity and participation.    PERSONAL FACTORS:  n/a  are also affecting patient's functional outcome.   REHAB POTENTIAL: Good  CLINICAL DECISION MAKING: Stable/uncomplicated  EVALUATION COMPLEXITY: Low  --------------------------------------------------------------------------------------------- GOALS: Goals reviewed with patient?  No  SHORT TERM GOALS: Target date: 07/14/2023    Patient will score a >/= 40/80 on the LEFS    to demonstrate a Minimally Clinically Importance Difference (MCID) in ADL completion, home/community ambulation, and lifting/bending/squatting.  Baseline: Goal status: INITIAL   2. Patient will be independent with a basic stretching/strengthening HEP  Baseline:  Goal status: INITIAL   LONG TERM GOALS: Target date: 08/04/2023    Patient will score a >/= 48/80 on the LEFS    to demonstrate a Minimally Clinically Importance Difference (MCID) in ADL completion, home/community ambulation, and lifting/bending/squatting. Baseline:  Goal status: INITIAL  2.  Patient will complete >/= 245 feet on the  2 minute walk test:    to demonstrate an improvement in ADL completion, stair negotiation, household/community ambulation, and self-care Baseline:  Goal status: INITIAL  3.  Patient will be independent with a comprehensive strengthening HEP  Baseline:  Goal status: INITIAL  4. Patient will be able to demonstrate left knee flexion active range of motion to 0-120 degrees to facilitate ADL completion, lifting, squatting. Baseline:  Goal status: INITIAL  --------------------------------------------------------------------------------------------- PLAN:  PT FREQUENCY: 1-2x/week  PT DURATION: 6 weeks  PLANNED INTERVENTIONS: 97110-Therapeutic exercises, 97530- Therapeutic activity, 97112- Neuromuscular re-education, 97535- Self Care, 96295- Manual therapy, [phone]- Gait training, Patient/Family education, Balance training, Stair training, Dry Needling, Joint mobilization, Joint manipulation, Spinal manipulation, Spinal mobilization, Cryotherapy, and Moist heat.  PLAN FOR NEXT SESSION: Continue with progression of lower extremity strength exercises    Seymour Bars, PT 07/05/2023, 9:37 AM

## 2023-07-06 ENCOUNTER — Encounter: Payer: Self-pay | Admitting: Internal Medicine

## 2023-07-07 ENCOUNTER — Telehealth: Payer: Self-pay

## 2023-07-07 MED ORDER — NOVOLOG FLEXPEN 100 UNIT/ML ~~LOC~~ SOPN: PEN_INJECTOR | SUBCUTANEOUS | 0 refills | Status: DC

## 2023-07-07 NOTE — Telephone Encounter (Signed)
Patient would like to have a insulin pen until she is able to get her pump supplies. Order was sent to [employer] so she will reach out to them to see if they can get order sent.

## 2023-07-10 ENCOUNTER — Other Ambulatory Visit: Payer: Self-pay

## 2023-07-10 MED ORDER — PEN NEEDLES 32G X 4 MM MISC: 2 refills | Status: DC

## 2023-07-11 ENCOUNTER — Encounter (HOSPITAL_COMMUNITY): Payer: BC Managed Care – PPO

## 2023-07-18 ENCOUNTER — Encounter (HOSPITAL_COMMUNITY): Payer: BC Managed Care – PPO

## 2023-07-28 ENCOUNTER — Telehealth: Payer: Self-pay

## 2023-07-28 ENCOUNTER — Other Ambulatory Visit: Payer: Self-pay

## 2023-07-28 NOTE — Telephone Encounter (Signed)
Patient picked up samples of [employer] XC 3 sets

## 2023-07-28 NOTE — Telephone Encounter (Signed)
Patient will stop by and pick up 3 of the [employer] XC infusion sets. They will be placed up front

## 2023-08-01 ENCOUNTER — Encounter (HOSPITAL_COMMUNITY): Payer: Self-pay

## 2023-08-01 ENCOUNTER — Ambulatory Visit (HOSPITAL_COMMUNITY): Payer: BC Managed Care – PPO | Attending: Orthopedic Surgery

## 2023-08-01 DIAGNOSIS — M25562 Pain in left knee: Secondary | ICD-10-CM | POA: Insufficient documentation

## 2023-08-01 DIAGNOSIS — G8929 Other chronic pain: Secondary | ICD-10-CM | POA: Diagnosis present

## 2023-08-01 DIAGNOSIS — M6281 Muscle weakness (generalized): Secondary | ICD-10-CM | POA: Insufficient documentation

## 2023-08-01 DIAGNOSIS — R262 Difficulty in walking, not elsewhere classified: Secondary | ICD-10-CM | POA: Diagnosis present

## 2023-08-01 NOTE — Therapy (Unsigned)
OUTPATIENT PHYSICAL THERAPY TREATMENT   Patient Name: Sarah [name] MRN: [id number] DOB:04-04-1961, 62 y.o., female Today's Date: 08/01/2023  END OF SESSION:   PT End of Session - 08/01/23 1023     Visit Number 5    Authorization Type BCBS    Authorization Time Period No auth    Progress Note Due on Visit 10    PT Start Time 1015    PT Stop Time 1055    PT Time Calculation (min) 40 min    Activity Tolerance Patient tolerated treatment well    Behavior During Therapy WFL for tasks assessed/performed              Past Medical History:  Diagnosis Date   Anxiety    Diabetes mellitus    type II-pt uses insulin pump   Diverticulosis    GERD (gastroesophageal reflux disease)    Hypercholesteremia    Hypertension    Insulin pump in place    DM type II   Past Surgical History:  Procedure Laterality Date   ABDOMINAL HYSTERECTOMY     complete   CARDIAC CATHETERIZATION  2011   COLONOSCOPY  01/04/2010   left sided transverse diverticula/two diminutive rectal polyps   COLONOSCOPY  03/28/2012   Surgeon: Corbin Ade, MD; riable anal canal with suspected trivial rectal bleeding from this location, colonic diverticulosis, s/p segmental resection, single diminutive polyp in the descending colon resected.  Pathology was benign.  Recommended 10-year repeat.   COLONOSCOPY N/A 09/11/2018   Surgeon: Franky Macho, MD;   moderate diverticulosis in the descending colon, otherwise normal exam.  Recommended 10-year repeat.   FOOT SURGERY     right-tendon repair   laparoscopic sigmoid colectomy  02/01/2010   Dr Jenkins-diverticulitis   TRIGGER FINGER RELEASE Left 03/2015   TRIGGER FINGER RELEASE Right 02/25/2016   Procedure: RIGHT LONG TRIGGER FINGER RELEASE;  Surgeon: Vickki Hearing, MD;  Location: [facility];  Service: Orthopedics;  Laterality: Right;   UMBILICAL HERNIA REPAIR N/A 02/05/2020   Procedure: UMBILICAL HERNIORRHAPY WITH MESH;  Surgeon: Franky Macho, MD;  Location: [facility];  Service: General;  Laterality: N/A;   Patient Active Problem List   Diagnosis Date Noted   Urinary frequency 10/14/2022   Type 2 diabetes mellitus with diabetic polyneuropathy, with long-term current use of insulin (HCC) 06/07/2022   Type 2 diabetes mellitus with hyperglycemia, with long-term current use of insulin (HCC) 06/07/2022   Acute pyelonephritis 03/05/2022   Hypoalbuminemia due to protein-calorie malnutrition (HCC) 03/05/2022   Lower abdominal pain 12/10/2021   Hernia of abdominal cavity 01/03/2021   Elevated LFTs 01/03/2021   History of severe acute respiratory syndrome coronavirus 2 (SARS-CoV-2) disease 01/03/2021   Hardening of the aorta (main artery of the heart) (HCC) 01/03/2021   Neuropathy 01/03/2021   Obesity 01/03/2021   Ventricular tachycardia (HCC) 01/03/2021   Umbilical hernia without obstruction and without gangrene    Special screening for malignant neoplasms, colon    Diverticulosis of large intestine without diverticulitis    History of palpitations in adulthood 07/02/2018   Murmur, cardiac 07/02/2018   History of chest pain 06/27/2018   Essential hypertension 06/27/2018   Trigger middle finger    Type 2 diabetes mellitus with obesity (HCC) 08/19/2015   Herpes simplex 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 08/19/2015   Visit for routine gyn exam 08/19/2015   Diabetes mellitus with hyperglycemia (HCC) 06/24/2013   Rectal bleed 03/23/2012   Mixed hyperlipidemia 11/30/2009   Anxiety state 11/30/2009  Constipation 11/30/2009    PCP: Benita Stabile, MDPCP - General   REFERRING PROVIDER: Vickki Hearing, MDRef Provider   REFERRING DIAG: [phone] (ICD-10-CM) - Chronic pain of left knee   Rationale for Evaluation and Treatment: Rehabilitation  THERAPY DIAG:  Chronic pain of left knee  Difficulty in walking, not elsewhere classified  Muscle weakness (generalized)  ONSET DATE: ~1  month --------------------------------------------------------------------------------------------- SUBJECTIVE:                                                                                                                                                                                           SUBJECTIVE STATEMENT:  Patient states she is awaiting insurance authorization for her diabetes medicine. Patient has had elevated blood sugar and increased cramping in lower extremity. Saw her MD and is awaiting next step to manages cramping from her MD.    IE: About 1 month ago, patient states left knee pain increased; no MOI specified. Patient went to MD 10/24 who performed x-ray; MD found + OA in the right knee. No cortisone shot performed. Patient referred to OPPT.   PERTINENT HISTORY:  DM II  Chronic left knee pain Knee    PAIN:  Are you having pain? Yes: NPRS scale: 4-5/10 Pain location: lateral left knee Pain description: throbbing Aggravating factors: walking Relieving factors: rest  PRECAUTIONS: None  RED FLAGS: None   WEIGHT BEARING RESTRICTIONS: No  FALLS:  Has patient fallen in last 6 months? No  OCCUPATION: retired Runner, broadcasting/film/video   PLOF: Independent  PATIENT GOALS: To walk pain-free   NEXT MD VISIT: Jan 23 --------------------------------------------------------------------------------------------- OBJECTIVE:   DIAGNOSTIC FINDINGS:  Narrative & Impression  Left knee pain    3 views left knee   Normal alignment   Osteophyte seen medially small   Patellofemoral alignment normal with what appears to be narrowing of the lateral facet and the lateral trochlea   Mild grade 2 disease definitive with possibility of joint space narrowing    PATIENT SURVEYS: Lower Extremity Functional Scale (LEFS): 32/80   SCREENING FOR RED FLAGS: Bowel or bladder incontinence: No Spinal tumors: No Cauda equina syndrome: No Compression fracture: No Abdominal aneurysm:  No  COGNITION: Overall cognitive status: Within functional limits for tasks assessed  POSTURE: No Significant postural limitations      FUNCTIONAL TESTS:  2 minute walk test: 215 feet   GAIT ANALYSIS: Distance walked: 215  Assistive device utilized: Single point cane Level of assistance: SBA Comments: Patient with moderate antalgia on left lower extremity; moderate weight shift to the right side; patient with decreased knee flexion through stance   SENSATION: [facility]    LOWER  EXTREMITY MMT:    MMT Right eval Left eval  Hip flexion 3+/5 3+/5  Hip extension    Hip abduction    Hip adduction    Hip internal rotation    Hip external rotation    Knee flexion 4-/5 3/5  Knee extension 4-/5 3/5  Ankle dorsiflexion 4-/5   Ankle plantarflexion    Ankle inversion    Ankle eversion     (Blank rows = not tested)  LOWER EXTREMITY ROM:     Active  Right eval Left eval  Hip flexion    Hip extension    Hip abduction    Hip adduction    Hip internal rotation    Hip external rotation    Knee flexion  0-110 pain  Knee extension  (3)  Ankle dorsiflexion    Ankle plantarflexion    Ankle inversion    Ankle eversion     (Blank rows = not tested)  LOWER EXTREMITY SPECIAL TESTS + Varus stress test for pain, left LCL      PALPATION: Moderate tenderness to palpation left LCL   --------------------------------------------------------------------------------------------- TODAY'S TREATMENT:                                                                                                                              DATE:   08/01/23 Prone  Manual therapy to left lower extremity: soft tissue mobilization to left calf, hamstring, manual stretching for pain management, muscle spasms  Education on proper hydration for cramping management Seated    07/05/23 MATRIX bike warm up L2 seat 10 Seated  Leg press 50# 2x10  Leg curl with 50# 2x10 Supine  SAQs with 5# 3x10  Hip  bridges 2x10  DKTC with G physioball 2x10   Bridges with G physioball 2x10  06/30/23 MAT[employer]X bike warm up L2 Supine  DKTC with Green physioball   SAQs with 3# Seated  Leg press 40# 2x10  Leg curl with 40# 2x10   06/28/23 MATRIX bike warm up L2 Supine   Manual therapy to left knee: PROM, gentle soft tissue mobilization, mobilization with movement into knee flexion   AAROM left knee flexion + strap  Quad sets  SAQ with 2#   PATIENT EDUCATION:  Education details: assistive device use, pain management, activity modification  Person educated: Patient Education method: Explanation Education comprehension: verbalized understanding  HOME EXERCISE PROGRAM: TBD --------------------------------------------------------------------------------------------- ASSESSMENT:  CLINICAL IMPRESSION: Today: PT progressed with lower extremity strengthening today. Patient able to tolerate added weight by 10# to all lower extremity machines; tolerated will with no pain. Requires minimal verbal cueing for exercise progression    Patient will continue to benefit from PT to return to prior level of function     IE: Patient is a 62 y.o. y.o. female who was seen today for physical therapy evaluation and treatment for left knee pain, difficulty walking . Patient presents to PT with the following objective impairments: Abnormal gait, decreased activity tolerance,  decreased endurance, decreased mobility, difficulty walking, decreased strength, improper body mechanics, and pain. These impairments limit the patient in activities such as carrying, lifting, bending, standing, squatting, stairs, transfers, and locomotion level. These impairments also limit the patient in participation such as meal prep, cleaning, laundry, shopping, community activity, and yard work. The patient will benefit from PT to address the limitations/impairments listed below to return to their prior level of function in the domains of activity  and participation.    PERSONAL FACTORS:  n/a  are also affecting patient's functional outcome.   REHAB POTENTIAL: Good  CLINICAL DECISION MAKING: Stable/uncomplicated  EVALUATION COMPLEXITY: Low  --------------------------------------------------------------------------------------------- GOALS: Goals reviewed with patient? No  SHORT TERM GOALS: Target date: 07/14/2023    Patient will score a >/= 40/80 on the LEFS    to demonstrate a Minimally Clinically Importance Difference (MCID) in ADL completion, home/community ambulation, and lifting/bending/squatting.  Baseline: Goal status: INITIAL   2. Patient will be independent with a basic stretching/strengthening HEP  Baseline:  Goal status: INITIAL   LONG TERM GOALS: Target date: 08/04/2023    Patient will score a >/= 48/80 on the LEFS    to demonstrate a Minimally Clinically Importance Difference (MCID) in ADL completion, home/community ambulation, and lifting/bending/squatting. Baseline:  Goal status: INITIAL  2.  Patient will complete >/= 245 feet on the  2 minute walk test:    to demonstrate an improvement in ADL completion, stair negotiation, household/community ambulation, and self-care Baseline:  Goal status: INITIAL  3.  Patient will be independent with a comprehensive strengthening HEP  Baseline:  Goal status: INITIAL  4. Patient will be able to demonstrate left knee flexion active range of motion to 0-120 degrees to facilitate ADL completion, lifting, squatting. Baseline:  Goal status: INITIAL  --------------------------------------------------------------------------------------------- PLAN:  PT FREQUENCY: 1-2x/week  PT DURATION: 6 weeks  PLANNED INTERVENTIONS: 97110-Therapeutic exercises, 97530- Therapeutic activity, 97112- Neuromuscular re-education, 97535- Self Care, [id number]- Manual therapy, [phone]- Gait training, Patient/Family education, Balance training, Stair training, Dry Needling, Joint  mobilization, Joint manipulation, Spinal manipulation, Spinal mobilization, Cryotherapy, and Moist heat.  PLAN FOR NEXT SESSION: Continue with progression of lower extremity strength exercises    Seymour Bars, PT 08/01/2023, 2:25 PM

## 2023-08-03 ENCOUNTER — Other Ambulatory Visit (HOSPITAL_COMMUNITY): Payer: Self-pay | Admitting: Nurse Practitioner

## 2023-08-03 DIAGNOSIS — R252 Cramp and spasm: Secondary | ICD-10-CM

## 2023-08-04 ENCOUNTER — Telehealth: Payer: Self-pay

## 2023-08-04 NOTE — Telephone Encounter (Signed)
Pharmacy Patient Advocate Encounter  Received notification from [facility] that Prior Authorization for TSLIM insulin pump has been DENIED.  Full denial letter will be uploaded to the media tab. See denial reason below.

## 2023-08-09 NOTE — Telephone Encounter (Signed)
Order placed through parachute

## 2023-08-10 ENCOUNTER — Ambulatory Visit (HOSPITAL_COMMUNITY): Payer: BC Managed Care – PPO

## 2023-08-10 DIAGNOSIS — M25562 Pain in left knee: Secondary | ICD-10-CM | POA: Diagnosis not present

## 2023-08-10 DIAGNOSIS — G8929 Other chronic pain: Secondary | ICD-10-CM

## 2023-08-10 DIAGNOSIS — M6281 Muscle weakness (generalized): Secondary | ICD-10-CM

## 2023-08-10 DIAGNOSIS — R262 Difficulty in walking, not elsewhere classified: Secondary | ICD-10-CM

## 2023-08-10 NOTE — Therapy (Addendum)
OUTPATIENT PHYSICAL THERAPY TREATMENT   Patient Name: Sarah [name] MRN: [id number] DOB:Jun 21, 1961, 62 y.o., female Today's Date: 08/10/2023  END OF SESSION:   PT End of Session - 08/10/23 0933     Visit Number 6    Authorization Type [employer]    Authorization Time Period No auth    Progress Note Due on Visit 10    PT Start Time 0930    PT Stop Time 1010    PT Time Calculation (min) 40 min    Activity Tolerance Patient tolerated treatment well    Behavior During Therapy WFL for tasks assessed/performed              Past Medical History:  Diagnosis Date   Anxiety    Diabetes mellitus    type II-pt uses insulin pump   Diverticulosis    GERD (gastroesophageal reflux disease)    Hypercholesteremia    Hypertension    Insulin pump in place    DM type II   Past Surgical History:  Procedure Laterality Date   ABDOMINAL HYSTERECTOMY     complete   CARDIAC CATHETERIZATION  2011   COLONOSCOPY  01/04/2010   left sided transverse diverticula/two diminutive rectal polyps   COLONOSCOPY  03/28/2012   Surgeon: Corbin Ade, MD; riable anal canal with suspected trivial rectal bleeding from this location, colonic diverticulosis, s/p segmental resection, single diminutive polyp in the descending colon resected.  Pathology was benign.  Recommended 10-year repeat.   COLONOSCOPY N/A 09/11/2018   Surgeon: Franky Macho, MD;   moderate diverticulosis in the descending colon, otherwise normal exam.  Recommended 10-year repeat.   FOOT SURGERY     right-tendon repair   laparoscopic sigmoid colectomy  02/01/2010   Dr Jenkins-diverticulitis   TRIGGER FINGER RELEASE Left 03/2015   TRIGGER FINGER RELEASE Right 02/25/2016   Procedure: RIGHT LONG TRIGGER FINGER RELEASE;  Surgeon: Vickki Hearing, MD;  Location: [facility];  Service: Orthopedics;  Laterality: Right;   UMBILICAL HERNIA REPAIR N/A 02/05/2020   Procedure: UMBILICAL HERNIORRHAPY WITH MESH;  Surgeon: Franky Macho, MD;  Location: [facility];  Service: General;  Laterality: N/A;   Patient Active Problem List   Diagnosis Date Noted   Urinary frequency 10/14/2022   Type 2 diabetes mellitus with diabetic polyneuropathy, with long-term current use of insulin (HCC) 06/07/2022   Type 2 diabetes mellitus with hyperglycemia, with long-term current use of insulin (HCC) 06/07/2022   Acute pyelonephritis 03/05/2022   Hypoalbuminemia due to protein-calorie malnutrition (HCC) 03/05/2022   Lower abdominal pain 12/10/2021   Hernia of abdominal cavity 01/03/2021   Elevated LFTs 01/03/2021   History of severe acute respiratory syndrome coronavirus 2 (SARS-CoV-2) disease 01/03/2021   Hardening of the aorta (main artery of the heart) (HCC) 01/03/2021   Neuropathy 01/03/2021   Obesity 01/03/2021   Ventricular tachycardia (HCC) 01/03/2021   Umbilical hernia without obstruction and without gangrene    Special screening for malignant neoplasms, colon    Diverticulosis of large intestine without diverticulitis    History of palpitations in adulthood 07/02/2018   Murmur, cardiac 07/02/2018   History of chest pain 06/27/2018   Essential hypertension 06/27/2018   Trigger middle finger    Type 2 diabetes mellitus with obesity (HCC) 08/19/2015   Herpes simplex 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 08/19/2015   Visit for routine gyn exam 08/19/2015   Diabetes mellitus with hyperglycemia (HCC) 06/24/2013   Rectal bleed 03/23/2012   Mixed hyperlipidemia 11/30/2009   Anxiety state 11/30/2009  Constipation 11/30/2009    PCP: Benita Stabile, MDPCP - General   REFERRING PROVIDER: Vickki Hearing, MDRef Provider   REFERRING DIAG: [phone] (ICD-10-CM) - Chronic pain of left knee   Rationale for Evaluation and Treatment: Rehabilitation  THERAPY DIAG:  Chronic pain of left knee  Difficulty in walking, not elsewhere classified  Muscle weakness (generalized)  ONSET DATE: ~1  month --------------------------------------------------------------------------------------------- SUBJECTIVE:                                                                                                                                                                                           SUBJECTIVE STATEMENT:  Patient states her cramping has improved with proper electrolytes; 5/10 left knee pain  .    IE: About 1 month ago, patient states left knee pain increased; no MOI specified. Patient went to MD 10/24 who performed x-ray; MD found + OA in the right knee. No cortisone shot performed. Patient referred to OPPT.   PERTINENT HISTORY:  DM II  Chronic left knee pain Knee    PAIN:  Are you having pain? Yes: NPRS scale: 4-5/10 Pain location: lateral left knee Pain description: throbbing Aggravating factors: walking Relieving factors: rest  PRECAUTIONS: None  RED FLAGS: None   WEIGHT BEARING RESTRICTIONS: No  FALLS:  Has patient fallen in last 6 months? No  OCCUPATION: retired Runner, broadcasting/film/video   PLOF: Independent  PATIENT GOALS: To walk pain-free   NEXT MD VISIT: Jan 23 --------------------------------------------------------------------------------------------- OBJECTIVE:   DIAGNOSTIC FINDINGS:  Narrative & Impression  Left knee pain    3 views left knee   Normal alignment   Osteophyte seen medially small   Patellofemoral alignment normal with what appears to be narrowing of the lateral facet and the lateral trochlea   Mild grade 2 disease definitive with possibility of joint space narrowing    PATIENT SURVEYS: Lower Extremity Functional Scale (LEFS): 32/80   SCREENING FOR RED FLAGS: Bowel or bladder incontinence: No Spinal tumors: No Cauda equina syndrome: No Compression fracture: No Abdominal aneurysm: No  COGNITION: Overall cognitive status: Within functional limits for tasks assessed  POSTURE: No Significant postural limitations      FUNCTIONAL  TESTS:  2 minute walk test: 215 feet   GAIT ANALYSIS: Distance walked: 215  Assistive device utilized: Single point cane Level of assistance: SBA Comments: Patient with moderate antalgia on left lower extremity; moderate weight shift to the right side; patient with decreased knee flexion through stance   SENSATION: WFL    LOWER EXTREMITY MMT:    MMT Right eval Left eval  Hip flexion 3+/5 3+/5  Hip extension    Hip  abduction    Hip adduction    Hip internal rotation    Hip external rotation    Knee flexion 4-/5 3/5  Knee extension 4-/5 3/5  Ankle dorsiflexion 4-/5   Ankle plantarflexion    Ankle inversion    Ankle eversion     (Blank rows = not tested)  LOWER EXTREMITY ROM:     Active  Right eval Left eval  Hip flexion    Hip extension    Hip abduction    Hip adduction    Hip internal rotation    Hip external rotation    Knee flexion  0-110 pain  Knee extension  (3)  Ankle dorsiflexion    Ankle plantarflexion    Ankle inversion    Ankle eversion     (Blank rows = not tested)  LOWER EXTREMITY SPECIAL TESTS + Varus stress test for pain, left LCL      PALPATION: Moderate tenderness to palpation left LCL   --------------------------------------------------------------------------------------------- TODAY'S TREATMENT:                                                                                                                              DATE:   08/10/23 Seated Bike Warm Up L2 Supine  Left SAQs with 2# 2x10  Left partial SLRs with 1/2# 2x10 Prone  Left knee flexion with 3# 2x10  Left hip extension with knee bent with 3# 2x10  Manual therapy to left hip; gentle hip flexor stretch, instrument-assisted soft tissue mobilization to left calf/ HS Standing  Heel raises on slant board 2x10   08/01/23 Prone  Manual therapy to left lower extremity: soft tissue mobilization to left calf, hamstring, manual stretching for pain management, muscle  spasms  Education on proper hydration for cramping management Seated  NuStep L3   07/05/23 MATRIX bike warm up L2 seat 10 Seated  Leg press 50# 2x10  Leg curl with 50# 2x10 Supine  SAQs with 5# 3x10  Hip bridges 2x10  DKTC with G physioball 2x10   Bridges with G physioball 2x10  06/30/23 MATRIX bike warm up L2 Supine  DKTC with Green physioball   SAQs with 3# Seated  Leg press 40# 2x10  Leg curl with 40# 2x10   06/28/23 MATRIX bike warm up L2 Supine   Manual therapy to left knee: PROM, gentle soft tissue mobilization, mobilization with movement into knee flexion   AAROM left knee flexion + strap  Quad sets  SAQ with 2#   PATIENT EDUCATION:  Education details: assistive device use, pain management, activity modification  Person educated: Patient Education method: Explanation Education comprehension: verbalized understanding  HOME EXERCISE PROGRAM: TBD --------------------------------------------------------------------------------------------- ASSESSMENT:  CLINICAL IMPRESSION: PT progressed patient with lower extremity strengthening therapeutic exercise. Patient able to tolerate exercises well with no increase in pain. Patient able to achieve (1) knee extension active range of motion  Pt tolerated will with no pain.    Patient will continue to benefit from  PT to return to prior level of function     IE: Patient is a 62 y.o. y.o. female who was seen today for physical therapy evaluation and treatment for left knee pain, difficulty walking . Patient presents to PT with the following objective impairments: Abnormal gait, decreased activity tolerance, decreased endurance, decreased mobility, difficulty walking, decreased strength, improper body mechanics, and pain. These impairments limit the patient in activities such as carrying, lifting, bending, standing, squatting, stairs, transfers, and locomotion level. These impairments also limit the patient in participation  such as meal prep, cleaning, laundry, shopping, community activity, and yard work. The patient will benefit from PT to address the limitations/impairments listed below to return to their prior level of function in the domains of activity and participation.    PERSONAL FACTORS:  n/a  are also affecting patient's functional outcome.   REHAB POTENTIAL: Good  CLINICAL DECISION MAKING: Stable/uncomplicated  EVALUATION COMPLEXITY: Low  --------------------------------------------------------------------------------------------- GOALS: Goals reviewed with patient? No  SHORT TERM GOALS: Target date: 07/14/2023    Patient will score a >/= 40/80 on the LEFS    to demonstrate a Minimally Clinically Importance Difference (MCID) in ADL completion, home/community ambulation, and lifting/bending/squatting.  Baseline: Goal status: INITIAL   2. Patient will be independent with a basic stretching/strengthening HEP  Baseline:  Goal status: INITIAL   LONG TERM GOALS: Target date: 08/04/2023    Patient will score a >/= 48/80 on the LEFS    to demonstrate a Minimally Clinically Importance Difference (MCID) in ADL completion, home/community ambulation, and lifting/bending/squatting. Baseline:  Goal status: INITIAL  2.  Patient will complete >/= 245 feet on the  2 minute walk test:    to demonstrate an improvement in ADL completion, stair negotiation, household/community ambulation, and self-care Baseline:  Goal status: INITIAL  3.  Patient will be independent with a comprehensive strengthening HEP  Baseline:  Goal status: INITIAL  4. Patient will be able to demonstrate left knee flexion active range of motion to 0-120 degrees to facilitate ADL completion, lifting, squatting. Baseline:  Goal status: INITIAL  --------------------------------------------------------------------------------------------- PLAN:  PT FREQUENCY: 1-2x/week  PT DURATION: 6 weeks  PLANNED INTERVENTIONS:  97110-Therapeutic exercises, 97530- Therapeutic activity, 97112- Neuromuscular re-education, 97535- Self Care, [id number]- Manual therapy, [phone]- Gait training, Patient/Family education, Balance training, Stair training, Dry Needling, Joint mobilization, Joint manipulation, Spinal manipulation, Spinal mobilization, Cryotherapy, and Moist heat.  PLAN FOR NEXT SESSION: Continue with progression of lower extremity strength exercises    Seymour Bars, PT 08/10/2023, 9:34 AM

## 2023-08-17 ENCOUNTER — Ambulatory Visit (HOSPITAL_COMMUNITY): Payer: BC Managed Care – PPO

## 2023-08-17 DIAGNOSIS — R262 Difficulty in walking, not elsewhere classified: Secondary | ICD-10-CM

## 2023-08-17 DIAGNOSIS — G8929 Other chronic pain: Secondary | ICD-10-CM

## 2023-08-17 DIAGNOSIS — M25562 Pain in left knee: Secondary | ICD-10-CM | POA: Diagnosis not present

## 2023-08-17 DIAGNOSIS — M6281 Muscle weakness (generalized): Secondary | ICD-10-CM

## 2023-08-17 NOTE — Therapy (Signed)
OUTPATIENT PHYSICAL THERAPY TREATMENT   Patient Name: Sarah [name] MRN: [id number] DOB:[birth year], 62 y.o., female Today's Date: 08/17/2023  END OF SESSION:   PT End of Session - 08/17/23 1121     Visit Number 7    Authorization Type BC[employer]    Authorization Time Period No auth    Progress Note Due on Visit 10    PT Start Time 1105    PT Stop Time 1145    PT Time Calculation (min) 40 min    Activity Tolerance Patient tolerated treatment well    Behavior During Therapy WFL for tasks assessed/performed             Past Medical History:  Diagnosis Date   Anxiety    Diabetes mellitus    type II-pt uses insulin pump   Diverticulosis    GERD (gastroesophageal reflux disease)    Hypercholesteremia    Hypertension    Insulin pump in place    DM type II   Past Surgical History:  Procedure Laterality Date   ABDOMINAL HYSTERECTOMY     complete   CARDIAC CATHETERIZATION  2011   COLONOSCOPY  01/04/2010   left sided transverse diverticula/two diminutive rectal polyps   COLONOSCOPY  03/28/2012   Surgeon: Corbin Ade, MD; riable anal canal with suspected trivial rectal bleeding from this location, colonic diverticulosis, s/p segmental resection, single diminutive polyp in the descending colon resected.  Pathology was benign.  Recommended 10-year repeat.   COLONOSCOPY N/A 09/11/2018   Surgeon: Franky Macho, MD;   moderate diverticulosis in the descending colon, otherwise normal exam.  Recommended 10-year repeat.   FOOT SURGERY     right-tendon repair   laparoscopic sigmoid colectomy  02/01/2010   Dr Jenkins-diverticulitis   TRIGGER FINGER RELEASE Left 03/2015   TRIGGER FINGER RELEASE Right 02/25/2016   Procedure: RIGHT LONG TRIGGER FINGER RELEASE;  Surgeon: Vickki Hearing, MD;  Location: [facility];  Service: Orthopedics;  Laterality: Right;   UMBILICAL HERNIA REPAIR N/A 02/05/2020   Procedure: UMBILICAL HERNIORRHAPY WITH MESH;  Surgeon: Franky Macho, MD;  Location: [facility];  Service: General;  Laterality: N/A;   Patient Active Problem List   Diagnosis Date Noted   Urinary frequency 10/14/2022   Type 2 diabetes mellitus with diabetic polyneuropathy, with long-term current use of insulin (HCC) 06/07/2022   Type 2 diabetes mellitus with hyperglycemia, with long-term current use of insulin (HCC) 06/07/2022   Acute pyelonephritis 03/05/2022   Hypoalbuminemia due to protein-calorie malnutrition (HCC) 03/05/2022   Lower abdominal pain 12/10/2021   Hernia of abdominal cavity 01/03/2021   Elevated LFTs 01/03/2021   History of severe acute respiratory syndrome coronavirus 2 (SARS-CoV-2) disease 01/03/2021   Hardening of the aorta (main artery of the heart) (HCC) 01/03/2021   Neuropathy 01/03/2021   Obesity 01/03/2021   Ventricular tachycardia (HCC) 01/03/2021   Umbilical hernia without obstruction and without gangrene    Special screening for malignant neoplasms, colon    Diverticulosis of large intestine without diverticulitis    History of palpitations in adulthood 07/02/2018   Murmur, cardiac 07/02/2018   History of chest pain 06/27/2018   Essential hypertension 06/27/2018   Trigger middle finger    Type 2 diabetes mellitus with obesity (HCC) 08/19/2015   Herpes simplex 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 08/19/2015   Visit for routine gyn exam 08/19/2015   Diabetes mellitus with hyperglycemia (HCC) 06/24/2013   Rectal bleed 03/23/2012   Mixed hyperlipidemia 11/30/2009   Anxiety state 11/30/2009   Constipation  11/30/2009    PCP: Benita Stabile, MDPCP - General   REFERRING PROVIDER: Vickki Hearing, MDRef Provider   REFERRING DIAG: [phone] (ICD-10-CM) - Chronic pain of left knee   Rationale for Evaluation and Treatment: Rehabilitation  THERAPY DIAG:  Chronic pain of left knee  Difficulty in walking, not elsewhere classified  Muscle weakness (generalized)  ONSET DATE: ~1  month --------------------------------------------------------------------------------------------- SUBJECTIVE:                                                                                                                                                                                           SUBJECTIVE STATEMENT:  Patient reports that her knee is sore and attributes the soreness to the cold weather.   IE: About 1 month ago, patient states left knee pain increased; no MOI specified. Patient went to MD 10/24 who performed x-ray; MD found + OA in the right knee. No cortisone shot performed. Patient referred to OPPT.   PERTINENT HISTORY:  DM II  Chronic left knee pain Knee    PAIN:  Are you having pain? Yes: NPRS scale: 4-5/10 Pain location: lateral left knee Pain description: throbbing Aggravating factors: walking Relieving factors: rest  PRECAUTIONS: None  RED FLAGS: None   WEIGHT BEARING RESTRICTIONS: No  FALLS:  Has patient fallen in last 6 months? No  OCCUPATION: retired Runner, broadcasting/film/video   PLOF: Independent  PATIENT GOALS: To walk pain-free   NEXT MD VISIT: Jan 23 --------------------------------------------------------------------------------------------- OBJECTIVE:   DIAGNOSTIC FINDINGS:  Narrative & Impression  Left knee pain    3 views left knee   Normal alignment   Osteophyte seen medially small   Patellofemoral alignment normal with what appears to be narrowing of the lateral facet and the lateral trochlea   Mild grade 2 disease definitive with possibility of joint space narrowing    PATIENT SURVEYS: Lower Extremity Functional Scale (LEFS): 32/80   SCREENING FOR RED FLAGS: Bowel or bladder incontinence: No Spinal tumors: No Cauda equina syndrome: No Compression fracture: No Abdominal aneurysm: No  COGNITION: Overall cognitive status: Within functional limits for tasks assessed  POSTURE: No Significant postural limitations      FUNCTIONAL  TESTS:  2 minute walk test: 215 feet   GAIT ANALYSIS: Distance walked: 215  Assistive device utilized: Single point cane Level of assistance: SBA Comments: Patient with moderate antalgia on left lower extremity; moderate weight shift to the right side; patient with decreased knee flexion through stance   SENSATION: WFL    LOWER EXTREMITY MMT:    MMT Right eval Left eval  Hip flexion 3+/5 3+/5  Hip extension    Hip abduction  Hip adduction    Hip internal rotation    Hip external rotation    Knee flexion 4-/5 3/5  Knee extension 4-/5 3/5  Ankle dorsiflexion 4-/5   Ankle plantarflexion    Ankle inversion    Ankle eversion     (Blank rows = not tested)  LOWER EXTREMITY ROM:     Active  Right eval Left eval  Hip flexion    Hip extension    Hip abduction    Hip adduction    Hip internal rotation    Hip external rotation    Knee flexion  0-110 pain  Knee extension  (3)  Ankle dorsiflexion    Ankle plantarflexion    Ankle inversion    Ankle eversion     (Blank rows = not tested)  LOWER EXTREMITY SPECIAL TESTS + Varus stress test for pain, left LCL      PALPATION: Moderate tenderness to palpation left LCL   --------------------------------------------------------------------------------------------- TODAY'S TREATMENT:                                                                                                                              DATE:  08/17/23 NuStep, level 1, seat 8, 5' Seated L hamstring stretch x 30" x 3 Supine L hip flexor stretch with LE dangling on the side of the bed x 30" Seated L hip flexor stretch x 30" x 2 Bridging x 3" x 10 x 2 Sidelying L clamshells, RTB x 10 x 2 L SLR x 10 x 2 x 2 lbs L runner's stretch x 3" x 3  08/10/23 Seated Bike Warm Up L2 Supine  Left SAQs with 2# 2x10  Left partial SLRs with 1/2# 2x10 Prone  Left knee flexion with 3# 2x10  Left hip extension with knee bent with 3# 2x10  Manual therapy to  left hip; gentle hip flexor stretch, instrument-assisted soft tissue mobilization to left calf/ HS Standing  Heel raises on slant board 2x10   08/01/23 Prone  Manual therapy to left lower extremity: soft tissue mobilization to left calf, hamstring, manual stretching for pain management, muscle spasms  Education on proper hydration for cramping management Seated  NuStep L3   07/05/23 MATRIX bike warm up L2 seat 10 Seated  Leg press 50# 2x10  Leg curl with 50# 2x10 Supine  SAQs with 5# 3x10  Hip bridges 2x10  DKTC with G physioball 2x10   Bridges with G physioball 2x10  06/30/23 MATRIX bike warm up L2 Supine  DKTC with Green physioball   SAQs with 3# Seated  Leg press 40# 2x10  Leg curl with 40# 2x10   06/28/23 MATRIX bike warm up L2 Supine   Manual therapy to left knee: PROM, gentle soft tissue mobilization, mobilization with movement into knee flexion   AAROM left knee flexion + strap  Quad sets  SAQ with 2#   PATIENT EDUCATION:  Education details: assistive device use, pain management, activity modification  Person educated: Patient Education method: Explanation Education comprehension: verbalized understanding  HOME EXERCISE PROGRAM: Access Code: [email or web address] URL: [email or web address] Date: 08/17/2023 Prepared by: Krystal Clark  Exercises - Seated Hamstring Stretch  - 1 x daily - 5-7 x weekly - 3 reps - 30 hold - Seated Hip Flexor Stretch  - 1 x daily - 5-7 x weekly - 3 reps - 30 hold - Supine Bridge  - 1 x daily - 5-7 x weekly - 2 sets - 10 reps - 3 hold - Clam with Resistance  - 1 x daily - 5-7 x weekly - 2 sets - 10 reps - Gastroc Stretch on Wall  - 1 x daily - 5-7 x weekly - 3 reps - 30 hold - Active Straight Leg Raise with Quad Set  - 1 x daily - 5-7 x weekly - 2 sets - 10 reps --------------------------------------------------------------------------------------------- ASSESSMENT:  CLINICAL IMPRESSION: Interventions today were  geared towards LE strengthening and mobility Tolerated all activities without worsening of symptoms. Demonstrated appropriate levels of fatigue. Provided slight amount of cueing to ensure correct execution of activity with good carry-over. To date, skilled PT is required to address the impairments and improve function.    IE: Patient is a 62 y.o. y.o. female who was seen today for physical therapy evaluation and treatment for left knee pain, difficulty walking . Patient presents to PT with the following objective impairments: Abnormal gait, decreased activity tolerance, decreased endurance, decreased mobility, difficulty walking, decreased strength, improper body mechanics, and pain. These impairments limit the patient in activities such as carrying, lifting, bending, standing, squatting, stairs, transfers, and locomotion level. These impairments also limit the patient in participation such as meal prep, cleaning, laundry, shopping, community activity, and yard work. The patient will benefit from PT to address the limitations/impairments listed below to return to their prior level of function in the domains of activity and participation.    PERSONAL FACTORS:  n/a  are also affecting patient's functional outcome.   REHAB POTENTIAL: Good  CLINICAL DECISION MAKING: Stable/uncomplicated  EVALUATION COMPLEXITY: Low  --------------------------------------------------------------------------------------------- GOALS: Goals reviewed with patient? No  SHORT TERM GOALS: Target date: 07/14/2023    Patient will score a >/= 40/80 on the LEFS    to demonstrate a Minimally Clinically Importance Difference (MCID) in ADL completion, home/community ambulation, and lifting/bending/squatting.  Baseline: Goal status: INITIAL   2. Patient will be independent with a basic stretching/strengthening HEP  Baseline:  Goal status: INITIAL   LONG TERM GOALS: Target date: 08/04/2023    Patient will score a >/=  48/80 on the LEFS    to demonstrate a Minimally Clinically Importance Difference (MCID) in ADL completion, home/community ambulation, and lifting/bending/squatting. Baseline:  Goal status: INITIAL  2.  Patient will complete >/= 245 feet on the  2 minute walk test:    to demonstrate an improvement in ADL completion, stair negotiation, household/community ambulation, and self-care Baseline:  Goal status: INITIAL  3.  Patient will be independent with a comprehensive strengthening HEP  Baseline:  Goal status: INITIAL  4. Patient will be able to demonstrate left knee flexion active range of motion to 0-120 degrees to facilitate ADL completion, lifting, squatting. Baseline:  Goal status: INITIAL  --------------------------------------------------------------------------------------------- PLAN:  PT FREQUENCY: 1-2x/week  PT DURATION: 6 weeks  PLANNED INTERVENTIONS: 97110-Therapeutic exercises, 97530- Therapeutic activity, 97112- Neuromuscular re-education, 97535- Self Care, [id number]- Manual therapy, [phone]- Gait training, Patient/Family education, Balance training, Stair training, Dry Needling, Joint mobilization, Joint manipulation, Spinal manipulation, Spinal mobilization, Cryotherapy, and Moist heat.  PLAN FOR NEXT SESSION: Continue with progression of lower extremity strength exercises    Iantha Fallen L. Rilda Bulls, PT, DPT, OCS Board-Certified Clinical Specialist in Orthopedic PT PT Compact Privilege # (Blum): [id number] T 08/17/2023, 11:22 AM

## 2023-08-22 ENCOUNTER — Other Ambulatory Visit: Payer: Self-pay | Admitting: Internal Medicine

## 2023-08-24 ENCOUNTER — Ambulatory Visit (HOSPITAL_COMMUNITY): Payer: 59 | Attending: Orthopedic Surgery

## 2023-08-24 DIAGNOSIS — M25562 Pain in left knee: Secondary | ICD-10-CM | POA: Insufficient documentation

## 2023-08-24 DIAGNOSIS — M6281 Muscle weakness (generalized): Secondary | ICD-10-CM | POA: Diagnosis present

## 2023-08-24 DIAGNOSIS — R262 Difficulty in walking, not elsewhere classified: Secondary | ICD-10-CM | POA: Insufficient documentation

## 2023-08-24 DIAGNOSIS — G8929 Other chronic pain: Secondary | ICD-10-CM | POA: Diagnosis present

## 2023-08-24 NOTE — Therapy (Signed)
 OUTPATIENT PHYSICAL THERAPY TREATMENT   Patient Name: Sarah [name] MRN: [id number] DOB:1961-06-18, 63 y.o., female Today's Date: 08/24/2023  END OF SESSION:   PT End of Session - 08/24/23 1018     Visit Number 8    Authorization Type [employer] (as of 08/23/23)    Authorization Time Period No auth    Progress Note Due on Visit 10    PT Start Time 1015    PT Stop Time 1055    PT Time Calculation (min) 40 min    Activity Tolerance Patient tolerated treatment well    Behavior During Therapy WFL for tasks assessed/performed             Past Medical History:  Diagnosis Date   Anxiety    Diabetes mellitus    type II-pt uses insulin  pump   Diverticulosis    GERD (gastroesophageal reflux disease)    Hypercholesteremia    Hypertension    Insulin  pump in place    DM type II   Past Surgical History:  Procedure Laterality Date   ABDOMINAL HYSTERECTOMY     complete   CARDIAC CATHETERIZATION  2011   COLONOSCOPY  01/04/2010   left sided transverse diverticula/two diminutive rectal polyps   COLONOSCOPY  03/28/2012   Surgeon: Lamar CHRISTELLA Hollingshead, MD; riable anal canal with suspected trivial rectal bleeding from this location, colonic diverticulosis, s/p segmental resection, single diminutive polyp in the descending colon resected.  Pathology was benign.  Recommended 10-year repeat.   COLONOSCOPY N/A 09/11/2018   Surgeon: Mavis Anes, MD;   moderate diverticulosis in the descending colon, otherwise normal exam.  Recommended 10-year repeat.   FOOT SURGERY     right-tendon repair   laparoscopic sigmoid colectomy  02/01/2010   Dr Jenkins-diverticulitis   TRIGGER FINGER RELEASE Left 03/2015   TRIGGER FINGER RELEASE Right 02/25/2016   Procedure: RIGHT LONG TRIGGER FINGER RELEASE;  Surgeon: Taft FORBES Minerva, MD;  Location: [facility];  Service: Orthopedics;  Laterality: Right;   UMBILICAL HERNIA REPAIR N/A 02/05/2020   Procedure: UMBILICAL HERNIORRHAPY WITH MESH;  Surgeon: Mavis Anes, MD;   Location: [facility];  Service: General;  Laterality: N/A;   Patient Active Problem List   Diagnosis Date Noted   Urinary frequency 10/14/2022   Type 2 diabetes mellitus with diabetic polyneuropathy, with long-term current use of insulin  (HCC) 06/07/2022   Type 2 diabetes mellitus with hyperglycemia, with long-term current use of insulin  (HCC) 06/07/2022   Acute pyelonephritis 03/05/2022   Hypoalbuminemia due to protein-calorie malnutrition (HCC) 03/05/2022   Lower abdominal pain 12/10/2021   Hernia of abdominal cavity 01/03/2021   Elevated LFTs 01/03/2021   History of severe acute respiratory syndrome coronavirus 2 (SARS-CoV-2) disease 01/03/2021   Hardening of the aorta (main artery of the heart) (HCC) 01/03/2021   Neuropathy 01/03/2021   Obesity 01/03/2021   Ventricular tachycardia (HCC) 01/03/2021   Umbilical hernia without obstruction and without gangrene    Special screening for malignant neoplasms, colon    Diverticulosis of large intestine without diverticulitis    History of palpitations in adulthood 07/02/2018   Murmur, cardiac 07/02/2018   History of chest pain 06/27/2018   Essential hypertension 06/27/2018   Trigger middle finger    Type 2 diabetes mellitus with obesity (HCC) 08/19/2015   Herpes simplex 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 08/19/2015   Visit for routine gyn exam 08/19/2015   Diabetes mellitus with hyperglycemia (HCC) 06/24/2013   Rectal bleed 03/23/2012   Mixed hyperlipidemia 11/30/2009   Anxiety state 11/30/2009  Constipation 11/30/2009    PCP: Shona Norleen PEDLAR, MDPCP - General   REFERRING PROVIDER: Margrette Taft BRAVO, MDRef Provider   REFERRING DIAG: [phone] (ICD-10-CM) - Chronic pain of left knee   Rationale for Evaluation and Treatment: Rehabilitation  THERAPY DIAG:  Chronic pain of left knee  Difficulty in walking, not elsewhere classified  Muscle weakness (generalized)  ONSET DATE: ~1  month --------------------------------------------------------------------------------------------- SUBJECTIVE:                                                                                                                                                                                           SUBJECTIVE STATEMENT:  Patient states she has been doing HEP; 5/10 left knee    IE: About 1 month ago, patient states left knee pain increased; no MOI specified. Patient went to MD 10/24 who performed x-ray; MD found + OA in the right knee. No cortisone shot performed. Patient referred to OPPT.   PERTINENT HISTORY:  DM II  Chronic left knee pain Knee    PAIN:  Are you having pain? Yes: NPRS scale: 4-5/10 Pain location: lateral left knee Pain description: throbbing Aggravating factors: walking Relieving factors: rest  PRECAUTIONS: None  RED FLAGS: None   WEIGHT BEARING RESTRICTIONS: No  FALLS:  Has patient fallen in last 6 months? No  OCCUPATION: retired runner, broadcasting/film/video   PLOF: Independent  PATIENT GOALS: To walk pain-free   NEXT MD VISIT: Jan 23 --------------------------------------------------------------------------------------------- OBJECTIVE:   DIAGNOSTIC FINDINGS:  Narrative & Impression  Left knee pain    3 views left knee   Normal alignment   Osteophyte seen medially small   Patellofemoral alignment normal with what appears to be narrowing of the lateral facet and the lateral trochlea   Mild grade 2 disease definitive with possibility of joint space narrowing    PATIENT SURVEYS: Lower Extremity Functional Scale (LEFS): 32/80   SCREENING FOR RED FLAGS: Bowel or bladder incontinence: No Spinal tumors: No Cauda equina syndrome: No Compression fracture: No Abdominal aneurysm: No  COGNITION: Overall cognitive status: Within functional limits for tasks assessed  POSTURE: No Significant postural limitations      FUNCTIONAL TESTS:  2 minute walk test: 215  feet   GAIT ANALYSIS: Distance walked: 215  Assistive device utilized: Single point cane Level of assistance: SBA Comments: Patient with moderate antalgia on left lower extremity; moderate weight shift to the right side; patient with decreased knee flexion through stance   SENSATION: WFL    LOWER EXTREMITY MMT:    MMT Right eval Left eval  Hip flexion 3+/5 3+/5  Hip extension    Hip abduction    Hip  adduction    Hip internal rotation    Hip external rotation    Knee flexion 4-/5 3/5  Knee extension 4-/5 3/5  Ankle dorsiflexion 4-/5   Ankle plantarflexion    Ankle inversion    Ankle eversion     (Blank rows = not tested)  LOWER EXTREMITY ROM:     Active  Right eval Left eval  Hip flexion    Hip extension    Hip abduction    Hip adduction    Hip internal rotation    Hip external rotation    Knee flexion  0-110 pain  Knee extension  (3)  Ankle dorsiflexion    Ankle plantarflexion    Ankle inversion    Ankle eversion     (Blank rows = not tested)  LOWER EXTREMITY SPECIAL TESTS + Varus stress test for pain, left LCL      PALPATION: Moderate tenderness to palpation left LCL   --------------------------------------------------------------------------------------------- TODAY'S TREATMENT:                                                                                                                              DATE:   08/24/23 Seated NuStep, level 2, seat 8, x1 lap Supine  Manual therapy to left knee: soft tissue mobilization for pain management  SAQS with 5# 2x10  LAQS with 5# 2x10   Left SLRs with 1# Prone   Knee flexion with 3# 2x10    08/17/23 NuStep, level 1, seat 8, 5' Seated L hamstring stretch x 30 x 3 Supine L hip flexor stretch with LE dangling on the side of the bed x 30 Seated L hip flexor stretch x 30 x 2 Bridging x 3 x 10 x 2 Sidelying L clamshells, RTB x 10 x 2 L SLR x 10 x 2 x 2 lbs L runner's stretch x 3 x  3  08/10/23 Seated Bike Warm Up L2 Supine  Left SAQs with 2# 2x10  Left partial SLRs with 1/2# 2x10 Prone  Left knee flexion with 3# 2x10  Left hip extension with knee bent with 3# 2x10  Manual therapy to left hip; gentle hip flexor stretch, instrument-assisted soft tissue mobilization to left calf/ HS Standing  Heel raises on slant board 2x10   08/01/23 Prone  Manual therapy to left lower extremity: soft tissue mobilization to left calf, hamstring, manual stretching for pain management, muscle spasms  Education on proper hydration for cramping management Seated  NuStep L3   07/05/23 MATRIX bike warm up L2 seat 10 Seated  Leg press 50# 2x10  Leg curl with 50# 2x10 Supine  SAQs with 5# 3x10  Hip bridges 2x10  DKTC with G physioball 2x10   Bridges with G physioball 2x10  06/30/23 MATRIX bike warm up L2 Supine  DKTC with Green physioball   SAQs with 3# Seated  Leg press 40# 2x10  Leg curl with 40# 2x10   06/28/23 MATRIX bike warm up L2  Supine   Manual therapy to left knee: PROM, gentle soft tissue mobilization, mobilization with movement into knee flexion   AAROM left knee flexion + strap  Banker  SAQ with 2#    PATIENT EDUCATION:  Education details: assistive device use, pain management, activity modification  Person educated: Patient Education method: Explanation Education comprehension: verbalized understanding  HOME EXERCISE PROGRAM: Access Code: [email or web address] URL: [email or web address] Date: 08/24/2023 Prepared by: Deward Ming  Exercises - Supine Hip Flexion with Ankle Weight  - 1 x daily - 3 sets - 10 reps - Supine Bridge  - 1 x daily - 3 sets - 10 reps - Prone Hamstring Curl with Ankle Weight  - 1 x daily - 3 sets - 10 reps - Seated Long Arc Quad with Ankle Weight  - 1 x daily - 3 sets - 10 reps --------------------------------------------------------------------------------------------- ASSESSMENT:  CLINICAL  IMPRESSION: Interventions today were geared towards LE strengthening and mobility Tolerated all activities without worsening of symptoms. Demonstrated appropriate levels of fatigue. Provided slight amount of cueing to ensure correct execution of activity with good carry-over. To date, skilled PT is required to address the impairments and improve function.    IE: Patient is a 63 y.o. y.o. female who was seen today for physical therapy evaluation and treatment for left knee pain, difficulty walking . Patient presents to PT with the following objective impairments: Abnormal gait, decreased activity tolerance, decreased endurance, decreased mobility, difficulty walking, decreased strength, improper body mechanics, and pain. These impairments limit the patient in activities such as carrying, lifting, bending, standing, squatting, stairs, transfers, and locomotion level. These impairments also limit the patient in participation such as meal prep, cleaning, laundry, shopping, community activity, and yard work. The patient will benefit from PT to address the limitations/impairments listed below to return to their prior level of function in the domains of activity and participation.    PERSONAL FACTORS:  n/a  are also affecting patient's functional outcome.   REHAB POTENTIAL: Good  CLINICAL DECISION MAKING: Stable/uncomplicated  EVALUATION COMPLEXITY: Low  --------------------------------------------------------------------------------------------- GOALS: Goals reviewed with patient? No  SHORT TERM GOALS: Target date: 07/14/2023    Patient will score a >/= 40/80 on the LEFS    to demonstrate a Minimally Clinically Importance Difference (MCID) in ADL completion, home/community ambulation, and lifting/bending/squatting.  Baseline: Goal status: INITIAL   2. Patient will be independent with a basic stretching/strengthening HEP  Baseline:  Goal status: INITIAL   LONG TERM GOALS: Target date:  08/04/2023    Patient will score a >/= 48/80 on the LEFS    to demonstrate a Minimally Clinically Importance Difference (MCID) in ADL completion, home/community ambulation, and lifting/bending/squatting. Baseline:  Goal status: INITIAL  2.  Patient will complete >/= 245 feet on the  2 minute walk test:    to demonstrate an improvement in ADL completion, stair negotiation, household/community ambulation, and self-care Baseline:  Goal status: INITIAL  3.  Patient will be independent with a comprehensive strengthening HEP  Baseline:  Goal status: INITIAL  4. Patient will be able to demonstrate left knee flexion active range of motion to 0-120 degrees to facilitate ADL completion, lifting, squatting. Baseline:  Goal status: INITIAL  --------------------------------------------------------------------------------------------- PLAN:  PT FREQUENCY: 1-2x/week  PT DURATION: 6 weeks  PLANNED INTERVENTIONS: 97110-Therapeutic exercises, 97530- Therapeutic activity, 97112- Neuromuscular re-education, 97535- Self Care, [id number]- Manual therapy, [phone]- Gait training, Patient/Family education, Balance training, Stair training, Dry Needling, Joint mobilization, Joint manipulation, Spinal manipulation, Spinal mobilization, Cryotherapy, and Moist heat.  PLAN FOR  NEXT SESSION: Continue with progression of lower extremity strength exercises    Deward Ming PT, DPT  08/24/2023, 10:20 AM

## 2023-08-29 ENCOUNTER — Ambulatory Visit (HOSPITAL_COMMUNITY): Payer: 59

## 2023-08-29 DIAGNOSIS — G8929 Other chronic pain: Secondary | ICD-10-CM

## 2023-08-29 DIAGNOSIS — R262 Difficulty in walking, not elsewhere classified: Secondary | ICD-10-CM

## 2023-08-29 DIAGNOSIS — M25562 Pain in left knee: Secondary | ICD-10-CM | POA: Diagnosis not present

## 2023-08-29 DIAGNOSIS — M6281 Muscle weakness (generalized): Secondary | ICD-10-CM

## 2023-08-29 NOTE — Therapy (Signed)
 OUTPATIENT PHYSICAL THERAPY TREATMENT .PHYSICAL THERAPY DISCHARGE SUMMARY  Visits from Start of Care: 9  Current functional level related to goals / functional outcomes: See below   Remaining deficits: See below   Education / Equipment: N/a   Patient agrees to discharge. Patient goals were met. Patient is being discharged due to meeting the stated rehab goals.   Patient Name: Sarah [name] MRN: [id number] DOB:June 05, 1961, 63 y.o., female Today's Date: 08/29/2023  END OF SESSION:   PT End of Session - 08/29/23 1014     Visit Number 9    Authorization Type [employer] (as of 08/23/23)    Authorization Time Period No auth    Progress Note Due on Visit 10    PT Start Time 1015    PT Stop Time 1055    PT Time Calculation (min) 40 min    Activity Tolerance Patient tolerated treatment well    Behavior During Therapy WFL for tasks assessed/performed             Past Medical History:  Diagnosis Date   Anxiety    Diabetes mellitus    type II-pt uses insulin  pump   Diverticulosis    GERD (gastroesophageal reflux disease)    Hypercholesteremia    Hypertension    Insulin  pump in place    DM type II   Past Surgical History:  Procedure Laterality Date   ABDOMINAL HYSTERECTOMY     complete   CARDIAC CATHETERIZATION  2011   COLONOSCOPY  01/04/2010   left sided transverse diverticula/two diminutive rectal polyps   COLONOSCOPY  03/28/2012   Surgeon: Lamar CHRISTELLA Hollingshead, MD; riable anal canal with suspected trivial rectal bleeding from this location, colonic diverticulosis, s/p segmental resection, single diminutive polyp in the descending colon resected.  Pathology was benign.  Recommended 10-year repeat.   COLONOSCOPY N/A 09/11/2018   Surgeon: Mavis Anes, MD;   moderate diverticulosis in the descending colon, otherwise normal exam.  Recommended 10-year repeat.   FOOT SURGERY     right-tendon repair   laparoscopic sigmoid colectomy  02/01/2010   Dr Jenkins-diverticulitis    TRIGGER FINGER RELEASE Left 03/2015   TRIGGER FINGER RELEASE Right 02/25/2016   Procedure: RIGHT LONG TRIGGER FINGER RELEASE;  Surgeon: Taft FORBES Minerva, MD;  Location: [facility];  Service: Orthopedics;  Laterality: Right;   UMBILICAL HERNIA REPAIR N/A 02/05/2020   Procedure: UMBILICAL HERNIORRHAPY WITH MESH;  Surgeon: Mavis Anes, MD;  Location: [facility];  Service: General;  Laterality: N/A;   Patient Active Problem List   Diagnosis Date Noted   Urinary frequency 10/14/2022   Type 2 diabetes mellitus with diabetic polyneuropathy, with long-term current use of insulin  (HCC) 06/07/2022   Type 2 diabetes mellitus with hyperglycemia, with long-term current use of insulin  (HCC) 06/07/2022   Acute pyelonephritis 03/05/2022   Hypoalbuminemia due to protein-calorie malnutrition (HCC) 03/05/2022   Lower abdominal pain 12/10/2021   Hernia of abdominal cavity 01/03/2021   Elevated LFTs 01/03/2021   History of severe acute respiratory syndrome coronavirus 2 (SARS-CoV-2) disease 01/03/2021   Hardening of the aorta (main artery of the heart) (HCC) 01/03/2021   Neuropathy 01/03/2021   Obesity 01/03/2021   Ventricular tachycardia (HCC) 01/03/2021   Umbilical hernia without obstruction and without gangrene    Special screening for malignant neoplasms, colon    Diverticulosis of large intestine without diverticulitis    History of palpitations in adulthood 07/02/2018   Murmur, cardiac 07/02/2018   History of chest pain 06/27/2018   Essential hypertension 06/27/2018  Trigger middle finger    Type 2 diabetes mellitus with obesity (HCC) 08/19/2015   Herpes simplex 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 08/19/2015   Visit for routine gyn exam 08/19/2015   Diabetes mellitus with hyperglycemia (HCC) 06/24/2013   Rectal bleed 03/23/2012   Mixed hyperlipidemia 11/30/2009   Anxiety state 11/30/2009   Constipation 11/30/2009    PCP: Shona Norleen PEDLAR, MDPCP - General   REFERRING PROVIDER: Margrette Taft BRAVO, MDRef  Provider   REFERRING DIAG: [phone] (ICD-10-CM) - Chronic pain of left knee   Rationale for Evaluation and Treatment: Rehabilitation  THERAPY DIAG:  Chronic pain of left knee  Difficulty in walking, not elsewhere classified  Muscle weakness (generalized)  ONSET DATE: ~1 month --------------------------------------------------------------------------------------------- SUBJECTIVE:                                                                                                                                                                                           SUBJECTIVE STATEMENT:  Patient states she has been doing HEP; 5/10 left knee    IE: About 1 month ago, patient states left knee pain increased; no MOI specified. Patient went to MD 10/24 who performed x-ray; MD found + OA in the right knee. No cortisone shot performed. Patient referred to OPPT.   PERTINENT HISTORY:  DM II  Chronic left knee pain Knee    PAIN:  Are you having pain? Yes: NPRS scale: 4-5/10 Pain location: lateral left knee Pain description: throbbing Aggravating factors: walking Relieving factors: rest  PRECAUTIONS: None  RED FLAGS: None   WEIGHT BEARING RESTRICTIONS: No  FALLS:  Has patient fallen in last 6 months? No  OCCUPATION: retired runner, broadcasting/film/video   PLOF: Independent  PATIENT GOALS: To walk pain-free   NEXT MD VISIT: Jan 23 --------------------------------------------------------------------------------------------- OBJECTIVE:   DIAGNOSTIC FINDINGS:  Narrative & Impression  Left knee pain    3 views left knee   Normal alignment   Osteophyte seen medially small   Patellofemoral alignment normal with what appears to be narrowing of the lateral facet and the lateral trochlea   Mild grade 2 disease definitive with possibility of joint space narrowing    PATIENT SURVEYS: Lower Extremity Functional Scale (LEFS): 32/80   08/29/23 Lower Extremity Functional Score: 54 / 80 =  67.5 %  SCREENING FOR RED FLAGS: Bowel or bladder incontinence: No Spinal tumors: No Cauda equina syndrome: No Compression fracture: No Abdominal aneurysm: No  COGNITION: Overall cognitive status: Within functional limits for tasks assessed  POSTURE: No Significant postural limitations      FUNCTIONAL TESTS:  2 minute walk test: 215 feet  08/29/23 435 feet with no AD  GAIT ANALYSIS: Distance walked: 215  Assistive device utilized: Single point cane Level of assistance: SBA Comments: Patient with moderate antalgia on left lower extremity; moderate weight shift to the right side; patient with decreased knee flexion through stance   SENSATION: WFL    LOWER EXTREMITY MMT:    MMT Right eval Left eval 08/29/23 Left   Hip flexion 3+/5 3+/5 4-/5  Hip extension     Hip abduction     Hip adduction     Hip internal rotation     Hip external rotation     Knee flexion 4-/5 3/5 4-/5  Knee extension 4-/5 3/5 4-/5  Ankle dorsiflexion 4-/5    Ankle plantarflexion     Ankle inversion     Ankle eversion      (Blank rows = not tested)  LOWER EXTREMITY ROM:     Active  Right eval Left eval Left  08/29/23  Hip flexion     Hip extension     Hip abduction     Hip adduction     Hip internal rotation     Hip external rotation     Knee flexion  0-110 pain WFL  Knee extension  (3) WFL  Ankle dorsiflexion     Ankle plantarflexion     Ankle inversion     Ankle eversion      (Blank rows = not tested)  LOWER EXTREMITY SPECIAL TESTS + Varus stress test for pain, left LCL      PALPATION: Moderate tenderness to palpation left LCL   --------------------------------------------------------------------------------------------- TODAY'S TREATMENT:                                                                                                                              DATE:   08/29/23 PT Discharge   08/24/23 Seated NuStep, level 2, seat 8, x1 lap Supine  Manual therapy to  left knee: soft tissue mobilization for pain management  SAQS with 5# 2x10  LAQS with 5# 2x10   Left SLRs with 1# Prone   Knee flexion with 3# 2x10    08/17/23 NuStep, level 1, seat 8, 5' Seated L hamstring stretch x 30 x 3 Supine L hip flexor stretch with LE dangling on the side of the bed x 30 Seated L hip flexor stretch x 30 x 2 Bridging x 3 x 10 x 2 Sidelying L clamshells, RTB x 10 x 2 L SLR x 10 x 2 x 2 lbs L runner's stretch x 3 x 3  08/10/23 Seated Bike Warm Up L2 Supine  Left SAQs with 2# 2x10  Left partial SLRs with 1/2# 2x10 Prone  Left knee flexion with 3# 2x10  Left hip extension with knee bent with 3# 2x10  Manual therapy to left hip; gentle hip flexor stretch, instrument-assisted soft tissue mobilization to left calf/ HS Standing  Heel raises on slant board 2x10   08/01/23 Prone  Manual therapy to left lower extremity:  soft tissue mobilization to left calf, hamstring, manual stretching for pain management, muscle spasms  Education on proper hydration for cramping management Seated  NuStep L3   07/05/23 MATRIX bike warm up L2 seat 10 Seated  Leg press 50# 2x10  Leg curl with 50# 2x10 Supine  SAQs with 5# 3x10  Hip bridges 2x10  DKTC with G physioball 2x10   Bridges with G physioball 2x10  06/30/23 MATRIX bike warm up L2 Supine  DKTC with Green physioball   SAQs with 3# Seated  Leg press 40# 2x10  Leg curl with 40# 2x10   06/28/23 MATRIX bike warm up L2 Supine   Manual therapy to left knee: PROM, gentle soft tissue mobilization, mobilization with movement into knee flexion   AAROM left knee flexion + strap  Quad sets  SAQ with 2#    PATIENT EDUCATION:  Education details: assistive device use, pain management, activity modification  Person educated: Patient Education method: Explanation Education comprehension: verbalized understanding  HOME EXERCISE PROGRAM: Access Code: [email or web address] URL:  [email or web address] Date: 08/29/2023 Prepared by: Deward Ming  Exercises - Supine Hip Flexion with Ankle Weight  - 1 x daily - 3 sets - 10 reps - Supine Bridge  - 1 x daily - 3 sets - 10 reps - Prone Hamstring Curl with Ankle Weight  - 1 x daily - 3 sets - 10 reps - Seated Long Arc Quad with Ankle Weight  - 1 x daily - 3 sets - 10 reps - Standing Hip Abduction with Counter Support  - 1 x daily - 3 sets - 10 reps - Standing Knee Flexion with Ankle Weight  - 1 x daily - 3 sets - 10 reps - Standing March with Counter Support  - 1 x daily - 3 sets - 10 reps --------------------------------------------------------------------------------------------- ASSESSMENT:  CLINICAL IMPRESSION: .Patient has shown overall improvements in Gait Speed during , active range of motion, strength, and ADL completion . Patient has completed (9) visits since the initial evaluation and has met 6/6 stated rehab goal. At this moment, PT discharge patient to HEP due to meeting the stated rehab goals..     IE: Patient is a 63 y.o. y.o. female who was seen today for physical therapy evaluation and treatment for left knee pain, difficulty walking . Patient presents to PT with the following objective impairments: Abnormal gait, decreased activity tolerance, decreased endurance, decreased mobility, difficulty walking, decreased strength, improper body mechanics, and pain. These impairments limit the patient in activities such as carrying, lifting, bending, standing, squatting, stairs, transfers, and locomotion level. These impairments also limit the patient in participation such as meal prep, cleaning, laundry, shopping, community activity, and yard work. The patient will benefit from PT to address the limitations/impairments listed below to return to their prior level of function in the domains of activity and participation.    PERSONAL FACTORS:  n/a  are also affecting patient's functional outcome.    REHAB POTENTIAL: Good  CLINICAL DECISION MAKING: Stable/uncomplicated  EVALUATION COMPLEXITY: Low  --------------------------------------------------------------------------------------------- GOALS: Goals reviewed with patient? No  SHORT TERM GOALS: Target date: 07/14/2023    Patient will score a >/= 40/80 on the LEFS    to demonstrate a Minimally Clinically Importance Difference (MCID) in ADL completion, home/community ambulation, and lifting/bending/squatting.  Baseline: Goal status: goal met    2. Patient will be independent with a basic stretching/strengthening HEP  Baseline:  Goal status: goal met    LONG TERM GOALS: Target date: 08/04/2023    Patient will  score a >/= 48/80 on the LEFS    to demonstrate a Minimally Clinically Importance Difference (MCID) in ADL completion, home/community ambulation, and lifting/bending/squatting. Baseline:  Goal status: goal met   2.  Patient will complete >/= 245 feet on the  2 minute walk test:    to demonstrate an improvement in ADL completion, stair negotiation, household/community ambulation, and self-care Baseline:  Goal status: goal met   3.  Patient will be independent with a comprehensive strengthening HEP  Baseline:  Goal status: goal met   4. Patient will be able to demonstrate left knee flexion active range of motion to 0-120 degrees to facilitate ADL completion, lifting, squatting. Baseline:  Goal status: goal met   --------------------------------------------------------------------------------------------- PLAN:  PT FREQUENCY: 1-2x/week  PT DURATION: 6 weeks  PLANNED INTERVENTIONS: 97110-Therapeutic exercises, 97530- Therapeutic activity, 97112- Neuromuscular re-education, 97535- Self Care, [id number]- Manual therapy, [phone]- Gait training, Patient/Family education, Balance training, Stair training, Dry Needling, Joint mobilization, Joint manipulation, Spinal manipulation, Spinal mobilization, Cryotherapy, and  Moist heat.  PLAN FOR NEXT SESSION: Continue with progression of lower extremity strength exercises    Deward Ming PT, DPT  08/29/2023, 10:15 AM

## 2023-09-08 ENCOUNTER — Telehealth: Payer: Self-pay

## 2023-09-08 NOTE — Telephone Encounter (Signed)
Patient will stop by and pick up Dexcom samples. They will be placed up front

## 2023-09-14 ENCOUNTER — Ambulatory Visit: Payer: BC Managed Care – PPO | Admitting: Orthopedic Surgery

## 2023-09-21 ENCOUNTER — Encounter: Payer: Self-pay | Admitting: Internal Medicine

## 2023-09-21 ENCOUNTER — Ambulatory Visit: Payer: No Typology Code available for payment source | Admitting: Internal Medicine

## 2023-09-21 VITALS — BP 126/80 | HR 73 | Ht 63.0 in | Wt 189.0 lb

## 2023-09-21 DIAGNOSIS — Z794 Long term (current) use of insulin: Secondary | ICD-10-CM | POA: Diagnosis not present

## 2023-09-21 DIAGNOSIS — R809 Proteinuria, unspecified: Secondary | ICD-10-CM

## 2023-09-21 DIAGNOSIS — E1129 Type 2 diabetes mellitus with other diabetic kidney complication: Secondary | ICD-10-CM | POA: Diagnosis not present

## 2023-09-21 DIAGNOSIS — E1142 Type 2 diabetes mellitus with diabetic polyneuropathy: Secondary | ICD-10-CM

## 2023-09-21 DIAGNOSIS — E1165 Type 2 diabetes mellitus with hyperglycemia: Secondary | ICD-10-CM

## 2023-09-21 LAB — POCT GLYCOSYLATED HEMOGLOBIN (HGB A1C): Hemoglobin A1C: 11.6 % — AB (ref 4.0–5.6)

## 2023-09-21 LAB — MICROALBUMIN / CREATININE URINE RATIO
Creatinine, Urine: 59 mg/dL (ref 20–275)
Microalb Creat Ratio: 42 mg/g{creat} — ABNORMAL HIGH (ref <30)
Microalb, Ur: 2.5 mg/dL

## 2023-09-21 MED ORDER — NOVOLOG FLEXPEN 100 UNIT/ML ~~LOC~~ SOPN: PEN_INJECTOR | SUBCUTANEOUS | 3 refills | Status: DC

## 2023-09-21 MED ORDER — TRESIBA FLEXTOUCH 100 UNIT/ML ~~LOC~~ SOPN: 55.0000 [IU] | PEN_INJECTOR | Freq: Every day | SUBCUTANEOUS | 3 refills | Status: DC

## 2023-09-21 MED ORDER — PEN NEEDLES 32G X 4 MM MISC: 1.0000 | Freq: Four times a day (QID) | 3 refills | Status: DC

## 2023-09-21 NOTE — Patient Instructions (Signed)
Restart Mounjaro 7.5 mg weekly Start Tresiba (long-acting insulin) 55 units once daily Start NovoLog 10 units with each meal (breakfast, lunch, dinner) Novolog correctional insulin: ADD extra units on insulin to your meal-time Novolog dose if your blood sugars are higher than 150. Use the scale below to help guide you:   Blood sugar before meal Number of units to inject  Less than 150 0 unit  151 -  170 1 units  171 -  190 2 units  191 -  210 3 units  211 -  230 4 units  231 -  250 5 units  251 -  270 6 units  271 -  290 7 units  291 -  310 8 units  311 - 330 9 units    HOW TO TREAT LOW BLOOD SUGARS (Blood sugar LESS THAN 70 MG/DL) Please follow the RULE OF 15 for the treatment of hypoglycemia treatment (when your (blood sugars are less than 70 mg/dL)   STEP 1: Take 15 grams of carbohydrates when your blood sugar is low, which includes:  3-4 GLUCOSE TABS  OR 3-4 OZ OF JUICE OR REGULAR SODA OR ONE TUBE OF GLUCOSE GEL    STEP 2: RECHECK blood sugar in 15 MINUTES STEP 3: If your blood sugar is still low at the 15 minute recheck --> then, go back to STEP 1 and treat AGAIN with another 15 grams of carbohydrates.

## 2023-09-21 NOTE — Progress Notes (Signed)
Name: Sarah [name]  Age/ Sex: 63 y.o., female   MRN/ DOB: [id number], 19-Dec-1960     PCP: Benita Stabile, MD   Reason for Endocrinology Evaluation: Type 2 Diabetes Mellitus  Initial Endocrine Consultative Visit: 04/06/2021    PATIENT IDENTIFIER: Sarah [name] is a 63 y.o. female with a past medical history of DM, HTN, acute pyelonephritis . The patient has followed with Endocrinology clinic since 04/06/2021 for consultative assistance with management of her diabetes.  DIABETIC HISTORY:  Sarah [name] was diagnosed with DM 2002, and started insulin therapy in 2012. Her hemoglobin A1c has ranged from 9.4% in 2021, peaking at 12.4% in 2022.    Tandem was started in 2022   She was followed by Dr. Everardo All from August 2022 until October 2022  Ozempic was cost prohibitive   She was denied the tandem pump 2024, and was eventually started on the bionic insulin pump through a trial directly through the company 2025   SUBJECTIVE:   During the last visit (03/21/2023): A1c 9.6%   Today (09/21/2023): Sarah [name] is here for a follow up on diabetes management.  She checks her blood sugars multiple daily, through the Dexcom.  She is not using the tandem pump anymore, she has been using a trial kit of bionic iLet pump, she was told by the company she will be able to get for $75    Denies recent nausea or vomiting  Has constipation that she attributes to [facility]   This patient with type 2 diabetes is treated with Tandem  (insulin pump). During the visit the pump basal and bolus doses were reviewed including carb/insulin rations and supplemental doses. The clinical list was updated. The glucose meter download was reviewed in detail to determine if the current pump settings are providing the best glycemic control without excessive hypoglycemia.  Pump and meter download:    Pump   iLET Settings   Insulin type   Novolog    Basal rate                Type & Model of Pump: Bionic  pump Insulin Type: Currently using novolog.  Body mass index is 33.48 kg/m.  PUMP STATISTICS: N/a   CONTINUOUS GLUCOSE MONITORING RECORD INTERPRETATION: N/A      HOME DIABETES REGIMEN:  Novolog  Mounjaro 7.5 mg weekly- not taking   Statin: yes ACE-I/ARB: no Prior Diabetic Education: yes      DIABETIC COMPLICATIONS: Microvascular complications:  Neuropathy  Denies: CKD , retinopathy Last Eye Exam: Completed 2022  Macrovascular complications:   Denies: CAD, CVA, PVD   HISTORY:  Past Medical History:  Past Medical History:  Diagnosis Date   Anxiety    Diabetes mellitus    type II-pt uses insulin pump   Diverticulosis    GERD (gastroesophageal reflux disease)    Hypercholesteremia    Hypertension    Insulin pump in place    DM type II   Past Surgical History:  Past Surgical History:  Procedure Laterality Date   ABDOMINAL HYSTERECTOMY     complete   CARDIAC CATHETERIZATION  2011   COLONOSCOPY  01/04/2010   left sided transverse diverticula/two diminutive rectal polyps   COLONOSCOPY  03/28/2012   Surgeon: Corbin Ade, MD; riable anal canal with suspected trivial rectal bleeding from this location, colonic diverticulosis, s/p segmental resection, single diminutive polyp in the descending colon resected.  Pathology was benign.  Recommended 10-year repeat.   COLONOSCOPY N/A 09/11/2018   Surgeon:  Franky Macho, MD;   moderate diverticulosis in the descending colon, otherwise normal exam.  Recommended 10-year repeat.   FOOT SURGERY     right-tendon repair   laparoscopic sigmoid colectomy  02/01/2010   Dr Jenkins-diverticulitis   TRIGGER FINGER RELEASE Left 03/2015   TRIGGER FINGER RELEASE Right 02/25/2016   Procedure: RIGHT LONG TRIGGER FINGER RELEASE;  Surgeon: Vickki Hearing, MD;  Location: [facility];  Service: Orthopedics;  Laterality: Right;   UMBILICAL HERNIA REPAIR N/A 02/05/2020   Procedure: UMBILICAL HERNIORRHAPY WITH MESH;  Surgeon: Franky Macho, MD;  Location: [facility];  Service: General;  Laterality: N/A;   Social History:  reports that she quit smoking about 17 years ago. Her smoking use included cigarettes. She started smoking about 27 years ago. She has a 10 pack-year smoking history. She has never used smokeless tobacco. She reports that she does not currently use alcohol. She reports that she does not use drugs. Family History:  Family History  Problem Relation Age of Onset   Colon polyps Mother        in her 42s   Diabetes Mother    Hypertension Mother    Heart attack Father    Heart murmur Sister    Hypertension Brother    Colon cancer Maternal Grandfather        13   Diabetes Son    Liver disease Neg Hx      HOME MEDICATIONS: Allergies as of 09/21/2023       Reactions   Sulfonamide Derivatives Itching   Latex Itching, Rash, Other (See Comments)   Powdered only        Medication List        Accurate as of September 21, 2023 10:41 AM. If you have any questions, ask your nurse or doctor.          amLODipine 5 MG tablet Commonly known as: NORVASC Take 5 mg by mouth daily.   aspirin EC 81 MG tablet Take 81 mg by mouth daily.   co-enzyme Q-10 30 MG capsule Take 30 mg by mouth daily.   cyclobenzaprine 10 MG tablet Commonly known as: FLEXERIL Take 1 tablet (10 mg total) by mouth 3 (three) times daily as needed for muscle spasms.   Dexcom G6 Transmitter Misc 1 Device by Does not apply route as directed.   Dexcom G7 Sensor Misc 1 Device by Does not apply route as directed.   diclofenac 50 MG EC tablet Commonly known as: VOLTAREN Take 1 tablet (50 mg total) by mouth 2 (two) times daily.   diclofenac Sodium 1 % Gel Commonly known as: Voltaren Apply 2 g topically 4 (four) times daily.   escitalopram 5 MG tablet Commonly known as: LEXAPRO Take 10 mg by mouth at bedtime.   gabapentin 100 MG capsule Commonly known as: NEURONTIN Take 100 mg by mouth 3 (three) times daily.   Gvoke PFS 1  MG/0.2ML Sosy Generic drug: Glucagon SMARTSIG:1 Milligram(s) SUB-Q   ibuprofen 800 MG tablet Commonly known as: ADVIL Take 800 mg by mouth every 6 (six) hours as needed for moderate pain.   Ilet infusion-Inset 23" 6mm Misc CHANGE EVERY TWO (2) DAYS   insulin aspart 100 UNIT/ML injection Commonly known as: novoLOG 100u per day via insulin pump   NovoLOG FlexPen 100 UNIT/ML FlexPen Generic drug: insulin aspart Max daily 15 units   linaclotide 145 MCG Caps capsule Commonly known as: Linzess Take 1 capsule (145 mcg total) by mouth daily before breakfast.  LORazepam 0.5 MG tablet Commonly known as: ATIVAN Take 0.5 mg by mouth 2 (two) times daily as needed for anxiety.   methocarbamol 500 MG tablet Commonly known as: ROBAXIN Take 500 mg by mouth at bedtime.   omeprazole 20 MG capsule Commonly known as: PRILOSEC Take 20 mg by mouth daily.   ondansetron 4 MG tablet Commonly known as: ZOFRAN Take 1 tablet (4 mg total) by mouth every 8 (eight) hours as needed for nausea or vomiting.   oxyCODONE 5 MG immediate release tablet Commonly known as: Roxicodone Take 1 tablet (5 mg total) by mouth every 4 (four) hours as needed for severe pain.   Pen Needles 32G X 4 MM Misc Use to inject insulin   simvastatin 20 MG tablet Commonly known as: ZOCOR Take 20 mg by mouth See admin instructions. Every Monday and Friday   T: slim X2 Ins Pmp/Control 7.4 Devi 1 Device by Does not apply route every other day. This prescription is for T:slimX2 3ml cartridges.   tirzepatide 7.5 MG/0.5ML Pen Commonly known as: MOUNJARO Inject 7.5 mg into the skin once a week.   Vitamin D3 1.25 MG (50000 UT) Caps Take 1 capsule by mouth once a week.         OBJECTIVE:   Vital Signs: BP 126/80 (BP Location: Left Arm, Patient Position: Sitting, Cuff Size: Normal)   Pulse 73   Ht 5\' 3"  (1.6 m)   Wt 189 lb (85.7 kg)   SpO2 97%   BMI 33.48 kg/m   Wt Readings from Last 3 Encounters:  09/21/23 189  lb (85.7 kg)  06/18/23 189 lb (85.7 kg)  06/15/23 190 lb (86.2 kg)     Exam: General: Pt appears well and is in NAD  Lungs: Clear with good BS bilat   Heart: RRR , + systolic murmur  Extremities: No pretibial edema.   Neuro: MS is good with appropriate affect, pt is alert and Ox3    DM foot exam: 03/21/2023      The skin of the feet is intact without sores or ulcerations. The pedal pulses are 2+ on right and 2+ on left. The sensation is decreased  to a screening 5.07, 10 gram monofilament bilaterally at the heels     DATA REVIEWED:  Lab Results  Component Value Date   HGBA1C 11.6 (A) 09/21/2023   HGBA1C 9.6 (A) 03/21/2023   HGBA1C 11.9 (A) 10/14/2022    Latest Reference Range & Units 06/18/23 08:49  Sodium 135 - 145 mmol/L 131 (L)  Potassium 3.5 - 5.1 mmol/L 4.1  Chloride 98 - 111 mmol/L 100  CO2 22 - 32 mmol/L 23  Glucose 70 - 99 mg/dL 161 (H)  BUN 8 - 23 mg/dL 18  Creatinine 0.96 - 0.45 mg/dL 4.09  Calcium 8.9 - 81.1 mg/dL 8.4 (L)  Anion gap 5 - 15  8  Magnesium 1.7 - 2.4 mg/dL 1.8  Alkaline Phosphatase 38 - 126 U/L 89  Albumin 3.5 - 5.0 g/dL 3.1 (L)  AST 15 - 41 U/L 30  ALT 0 - 44 U/L 34  Total Protein 6.5 - 8.1 g/dL 7.1  Total Bilirubin 0.3 - 1.2 mg/dL 0.4  GFR, Estimated >91 mL/min >60       ASSESSMENT / PLAN / RECOMMENDATIONS:   1) Type 2 Diabetes Mellitus, poorly controlled, With Neuropathic complications and microalbuminuria - Most recent A1c of 11.6 %. Goal A1c < 7.0 %.    -Patient continues with intermittent use of insulin pump, patient  with social determinants -She is to be on the tandem pump, she somehow find a way to get the bionic pump but she is waiting to get paid to pay the company? -I did advise the patient that if she is continuously having issues getting DME supplies she may just opt to take insulin injections -Not a candidate for SGLT2 inhibitors due to history of pyelonephritis, she also developed cramps on Jardiance -I have asked  her to restart Mounjaro -I will start her on multiple daily injections of insulin  MEDICATIONS: Continue  Mounjaro 7.5 mg weekly Start Tresiba 55 units daily Start NovoLog 10 units with each meal Start CF: Novolog (BG-130/20) TIDQAC     EDUCATION / INSTRUCTIONS: BG monitoring instructions: Patient is instructed to check her blood sugars 3 times a day, before each meal . Call [facility] if: BG persistently < 70  I reviewed the Rule of 15 for the treatment of hypoglycemia in detail with the patient. Literature supplied.    2) Diabetic complications:  Eye: Does not have known diabetic retinopathy.  Neuro/ Feet: Does  have known diabetic peripheral neuropathy .  Renal: Patient does not have known baseline CKD. She   is not on an ACEI/ARB at present.    3) Microalbuminuria :  -MA/CR ratio elevated -Will emphasize the importance of optimizing glucose control -Will start him on losartan and discontinue amlodipine  Medication  Start losartan 25 mg daily Stop amlodipine 5 mg daily   F/U in 3 months     Signed electronically by: Lyndle Herrlich, MD  [facility] Endocrinology  [facility] [address] [place], [place] [address] Phone: [phone] FAX: [phone]   CC: Benita Stabile, MD [address] [place] [address] Phone: [phone]  Fax: [phone]  Return to Endocrinology clinic as below: No future appointments.

## 2023-09-22 ENCOUNTER — Telehealth: Payer: Self-pay | Admitting: Internal Medicine

## 2023-09-22 ENCOUNTER — Telehealth: Payer: Self-pay

## 2023-09-22 ENCOUNTER — Other Ambulatory Visit (HOSPITAL_COMMUNITY): Payer: Self-pay

## 2023-09-22 DIAGNOSIS — Z794 Long term (current) use of insulin: Secondary | ICD-10-CM | POA: Insufficient documentation

## 2023-09-22 MED ORDER — TIRZEPATIDE 7.5 MG/0.5ML ~~LOC~~ SOAJ: 7.5000 mg | SUBCUTANEOUS | 3 refills | Status: DC

## 2023-09-22 MED ORDER — LOSARTAN POTASSIUM 25 MG PO TABS: 25.0000 mg | ORAL_TABLET | Freq: Every day | ORAL | 3 refills | Status: DC

## 2023-09-22 NOTE — Telephone Encounter (Signed)
Pharmacy Patient Advocate Encounter   Received notification from Pt Calls Messages that prior authorization for Sarah [name] is required/requested.   Insurance verification completed.   The patient is insured through [facility] .   Per test claim: PA required; PA started via CoverMyMeds. KEY [id number] . Waiting for clinical questions to populate.

## 2023-09-22 NOTE — Telephone Encounter (Signed)
Please let the patient know that she is leaking extra protein in the urine, this is a sign that the diabetes is starting to cause kidney damage.   It is very important that she optimizes her glucose as soon as possible.  I would also recommend starting her on medication to protect your kidney called losartan 25 mg daily.  She may stop amlodipine because they are both working as blood pressure medications   Thanks

## 2023-09-22 NOTE — Telephone Encounter (Signed)
Patient notified and will pick up new medication.  

## 2023-09-22 NOTE — Telephone Encounter (Signed)
Mounjaro needs PA per pharmacy [facility].

## 2023-09-28 ENCOUNTER — Telehealth: Payer: Self-pay

## 2023-09-28 NOTE — Telephone Encounter (Signed)
 Patient states that she is experiencing headaches and nausea every time she take the Losartan .  Please advise as Dr. Rosalea Collin has already left for the day.

## 2023-09-29 NOTE — Telephone Encounter (Signed)
 Patient notified

## 2023-10-09 NOTE — Telephone Encounter (Signed)
Pharmacy Patient Advocate Encounter  Received notification from [employer] that Prior Authorization for Sarah [name] has been resolved. No additional PA is required.

## 2023-11-03 ENCOUNTER — Encounter (HOSPITAL_COMMUNITY): Payer: Self-pay

## 2023-11-03 ENCOUNTER — Emergency Department (HOSPITAL_COMMUNITY)
Admission: EM | Admit: 2023-11-03 | Discharge: 2023-11-03 | Disposition: A | Discharge status: Home / Self Care | Attending: Emergency Medicine | Admitting: Emergency Medicine

## 2023-11-03 ENCOUNTER — Emergency Department (HOSPITAL_COMMUNITY)

## 2023-11-03 ENCOUNTER — Other Ambulatory Visit: Payer: Self-pay

## 2023-11-03 DIAGNOSIS — Z91040 Latex allergy status: Secondary | ICD-10-CM | POA: Diagnosis not present

## 2023-11-03 DIAGNOSIS — R300 Dysuria: Secondary | ICD-10-CM | POA: Diagnosis not present

## 2023-11-03 DIAGNOSIS — R079 Chest pain, unspecified: Secondary | ICD-10-CM | POA: Insufficient documentation

## 2023-11-03 DIAGNOSIS — Z7982 Long term (current) use of aspirin: Secondary | ICD-10-CM | POA: Diagnosis not present

## 2023-11-03 DIAGNOSIS — R059 Cough, unspecified: Secondary | ICD-10-CM | POA: Diagnosis present

## 2023-11-03 DIAGNOSIS — J069 Acute upper respiratory infection, unspecified: Secondary | ICD-10-CM

## 2023-11-03 DIAGNOSIS — R0602 Shortness of breath: Secondary | ICD-10-CM | POA: Insufficient documentation

## 2023-11-03 LAB — COMPREHENSIVE METABOLIC PANEL
ALT: 22 U/L (ref 0–44)
AST: 21 U/L (ref 15–41)
Albumin: 3.6 g/dL (ref 3.5–5.0)
Alkaline Phosphatase: 61 U/L (ref 38–126)
Anion gap: 11 (ref 5–15)
BUN: 13 mg/dL (ref 8–23)
CO2: 26 mmol/L (ref 22–32)
Calcium: 9.7 mg/dL (ref 8.9–10.3)
Chloride: 102 mmol/L (ref 98–111)
Creatinine, Ser: 0.71 mg/dL (ref 0.44–1.00)
GFR, Estimated: >60 mL/min (ref >60)
Glucose, Bld: 133 mg/dL — ABNORMAL HIGH (ref 70–99)
Potassium: 4.4 mmol/L (ref 3.5–5.1)
Sodium: 139 mmol/L (ref 135–145)
Total Bilirubin: 0.6 mg/dL (ref 0.0–1.2)
Total Protein: 8.2 g/dL — ABNORMAL HIGH (ref 6.5–8.1)

## 2023-11-03 LAB — CBC WITH DIFFERENTIAL/PLATELET
Abs Immature Granulocytes: 0.02 10*3/uL (ref 0.00–0.07)
Basophils Absolute: 0 10*3/uL (ref 0.0–0.1)
Basophils Relative: 1 %
Eosinophils Absolute: 0.1 10*3/uL (ref 0.0–0.5)
Eosinophils Relative: 2 %
HCT: 40.5 % (ref 36.0–46.0)
Hemoglobin: 14.4 g/dL (ref 12.0–15.0)
Immature Granulocytes: 0 %
Lymphocytes Relative: 43 %
Lymphs Abs: 2.7 10*3/uL (ref 0.7–4.0)
MCH: 28.6 pg (ref 26.0–34.0)
MCHC: 35.6 g/dL (ref 30.0–36.0)
MCV: 80.4 fL (ref 80.0–100.0)
Monocytes Absolute: 0.7 10*3/uL (ref 0.1–1.0)
Monocytes Relative: 11 %
Neutro Abs: 2.7 10*3/uL (ref 1.7–7.7)
Neutrophils Relative %: 43 %
Platelets: 327 10*3/uL (ref 150–400)
RBC: 5.04 MIL/uL (ref 3.87–5.11)
RDW: 14.1 % (ref 11.5–15.5)
WBC: 6.4 10*3/uL (ref 4.0–10.5)
nRBC: 0 % (ref 0.0–0.2)

## 2023-11-03 LAB — URINALYSIS, ROUTINE W REFLEX MICROSCOPIC
Bilirubin Urine: NEGATIVE
Glucose, UA: NEGATIVE mg/dL
Hgb urine dipstick: NEGATIVE
Ketones, ur: NEGATIVE mg/dL
Nitrite: NEGATIVE
Protein, ur: NEGATIVE mg/dL
Specific Gravity, Urine: 1.016 (ref 1.005–1.030)
WBC, UA: >50 WBC/hpf (ref 0–5)
pH: 6 (ref 5.0–8.0)

## 2023-11-03 LAB — TROPONIN I (HIGH SENSITIVITY): Troponin I (High Sensitivity): 5 ng/L (ref <18)

## 2023-11-03 LAB — RESP PANEL BY RT-PCR (RSV, FLU A&B, COVID)  RVPGX2
Influenza A by PCR: NEGATIVE
Influenza B by PCR: NEGATIVE
Resp Syncytial Virus by PCR: NEGATIVE
SARS Coronavirus 2 by RT PCR: NEGATIVE

## 2023-11-03 MED ORDER — NAPROXEN 250 MG PO TABS
375.0000 mg | ORAL_TABLET | Freq: Once | ORAL | Status: AC
  Administered 2023-11-03: 375 mg via ORAL
  Filled 2023-11-03: qty 2

## 2023-11-03 MED ORDER — BENZONATATE 100 MG PO CAPS: 200.0000 mg | ORAL_CAPSULE | Freq: Three times a day (TID) | ORAL | 0 refills | Status: DC | PRN

## 2023-11-03 MED ORDER — BENZONATATE 100 MG PO CAPS
200.0000 mg | ORAL_CAPSULE | Freq: Once | ORAL | Status: AC
  Administered 2023-11-03: 200 mg via ORAL
  Filled 2023-11-03: qty 2

## 2023-11-03 NOTE — ED Triage Notes (Signed)
 Pt stated that she has been coughing a lot lately which has caused her chest to hurt. Pt stated that she has also been having fevers, lightheadedness and feels sick.

## 2023-11-03 NOTE — Discharge Instructions (Addendum)
 As discussed, your labs, chest x-ray and EKG are all reassuring today.  Your urine culture is pending, you do not need an antibiotic at this time unless this culture confirms a significant infection.  You have been prescribed Tessalon to help you with your cough, rest and make sure you are drinking plenty of fluids, you may take ibuprofen or Tylenol for body aches as needed.

## 2023-11-03 NOTE — ED Provider Notes (Signed)
 Patient signed out to me at shift change from [facility], presenting with URI type symptoms including cough, nasal congestion, typical URI type symptoms, also with history of dysuria, she describes occasional dysuria over the past 2 to 3 months since starting [facility] and is also noted her urine to be very pungent and darker in coloration since starting this new medication.  Was pending urinalysis at time of shift change.  Has been completed, moderate leukocytes, rare bacteria, she is nitrite negative.  Given chronicity of symptoms, no fevers, describes "occasional fleeting dysuria", urine culture has been ordered, will hold on antibiotics pending culture results.  She is prescribed Tessalon to help her with her cough symptoms.    Results for orders placed or performed during the hospital encounter of 11/03/23  Resp panel by RT-PCR (RSV, Flu A&B, Covid) Anterior Nasal Swab   Collection Time: 11/03/23  4:46 PM   Specimen: Anterior Nasal Swab  Result Value Ref Range   SARS Coronavirus 2 by RT PCR NEGATIVE NEGATIVE   Influenza A by PCR NEGATIVE NEGATIVE   Influenza B by PCR NEGATIVE NEGATIVE   Resp Syncytial Virus by PCR NEGATIVE NEGATIVE  Comprehensive metabolic panel   Collection Time: 11/03/23  5:12 PM  Result Value Ref Range   Sodium 139 135 - 145 mmol/L   Potassium 4.4 3.5 - 5.1 mmol/L   Chloride 102 98 - 111 mmol/L   CO2 26 22 - 32 mmol/L   Glucose, Bld 133 (H) 70 - 99 mg/dL   BUN 13 8 - 23 mg/dL   Creatinine, Ser 1.61 0.44 - 1.00 mg/dL   Calcium 9.7 8.9 - 09.6 mg/dL   Total Protein 8.2 (H) 6.5 - 8.1 g/dL   Albumin 3.6 3.5 - 5.0 g/dL   AST 21 15 - 41 U/L   ALT 22 0 - 44 U/L   Alkaline Phosphatase 61 38 - 126 U/L   Total Bilirubin 0.6 0.0 - 1.2 mg/dL   GFR, Estimated >04 >54 mL/min   Anion gap 11 5 - 15  CBC with Differential   Collection Time: 11/03/23  5:12 PM  Result Value Ref Range   WBC 6.4 4.0 - 10.5 K/uL   RBC 5.04 3.87 - 5.11 MIL/uL   Hemoglobin 14.4 12.0 - 15.0  g/dL   HCT 09.8 11.9 - 14.7 %   MCV 80.4 80.0 - 100.0 fL   MCH 28.6 26.0 - 34.0 pg   MCHC 35.6 30.0 - 36.0 g/dL   RDW 82.9 56.2 - 13.0 %   Platelets 327 150 - 400 K/uL   nRBC 0.0 0.0 - 0.2 %   Neutrophils Relative % 43 %   Neutro Abs 2.7 1.7 - 7.7 K/uL   Lymphocytes Relative 43 %   Lymphs Abs 2.7 0.7 - 4.0 K/uL   Monocytes Relative 11 %   Monocytes Absolute 0.7 0.1 - 1.0 K/uL   Eosinophils Relative 2 %   Eosinophils Absolute 0.1 0.0 - 0.5 K/uL   Basophils Relative 1 %   Basophils Absolute 0.0 0.0 - 0.1 K/uL   Immature Granulocytes 0 %   Abs Immature Granulocytes 0.02 0.00 - 0.07 K/uL  Troponin I (High Sensitivity)   Collection Time: 11/03/23  5:12 PM  Result Value Ref Range   Troponin I (High Sensitivity) 5 <18 ng/L  Urinalysis, Routine w reflex microscopic -Urine, Clean Catch   Collection Time: 11/03/23  5:35 PM  Result Value Ref Range   Color, Urine YELLOW YELLOW   APPearance HAZY (  A) CLEAR   Specific Gravity, Urine 1.016 1.005 - 1.030   pH 6.0 5.0 - 8.0   Glucose, UA NEGATIVE NEGATIVE mg/dL   Hgb urine dipstick NEGATIVE NEGATIVE   Bilirubin Urine NEGATIVE NEGATIVE   Ketones, ur NEGATIVE NEGATIVE mg/dL   Protein, ur NEGATIVE NEGATIVE mg/dL   Nitrite NEGATIVE NEGATIVE   Leukocytes,Ua MODERATE (A) NEGATIVE   RBC / HPF 0-5 0 - 5 RBC/hpf   WBC, UA >50 0 - 5 WBC/hpf   Bacteria, UA RARE (A) NONE SEEN   Squamous Epithelial / HPF 0-5 0 - 5 /HPF   Mucus PRESENT    DG Chest Port 1 View Result Date: 11/03/2023 CLINICAL DATA:  Chest pain, cough EXAM: PORTABLE CHEST 1 VIEW COMPARISON:  03/03/2022 FINDINGS: The heart size and mediastinal contours are within normal limits. Both lungs are clear. The visualized skeletal structures are unremarkable. IMPRESSION: No active disease. Electronically Signed   By: Charlett Nose M.D.   On: 11/03/2023 18:22        Victoriano Lain 11/03/23 2029    Eber Hong, MD 11/03/23 [phone]

## 2023-11-03 NOTE — ED Provider Triage Note (Signed)
 Emergency Medicine Provider Triage Evaluation Note  Sarah [name] , a 63 y.o. female  was evaluated in triage.  Pt complains of cough, congestion, chest pain, fever, shortness of breath and lightheadedness which has been ongoing for 2 days.  Review of Systems  Positive: Cough, congestion, lightheadedness, chest pain, shortness of breath, dysuria Negative: Abdominal pain, nausea, vomiting, diarrhea  Physical Exam  BP (!) 143/110   Pulse 83   Temp 98.6 F (37 C) (Oral)   Ht 5\' 3"  (1.6 m)   Wt 85.7 kg   SpO2 95%   BMI 33.48 kg/m  Gen:   Awake, no distress   Resp:  Normal effort  MSK:   Moves extremities without difficulty  Other:    Medical Decision Making  Medically screening exam initiated at 5:00 PM.  Appropriate orders placed.  DEZARIA METHOT was informed that the remainder of the evaluation will be completed by another provider, this initial triage assessment does not replace that evaluation, and the importance of remaining in the ED until their evaluation is complete.  Labs and imaging have been ordered.  Awaiting bed in the back at this time.   Lelon Perla, PA-C 11/03/23 1701

## 2023-11-03 NOTE — ED Notes (Signed)
 ED Provider at bedside.

## 2023-11-03 NOTE — ED Notes (Signed)
Pt ambulated to the bathroom in NAD

## 2023-11-03 NOTE — ED Notes (Signed)
 See triage notes. A/o. Nad. No resp distress or shob noted.

## 2023-11-03 NOTE — ED Provider Notes (Signed)
 [place] EMERGENCY DEPARTMENT AT [facility] Provider Note   CSN: [id number] Arrival date & time: 11/03/23  1638     History  Chief Complaint  Patient presents with   Cough    Sarah [name] is a 63 y.o. female.  Patient is a 63 year old female who presents to the emergency department the chief complaint of cough, congestion, rhinorrhea, shortness of breath, chest pain and dysuria.  She notes her symptoms have been ongoing for approximate the past 2 days.  Patient notes that she has been taking over-the-counter medications with only minimal improvement in her symptoms.  She does note that she has had some intermittent lightheadedness.  She denies any associated lower extremity pain or edema or associated hemoptysis.  She does admit to intermittent fevers.   Cough Associated symptoms: chest pain and shortness of breath        Home Medications Prior to Admission medications   Medication Sig Start Date End Date Taking? Authorizing Provider  aspirin EC 81 MG tablet Take 81 mg by mouth daily.    [provider]  Cholecalciferol (VITAMIN D3) 1.25 MG (50000 UT) CAPS Take 1 capsule by mouth once a week. 01/14/22   [provider]  co-enzyme Q-10 30 MG capsule Take 30 mg by mouth daily.    [provider]  Continuous Blood Gluc Sensor (DEXCOM G7 SENSOR) MISC 1 Device by Does not apply route as directed. 10/14/22   Shamleffer, Konrad Dolores, MD  cyclobenzaprine (FLEXERIL) 10 MG tablet Take 1 tablet (10 mg total) by mouth 3 (three) times daily as needed for muscle spasms. 04/23/23   Dione Booze, MD  diclofenac (VOLTAREN) 50 MG EC tablet Take 1 tablet (50 mg total) by mouth 2 (two) times daily. 06/15/23   Vickki Hearing, MD  diclofenac Sodium (VOLTAREN) 1 % GEL Apply 2 g topically 4 (four) times daily. 01/27/21   Vickki Hearing, MD  escitalopram (LEXAPRO) 5 MG tablet Take 10 mg by mouth at bedtime. 07/22/19   [provider]  gabapentin  (NEURONTIN) 100 MG capsule Take 100 mg by mouth 3 (three) times daily. 12/27/22   [provider]  GVOKE PFS 1 MG/0.2ML SOSY SMARTSIG:1 Milligram(s) SUB-Q 12/27/22   [provider]  ibuprofen (ADVIL) 800 MG tablet Take 800 mg by mouth every 6 (six) hours as needed for moderate pain.    [provider]  insulin aspart (NOVOLOG FLEXPEN) 100 UNIT/ML FlexPen Max daily 45 units 09/21/23   Shamleffer, Konrad Dolores, MD  insulin degludec (TRESIBA FLEXTOUCH) 100 UNIT/ML FlexTouch Pen Inject 55 Units into the skin daily. 09/21/23   Shamleffer, Konrad Dolores, MD  Insulin Infusion Pump Supplies (ILET INFUSION-INSET 23" ) MISC CHANGE EVERY TWO (2) DAYS Patient not taking: Reported on 09/21/2023 08/24/23   Shamleffer, Konrad Dolores, MD  Insulin Pen Needle (PEN NEEDLES) 32G X 4 MM MISC 1 Device by Other route in the morning, at noon, in the evening, and at bedtime. Use to inject insulin 09/21/23   Shamleffer, Konrad Dolores, MD  linaclotide [facility]) 145 MCG CAPS capsule Take 1 capsule (145 mcg total) by mouth daily before breakfast. 12/27/21   Letta Median, PA-C  LORazepam (ATIVAN) 0.5 MG tablet Take 0.5 mg by mouth 2 (two) times daily as needed for anxiety. 11/28/19   [provider]  losartan (COZAAR) 25 MG tablet Take 1 tablet (25 mg total) by mouth daily. 09/22/23   Shamleffer, Konrad Dolores, MD  methocarbamol (ROBAXIN) 500 MG tablet Take  500 mg by mouth at bedtime. 07/17/23   [provider]  omeprazole (PRILOSEC) 20 MG capsule Take 20 mg by mouth daily. 05/12/23   [provider]  ondansetron (ZOFRAN) 4 MG tablet Take 1 tablet (4 mg total) by mouth every 8 (eight) hours as needed for nausea or vomiting. 03/03/22   Terrilee Files, MD  oxyCODONE (ROXICODONE) 5 MG immediate release tablet Take 1 tablet (5 mg total) by mouth every 4 (four) hours as needed for severe pain. 04/23/23   Dione Booze, MD  simvastatin (ZOCOR) 20 MG tablet Take 20 mg by mouth See  admin instructions. Every Monday and Friday 10/19/19   [provider]  tirzepatide [facility]) 7.5 MG/0.5ML Pen Inject 7.5 mg into the skin once a week. 09/22/23   Shamleffer, Konrad Dolores, MD      Allergies    Sulfonamide derivatives and Latex    Review of Systems   Review of Systems  Respiratory:  Positive for cough and shortness of breath.   Cardiovascular:  Positive for chest pain.  All other systems reviewed and are negative.   Physical Exam Updated Vital Signs BP (!) 143/110   Pulse 83   Temp 98.6 F (37 C) (Oral)   Ht 5\' 3"  (1.6 m)   Wt 85.7 kg   SpO2 95%   BMI 33.48 kg/m  Physical Exam Vitals and nursing note reviewed.  Constitutional:      Appearance: Normal appearance.  HENT:     Head: Normocephalic and atraumatic.     Nose: Nose normal.     Mouth/Throat:     Mouth: Mucous membranes are moist.  Eyes:     Extraocular Movements: Extraocular movements intact.     Conjunctiva/sclera: Conjunctivae normal.     Pupils: Pupils are equal, round, and reactive to light.  Cardiovascular:     Rate and Rhythm: Normal rate and regular rhythm.     Pulses: Normal pulses.     Heart sounds: Normal heart sounds. No murmur heard. Pulmonary:     Effort: Pulmonary effort is normal. No respiratory distress.     Breath sounds: Normal breath sounds. No stridor. No wheezing, rhonchi or rales.  Abdominal:     General: Abdomen is flat. Bowel sounds are normal. There is no distension.     Palpations: Abdomen is soft.     Tenderness: There is no abdominal tenderness. There is no guarding.  Musculoskeletal:        General: Normal range of motion.     Cervical back: Normal range of motion and neck supple.     Right lower leg: No edema.     Left lower leg: No edema.  Skin:    General: Skin is warm and dry.  Neurological:     General: No focal deficit present.     Mental Status: She is alert and oriented to person, place, and time. Mental status is at baseline.     Cranial  Nerves: No cranial nerve deficit.     Sensory: No sensory deficit.     Motor: No weakness.     Coordination: Coordination normal.     Gait: Gait normal.  Psychiatric:        Mood and Affect: Mood normal.        Behavior: Behavior normal.        Thought Content: Thought content normal.        Judgment: Judgment normal.     ED Results / Procedures / Treatments  Labs (all labs ordered are listed, but only abnormal results are displayed) Labs Reviewed  RESP PANEL BY RT-PCR (RSV, FLU A&B, COVID)  [id number]  COMPREHENSIVE METABOLIC PANEL  CBC WITH DIFFERENTIAL/PLATELET  URINALYSIS, ROUTINE W REFLEX MICROSCOPIC  TROPONIN I (HIGH SENSITIVITY)    EKG None  Radiology No results found.  Procedures Procedures    Medications Ordered in ED Medications - No data to display  ED Course/ Medical Decision Making/ A&P Clinical Course as of 11/03/23 1912  Fri Nov 03, 2023  1822 EKG consistent with previous [CR]    Clinical Course User Index [CR] Lelon Perla, PA-C                                 Medical Decision Making Patient does remain stable at this time.  Thus far all workup has been unremarkable in the emergency department.  EKG is consistent with previous and troponin is within normal limits.  Do not suspect ACS at this time.  Patient is otherwise low risk for pulmonary embolus and do not suspect this etiology.  Patient has had stable vital signs with no indication for sepsis and no associated hypoxia.  Chest x-ray demonstrated no indication for pneumonia.  Patient has no indication for pneumothorax, hemothorax.  Suspect that she is suffering from an acute viral respiratory infection at this point.  Will sign patient out to oncoming provider Burgess Amor PA at 1910 on 11/03/23  Amount and/or Complexity of Data Reviewed Labs: ordered. Radiology: ordered.  Risk Prescription drug management.           Final Clinical Impression(s) / ED Diagnoses Final diagnoses:   None    Rx / DC Orders ED Discharge Orders     None         Kathlen Mody 11/03/23 1913    Eber Hong, MD 11/03/23 [phone]

## 2023-11-06 LAB — URINE CULTURE
Culture: >=100000 — AB
Special Requests: NORMAL

## 2023-11-07 ENCOUNTER — Telehealth (HOSPITAL_BASED_OUTPATIENT_CLINIC_OR_DEPARTMENT_OTHER): Payer: Self-pay | Admitting: *Deleted

## 2023-11-07 NOTE — Telephone Encounter (Signed)
 Post ED Visit - Positive Culture Follow-up  Culture report reviewed by antimicrobial stewardship pharmacist: [facility] [x]  Ivery Quale Pharm.D. []  Celedonio Miyamoto, Pharm.D., BCPS AQ-ID []  Garvin Fila, Pharm.D., BCPS []  Georgina Pillion, Pharm.D., BCPS []  [place], [address].D., BCPS, AAHIVP []  Estella Husk, Pharm.D., BCPS, AAHIVP []  Lysle Pearl, PharmD, BCPS []  Phillips Climes, PharmD, BCPS []  Agapito Games, PharmD, BCPS []  Verlan Friends, PharmD []  Mervyn Gay, PharmD, BCPS []  Vinnie Level, PharmD  [facility] []  Len Childs, PharmD []  Greer Pickerel, PharmD []  Adalberto Cole, PharmD []  Perlie Gold, Rph []  Lonell Face) Jean Rosenthal, PharmD []  Earl Many, PharmD []  Junita Push, PharmD []  Dorna Leitz, PharmD []  Terrilee Files, PharmD []  Lynann Beaver, PharmD []  Keturah Barre, PharmD []  Loralee Pacas, PharmD []  Bernadene Person, PharmD   Positive urine culture Treated with no antibiotics, no urinary symptoms noted, other than occasional dysuria. no further patient follow-up is required at this time.  Bing Quarry 11/07/2023, 9:34 AM

## 2023-11-26 ENCOUNTER — Other Ambulatory Visit: Payer: Self-pay | Admitting: Internal Medicine

## 2023-12-18 ENCOUNTER — Encounter: Payer: Self-pay | Admitting: Internal Medicine

## 2023-12-18 ENCOUNTER — Ambulatory Visit (INDEPENDENT_AMBULATORY_CARE_PROVIDER_SITE_OTHER): Payer: No Typology Code available for payment source | Admitting: Internal Medicine

## 2023-12-18 VITALS — BP 110/70 | HR 75 | Ht 63.0 in | Wt 181.0 lb

## 2023-12-18 DIAGNOSIS — E1129 Type 2 diabetes mellitus with other diabetic kidney complication: Secondary | ICD-10-CM | POA: Diagnosis not present

## 2023-12-18 DIAGNOSIS — G63 Polyneuropathy in diseases classified elsewhere: Secondary | ICD-10-CM | POA: Insufficient documentation

## 2023-12-18 DIAGNOSIS — E1142 Type 2 diabetes mellitus with diabetic polyneuropathy: Secondary | ICD-10-CM

## 2023-12-18 DIAGNOSIS — Z794 Long term (current) use of insulin: Secondary | ICD-10-CM

## 2023-12-18 DIAGNOSIS — R8290 Unspecified abnormal findings in urine: Secondary | ICD-10-CM | POA: Insufficient documentation

## 2023-12-18 DIAGNOSIS — R809 Proteinuria, unspecified: Secondary | ICD-10-CM

## 2023-12-18 DIAGNOSIS — E1165 Type 2 diabetes mellitus with hyperglycemia: Secondary | ICD-10-CM

## 2023-12-18 LAB — POCT GLYCOSYLATED HEMOGLOBIN (HGB A1C): Hemoglobin A1C: 6.7 % — AB (ref 4.0–5.6)

## 2023-12-18 LAB — POCT GLUCOSE (DEVICE FOR HOME USE): POC Glucose: 91 mg/dL (ref 70–99)

## 2023-12-18 MED ORDER — GABAPENTIN 300 MG PO CAPS: 300.0000 mg | ORAL_CAPSULE | Freq: Every day | ORAL | 1 refills | Status: DC

## 2023-12-18 MED ORDER — LISINOPRIL 10 MG PO TABS: 10.0000 mg | ORAL_TABLET | Freq: Every day | ORAL | 3 refills | Status: AC

## 2023-12-18 MED ORDER — TRESIBA FLEXTOUCH 100 UNIT/ML ~~LOC~~ SOPN: 50.0000 [IU] | PEN_INJECTOR | Freq: Every day | SUBCUTANEOUS | 3 refills | Status: DC

## 2023-12-18 NOTE — Patient Instructions (Addendum)
 Continue Mounjaro  7.5 mg weekly Decrease Tresiba  50 units once daily Continue NovoLog  15 units with each meal (breakfast, lunch, dinner) Novolog  correctional insulin : ADD extra units on insulin  to your meal-time Novolog  dose if your blood sugars are higher than 150. Use the scale below to help guide you:   Blood sugar before meal Number of units to inject  Less than 150 0 unit  151 -  170 1 units  171 -  190 2 units  191 -  210 3 units  211 -  230 4 units  231 -  250 5 units  251 -  270 6 units  271 -  290 7 units  291 -  310 8 units  311 - 330 9 units    Increase gabapentin to 300 mg, 1 tablet at bedtime Switch losartan  to lisinopril 10 mg daily   HOW TO TREAT LOW BLOOD SUGARS (Blood sugar LESS THAN 70 MG/DL) Please follow the RULE OF 15 for the treatment of hypoglycemia treatment (when your (blood sugars are less than 70 mg/dL)   STEP 1: Take 15 grams of carbohydrates when your blood sugar is low, which includes:  3-4 GLUCOSE TABS  OR 3-4 OZ OF JUICE OR REGULAR SODA OR ONE TUBE OF GLUCOSE GEL    STEP 2: RECHECK blood sugar in 15 MINUTES STEP 3: If your blood sugar is still low at the 15 minute recheck --> then, go back to STEP 1 and treat AGAIN with another 15 grams of carbohydrates.

## 2023-12-18 NOTE — Progress Notes (Unsigned)
 Name: Sarah [name]  Age/ Sex: 63 y.o., female   MRN/ DOB: [id number], 07-04-1961     PCP: Omie Bickers, MD   Reason for Endocrinology Evaluation: Type 2 Diabetes Mellitus  Initial Endocrine Consultative Visit: 04/06/2021    PATIENT IDENTIFIER: Sarah [name] is a 63 y.o. female with a past medical history of DM, HTN, acute pyelonephritis . The patient has followed with Endocrinology clinic since 04/06/2021 for consultative assistance with management of her diabetes.  DIABETIC HISTORY:  Sarah [name] was diagnosed with DM 2002, and started insulin  therapy in 2012. Her hemoglobin A1c has ranged from 9.4% in 2021, peaking at 12.4% in 2022.    Tandem was started in 2022   She was followed by Dr. Washington Hacker from August 2022 until October 2022  Ozempic  was cost prohibitive   She was denied the tandem pump 2024, and was eventually started on the bionic insulin  pump through a trial directly through the company 2025   I switched amlodipine to losartan  08/2023, due to elevated MA/CR ratio   SUBJECTIVE:   During the last visit (09/21/2023): A1c 11.6%   Today (12/18/2023): Sarah [name] is here for a follow up on diabetes management.  She typically checks her blood sugars multiple daily, through the Dexcom but has been without it for a month due to financial constraints, she has been using fingersticks  She has been without Mounjaro  for a month due to financial constraints Her father is under hospice, and she has moved him with her and has been stressed  Denies nausea or vomiting  She gets nausea with first day of Mounjaro  intake associated with headaches  Has rare diarrhea   She has noted strong urine smell, has occasional dysuria , no fever  Rare  dizziness with hypoglycemia  She has tingling and numbness of the feet   HOME DIABETES REGIMEN:  Mounjaro  7.5 mg weekly Tresiba  55 units daily Novolog  10 units with each meal- takes 15 units  CF: NovoLog  (BG -130/20) TIDQAC Losartan  25  mg daily     Statin: yes ACE-I/ARB: no Prior Diabetic Education: yes      DIABETIC COMPLICATIONS: Microvascular complications:  Neuropathy  Denies: CKD , retinopathy Last Eye Exam: Completed 2022  Macrovascular complications:   Denies: CAD, CVA, PVD   HISTORY:  Past Medical History:  Past Medical History:  Diagnosis Date   Anxiety    Diabetes mellitus    type II-pt uses insulin  pump   Diverticulosis    GERD (gastroesophageal reflux disease)    Hypercholesteremia    Hypertension    Insulin  pump in place    DM type II   Past Surgical History:  Past Surgical History:  Procedure Laterality Date   ABDOMINAL HYSTERECTOMY     complete   CARDIAC CATHETERIZATION  2011   COLONOSCOPY  01/04/2010   left sided transverse diverticula/two diminutive rectal polyps   COLONOSCOPY  03/28/2012   Surgeon: Suzette Espy, MD; riable anal canal with suspected trivial rectal bleeding from this location, colonic diverticulosis, s/p segmental resection, single diminutive polyp in the descending colon resected.  Pathology was benign.  Recommended 10-year repeat.   COLONOSCOPY N/A 09/11/2018   Surgeon: Alanda Allegra, MD;   moderate diverticulosis in the descending colon, otherwise normal exam.  Recommended 10-year repeat.   FOOT SURGERY     right-tendon repair   laparoscopic sigmoid colectomy  02/01/2010   Dr Jenkins-diverticulitis   TRIGGER FINGER RELEASE Left 03/2015   TRIGGER FINGER RELEASE Right 02/25/2016  Procedure: RIGHT LONG TRIGGER FINGER RELEASE;  Surgeon: Darrin Emerald, MD;  Location: [facility];  Service: Orthopedics;  Laterality: Right;   UMBILICAL HERNIA REPAIR N/A 02/05/2020   Procedure: UMBILICAL HERNIORRHAPY WITH MESH;  Surgeon: Alanda Allegra, MD;  Location: [facility];  Service: General;  Laterality: N/A;   Social History:  reports that she quit smoking about 17 years ago. Her smoking use included cigarettes. She started smoking about 27 years ago. She has a 10  pack-year smoking history. She has never used smokeless tobacco. She reports that she does not currently use alcohol. She reports that she does not use drugs. Family History:  Family History  Problem Relation Age of Onset   Colon polyps Mother        in her 31s   Diabetes Mother    Hypertension Mother    Heart attack Father    Heart murmur Sister    Hypertension Brother    Colon cancer Maternal Grandfather        7   Diabetes Son    Liver disease Neg Hx      HOME MEDICATIONS: Allergies as of 12/18/2023       Reactions   Sulfonamide Derivatives Itching   Latex Itching, Rash, Other (See Comments)   Powdered only        Medication List        Accurate as of December 18, 2023 12:17 PM. If you have any questions, ask your nurse or doctor.          albuterol  108 (90 Base) MCG/ACT inhaler Commonly known as: VENTOLIN  HFA SMARTSIG:2 Puff(s) By Mouth Every 4-6 Hours PRN   aspirin  EC 81 MG tablet Take 81 mg by mouth daily.   benzonatate  100 MG capsule Commonly known as: TESSALON  Take 2 capsules (200 mg total) by mouth 3 (three) times daily as needed.   co-enzyme Q-10 30 MG capsule Take 30 mg by mouth daily.   cyclobenzaprine  10 MG tablet Commonly known as: FLEXERIL  Take 1 tablet (10 mg total) by mouth 3 (three) times daily as needed for muscle spasms.   Dexcom G7 Sensor Misc 1 Device by Does not apply route as directed.   diclofenac  50 MG EC tablet Commonly known as: VOLTAREN  Take 1 tablet (50 mg total) by mouth 2 (two) times daily.   diclofenac  Sodium 1 % Gel Commonly known as: Voltaren  Apply 2 g topically 4 (four) times daily.   escitalopram 5 MG tablet Commonly known as: LEXAPRO Take 10 mg by mouth at bedtime.   gabapentin 100 MG capsule Commonly known as: NEURONTIN Take 100 mg by mouth 3 (three) times daily.   Gvoke PFS 1 MG/0.2ML Sosy Generic drug: Glucagon SMARTSIG:1 Milligram(s) SUB-Q   ibuprofen  800 MG tablet Commonly known as: ADVIL  Take  800 mg by mouth every 6 (six) hours as needed for moderate pain.   Ilet infusion-Inset 23" 6mm Misc CHANGE EVERY TWO (2) DAYS   linaclotide  145 MCG Caps capsule Commonly known as: Linzess  Take 1 capsule (145 mcg total) by mouth daily before breakfast.   LORazepam  0.5 MG tablet Commonly known as: ATIVAN  Take 0.5 mg by mouth 2 (two) times daily as needed for anxiety.   losartan  25 MG tablet Commonly known as: COZAAR  Take 1 tablet (25 mg total) by mouth daily.   methocarbamol  500 MG tablet Commonly known as: ROBAXIN  Take 500 mg by mouth at bedtime.   NovoLOG  FlexPen 100 UNIT/ML FlexPen Generic drug: insulin  aspart Max daily 45 units  NovoLOG  100 UNIT/ML injection Generic drug: insulin  aspart USE 100 UNITS INSIDE INSULIN  PUMP AS DIRECTED DAILY.   omeprazole 20 MG capsule Commonly known as: PRILOSEC Take 20 mg by mouth daily.   ondansetron  4 MG tablet Commonly known as: ZOFRAN  Take 1 tablet (4 mg total) by mouth every 8 (eight) hours as needed for nausea or vomiting.   oxyCODONE  5 MG immediate release tablet Commonly known as: Roxicodone  Take 1 tablet (5 mg total) by mouth every 4 (four) hours as needed for severe pain.   Pen Needles 32G X 4 MM Misc 1 Device by Other route in the morning, at noon, in the evening, and at bedtime. Use to inject insulin    simvastatin  20 MG tablet Commonly known as: ZOCOR  Take 20 mg by mouth See admin instructions. Every Monday and Friday   tirzepatide  7.5 MG/0.5ML Pen Commonly known as: MOUNJARO  Inject 7.5 mg into the skin once a week.   Tresiba  FlexTouch 100 UNIT/ML FlexTouch Pen Generic drug: insulin  degludec Inject 55 Units into the skin daily.   Vitamin D3 1.25 MG (50000 UT) Caps Take 1 capsule by mouth once a week.         OBJECTIVE:   Vital Signs: BP 110/70 (BP Location: Left Arm, Patient Position: Sitting, Cuff Size: Normal)   Pulse 75   Ht 5\' 3"  (1.6 m)   Wt 181 lb (82.1 kg)   SpO2 99%   BMI 32.06 kg/m   Wt  Readings from Last 3 Encounters:  12/18/23 181 lb (82.1 kg)  11/03/23 189 lb (85.7 kg)  09/21/23 189 lb (85.7 kg)     Exam: General: Pt appears well and is in NAD  Lungs: Clear with good BS bilat   Heart: RRR , + systolic murmur  Extremities: No pretibial edema.   Neuro: MS is good with appropriate affect, pt is alert and Ox3    DM foot exam: 03/21/2023      The skin of the feet is intact without sores or ulcerations. The pedal pulses are 2+ on right and 2+ on left. The sensation is decreased  to a screening 5.07, 10 gram monofilament bilaterally at the heels     DATA REVIEWED:  Lab Results  Component Value Date   HGBA1C 6.7 (A) 12/18/2023   HGBA1C 11.6 (A) 09/21/2023   HGBA1C 9.6 (A) 03/21/2023    Latest Reference Range & Units 06/18/23 08:49  Sodium 135 - 145 mmol/L 131 (L)  Potassium 3.5 - 5.1 mmol/L 4.1  Chloride 98 - 111 mmol/L 100  CO2 22 - 32 mmol/L 23  Glucose 70 - 99 mg/dL 161 (H)  BUN 8 - 23 mg/dL 18  Creatinine 0.96 - 0.45 mg/dL 4.09  Calcium  8.9 - 10.3 mg/dL 8.4 (L)  Anion gap 5 - 15  8  Magnesium  1.7 - 2.4 mg/dL 1.8  Alkaline Phosphatase 38 - 126 U/L 89  Albumin 3.5 - 5.0 g/dL 3.1 (L)  AST 15 - 41 U/L 30  ALT 0 - 44 U/L 34  Total Protein 6.5 - 8.1 g/dL 7.1  Total Bilirubin 0.3 - 1.2 mg/dL 0.4  GFR, Estimated >81 mL/min >60       ASSESSMENT / PLAN / RECOMMENDATIONS:   1) Type 2 Diabetes Mellitus, Optimally controlled, With Neuropathic complications and microalbuminuria - Most recent A1c of 6.7%. Goal A1c < 7.0 %.    -I have placed the patient on optimizing glucose control -She has certainly done better with multiple daily injections rather than insulin  pumps.  With the insulin  pump she was having financial hardship with intermittent use -She has been without Mounjaro  and Dexcom for a month -Not a candidate for SGLT2 inhibitors due to history of pyelonephritis, she also developed cramps on Jardiance - I will decrease basal insulin  due to a  tight BG of 71 Mg/DL this a.m. - She has been taking 15 units of NovoLog  with each meal, has not needed to use correction scale, I will not change that even though it is higher than previously prescribed  MEDICATIONS: Continue  Mounjaro  7.5 mg weekly Decrease Tresiba  50 units daily Continue NovoLog  15 units with each meal Continue CF: Novolog  (BG-130/20) TIDQAC     EDUCATION / INSTRUCTIONS: BG monitoring instructions: Patient is instructed to check her blood sugars 3 times a day, before each meal . Call [place] [facility] if: BG persistently < 70  I reviewed the Rule of 15 for the treatment of hypoglycemia in detail with the patient. Literature supplied.    2) Diabetic complications:  Eye: Does not have known diabetic retinopathy.  Neuro/ Feet: Does  have known diabetic peripheral neuropathy .  Renal: Patient does not have known baseline CKD. She   is  on an ACEI/ARB at present.    3) Microalbuminuria :  -MA/CR ratio elevated -We emphasized the importance of optimizing glucose control -I have switched amlodipine to losartan , but she developed a strong urine odor that she attributed to losartan , will change to lisinopril  Medication Stop losartan  25 mg daily Start lisinopril 10 mg daily  4.  Abnormal urine order  - Her urine has a pretty strong smell to it -UA*** - Patient attributes this smell to losartan , will change to lisinopril   5) Peripheral neuropathy  - She is on gabapentin 100 mg -I will increase as current dose is not controlling his symptoms -Emphasized the importance of optimizing glucose control - Cautioned against drowsiness and dizziness  Medication Stop gabapentin 100 mg Start gabapentin 300 mg at bedtime  F/U in 4 months     Signed electronically by: Natale Bail, MD  [facility]  [facility] [address] [place] [place], [place] [address] Phone: [phone] FAX:  [phone]   CC: Omie Bickers, MD [address] [place] [address] Phone: [phone]  Fax: [phone]  Return to Endocrinology clinic as below: No future appointments.

## 2023-12-19 ENCOUNTER — Encounter: Payer: Self-pay | Admitting: Internal Medicine

## 2023-12-19 LAB — URINALYSIS W MICROSCOPIC + REFLEX CULTURE
Bilirubin Urine: NEGATIVE
Glucose, UA: NEGATIVE
Hgb urine dipstick: NEGATIVE
Hyaline Cast: NONE SEEN /LPF
Ketones, ur: NEGATIVE
Leukocyte Esterase: NEGATIVE
Nitrites, Initial: NEGATIVE
Protein, ur: NEGATIVE
RBC / HPF: NONE SEEN /HPF (ref 0–2)
Specific Gravity, Urine: 1.007 (ref 1.001–1.035)
Squamous Epithelial / HPF: NONE SEEN /HPF (ref <=5)
WBC, UA: NONE SEEN /HPF (ref 0–5)
pH: 6.5 (ref 5.0–8.0)

## 2023-12-19 LAB — NO CULTURE INDICATED

## 2024-01-08 ENCOUNTER — Telehealth: Payer: Self-pay

## 2024-01-08 NOTE — Telephone Encounter (Signed)
 Patient would like for Victorino Dike to give her a call

## 2024-01-08 NOTE — Telephone Encounter (Signed)
 Sarah [name] is on mounjaro  and A1c down to 6, but she is having itching and odor and got in to see PCP and had swabs done, she may want appointment depending on results. Just let me know.

## 2024-02-08 ENCOUNTER — Other Ambulatory Visit (HOSPITAL_COMMUNITY): Payer: Self-pay

## 2024-02-08 ENCOUNTER — Telehealth: Payer: Self-pay | Admitting: Pharmacy Technician

## 2024-02-08 NOTE — Telephone Encounter (Signed)
 Pharmacy Patient Advocate Encounter  Received notification from [facility] that Prior Authorization for Mounjaro  7.5MG /0.5ML auto-injectors has been APPROVED from 02/08/2024 to 02/07/2025    PA #/Case ID/Reference #: [id number]

## 2024-02-08 NOTE — Telephone Encounter (Signed)
 Pharmacy Patient Advocate Encounter   Received notification from CoverMyMeds that prior authorization for Mounjaro  7.5MG /0.5ML auto-injectors is required/requested.   Insurance verification completed.   The patient is insured through [facility] .   Per test claim: PA required; PA submitted to above mentioned insurance via CoverMyMeds Key/confirmation #/EOC [id number] Status is pending

## 2024-02-13 ENCOUNTER — Ambulatory Visit: Admitting: Adult Health

## 2024-02-15 ENCOUNTER — Encounter: Payer: Self-pay | Admitting: Adult Health

## 2024-02-15 ENCOUNTER — Ambulatory Visit: Admitting: Adult Health

## 2024-02-15 ENCOUNTER — Other Ambulatory Visit (HOSPITAL_COMMUNITY)
Admission: RE | Admit: 2024-02-15 | Discharge: 2024-02-15 | Disposition: A | Discharge status: Home / Self Care | Source: Ambulatory Visit | Attending: Adult Health | Admitting: Adult Health

## 2024-02-15 VITALS — BP 164/84 | HR 60 | Ht 63.0 in | Wt 187.0 lb

## 2024-02-15 DIAGNOSIS — N898 Other specified noninflammatory disorders of vagina: Secondary | ICD-10-CM | POA: Insufficient documentation

## 2024-02-15 DIAGNOSIS — Z90710 Acquired absence of both cervix and uterus: Secondary | ICD-10-CM

## 2024-02-15 DIAGNOSIS — I10 Essential (primary) hypertension: Secondary | ICD-10-CM | POA: Diagnosis not present

## 2024-02-15 DIAGNOSIS — Z87440 Personal history of urinary (tract) infections: Secondary | ICD-10-CM

## 2024-02-15 LAB — POCT URINALYSIS DIPSTICK: Protein, UA: POSITIVE — AB

## 2024-02-15 LAB — CERVICOVAGINAL ANCILLARY ONLY
Bacterial Vaginitis (gardnerella): NEGATIVE
Comment: NEGATIVE

## 2024-02-15 NOTE — Progress Notes (Signed)
  Subjective:     Patient ID: Sarah [name], female   DOB: 02-13-1961, 63 y.o.   MRN: [id number]  HPI Sarah [name] is a 63 year old black female,married, sp hysterectomy in complaining of recurrent yeast and recent UTI, that was E coli, she was treated. She has some itching now and had brown discharge about 3 weeks ago. A1c is down to 6 and has lost about 25 lbs on mounjaro .  Did not take BP meds today.   PCP is Dr Shona  Review of Systems +vaginal itching +vaginal discharge about 3 weeks ago that was brown Sex is not often Memory not as good Reviewed past medical,surgical, social and family history. Reviewed medications and allergies.     Objective:   Physical Exam BP (!) 164/84 (BP Location: Left Arm, Patient Position: Sitting, Cuff Size: Normal)   Pulse 60   Ht 5' 3 (1.6 m)   Wt 187 lb (84.8 kg)   BMI 33.13 kg/m  urine dipstick +protein Skin warm and dry.  Lungs: clear to ausculation bilaterally. Cardiovascular: regular rate and rhythm.  Pelvic: external genitalia is normal in appearance no lesions, vagina: scant white discharge without odor, no lesions noted,urethra has no lesions or masses noted, cervix and uterus are absent,CV swab obtained,adnexa: no masses or tenderness noted. Bladder is non tender and no masses felt.      [place] - 02/15/24 1119       Pregnancy Intention Screening   Does the patient want to become pregnant in the next year? N/A    Does the patient's partner want to become pregnant in the next year? N/A    Would the patient like to discuss contraceptive options today? N/A      Contraception Wrap Up   Current Method Female Sterilization   hyst   End Method Female Sterilization   hyst   Contraception Counseling Provided No         Examination chaperoned by Clarita Salt LPN Assessment:     .1. Vaginal itching (Primary) CV swab sent for BV and yeast  - Cervicovaginal ancillary only( [place])  2. Vaginal discharge CV swab sent for BV and yeast  -  Cervicovaginal ancillary only( [place])  3. History of UTI UA C&S sent  - Urine Culture - Urinalysis, Routine w reflex microscopic - POCT urinalysis dipstick  4. S/P hysterectomy  5. Essential hypertension Take BP meds and follow up with PCP    Plan:     Follow up TBD

## 2024-02-16 ENCOUNTER — Ambulatory Visit: Payer: Self-pay | Admitting: Adult Health

## 2024-02-16 LAB — MICROSCOPIC EXAMINATION
Bacteria, UA: NONE SEEN
Casts: NONE SEEN /LPF

## 2024-02-16 LAB — CERVICOVAGINAL ANCILLARY ONLY
Candida Glabrata: NEGATIVE
Candida Vaginitis: NEGATIVE
Comment: NEGATIVE
Comment: NEGATIVE

## 2024-02-16 LAB — URINALYSIS, ROUTINE W REFLEX MICROSCOPIC
Bilirubin, UA: NEGATIVE
Glucose, UA: NEGATIVE
Ketones, UA: NEGATIVE
Leukocytes,UA: NEGATIVE
Nitrite, UA: NEGATIVE
RBC, UA: NEGATIVE
Specific Gravity, UA: 1.018 (ref 1.005–1.030)
Urobilinogen, Ur: 0.2 mg/dL (ref 0.2–1.0)
pH, UA: 6.5 (ref 5.0–7.5)

## 2024-02-17 LAB — URINE CULTURE

## 2024-02-29 ENCOUNTER — Other Ambulatory Visit: Payer: Self-pay

## 2024-02-29 ENCOUNTER — Emergency Department (HOSPITAL_COMMUNITY)
Admission: EM | Admit: 2024-02-29 | Discharge: 2024-02-29 | Disposition: A | Discharge status: Home / Self Care | Attending: Emergency Medicine | Admitting: Emergency Medicine

## 2024-02-29 ENCOUNTER — Encounter (HOSPITAL_COMMUNITY): Payer: Self-pay

## 2024-02-29 DIAGNOSIS — Z7982 Long term (current) use of aspirin: Secondary | ICD-10-CM | POA: Diagnosis not present

## 2024-02-29 DIAGNOSIS — R1030 Lower abdominal pain, unspecified: Secondary | ICD-10-CM | POA: Diagnosis present

## 2024-02-29 DIAGNOSIS — N3000 Acute cystitis without hematuria: Secondary | ICD-10-CM | POA: Diagnosis not present

## 2024-02-29 DIAGNOSIS — Z91040 Latex allergy status: Secondary | ICD-10-CM | POA: Diagnosis not present

## 2024-02-29 LAB — URINALYSIS, ROUTINE W REFLEX MICROSCOPIC
Bilirubin Urine: NEGATIVE
Glucose, UA: NEGATIVE mg/dL
Hgb urine dipstick: NEGATIVE
Ketones, ur: NEGATIVE mg/dL
Nitrite: POSITIVE — AB
Protein, ur: 30 mg/dL — AB
Specific Gravity, Urine: 1.024 (ref 1.005–1.030)
pH: 5 (ref 5.0–8.0)

## 2024-02-29 LAB — CBC WITH DIFFERENTIAL/PLATELET
Abs Immature Granulocytes: 0.02 K/uL (ref 0.00–0.07)
Basophils Absolute: 0 K/uL (ref 0.0–0.1)
Basophils Relative: 1 %
Eosinophils Absolute: 0.2 K/uL (ref 0.0–0.5)
Eosinophils Relative: 2 %
HCT: 39.8 % (ref 36.0–46.0)
Hemoglobin: 14.2 g/dL (ref 12.0–15.0)
Immature Granulocytes: 0 %
Lymphocytes Relative: 40 %
Lymphs Abs: 2.8 K/uL (ref 0.7–4.0)
MCH: 28.9 pg (ref 26.0–34.0)
MCHC: 35.7 g/dL (ref 30.0–36.0)
MCV: 81.1 fL (ref 80.0–100.0)
Monocytes Absolute: 0.7 K/uL (ref 0.1–1.0)
Monocytes Relative: 10 %
Neutro Abs: 3.4 K/uL (ref 1.7–7.7)
Neutrophils Relative %: 47 %
Platelets: 329 K/uL (ref 150–400)
RBC: 4.91 MIL/uL (ref 3.87–5.11)
RDW: 14.9 % (ref 11.5–15.5)
WBC: 7.2 K/uL (ref 4.0–10.5)
nRBC: 0 % (ref 0.0–0.2)

## 2024-02-29 LAB — COMPREHENSIVE METABOLIC PANEL WITH GFR
ALT: 50 U/L — ABNORMAL HIGH (ref 0–44)
AST: 34 U/L (ref 15–41)
Albumin: 3.4 g/dL — ABNORMAL LOW (ref 3.5–5.0)
Alkaline Phosphatase: 71 U/L (ref 38–126)
Anion gap: 13 (ref 5–15)
BUN: 17 mg/dL (ref 8–23)
CO2: 22 mmol/L (ref 22–32)
Calcium: 9.1 mg/dL (ref 8.9–10.3)
Chloride: 105 mmol/L (ref 98–111)
Creatinine, Ser: 0.75 mg/dL (ref 0.44–1.00)
GFR, Estimated: >60 mL/min (ref >60)
Glucose, Bld: 127 mg/dL — ABNORMAL HIGH (ref 70–99)
Potassium: 4 mmol/L (ref 3.5–5.1)
Sodium: 140 mmol/L (ref 135–145)
Total Bilirubin: 0.4 mg/dL (ref 0.0–1.2)
Total Protein: 7.8 g/dL (ref 6.5–8.1)

## 2024-02-29 LAB — LIPASE, BLOOD: Lipase: 36 U/L (ref 11–51)

## 2024-02-29 MED ORDER — CEPHALEXIN 500 MG PO CAPS
500.0000 mg | ORAL_CAPSULE | Freq: Once | ORAL | Status: AC
  Administered 2024-02-29: 500 mg via ORAL
  Filled 2024-02-29: qty 1

## 2024-02-29 MED ORDER — ACETAMINOPHEN 325 MG PO TABS
650.0000 mg | ORAL_TABLET | Freq: Once | ORAL | Status: AC
  Administered 2024-02-29: 650 mg via ORAL
  Filled 2024-02-29: qty 2

## 2024-02-29 MED ORDER — CEPHALEXIN 500 MG PO CAPS: 500.0000 mg | ORAL_CAPSULE | Freq: Three times a day (TID) | ORAL | 0 refills | Status: AC

## 2024-02-29 NOTE — ED Provider Notes (Signed)
 [facility] EMERGENCY DEPARTMENT AT [facility] Provider Note   CSN: [id number] Arrival date & time: 02/29/24  1611     Patient presents with: Abdominal Pain   Sarah [name] is a 63 y.o. female.    Abdominal Pain  This patient is a 63 year old female recently prescribed fosfomycin for a UTI about 3 weeks ago, her symptoms which she was told was from E. coli based on her family doctor's report went away completely but came back over the last 2 days and she now has some lower abdominal discomfort, foul-smelling urine but no fevers chills nausea or vomiting, she does have a headache.  No back pain.    Prior to Admission medications   Medication Sig Start Date End Date Taking? Authorizing Provider  cephALEXin  (KEFLEX ) 500 MG capsule Take 1 capsule (500 mg total) by mouth 3 (three) times daily for 7 days. 02/29/24 03/07/24 Yes Cleotilde Rogue, MD  fosfomycin (MONUROL) 3 g PACK Take 3 g by mouth once.   Yes [provider]  albuterol  (VENTOLIN  HFA) 108 (90 Base) MCG/ACT inhaler SMARTSIG:2 Puff(s) By Mouth Every 4-6 Hours PRN 11/07/23   [provider]  aspirin  EC 81 MG tablet Take 81 mg by mouth daily.    [provider]  benzonatate  (TESSALON ) 100 MG capsule Take 2 capsules (200 mg total) by mouth 3 (three) times daily as needed. 11/03/23   Idol, Julie, PA-C  Cholecalciferol (VITAMIN D3) 1.25 MG (50000 UT) CAPS Take 1 capsule by mouth once a week. 01/14/22   [provider]  co-enzyme Q-10 30 MG capsule Take 30 mg by mouth daily.    [provider]  Continuous Blood Gluc Sensor (DEXCOM G7 SENSOR) MISC 1 Device by Does not apply route as directed. 10/14/22   Shamleffer, Ibtehal Jaralla, MD  cyclobenzaprine  (FLEXERIL ) 10 MG tablet Take 1 tablet (10 mg total) by mouth 3 (three) times daily as needed for muscle spasms. 04/23/23   Raford Lenis, MD  diclofenac  (VOLTAREN ) 50 MG EC tablet Take 1 tablet (50 mg total) by mouth 2 (two) times daily.  06/15/23   Margrette Taft BRAVO, MD  diclofenac  Sodium (VOLTAREN ) 1 % GEL Apply 2 g topically 4 (four) times daily. 01/27/21   Margrette Taft BRAVO, MD  escitalopram (LEXAPRO) 5 MG tablet Take 10 mg by mouth at bedtime. 07/22/19   [provider]  gabapentin  (NEURONTIN ) 300 MG capsule Take 1 capsule (300 mg total) by mouth at bedtime. 12/18/23   Shamleffer, Ibtehal Jaralla, MD  GVOKE PFS 1 MG/0.2ML SOSY SMARTSIG:1 Milligram(s) SUB-Q 12/27/22   [provider]  ibuprofen  (ADVIL ) 800 MG tablet Take 800 mg by mouth every 6 (six) hours as needed for moderate pain.    [provider]  insulin  aspart (NOVOLOG  FLEXPEN) 100 UNIT/ML FlexPen Max daily 45 units 09/21/23   Shamleffer, Ibtehal Jaralla, MD  insulin  degludec (TRESIBA  FLEXTOUCH) 100 UNIT/ML FlexTouch Pen Inject 50 Units into the skin daily. 12/18/23   Shamleffer, Donell Cardinal, MD  Insulin  Pen Needle (PEN NEEDLES) 32G X 4 MM MISC 1 Device by Other route in the morning, at noon, in the evening, and at bedtime. Use to inject insulin  09/21/23   Shamleffer, Donell Cardinal, MD  linaclotide  (LINZESS ) 145 MCG CAPS capsule Take 1 capsule (145 mcg total) by mouth daily before breakfast. 12/27/21   Rudy Josette RAMAN, PA-C  lisinopril  (ZESTRIL ) 10 MG tablet Take 1 tablet (10 mg total) by mouth daily. 12/18/23   Shamleffer, Ibtehal Jaralla, MD  LORazepam  (ATIVAN ) 0.5 MG tablet Take 0.5 mg by mouth 2 (two) times daily as needed for anxiety. 11/28/19   [provider]  omeprazole (PRILOSEC) 20 MG capsule Take 20 mg by mouth daily. 05/12/23   [provider]  tirzepatide  (MOUNJARO ) 7.5 MG/0.5ML Pen Inject 7.5 mg into the skin once a week. 09/22/23   Shamleffer, Ibtehal Jaralla, MD    Allergies: Sulfonamide derivatives and Latex    Review of Systems  Gastrointestinal:  Positive for abdominal pain.  All other systems reviewed and are negative.   Updated Vital Signs BP (!) 151/76 (BP Location: Right Arm)   Pulse 75   Temp 98.2  F (36.8 C)   Resp 20   Ht 1.6 m (5' 3)   Wt 84.8 kg   SpO2 98%   BMI 33.12 kg/m   Physical Exam Vitals and nursing note reviewed.  Constitutional:      General: She is not in acute distress.    Appearance: She is well-developed.  HENT:     Head: Normocephalic and atraumatic.     Mouth/Throat:     Pharynx: No oropharyngeal exudate.  Eyes:     General: No scleral icterus.       Right eye: No discharge.        Left eye: No discharge.     Conjunctiva/sclera: Conjunctivae normal.     Pupils: Pupils are equal, round, and reactive to light.  Neck:     Thyroid : No thyromegaly.     Vascular: No JVD.  Cardiovascular:     Rate and Rhythm: Normal rate and regular rhythm.     Heart sounds: Normal heart sounds. No murmur heard.    No friction rub. No gallop.  Pulmonary:     Effort: Pulmonary effort is normal. No respiratory distress.     Breath sounds: Normal breath sounds. No wheezing or rales.  Abdominal:     General: Bowel sounds are normal. There is no distension.     Palpations: Abdomen is soft. There is no mass.     Tenderness: There is no abdominal tenderness.  Musculoskeletal:        General: No tenderness. Normal range of motion.     Cervical back: Normal range of motion and neck supple.  Lymphadenopathy:     Cervical: No cervical adenopathy.  Skin:    General: Skin is warm and dry.     Findings: No erythema or rash.  Neurological:     Mental Status: She is alert.     Coordination: Coordination normal.  Psychiatric:        Behavior: Behavior normal.     (all labs ordered are listed, but only abnormal results are displayed) Labs Reviewed  URINALYSIS, ROUTINE W REFLEX MICROSCOPIC - Abnormal; Notable for the following components:      Result Value   APPearance HAZY (*)    Protein, ur 30 (*)    Nitrite POSITIVE (*)    Leukocytes,Ua TRACE (*)    Bacteria, UA MANY (*)    All other components within normal limits  URINE CULTURE  CBC WITH DIFFERENTIAL/PLATELET   COMPREHENSIVE METABOLIC PANEL WITH GFR  LIPASE, BLOOD    EKG: None  Radiology: No results found.   Procedures   Medications Ordered in the ED  cephALEXin  (KEFLEX ) capsule 500 mg (has no administration in time range)  acetaminophen  (TYLENOL ) tablet 650 mg (has no administration in time range)  Medical Decision Making Amount and/or Complexity of Data Reviewed Labs: ordered.  Risk OTC drugs. Prescription drug management.   The patient has normal vital signs, there is no reproducible abdominal tenderness, she is not tachycardic or febrile, she will need a change of her antibiotics, she has no leukocytosis, her urinalysis is consistent with recurrent UTI with many bacteria, positive for nitrites, will send off culture  Cephalexin  and Tylenol  given  No signs of sepsis  Stable for discharge,  Patient agreeable to the plan and understands her results.     Final diagnoses:  Acute cystitis without hematuria    ED Discharge Orders          Ordered    cephALEXin  (KEFLEX ) 500 MG capsule  3 times daily        02/29/24 1923               Cleotilde Rogue, MD 02/29/24 1924

## 2024-02-29 NOTE — Discharge Instructions (Addendum)
 You have been prescribed Cephalexin , 500 mg capsules.  This is an antibiotic that is used to treat multiple different infections, please take this medicine until it has completed - even if you are feeling better immediately.  As a side effect, this may cause diarrhea in some people, if you are developing any signs of an allergic reaction please stop the medication immediately and call your doctor or return to the emergency department for worsening symptoms  Thank you for allowing us  to treat you in the emergency department today.  After reviewing your examination and potential testing that was done it appears that you are safe to go home.  I would like for you to follow-up with your doctor within the next several days, have them obtain your records and follow-up with them to review all potential tests and results from your visit.  If you should develop severe or worsening symptoms return to the emergency department immediately

## 2024-02-29 NOTE — ED Triage Notes (Signed)
 Pt arrived via POV c/o lower abdominal pain and back pain. Pt reports recent treatment for a UTI and has finished her medications. Pt reports a few days ago, the pain returned and reports foul smelling urine.

## 2024-03-03 LAB — URINE CULTURE: Culture: >=100000 — AB

## 2024-03-04 ENCOUNTER — Telehealth (HOSPITAL_BASED_OUTPATIENT_CLINIC_OR_DEPARTMENT_OTHER): Payer: Self-pay | Admitting: *Deleted

## 2024-03-04 NOTE — Telephone Encounter (Signed)
 Post ED Visit - Positive Culture Follow-up  Culture report reviewed by antimicrobial stewardship pharmacist: [facility] [x]  Koren Or, Pharm.D. []  Venetia Gully, Pharm.D., BCPS AQ-ID []  Garrel Crews, Pharm.D., BCPS []  Almarie Lunger, Pharm.D., BCPS []  [place], [address].D., BCPS, AAHIVP []  Rosaline Bihari, Pharm.D., BCPS, AAHIVP []  Vernell Meier, PharmD, BCPS []  Latanya Hint, PharmD, BCPS []  Donald Medley, PharmD, BCPS []  Rocky Bold, PharmD []  Dorothyann Alert, PharmD, BCPS []  Morene Babe, PharmD  [facility] []  Rosaline Edison, PharmD []  Romona Bliss, PharmD []  Dolphus Roller, PharmD []  Veva Seip, Rph []  Vernell Daunt) Leonce, PharmD []  Eva Allis, PharmD []  Rosaline Millet, PharmD []  Iantha Batch, PharmD []  Arvin Gauss, PharmD []  Wanda Hasting, PharmD []  Ronal Rav, PharmD []  Rocky Slade, PharmD []  Bard Jeans, PharmD   Positive urine culture Treated with cephalexin  & fosfomycin, organism sensitive to the same and no further patient follow-up is required at this time.  Sarah [name] 03/04/2024, 12:35 PM

## 2024-03-26 ENCOUNTER — Other Ambulatory Visit: Payer: Self-pay | Admitting: Cardiovascular Disease

## 2024-03-29 DIAGNOSIS — E1165 Type 2 diabetes mellitus with hyperglycemia: Secondary | ICD-10-CM | POA: Diagnosis not present

## 2024-04-03 DIAGNOSIS — E1165 Type 2 diabetes mellitus with hyperglycemia: Secondary | ICD-10-CM | POA: Diagnosis not present

## 2024-04-03 DIAGNOSIS — F419 Anxiety disorder, unspecified: Secondary | ICD-10-CM | POA: Diagnosis not present

## 2024-04-03 DIAGNOSIS — E669 Obesity, unspecified: Secondary | ICD-10-CM | POA: Diagnosis not present

## 2024-04-03 DIAGNOSIS — R7401 Elevation of levels of liver transaminase levels: Secondary | ICD-10-CM | POA: Diagnosis not present

## 2024-04-03 DIAGNOSIS — F5105 Insomnia due to other mental disorder: Secondary | ICD-10-CM | POA: Diagnosis not present

## 2024-04-03 DIAGNOSIS — I472 Ventricular tachycardia, unspecified: Secondary | ICD-10-CM | POA: Diagnosis not present

## 2024-04-03 DIAGNOSIS — K5900 Constipation, unspecified: Secondary | ICD-10-CM | POA: Diagnosis not present

## 2024-04-03 DIAGNOSIS — E785 Hyperlipidemia, unspecified: Secondary | ICD-10-CM | POA: Diagnosis not present

## 2024-04-03 DIAGNOSIS — I10 Essential (primary) hypertension: Secondary | ICD-10-CM | POA: Diagnosis not present

## 2024-04-04 ENCOUNTER — Other Ambulatory Visit (HOSPITAL_COMMUNITY): Payer: Self-pay | Admitting: Internal Medicine

## 2024-04-04 DIAGNOSIS — Z1231 Encounter for screening mammogram for malignant neoplasm of breast: Secondary | ICD-10-CM

## 2024-04-11 ENCOUNTER — Ambulatory Visit (HOSPITAL_COMMUNITY)
Admission: RE | Admit: 2024-04-11 | Discharge: 2024-04-11 | Disposition: A | Discharge status: Home / Self Care | Source: Ambulatory Visit | Attending: Internal Medicine | Admitting: Internal Medicine

## 2024-04-11 ENCOUNTER — Encounter (HOSPITAL_COMMUNITY): Payer: Self-pay

## 2024-04-11 DIAGNOSIS — Z1231 Encounter for screening mammogram for malignant neoplasm of breast: Secondary | ICD-10-CM | POA: Insufficient documentation

## 2024-04-19 ENCOUNTER — Ambulatory Visit: Admitting: Internal Medicine

## 2024-04-19 ENCOUNTER — Encounter: Payer: Self-pay | Admitting: Internal Medicine

## 2024-04-19 VITALS — BP 132/76 | HR 94 | Ht 63.0 in | Wt 183.6 lb

## 2024-04-19 DIAGNOSIS — Z794 Long term (current) use of insulin: Secondary | ICD-10-CM

## 2024-04-19 DIAGNOSIS — E1165 Type 2 diabetes mellitus with hyperglycemia: Secondary | ICD-10-CM | POA: Diagnosis not present

## 2024-04-19 DIAGNOSIS — E1129 Type 2 diabetes mellitus with other diabetic kidney complication: Secondary | ICD-10-CM

## 2024-04-19 DIAGNOSIS — E1142 Type 2 diabetes mellitus with diabetic polyneuropathy: Secondary | ICD-10-CM

## 2024-04-19 DIAGNOSIS — R809 Proteinuria, unspecified: Secondary | ICD-10-CM

## 2024-04-19 LAB — POCT GLYCOSYLATED HEMOGLOBIN (HGB A1C): Hemoglobin A1C: 7.3 % — AB (ref 4.0–5.6)

## 2024-04-19 MED ORDER — TIRZEPATIDE 5 MG/0.5ML ~~LOC~~ SOAJ
5.0000 mg | SUBCUTANEOUS | 3 refills | Status: AC
Start: 2024-04-19 — End: ?

## 2024-04-19 MED ORDER — TRESIBA FLEXTOUCH 100 UNIT/ML ~~LOC~~ SOPN: 50.0000 [IU] | PEN_INJECTOR | Freq: Every day | SUBCUTANEOUS | 3 refills | Status: AC

## 2024-04-19 MED ORDER — PEN NEEDLES 32G X 4 MM MISC: 1.0000 | Freq: Four times a day (QID) | 3 refills | Status: AC

## 2024-04-19 MED ORDER — NOVOLOG FLEXPEN 100 UNIT/ML ~~LOC~~ SOPN: PEN_INJECTOR | SUBCUTANEOUS | 3 refills | Status: DC

## 2024-04-19 NOTE — Progress Notes (Signed)
 Name: Sarah [name]  Age/ Sex: 63 y.o., female   MRN/ DOB: [id number], [birth year]     PCP: Sarah Norleen PEDLAR, MD   Reason for Endocrinology Evaluation: Type 2 Diabetes Mellitus  Initial Endocrine Consultative Visit: 04/06/2021    PATIENT IDENTIFIER: Sarah [name] is a 63 y.o. female with a past medical history of DM, HTN, acute pyelonephritis . The patient has followed with Endocrinology clinic since 04/06/2021 for consultative assistance with management of her diabetes.  DIABETIC HISTORY:  Sarah [name] was diagnosed with DM 2002, and started insulin  therapy in 2012. Her hemoglobin A1c has ranged from 9.4% in 2021, peaking at 12.4% in 2022.    Tandem was started in 2022   She was followed by Sarah [name] from August 2022 until October 2022  Ozempic  was cost prohibitive   She was denied the tandem pump 2024, and was eventually started on the bionic insulin  pump through a trial directly through the company 2025   I switched amlodipine to losartan  08/2023, due to elevated MA/CR ratio   SUBJECTIVE:   During the last visit (09/21/2023): A1c 6.7%   Today (04/19/2024): Sarah [name] is here for a follow up on diabetes management.  She typically checks her blood sugars multiple times daily through CGM. Unable to download today  She endorses nausea and vomiting on the first day of taking mounjaro , she was prescribed antacid through PCPs office that she has been taking for a week, with no improvement in her GI symptoms Has chronic constipation, was advised by her PCP to take Linzess  Continues with  tingling and numbness of the feet, worse at night    HOME DIABETES REGIMEN:  Mounjaro  7.5 mg weekly Tresiba  50 units daily Novolog  15 units with each meal CF: NovoLog  (BG -130/20) TIDQAC     Statin: yes ACE-I/ARB: no Prior Diabetic Education: yes      DIABETIC COMPLICATIONS: Microvascular complications:  Neuropathy  Denies: CKD , retinopathy Last Eye Exam: Completed  2022  Macrovascular complications:   Denies: CAD, CVA, PVD   HISTORY:  Past Medical History:  Past Medical History:  Diagnosis Date   Anxiety    Arthritis    left knee   Diabetes mellitus    type II-pt uses insulin  pump   Diverticulosis    GERD (gastroesophageal reflux disease)    Hypercholesteremia    Hypertension    Insulin  pump in place    DM type II   Neuropathy    Past Surgical History:  Past Surgical History:  Procedure Laterality Date   ABDOMINAL HYSTERECTOMY     complete   CARDIAC CATHETERIZATION  2011   COLONOSCOPY  01/04/2010   left sided transverse diverticula/two diminutive rectal polyps   COLONOSCOPY  03/28/2012   Surgeon: Sarah CHRISTELLA Hollingshead, MD; riable anal canal with suspected trivial rectal bleeding from this location, colonic diverticulosis, s/p segmental resection, single diminutive polyp in the descending colon resected.  Pathology was benign.  Recommended 10-year repeat.   COLONOSCOPY N/A 09/11/2018   Surgeon: Sarah Anes, MD;   moderate diverticulosis in the descending colon, otherwise normal exam.  Recommended 10-year repeat.   FOOT SURGERY     right-tendon repair   laparoscopic sigmoid colectomy  02/01/2010   Sarah Sarah [name]   TRIGGER FINGER RELEASE Left 03/2015   TRIGGER FINGER RELEASE Right 02/25/2016   Procedure: RIGHT LONG TRIGGER FINGER RELEASE;  Surgeon: Sarah Sarah Minerva, MD;  Location: [facility];  Service: Orthopedics;  Laterality: Right;   UMBILICAL HERNIA REPAIR N/A  02/05/2020   Procedure: UMBILICAL HERNIORRHAPY WITH MESH;  Surgeon: Sarah Anes, MD;  Location: [facility];  Service: General;  Laterality: N/A;   Social History:  reports that she quit smoking about 17 years ago. Her smoking use included cigarettes. She started smoking about 27 years ago. She has a 10 pack-year smoking history. She has never used smokeless tobacco. She reports that she does not currently use alcohol. She reports that she does not use drugs. Family  History:  Family History  Problem Relation Age of Onset   Colon polyps Mother        in her 19s   Diabetes Mother    Hypertension Mother    Heart attack Father    Heart murmur Sister    Hypertension Brother    Colon cancer Maternal Grandfather        57   Diabetes Son    Liver disease Neg Hx      HOME MEDICATIONS: Allergies as of 04/19/2024       Reactions   Sulfonamide Derivatives Itching   Latex Itching, Rash, Other (See Comments)   Powdered only        Medication List        Accurate as of April 19, 2024  2:14 PM. If you have any questions, ask your nurse or doctor.          STOP taking these medications    tirzepatide  7.5 MG/0.5ML Pen Commonly known as: MOUNJARO  Replaced by: tirzepatide  5 MG/0.5ML Pen Stopped by: Sarah [name]       TAKE these medications    albuterol  108 (90 Base) MCG/ACT inhaler Commonly known as: VENTOLIN  HFA SMARTSIG:2 Puff(s) By Mouth Every 4-6 Hours PRN   aspirin  EC 81 MG tablet Take 81 mg by mouth daily.   benzonatate  100 MG capsule Commonly known as: TESSALON  Take 2 capsules (200 mg total) by mouth 3 (three) times daily as needed.   co-enzyme Q-10 30 MG capsule Take 30 mg by mouth daily.   cyclobenzaprine  10 MG tablet Commonly known as: FLEXERIL  Take 1 tablet (10 mg total) by mouth 3 (three) times daily as needed for muscle spasms.   Dexcom G7 Sensor Misc 1 Device by Does not apply route as directed.   diclofenac  50 MG EC tablet Commonly known as: VOLTAREN  Take 1 tablet (50 mg total) by mouth 2 (two) times daily.   diclofenac  Sodium 1 % Gel Commonly known as: Voltaren  Apply 2 g topically 4 (four) times daily.   escitalopram 5 MG tablet Commonly known as: LEXAPRO Take 10 mg by mouth at bedtime.   fosfomycin 3 g Pack Commonly known as: MONUROL Take 3 g by mouth once.   gabapentin  300 MG capsule Commonly known as: NEURONTIN  Take 1 capsule (300 mg total) by mouth at bedtime.   Gvoke PFS 1  MG/0.2ML Sosy Generic drug: Glucagon SMARTSIG:1 Milligram(s) SUB-Q   ibuprofen  800 MG tablet Commonly known as: ADVIL  Take 800 mg by mouth every 6 (six) hours as needed for moderate pain.   linaclotide  145 MCG Caps capsule Commonly known as: Linzess  Take 1 capsule (145 mcg total) by mouth daily before breakfast.   lisinopril  10 MG tablet Commonly known as: ZESTRIL  Take 1 tablet (10 mg total) by mouth daily.   LORazepam  0.5 MG tablet Commonly known as: ATIVAN  Take 0.5 mg by mouth 2 (two) times daily as needed for anxiety.   NovoLOG  FlexPen 100 UNIT/ML FlexPen Generic drug: insulin  aspart Max daily 60 units What changed:  additional instructions Changed by: Sarah PARAS Sharonda Llamas   omeprazole 20 MG capsule Commonly known as: PRILOSEC Take 20 mg by mouth daily.   Pen Needles 32G X 4 MM Misc 1 Device by Other route in the morning, at noon, in the evening, and at bedtime. Use to inject insulin    tirzepatide  5 MG/0.5ML Pen Commonly known as: MOUNJARO  Inject 5 mg into the skin once a week. Replaces: tirzepatide  7.5 MG/0.5ML Pen Started by: Sarah PARAS Tima Curet   Tresiba  FlexTouch 100 UNIT/ML FlexTouch Pen Generic drug: insulin  degludec Inject 50 Units into the skin daily.   Vitamin D3 1.25 MG (50000 UT) Caps Take 1 capsule by mouth once a week.         OBJECTIVE:   Vital Signs: BP 132/76 (BP Location: Left Arm, Patient Position: Sitting, Cuff Size: Normal)   Pulse 94   Ht 5' 3 (1.6 m)   Wt 183 lb 9.6 oz (83.3 kg)   SpO2 98%   BMI 32.52 kg/m   Wt Readings from Last 3 Encounters:  04/19/24 183 lb 9.6 oz (83.3 kg)  02/29/24 186 lb 15.2 oz (84.8 kg)  02/15/24 187 lb (84.8 kg)     Exam: General: Pt appears well and is in NAD  Lungs: Clear with good BS bilat   Heart: RRR , + systolic murmur  Extremities: No pretibial edema.   Neuro: MS is good with appropriate affect, pt is alert and Ox3    DM foot exam: 04/19/2024      The skin of the feet is intact  without sores or ulcerations. The pedal pulses are 2+ on right and 2+ on left. The sensation is absent to a screening 5.07, 10 gram monofilament bilaterally at the heels     DATA REVIEWED:  Lab Results  Component Value Date   HGBA1C 7.3 (A) 04/19/2024   HGBA1C 6.7 (A) 12/18/2023   HGBA1C 11.6 (A) 09/21/2023    Latest Reference Range & Units 06/18/23 08:49  Sodium 135 - 145 mmol/L 131 (L)  Potassium 3.5 - 5.1 mmol/L 4.1  Chloride 98 - 111 mmol/L 100  CO2 22 - 32 mmol/L 23  Glucose 70 - 99 mg/dL 676 (H)  BUN 8 - 23 mg/dL 18  Creatinine 9.55 - 8.99 mg/dL 9.32  Calcium  8.9 - 10.3 mg/dL 8.4 (L)  Anion gap 5 - 15  8  Magnesium  1.7 - 2.4 mg/dL 1.8  Alkaline Phosphatase 38 - 126 U/L 89  Albumin 3.5 - 5.0 g/dL 3.1 (L)  AST 15 - 41 U/L 30  ALT 0 - 44 U/L 34  Total Protein 6.5 - 8.1 g/dL 7.1  Total Bilirubin 0.3 - 1.2 mg/dL 0.4  GFR, Estimated >39 mL/min >60     Latest Reference Range & Units 12/18/23 13:23  Appearance CLEAR  CLEAR  Bilirubin Urine NEGATIVE  NEGATIVE  Color, Urine YELLOW  YELLOW  Glucose, UA NEGATIVE  NEGATIVE  Hgb urine dipstick NEGATIVE  NEGATIVE  Ketones, ur NEGATIVE  NEGATIVE  Leukocyte Esterase NEGATIVE  NEGATIVE  pH 5.0 - 8.0  6.5  Protein NEGATIVE  NEGATIVE  Specific Gravity, Urine 1.001 - 1.035  1.007  Bacteria, UA NONE SEEN /HPF MANY !  Hyaline Cast NONE SEEN /LPF NONE SEEN  RBC / HPF 0 - 2 /HPF NONE SEEN  REFLEXIVE URINE CULTURE  Pend  Squamous Epithelial / HPF < OR = 5 /HPF NONE SEEN  WBC, UA 0 - 5 /HPF NONE SEEN    Latest Reference Range & Units  12/18/23 13:23  Nitrites, Initial NEGATIVE  NEGATIVE     ASSESSMENT / PLAN / RECOMMENDATIONS:   1) Type 2 Diabetes Mellitus, Optimally controlled, With Neuropathic complications and microalbuminuria - Most recent A1c of 7.3%. Goal A1c < 7.0 %.    - Her A1c continues to be better than when she was on  the insulin  pump -Patient has been noted with GI side effects to Mounjaro  7.5 mg dose, will  decrease -She has certainly done better with multiple daily injections rather than insulin  pumps.  With the insulin  pump she was having financial hardship with intermittent use -Not a candidate for SGLT2 inhibitors due to history of pyelonephritis, she also developed cramps on Jardiance  MEDICATIONS: Decrease Mounjaro  5 mg weekly Continue Tresiba  50 units daily Continue NovoLog  16 units with each meal Continue CF: Novolog  (BG-130/20) TIDQAC     EDUCATION / INSTRUCTIONS: BG monitoring instructions: Patient is instructed to check her blood sugars 3 times a day, before each meal . Call [facility] if: BG persistently < 70  I reviewed the Rule of 15 for the treatment of hypoglycemia in detail with the patient. Literature supplied.    2) Diabetic complications:  Eye: Does not have known diabetic retinopathy.  Neuro/ Feet: Does  have known diabetic peripheral neuropathy .  Renal: Patient does not have known baseline CKD. She   is  on an ACEI/ARB at present.    3) Microalbuminuria :  -MA/CR ratio elevated -We emphasized the importance of optimizing glucose control -I have switched amlodipine to losartan , but she developed a strong urine odor that she attributed to losartan , will change to lisinopril   Medication  lisinopril  10 mg daily    5) Peripheral neuropathy  - I had increased gabapentin  from 100 to 300 mg in January, 2025 -Patient continues with symptomatic neuropathy -A referral has been placed to physical medicine rehab for Qutenza application  Medication  Continue gabapentin  300 mg at bedtime  F/U in 6 months     Signed electronically by: Stefano Redgie Butts, MD  [facility]  [facility] [address] [place], [place] [address] Phone: [phone] FAX: [phone]   CC: Sarah Norleen PEDLAR, MD [address] [place] [address] Phone: [phone]  Fax: [phone]  Return to Endocrinology  clinic as below: Future Appointments  Date Time Provider Department Center  10/23/2024 11:10 AM Kaven Cumbie, Sarah Redgie, MD [id number] None

## 2024-04-19 NOTE — Patient Instructions (Signed)
 Decrease Mounjaro  5 mg weekly Continue  Tresiba  50 units once daily Continue NovoLog  16 units with each meal (breakfast, lunch, dinner) Novolog  correctional insulin : ADD extra units on insulin  to your meal-time Novolog  dose if your blood sugars are higher than 150. Use the scale below to help guide you:   Blood sugar before meal Number of units to inject  Less than 150 0 unit  151 -  170 1 units  171 -  190 2 units  191 -  210 3 units  211 -  230 4 units  231 -  250 5 units  251 -  270 6 units  271 -  290 7 units  291 -  310 8 units  311 - 330 9 units    Increase gabapentin  to 300 mg, 1 tablet at bedtime Switch losartan  to lisinopril  10 mg daily   HOW TO TREAT LOW BLOOD SUGARS (Blood sugar LESS THAN 70 MG/DL) Please follow the RULE OF 15 for the treatment of hypoglycemia treatment (when your (blood sugars are less than 70 mg/dL)   STEP 1: Take 15 grams of carbohydrates when your blood sugar is low, which includes:  3-4 GLUCOSE TABS  OR 3-4 OZ OF JUICE OR REGULAR SODA OR ONE TUBE OF GLUCOSE GEL    STEP 2: RECHECK blood sugar in 15 MINUTES STEP 3: If your blood sugar is still low at the 15 minute recheck --> then, go back to STEP 1 and treat AGAIN with another 15 grams of carbohydrates.

## 2024-05-21 ENCOUNTER — Other Ambulatory Visit: Payer: Self-pay | Admitting: Cardiovascular Disease

## 2024-05-21 ENCOUNTER — Other Ambulatory Visit: Payer: Self-pay | Admitting: Internal Medicine

## 2024-05-21 DIAGNOSIS — E1165 Type 2 diabetes mellitus with hyperglycemia: Secondary | ICD-10-CM

## 2024-06-06 ENCOUNTER — Encounter: Admitting: Physical Medicine & Rehabilitation

## 2024-06-06 ENCOUNTER — Telehealth: Payer: Self-pay | Admitting: Physical Medicine & Rehabilitation

## 2024-06-06 NOTE — Telephone Encounter (Signed)
 Patient was scheduled with you today for a new patient and we rescheduled due to your computer not working.  Patient was looking forward to seeing you.  Her diabetic doctor put her on Gabapentin , but it isn't helping her pain and tingling in her feet and legs.  She is also unable to sleep and would like to know what you may suggest her to do until she sees you in November.

## 2024-06-18 ENCOUNTER — Encounter: Attending: Physical Medicine & Rehabilitation | Admitting: Physical Medicine & Rehabilitation

## 2024-06-18 ENCOUNTER — Encounter: Payer: Self-pay | Admitting: Physical Medicine & Rehabilitation

## 2024-06-18 VITALS — BP 127/76 | HR 91 | Ht 63.0 in | Wt 185.4 lb

## 2024-06-18 DIAGNOSIS — Z794 Long term (current) use of insulin: Secondary | ICD-10-CM | POA: Diagnosis not present

## 2024-06-18 DIAGNOSIS — E1165 Type 2 diabetes mellitus with hyperglycemia: Secondary | ICD-10-CM | POA: Diagnosis not present

## 2024-06-18 DIAGNOSIS — M1712 Unilateral primary osteoarthritis, left knee: Secondary | ICD-10-CM | POA: Diagnosis not present

## 2024-06-18 MED ORDER — GABAPENTIN 300 MG PO CAPS: 300.0000 mg | ORAL_CAPSULE | Freq: Three times a day (TID) | ORAL | 2 refills | Status: DC

## 2024-06-18 NOTE — Progress Notes (Signed)
 Subjective:    Patient ID: Sarah [name], female    DOB: Jul 28, 1961, 63 y.o.   MRN: [id number]  HPI  Discussed the use of AI scribe software for clinical note transcription with the patient, who gave verbal consent to proceed.    Sarah [name] is a 63 y.o. year old female  who  has a past medical history of Anxiety, Arthritis, Diabetes mellitus, Diverticulosis, GERD (gastroesophageal reflux disease), Hypercholesteremia, Hypertension, Insulin  pump in place, and Neuropathy.   They are presenting to PM&R clinic as a new patient for pain management evaluation. They were referred by [facility], [place] for treatment of diabetes neuropathy pain.   Red flag symptoms: No red flags for back pain endorsed in Hx or ROS   History of Present Illness: Reports worsening symptoms of diabetic neuropathy over the last 3 years, though present for 8-9 years. - Location: Primarily bilateral feet, bottoms more affected than tops. Some mild numbness extends to the ankles. - Symptoms:     - Daytime: Primarily numbness, with some burning depending on activity level.     - Nighttime: Symptoms are significantly worse, described as burning, painful, and pins and needles. This severely disrupts sleep. - Aggravating Factors: Nighttime. Increased walking. - Relieving Factors: Uncovering feet to allow air circulation. Taking Tylenol  helps slightly. Elevating feet in a recliner provides minimal relief. - Associated Symptoms: Occasional cramping in the tops of the feet and legs. Occasional tingling in the hands, but much less severe than feet. - Pertinent Negatives: Denies significant pain or symptoms up the legs into the calves/knees.  Current Treatments and Efficacy: - Gabapentin  300 mg at night: Reports it is not very effective but continues to take it. - Voltaren  gel: Tried on feet, provided no relief. - Tylenol : Provides slight relief when pain is severe. - Ibuprofen : Avoids due to history of diverticulitis  flare-ups. - Mounjaro : Has resulted in significant weight loss and improved energy levels. A1C has decreased from 12 to 7.3. - TENS unit: Used years ago for a knee injury and found it helpful.  Review of Systems: - General: Reports improved energy and significant weight loss since starting Mounjaro .  Past Medical History: - Diabetic neuropathy - Diabetes mellitus type 2 - Diverticulitis - Arthritis, left knee - History of bunions  Past Surgical History: - Bunionectomy, bilateral - Ingrown toenail removal, left foot - History of non-operative left ankle fracture  Medications: - Gabapentin  300 mg nightly - Lexapro - Mounjaro  - Tylenol  as needed  Allergies: - No known drug allergies.  Social History: - Uses a cane intermittently as advised by a physical therapist due to favoring the left knee.  Medications tried: Topical medications - voltaren - didn't help  Nsaids - Doesn't take much as it flairs diverticulitis  Tylenol   - Helps a little  Opiates  - Denies  Gabapentin - Not sure much benefit TCAs - Denies  SNRIs  ON lexapro, Denies    Other treatments: PT- for knee 7 months ago  TENs unit -Tried it in the past for knee, Injections - Denies  Surgery- Bunion/toenail surgery   Prior UDS results: No results found for: LABOPIA, COCAINSCRNUR, LABBENZ, AMPHETMU, THCU, LABBARB    Pain Inventory Average Pain 8 Pain Right Now 8 My pain is constant, burning, and tingling  In the last 24 hours, has pain interfered with the following? General activity na Relation with others na Enjoyment of life na What TIME of day is your pain at its worst? night Sleep (in general) Poor  Pain is worse with: some activites Pain improves with: na Relief from Meds: na  worst time is trying to sleep  use a cane do you drive?  yes  retired  numbness tingling anxiety  Any changes since last visit?  no  Any changes since last visit?  no    Family History   Problem Relation Age of Onset   Colon polyps Mother        in her 49s   Diabetes Mother    Hypertension Mother    Heart attack Father    Heart murmur Sister    Hypertension Brother    Colon cancer Maternal Grandfather        60   Diabetes Son    Liver disease Neg Hx    Social History   Socioeconomic History   Marital status: Married    Spouse name: Not on file   Number of children: 1   Years of education: Not on file   Highest education level: Not on file  Occupational History   Occupation: K runner, broadcasting/film/video asst    Employer: [employer]  Tobacco Use   Smoking status: Former    Current packs/day: 0.00    Average packs/day: 1 pack/day for 10.0 years (10.0 ttl pk-yrs)    Types: Cigarettes    Start date: 09/05/1996    Quit date: 09/05/2006    Years since quitting: 17.7   Smokeless tobacco: Never  Vaping Use   Vaping status: Never Used  Substance and Sexual Activity   Alcohol use: Not Currently    Comment: occasional   Drug use: No   Sexual activity: Yes    Birth control/protection: Surgical    Comment: hyst  Other Topics Concern   Not on file  Social History Narrative   Lives w/ husband, sister & her children , teen mom & her baby   Social Drivers of Corporate Investment Banker Strain: Not on file  Food Insecurity: Not on file  Transportation Needs: Not on file  Physical Activity: Not on file  Stress: Not on file  Social Connections: Not on file   Past Surgical History:  Procedure Laterality Date   ABDOMINAL HYSTERECTOMY     complete   CARDIAC CATHETERIZATION  2011   COLONOSCOPY  01/04/2010   left sided transverse diverticula/two diminutive rectal polyps   COLONOSCOPY  03/28/2012   Surgeon: Lamar CHRISTELLA Hollingshead, MD; riable anal canal with suspected trivial rectal bleeding from this location, colonic diverticulosis, s/p segmental resection, single diminutive polyp in the descending colon resected.  Pathology was benign.  Recommended 10-year repeat.   COLONOSCOPY  N/A 09/11/2018   Surgeon: Mavis Anes, MD;   moderate diverticulosis in the descending colon, otherwise normal exam.  Recommended 10-year repeat.   FOOT SURGERY     right-tendon repair   laparoscopic sigmoid colectomy  02/01/2010   Dr Jenkins-diverticulitis   TRIGGER FINGER RELEASE Left 03/2015   TRIGGER FINGER RELEASE Right 02/25/2016   Procedure: RIGHT LONG TRIGGER FINGER RELEASE;  Surgeon: Taft FORBES Minerva, MD;  Location: [facility];  Service: Orthopedics;  Laterality: Right;   UMBILICAL HERNIA REPAIR N/A 02/05/2020   Procedure: UMBILICAL HERNIORRHAPY WITH MESH;  Surgeon: Mavis Anes, MD;  Location: [facility];  Service: General;  Laterality: N/A;   Past Medical History:  Diagnosis Date   Anxiety    Arthritis    left knee   Diabetes mellitus    type II-pt uses insulin  pump   Diverticulosis    GERD (  gastroesophageal reflux disease)    Hypercholesteremia    Hypertension    Insulin  pump in place    DM type II   Neuropathy    BP 127/76   Pulse 91   Ht 5' 3 (1.6 m)   Wt 185 lb 6.4 oz (84.1 kg)   SpO2 96%   BMI 32.84 kg/m   Opioid Risk Score:   Fall Risk Score:  `1  Depression screen PHQ 2/9     06/18/2024    1:32 PM 11/20/2014    8:34 AM  Depression screen PHQ 2/9  Decreased Interest 1 0  Down, Depressed, Hopeless 0 0  PHQ - 2 Score 1 0  Altered sleeping 3   Tired, decreased energy 3   Change in appetite 1   Feeling bad or failure about yourself  0   Trouble concentrating 0   Moving slowly or fidgety/restless 0   Suicidal thoughts 0   PHQ-9 Score 8   Difficult doing work/chores Somewhat difficult      Review of Systems  Constitutional:  Positive for diaphoresis.  Gastrointestinal:  Positive for constipation and nausea.  Musculoskeletal:  Positive for gait problem.  Neurological:  Positive for numbness.       Tingling  Psychiatric/Behavioral:  The patient is nervous/anxious.   All other systems reviewed and are negative.      Objective:   Physical  Exam  Gen: no distress, normal appearing HEENT: oral mucosa pink and moist, NCAT Chest: normal effort, normal rate of breathing Abd: soft, non-distended Ext: no edema Psych: pleasant, normal affect Skin: intact Neuro: Alert and awake, follows commands, cranial nerves II through XII grossly intact, normal speech and language RUE: 5/5 Deltoid, 5/5 Biceps, 5/5 Triceps, 5/5 Wrist Ext, 5/5 Grip LUE: 5/5 Deltoid, 5/5 Biceps, 5/5 Triceps, 5/5 Wrist Ext, 5/5 Grip RLE: HF 5/5, KE 5/5, ADF 5/5, APF 5/5 LLE: HF 4+/5, KE 4+/5, ADF 4+/5, APF 4+/5- pain limited Sensory exam normal for light touch and pain in all 4 limbs. No limb ataxia or cerebellar signs. No abnormal tone appreciated.   Musculoskeletal:  - Left Knee:     - Inspection: No visible effusion or erythema.     - Palpation: Tenderness to palpation over the lateral joint line. No tenderness over the medial joint line.     - Range of Motion: Limited extension. Flexion is painful.  - Neurologic/Lower Extremity:     - Sensation: Significant numbness to light touch from the toes to the ankles bilaterally, more pronounced distally. Normal sensation at the level of the knees. Proprioception is absent in the great toes bilaterally.     - Skin: Feet are inspected. No open wounds, ulcers, or skin breakdown noted. Skin appears intact.  Knee xray 06/14/24 3 views left knee   Normal alignment   Osteophyte seen medially small   Patellofemoral alignment normal with what appears to be narrowing of the lateral facet and the lateral trochlea   Mild grade 2 disease definitive with possibility of joint space narrowing      Assessment & Plan:   ASSESSMENT:  1.  Diabetic Polyneuropathy: Chronic, symptomatic, and progressing. Pain is poorly controlled on current regimen of Gabapentin  300 mg at night, significantly impacting sleep. 2.  Left Knee Osteoarthritis: Chronic, symptomatic. Limited by pain and reduced range of motion. Has declined injections  in the past but is now reconsidering.  PLAN:  1.  Diabetic Neuropathy:     - Medication: Increase Gabapentin . Will start 300 mg  twice daily for 4 days, then increase to 300 mg three times daily if tolerated. Prescription sent to pharmacy.     - Procedure: Discussed Qutenza (capsaicin 8%) patch treatment. Counseled on risks, benefits, and procedure, which is performed in-office every 3 months. Counseled that the primary side effect is a temporary burning sensation. Advised that insurance pre-authorization is required.     - Diet: Provided with informational handouts on anti-inflammatory and diabetic-friendly foods (e.g., turmeric, tart cherry juice, salmon, olive oil) for dietary consideration.     - Education: Counseled on the importance of daily foot inspection to monitor for any skin breakdown or wounds, given the profound numbness. Discussed TENS unit as a non-pharmacologic option, noting they are available over-the-counter.  2.  Left Knee Osteoarthritis:     - Procedure: Will plan for a left knee intra-articular corticosteroid injection at the next visit, as requested. Counseled on risks and benefits.  3.  Follow-up:     - Schedule follow-up in approximately 4 weeks to assess response to increased Gabapentin  dose and to perform the left knee injection. The Qutenza treatment will also be performed at that time if approved by insurance. The office will contact regarding insurance authorization status.

## 2024-07-12 ENCOUNTER — Encounter: Admitting: Physical Medicine & Rehabilitation

## 2024-07-15 ENCOUNTER — Other Ambulatory Visit (HOSPITAL_COMMUNITY): Payer: Self-pay | Admitting: Nurse Practitioner

## 2024-07-15 ENCOUNTER — Ambulatory Visit (HOSPITAL_COMMUNITY)
Admission: RE | Admit: 2024-07-15 | Discharge: 2024-07-15 | Disposition: A | Discharge status: Home / Self Care | Source: Ambulatory Visit | Attending: Nurse Practitioner | Admitting: Nurse Practitioner

## 2024-07-15 DIAGNOSIS — R159 Full incontinence of feces: Secondary | ICD-10-CM

## 2024-07-15 DIAGNOSIS — M160 Bilateral primary osteoarthritis of hip: Secondary | ICD-10-CM | POA: Diagnosis not present

## 2024-07-15 DIAGNOSIS — R300 Dysuria: Secondary | ICD-10-CM | POA: Diagnosis not present

## 2024-07-15 DIAGNOSIS — E1165 Type 2 diabetes mellitus with hyperglycemia: Secondary | ICD-10-CM | POA: Diagnosis not present

## 2024-07-15 DIAGNOSIS — M778 Other enthesopathies, not elsewhere classified: Secondary | ICD-10-CM | POA: Diagnosis not present

## 2024-07-16 ENCOUNTER — Encounter: Payer: Self-pay | Admitting: Gastroenterology

## 2024-07-25 ENCOUNTER — Encounter: Admitting: Physical Medicine & Rehabilitation

## 2024-09-03 ENCOUNTER — Encounter: Payer: Self-pay | Admitting: Gastroenterology

## 2024-09-03 ENCOUNTER — Ambulatory Visit: Admitting: Gastroenterology

## 2024-09-03 VITALS — BP 136/84 | HR 81 | Temp 98.6°F | Ht 63.5 in | Wt 187.0 lb

## 2024-09-03 DIAGNOSIS — K760 Fatty (change of) liver, not elsewhere classified: Secondary | ICD-10-CM | POA: Diagnosis not present

## 2024-09-03 DIAGNOSIS — N390 Urinary tract infection, site not specified: Secondary | ICD-10-CM

## 2024-09-03 DIAGNOSIS — R300 Dysuria: Secondary | ICD-10-CM | POA: Insufficient documentation

## 2024-09-03 DIAGNOSIS — R1030 Lower abdominal pain, unspecified: Secondary | ICD-10-CM

## 2024-09-03 DIAGNOSIS — K5909 Other constipation: Secondary | ICD-10-CM | POA: Diagnosis not present

## 2024-09-03 DIAGNOSIS — R109 Unspecified abdominal pain: Secondary | ICD-10-CM | POA: Insufficient documentation

## 2024-09-03 DIAGNOSIS — K219 Gastro-esophageal reflux disease without esophagitis: Secondary | ICD-10-CM | POA: Diagnosis not present

## 2024-09-03 DIAGNOSIS — K5900 Constipation, unspecified: Secondary | ICD-10-CM

## 2024-09-03 MED ORDER — LINACLOTIDE 145 MCG PO CAPS: 145.0000 ug | ORAL_CAPSULE | Freq: Every day | ORAL | Status: DC

## 2024-09-03 MED ORDER — LINACLOTIDE 145 MCG PO CAPS: 145.0000 ug | ORAL_CAPSULE | Freq: Every day | ORAL | 5 refills | Status: DC

## 2024-09-03 NOTE — Progress Notes (Unsigned)
 "    GI Office Note    Referring Provider: Joeann Browning, FNP Primary Care Physician:  Joeann Browning, FNP  Primary Gastroenterologist: Ozell Hollingshead, MD   Chief Complaint   Chief Complaint  Patient presents with   New Patient (Initial Visit)    Pt being seen for Full incontinence of feces.    History of Present Illness   Sarah [name] is a 64 y.o. female presenting today at the request of Browning Joeann, FNP for fecal incontinence.  Patient last seen in 2023 for constipation and elevated LFTs workup suspicious for hepatic steatosis based on serologies and imaging.  Patient with history of recurrent UTIs, multiple rounds of antibiotics 2025.  Discussed the use of AI scribe software for clinical note transcription with the patient, who gave verbal consent to proceed.  History of Present Illness   Constipation is longstanding with bowel movements only once or twice per week, often small and pellet-like and not fully productive. Last bowel movement was on Sunday. Miralax two packets daily for about a month gave minimal benefit. Linzess  was previously effective but was stopped due to insurance. She uses fruits and juice to try to stimulate bowel movements. Metformin was stopped because it caused frequent, urgent bowel movements and prior episodes of stool incontinence.    Lower abdominal soreness has been present for two weeks. Pain is constant, bilateral, cramping, and worsened by eating and sometimes movement. It is associated with ongoing constipation. She is cautious with diet due to diverticulitis and is worried about recurrence, history of sigmoid colectomy for complicated diverticulitis over 10 years ago.   She has been on Mounjaro  5 mg weekly for 1 to 2 years and takes injections on Monday nights.  A few days after Mounjaro  injections she has frequent sulfur or egg burping. She has heartburn variably related to food intake that is adequately controlled with  omeprazole.  She has recurrent urinary tract infections characterized by malodorous urine and a sense of pressure or tightness in the lower abdomen. Currently she notes strong, unusual urine odor and increased lower abdominal pressure without dysuria. She has not seen urology.  She has lost about 20 pounds, from 180 to 160 pounds, by home morning weights. She has no history of kidney stones. She believes her appendix and gallbladder are intact.   Wt Readings from Last 20 Encounters:  09/03/24 187 lb (84.8 kg)  06/18/24 185 lb 6.4 oz (84.1 kg)  04/19/24 183 lb 9.6 oz (83.3 kg)  02/29/24 186 lb 15.2 oz (84.8 kg)  02/15/24 187 lb (84.8 kg)  12/18/23 181 lb (82.1 kg)  11/03/23 189 lb (85.7 kg)  09/21/23 189 lb (85.7 kg)  06/18/23 189 lb (85.7 kg)  06/15/23 190 lb (86.2 kg)  04/23/23 194 lb 0.1 oz (88 kg)  03/21/23 193 lb (87.5 kg)  10/14/22 194 lb (88 kg)  09/22/22 195 lb (88.5 kg)  06/10/22 200 lb 3.2 oz (90.8 kg)  06/07/22 197 lb (89.4 kg)  05/09/22 203 lb 0.7 oz (92.1 kg)  03/05/22 203 lb 0.7 oz (92.1 kg)  03/03/22 189 lb (85.7 kg)  12/10/21 195 lb (88.5 kg)     Prior Data     Results    Labs from August 2025: A1c 7.3, total bilirubin 0.3, alk phos 83, AST 17, ALT 17, albumin 3.9, platelets 331, hemoglobin 14, white blood cell count 7.2  Abdominal x-ray July 15, 2024: Moderate stool in the right colon  Colonoscopy January 2020: -Moderate diverticulosis in the  descending colon - Entire examined colon otherwise normal - Colonoscopy in 10 years  Medications   Current Outpatient Medications  Medication Sig Dispense Refill   albuterol  (VENTOLIN  HFA) 108 (90 Base) MCG/ACT inhaler SMARTSIG:2 Puff(s) By Mouth Every 4-6 Hours PRN     aspirin  EC 81 MG tablet Take 81 mg by mouth daily.     Cholecalciferol (VITAMIN D3) 1.25 MG (50000 UT) CAPS Take 1 capsule by mouth once a week.     co-enzyme Q-10 30 MG capsule Take 30 mg by mouth daily.     Continuous Blood Gluc Sensor  (DEXCOM G7 SENSOR) MISC 1 Device by Does not apply route as directed. 9 each 3   cyclobenzaprine  (FLEXERIL ) 10 MG tablet Take 1 tablet (10 mg total) by mouth 3 (three) times daily as needed for muscle spasms. 30 tablet 0   diclofenac  Sodium (VOLTAREN ) 1 % GEL Apply 2 g topically 4 (four) times daily. 2 g 5   escitalopram (LEXAPRO) 5 MG tablet Take 10 mg by mouth at bedtime.     gabapentin  (NEURONTIN ) 300 MG capsule Take 1 capsule (300 mg total) by mouth 3 (three) times daily. Increase to twice a day for 4 days, then can increase to three times a day if tolerating 90 capsule 2   ibuprofen  (ADVIL ) 800 MG tablet Take 800 mg by mouth every 6 (six) hours as needed for moderate pain.     insulin  degludec (TRESIBA  FLEXTOUCH) 100 UNIT/ML FlexTouch Pen Inject 50 Units into the skin daily. 45 mL 3   Insulin  Pen Needle (PEN NEEDLES) 32G X 4 MM MISC 1 Device by Other route in the morning, at noon, in the evening, and at bedtime. Use to inject insulin  400 each 3   lisinopril  (ZESTRIL ) 10 MG tablet Take 1 tablet (10 mg total) by mouth daily. 90 tablet 3   LORazepam  (ATIVAN ) 0.5 MG tablet Take 0.5 mg by mouth 2 (two) times daily as needed for anxiety.     omeprazole (PRILOSEC) 20 MG capsule Take 20 mg by mouth daily.     tirzepatide  (MOUNJARO ) 5 MG/0.5ML Pen Inject 5 mg into the skin once a week. 6 mL 3   No current facility-administered medications for this visit.    Allergies   Allergies as of 09/03/2024 - Review Complete 09/03/2024  Allergen Reaction Noted   Sulfonamide derivatives Itching    Latex Itching, Rash, and Other (See Comments) 12/12/2011     Past Medical History   Past Medical History:  Diagnosis Date   Anxiety    Arthritis    left knee   Diabetes mellitus    type II-pt uses insulin  pump   Diverticulosis    GERD (gastroesophageal reflux disease)    Hypercholesteremia    Hypertension    Insulin  pump in place    DM type II   Neuropathy     Past Surgical History   Past  Surgical History:  Procedure Laterality Date   ABDOMINAL HYSTERECTOMY     complete   CARDIAC CATHETERIZATION  2011   COLONOSCOPY  01/04/2010   left sided transverse diverticula/two diminutive rectal polyps   COLONOSCOPY  03/28/2012   Surgeon: Lamar CHRISTELLA Hollingshead, MD; riable anal canal with suspected trivial rectal bleeding from this location, colonic diverticulosis, s/p segmental resection, single diminutive polyp in the descending colon resected.  Pathology was benign.  Recommended 10-year repeat.   COLONOSCOPY N/A 09/11/2018   Surgeon: Mavis Anes, MD;   moderate diverticulosis in the descending colon, otherwise normal  exam.  Recommended 10-year repeat.   FOOT SURGERY     right-tendon repair   laparoscopic sigmoid colectomy  02/01/2010   Dr Jenkins-diverticulitis   TRIGGER FINGER RELEASE Left 03/2015   TRIGGER FINGER RELEASE Right 02/25/2016   Procedure: RIGHT LONG TRIGGER FINGER RELEASE;  Surgeon: Taft FORBES Minerva, MD;  Location: [facility];  Service: Orthopedics;  Laterality: Right;   UMBILICAL HERNIA REPAIR N/A 02/05/2020   Procedure: UMBILICAL HERNIORRHAPY WITH MESH;  Surgeon: Mavis Anes, MD;  Location: [facility];  Service: General;  Laterality: N/A;    Past Family History   Family History  Problem Relation Age of Onset   Colon polyps Mother        in her 55s   Diabetes Mother    Hypertension Mother    Heart attack Father    Heart murmur Sister    Hypertension Brother    Colon cancer Maternal Grandfather        40   Diabetes Son    Liver disease Neg Hx     Past Social History   Social History   Socioeconomic History   Marital status: Married    Spouse name: Not on file   Number of children: 1   Years of education: Not on file   Highest education level: Not on file  Occupational History   Occupation: K runner, broadcasting/film/video asst    Employer: [employer]  Tobacco Use   Smoking status: Former    Current packs/day: 0.00    Average packs/day: 1 pack/day for 10.0 years  (10.0 ttl pk-yrs)    Types: Cigarettes    Start date: 09/05/1996    Quit date: 09/05/2006    Years since quitting: 18.0   Smokeless tobacco: Never  Vaping Use   Vaping status: Never Used  Substance and Sexual Activity   Alcohol use: Not Currently    Comment: occasional   Drug use: No   Sexual activity: Yes    Birth control/protection: Surgical    Comment: hyst  Other Topics Concern   Not on file  Social History Narrative   Lives w/ husband, sister & her children , teen mom & her baby   Social Drivers of Health   Tobacco Use: Medium Risk (09/03/2024)   Patient History    Smoking Tobacco Use: Former    Smokeless Tobacco Use: Never    Passive Exposure: Not on Actuary Strain: Not on file  Food Insecurity: Not on file  Transportation Needs: Not on file  Physical Activity: Not on file  Stress: Not on file  Social Connections: Not on file  Intimate Partner Violence: Not on file  Depression (PHQ2-9): Medium Risk (06/18/2024)   Depression (PHQ2-9)    PHQ-2 Score: 8  Alcohol Screen: Not on file  Housing: Not on file  Utilities: Not on file  Health Literacy: Not on file    Review of Systems   General: Negative for anorexia, weight loss, fever, chills, fatigue, weakness. ENT: Negative for hoarseness, difficulty swallowing , nasal congestion. CV: Negative for chest pain, angina, palpitations, dyspnea on exertion, peripheral edema.  Respiratory: Negative for dyspnea at rest, dyspnea on exertion, cough, sputum, wheezing.  GI: See history of present illness. GU:  Negative for dysuria, hematuria, urinary incontinence, urinary frequency, nocturnal urination.  Endo: Negative for unusual weight change.     Physical Exam   BP 136/84   Pulse 81   Temp 98.6 F (37 C)   Ht 5' 3.5 (1.613 m)  Wt 187 lb (84.8 kg)   BMI 32.61 kg/m    General: Well-nourished, well-developed in no acute distress.  Eyes: No icterus. Mouth: Oropharyngeal mucosa moist and pink    Lungs: Clear to auscultation bilaterally.  Heart: Regular rate and rhythm, no murmurs rubs or gallops.  Abdomen: Bowel sounds are normal,  nondistended, no hepatosplenomegaly or masses,  no abdominal bruits or hernia , no rebound or guarding. Mild to moderate tenderness in lower abdomen Rectal: not performed Extremities: No lower extremity edema. No clubbing or deformities. Neuro: Alert and oriented x 4   Skin: Warm and dry, no jaundice.   Psych: Alert and cooperative, normal mood and affect.  Labs   See above  Imaging Studies   No results found.  Assessment/Plan:    Assessment & Plan Chronic constipation Severe, refractory constipation likely multifactorial, exacerbated by Mounjaro  therapy. - Reviewed medication options and insurance/formulary limitations. - Provided Linzess  145mcg samples for use if prescription is not immediately available. - Advised to report efficacy and tolerability via MyChart or phone.  Abdominal pain associated with constipation Persistent bilateral lower abdominal pain likely related to constipation, with history of diverticulitis and prior colon surgery. - Discussed management options; elected to treat constipation first and defer CT scan unless symptoms persist. - Advised to provide progress report next week if pain does not improve after constipation management. - Planned to order abdominal CT scan if pain persists or worsens after constipation treatment. -labs ordered to evaluate abdominal pain  Gastroesophageal reflux disease Chronic GERD well-controlled on omeprazole. - Continued omeprazole for GERD management.  Recurrent urinary tract infection Recurrent UTIs with current symptoms  - Ordered urine specimen for urinalysis and culture     MASLD -last imaging via CT 2023 -calculate FIB4/SAFE with upcoming labs Instructions for fatty liver: Recommend 1-2# weight loss per week until ideal body weight through exercise & diet. Low  fat/cholesterol diet.   Avoid sweets, sodas, fruit juices, sweetened beverages like tea, etc. Gradually increase exercise from 15 min daily up to 1 hr per day 5 days/week. Limit alcohol use.    Sonny RAMAN. Ezzard, MHS, PA-C [facility]  Addendum: FIB4 0.69, advanced fibrosis excluded. SAFE 40. Intermediate risk of F2+ fibrosis. To discuss getting baseline ELF and update u/s with elastography with patient.  "

## 2024-09-03 NOTE — Patient Instructions (Signed)
 Start Linzess  145mcg daily before breakfast. I have sent in RX to see what medication coverage and cost will be. We have also provided you with samples.   Complete labs and urine specimen. We will be in touch with results. If abdominal pain does not improve, we will pursue CT scan.  Keep me posted on how you are feeling.

## 2024-09-03 NOTE — Progress Notes (Signed)
 Medication Samples have been provided to the patient.  Drug name: LINZESS        Strength:        Qty: 2 BOXES  LOT: [id number]  Exp.Date: 2026/03  Dosing instructions: TAKE 1 TAB DAILY  The patient has been instructed regarding the correct time, dose, and frequency of taking this medication, including desired effects and most common side effects.   Okley Magnussen I Kerryn Tennant 12:16 PM 09/03/2024

## 2024-09-05 ENCOUNTER — Encounter: Attending: Physical Medicine & Rehabilitation | Admitting: Physical Medicine & Rehabilitation

## 2024-09-05 ENCOUNTER — Encounter: Payer: Self-pay | Admitting: Physical Medicine & Rehabilitation

## 2024-09-05 VITALS — BP 156/79 | HR 77 | Ht 63.5 in | Wt 189.0 lb

## 2024-09-05 DIAGNOSIS — G629 Polyneuropathy, unspecified: Secondary | ICD-10-CM | POA: Diagnosis not present

## 2024-09-05 DIAGNOSIS — M1712 Unilateral primary osteoarthritis, left knee: Secondary | ICD-10-CM | POA: Insufficient documentation

## 2024-09-05 MED ORDER — LIDOCAINE HCL 1 % IJ SOLN
4.0000 mL | Freq: Once | INTRAMUSCULAR | Status: AC
  Administered 2024-09-06: 4 mL

## 2024-09-05 MED ORDER — GABAPENTIN 300 MG PO CAPS: 300.0000 mg | ORAL_CAPSULE | Freq: Three times a day (TID) | ORAL | 4 refills | Status: DC

## 2024-09-05 MED ORDER — TRIAMCINOLONE ACETONIDE 40 MG/ML IJ SUSP
40.0000 mg | Freq: Once | INTRAMUSCULAR | Status: AC
  Administered 2024-09-06: 40 mg via INTRA_ARTICULAR

## 2024-09-05 MED ORDER — GABAPENTIN 300 MG PO CAPS: 300.0000 mg | ORAL_CAPSULE | Freq: Three times a day (TID) | ORAL | 4 refills | Status: AC

## 2024-09-05 NOTE — Progress Notes (Signed)
 "  Subjective:    Patient ID: Sarah [name], female    DOB: 08-13-1961, 64 y.o.   MRN: [id number]  HPI  Discussed the use of AI scribe software for clinical note transcription with the patient, who gave verbal consent to proceed.    Sarah [name] is a 64 y.o. year old female  who  has a past medical history of Anxiety, Arthritis, Diabetes mellitus, Diverticulosis, GERD (gastroesophageal reflux disease), Hypercholesteremia, Hypertension, Insulin  pump in place, and Neuropathy.   They are presenting to PM&R clinic as a new patient for pain management evaluation. They were referred by [facility] [place] for treatment of diabetes neuropathy pain.   Red flag symptoms: No red flags for back pain endorsed in Hx or ROS   History of Present Illness: Reports worsening symptoms of diabetic neuropathy over the last 3 years, though present for 8-9 years. - Location: Primarily bilateral feet, bottoms more affected than tops. Some mild numbness extends to the ankles. - Symptoms:     - Daytime: Primarily numbness, with some burning depending on activity level.     - Nighttime: Symptoms are significantly worse, described as burning, painful, and pins and needles. This severely disrupts sleep. - Aggravating Factors: Nighttime. Increased walking. - Relieving Factors: Uncovering feet to allow air circulation. Taking Tylenol  helps slightly. Elevating feet in a recliner provides minimal relief. - Associated Symptoms: Occasional cramping in the tops of the feet and legs. Occasional tingling in the hands, but much less severe than feet. - Pertinent Negatives: Denies significant pain or symptoms up the legs into the calves/knees.  Current Treatments and Efficacy: - Gabapentin  300 mg at night: Reports it is not very effective but continues to take it. - Voltaren  gel: Tried on feet, provided no relief. - Tylenol : Provides slight relief when pain is severe. - Ibuprofen : Avoids due to history of diverticulitis  flare-ups. - Mounjaro : Has resulted in significant weight loss and improved energy levels. A1C has decreased from 12 to 7.3. - TENS unit: Used years ago for a knee injury and found it helpful.  Review of Systems: - General: Reports improved energy and significant weight loss since starting Mounjaro .  Past Medical History: - Diabetic neuropathy - Diabetes mellitus type 2 - Diverticulitis - Arthritis, left knee - History of bunions  Past Surgical History: - Bunionectomy, bilateral - Ingrown toenail removal, left foot - History of non-operative left ankle fracture  Medications: - Gabapentin  300 mg nightly - Lexapro - Mounjaro  - Tylenol  as needed  Allergies: - No known drug allergies.  Social History: - Uses a cane intermittently as advised by a physical therapist due to favoring the left knee.  Medications tried: Topical medications - voltaren - didn't help  Nsaids - Doesn't take much as it flairs diverticulitis  Tylenol   - Helps a little  Opiates  - Denies  Gabapentin - Not sure much benefit TCAs - Denies  SNRIs  ON lexapro, Denies    Other treatments: PT- for knee 7 months ago  TENs unit -Tried it in the past for knee, Injections - Denies  Surgery- Bunion/toenail surgery   Prior UDS results: No results found for: LABOPIA, COCAINSCRNUR, LABBENZ, AMPHETMU, THCU, LABBARB    Interval history 09/05/2024  Patient is here for left knee injection for knee pain.  She continues to have knee pain worsened with activity.  It appears that Qutenza was not approved, however increasing gabapentin  frequency is helping reduce her pain.  She is not having any side effects with the gabapentin .  Pain Inventory Average Pain 8 Pain Right Now 8 My pain is constant, burning, and tingling  In the last 24 hours, has pain interfered with the following? General activity na Relation with others na Enjoyment of life na What TIME of day is your pain at its worst? night Sleep  (in general) Poor  Pain is worse with: some activites Pain improves with: na Relief from Meds: na  worst time is trying to sleep  use a cane do you drive?  yes  retired  numbness tingling anxiety  Any changes since last visit?  no  Any changes since last visit?  no    Family History  Problem Relation Age of Onset   Colon polyps Mother        in her 39s   Diabetes Mother    Hypertension Mother    Heart attack Father    Heart murmur Sister    Hypertension Brother    Colon cancer Maternal Grandfather        73   Diabetes Son    Liver disease Neg Hx    Social History   Socioeconomic History   Marital status: Married    Spouse name: Not on file   Number of children: 1   Years of education: Not on file   Highest education level: Not on file  Occupational History   Occupation: K runner, broadcasting/film/video asst    Employer: [employer]  Tobacco Use   Smoking status: Former    Current packs/day: 0.00    Average packs/day: 1 pack/day for 10.0 years (10.0 ttl pk-yrs)    Types: Cigarettes    Start date: 09/05/1996    Quit date: 09/05/2006    Years since quitting: 18.0   Smokeless tobacco: Never  Vaping Use   Vaping status: Never Used  Substance and Sexual Activity   Alcohol use: Not Currently    Comment: occasional   Drug use: No   Sexual activity: Yes    Birth control/protection: Surgical    Comment: hyst  Other Topics Concern   Not on file  Social History Narrative   Lives w/ husband, sister & her children , teen mom & her baby   Social Drivers of Health   Tobacco Use: Medium Risk (09/05/2024)   Patient History    Smoking Tobacco Use: Former    Smokeless Tobacco Use: Never    Passive Exposure: Not on Actuary Strain: Not on file  Food Insecurity: Not on file  Transportation Needs: Not on file  Physical Activity: Not on file  Stress: Not on file  Social Connections: Not on file  Depression (PHQ2-9): Low Risk (09/05/2024)   Depression (PHQ2-9)     PHQ-2 Score: 0  Recent Concern: Depression (PHQ2-9) - Medium Risk (06/18/2024)   Depression (PHQ2-9)    PHQ-2 Score: 8  Alcohol Screen: Not on file  Housing: Not on file  Utilities: Not on file  Health Literacy: Not on file   Past Surgical History:  Procedure Laterality Date   ABDOMINAL HYSTERECTOMY     complete   CARDIAC CATHETERIZATION  2011   COLONOSCOPY  01/04/2010   left sided transverse diverticula/two diminutive rectal polyps   COLONOSCOPY  03/28/2012   Surgeon: Lamar CHRISTELLA Hollingshead, MD; riable anal canal with suspected trivial rectal bleeding from this location, colonic diverticulosis, s/p segmental resection, single diminutive polyp in the descending colon resected.  Pathology was benign.  Recommended 10-year repeat.   COLONOSCOPY N/A 09/11/2018   Surgeon: Mavis,  Mark, MD;   moderate diverticulosis in the descending colon, otherwise normal exam.  Recommended 10-year repeat.   FOOT SURGERY     right-tendon repair   laparoscopic sigmoid colectomy  02/01/2010   Dr Jenkins-diverticulitis   TRIGGER FINGER RELEASE Left 03/2015   TRIGGER FINGER RELEASE Right 02/25/2016   Procedure: RIGHT LONG TRIGGER FINGER RELEASE;  Surgeon: Taft FORBES Minerva, MD;  Location: [facility];  Service: Orthopedics;  Laterality: Right;   UMBILICAL HERNIA REPAIR N/A 02/05/2020   Procedure: UMBILICAL HERNIORRHAPY WITH MESH;  Surgeon: Mavis Anes, MD;  Location: [facility];  Service: General;  Laterality: N/A;   Past Medical History:  Diagnosis Date   Anxiety    Arthritis    left knee   Diabetes mellitus    type II-pt uses insulin  pump   Diverticulosis    GERD (gastroesophageal reflux disease)    Hypercholesteremia    Hypertension    Insulin  pump in place    DM type II   Neuropathy    BP (!) 156/79   Pulse 77   Wt 189 lb (85.7 kg)   SpO2 95%   BMI 32.95 kg/m   Opioid Risk Score:   Fall Risk Score:  `1  Depression screen [facility] 2/9     09/05/2024    9:29 AM 06/18/2024    1:32 PM 11/20/2014     8:34 AM  Depression screen PHQ 2/9  Decreased Interest 0 1 0  Down, Depressed, Hopeless 0 0 0  PHQ - 2 Score 0 1 0  Altered sleeping  3   Tired, decreased energy  3   Change in appetite  1   Feeling bad or failure about yourself   0   Trouble concentrating  0   Moving slowly or fidgety/restless  0   Suicidal thoughts  0   PHQ-9 Score  8    Difficult doing work/chores  Somewhat difficult      Data saved with a previous flowsheet row definition     Review of Systems  Constitutional:  Positive for diaphoresis.  Gastrointestinal:  Positive for constipation and nausea.  Musculoskeletal:  Positive for gait problem.  Neurological:  Positive for numbness.       Tingling  Psychiatric/Behavioral:  The patient is nervous/anxious.   All other systems reviewed and are negative.      Objective:   Physical Exam  Gen: no distress, normal appearing HEENT: oral mucosa pink and moist, NCAT Chest: normal effort, normal rate of breathing Abd: soft, non-distended Ext: no edema Psych: pleasant, normal affect Skin: intact Neuro: Alert and awake, follows commands, cranial nerves II through XII grossly intact, normal speech and language Moving all 4 extremities to gravity and resistance Altered sensation bilateral feet  MSK: Left knee lateral joint line tenderness, no signs of infection or skin change   Prior exam RUE: 5/5 Deltoid, 5/5 Biceps, 5/5 Triceps, 5/5 Wrist Ext, 5/5 Grip LUE: 5/5 Deltoid, 5/5 Biceps, 5/5 Triceps, 5/5 Wrist Ext, 5/5 Grip RLE: HF 5/5, KE 5/5, ADF 5/5, APF 5/5 LLE: HF 4+/5, KE 4+/5, ADF 4+/5, APF 4+/5- pain limited Sensory exam normal for light touch and pain in all 4 limbs. No limb ataxia or cerebellar signs. No abnormal tone appreciated.    Musculoskeletal:  - Left Knee:     - Inspection: No visible effusion or erythema.     - Palpation: Tenderness to palpation over the lateral joint line. No tenderness over the medial joint line.     - Range  of Motion:  Limited extension. Flexion is painful.  - Neurologic/Lower Extremity:     - Sensation: Significant numbness to light touch from the toes to the ankles bilaterally, more pronounced distally. Normal sensation at the level of the knees. Proprioception is absent in the great toes bilaterally.     - Skin: Feet are inspected. No open wounds, ulcers, or skin breakdown noted. Skin appears intact.  Knee xray 06/14/24 3 views left knee   Normal alignment   Osteophyte seen medially small   Patellofemoral alignment normal with what appears to be narrowing of the lateral facet and the lateral trochlea   Mild grade 2 disease definitive with possibility of joint space narrowing      Assessment & Plan:   ASSESSMENT:  1.  Diabetic Polyneuropathy: Chronic, symptomatic, and progressing. Pain is poorly controlled on current regimen of Gabapentin  300 mg at night, significantly impacting sleep. 2.  Left Knee Osteoarthritis: Chronic, symptomatic. Limited by pain and reduced range of motion. Has declined injections in the past but is now reconsidering.  PLAN:  1.  Diabetic Neuropathy:     - Medication: Increase Gabapentin  dose.  Take 1-2 capsules (300-600 mg total) by mouth 3 (three) times daily. Take 1 capsule in the morning, 1 capsule in the afternoon, 2 capsules at night for 3 to 4 days, if tolerating can increase to 2 capsules in the morning, 1 capsule in the afternoon and 2 capsules at night.  If tolerating for 3 to 4 days can increase to 2 capsules 3 times daily      - Procedure: Discussed Qutenza (capsaicin 8%) patch treatment-not approved by insurance.  Could retry at a later time     - Education: Counseled on the importance of daily foot inspection to monitor for any skin breakdown or wounds, given the profound numbness. Discussed TENS unit as a non-pharmacologic option, noting they are available over-the-counter.  2.  Left Knee Osteoarthritis:    L knee injection   Indication:Knee pain not  relieved by medication management and other conservative care.  Informed consent was obtained after describing risks and benefits of the procedure with the patient, this includes bleeding, bruising, infection and medication side effects. The patient wishes to proceed and has given written consent. The patient was placed in a recumbent position. The Lateral aspect of the knee was marked and prepped with Betadine and alcohol. It was then entered with a 25-gauge 1-1/2 inch needle.  After negative draw back for blood, a solution containing one ML of 40mg  per mL kenalog  and 4 mL of 1% lidocaine  were injected. The patient tolerated the procedure well. Post procedure instructions were given.   3.  Follow-up:     - Follow-up in approximately 3 months for follow-up visit, advised patient to call if she feels like injection is wearing off before this time "

## 2024-09-06 ENCOUNTER — Telehealth: Payer: Self-pay

## 2024-09-06 ENCOUNTER — Ambulatory Visit: Payer: Self-pay | Admitting: Gastroenterology

## 2024-09-06 DIAGNOSIS — K760 Fatty (change of) liver, not elsewhere classified: Secondary | ICD-10-CM

## 2024-09-06 DIAGNOSIS — M1712 Unilateral primary osteoarthritis, left knee: Secondary | ICD-10-CM | POA: Diagnosis not present

## 2024-09-06 MED ORDER — NITROFURANTOIN MONOHYD MACRO 100 MG PO CAPS: 100.0000 mg | ORAL_CAPSULE | Freq: Two times a day (BID) | ORAL | 0 refills | Status: AC

## 2024-09-06 NOTE — Telephone Encounter (Signed)
 Pt called stating that the linzess  is too expensive that her insurance told her that either lubiprostone  or motegrity would be cheaper.

## 2024-09-06 NOTE — Telephone Encounter (Signed)
 Pt was made aware via telephone and verbalized understanding.

## 2024-09-07 LAB — CBC WITH DIFFERENTIAL/PLATELET
Basophils Absolute: 0 x10E3/uL (ref 0.0–0.2)
Basos: 0 %
EOS (ABSOLUTE): 0.1 x10E3/uL (ref 0.0–0.4)
Eos: 1 %
Hematocrit: 41.4 % (ref 34.0–46.6)
Hemoglobin: 13.6 g/dL (ref 11.1–15.9)
Immature Grans (Abs): 0 x10E3/uL (ref 0.0–0.1)
Immature Granulocytes: 0 %
Lymphocytes Absolute: 2.9 x10E3/uL (ref 0.7–3.1)
Lymphs: 38 %
MCH: 28.8 pg (ref 26.6–33.0)
MCHC: 32.9 g/dL (ref 31.5–35.7)
MCV: 88 fL (ref 79–97)
Monocytes Absolute: 0.4 x10E3/uL (ref 0.1–0.9)
Monocytes: 5 %
Neutrophils Absolute: 4.1 x10E3/uL (ref 1.4–7.0)
Neutrophils: 56 %
Platelets: 316 x10E3/uL (ref 150–450)
RBC: 4.72 x10E6/uL (ref 3.77–5.28)
RDW: 14.7 % (ref 11.7–15.4)
WBC: 7.5 x10E3/uL (ref 3.4–10.8)

## 2024-09-07 LAB — COMPREHENSIVE METABOLIC PANEL WITH GFR
ALT: 15 IU/L (ref 0–32)
AST: 13 IU/L (ref 0–40)
Albumin: 4.1 g/dL (ref 3.9–4.9)
Alkaline Phosphatase: 93 IU/L (ref 49–135)
BUN/Creatinine Ratio: 14 (ref 12–28)
BUN: 11 mg/dL (ref 8–27)
Bilirubin Total: 0.2 mg/dL (ref 0.0–1.2)
CO2: 23 mmol/L (ref 20–29)
Calcium: 9.4 mg/dL (ref 8.7–10.3)
Chloride: 103 mmol/L (ref 96–106)
Creatinine, Ser: 0.77 mg/dL (ref 0.57–1.00)
Globulin, Total: 3.4 g/dL (ref 1.5–4.5)
Glucose: 196 mg/dL — ABNORMAL HIGH (ref 70–99)
Potassium: 4.1 mmol/L (ref 3.5–5.2)
Sodium: 143 mmol/L (ref 134–144)
Total Protein: 7.5 g/dL (ref 6.0–8.5)
eGFR: 86 mL/min/1.73

## 2024-09-07 LAB — UA/M W/RFLX CULTURE, ROUTINE
Bilirubin, UA: NEGATIVE
Glucose, UA: NEGATIVE
Nitrite, UA: NEGATIVE
RBC, UA: NEGATIVE
Specific Gravity, UA: 1.026 (ref 1.005–1.030)
Urobilinogen, Ur: 0.2 mg/dL (ref 0.2–1.0)
pH, UA: 5.5 (ref 5.0–7.5)

## 2024-09-07 LAB — MICROSCOPIC EXAMINATION
Casts: NONE SEEN /LPF
RBC, Urine: NONE SEEN /HPF (ref 0–2)

## 2024-09-07 LAB — URINE CULTURE, REFLEX

## 2024-09-09 MED ORDER — LUBIPROSTONE 24 MCG PO CAPS: 24.0000 ug | ORAL_CAPSULE | Freq: Two times a day (BID) | ORAL | 3 refills | Status: AC

## 2024-09-09 NOTE — Addendum Note (Signed)
 Addended by: EZZARD SONNY RAMAN on: 09/09/2024 06:38 AM   Modules accepted: Orders

## 2024-09-09 NOTE — Telephone Encounter (Signed)
 Lmom for return call, see also result note.

## 2024-09-09 NOTE — Telephone Encounter (Signed)
 Please let pt know I sent in lubiprostone , she will start after linzess  samples completed. She can take once or twice daily depending on her response.   See result note.

## 2024-09-10 ENCOUNTER — Other Ambulatory Visit: Payer: Self-pay | Admitting: *Deleted

## 2024-09-10 ENCOUNTER — Encounter: Payer: Self-pay | Admitting: *Deleted

## 2024-09-10 DIAGNOSIS — K760 Fatty (change of) liver, not elsewhere classified: Secondary | ICD-10-CM

## 2024-09-10 NOTE — Telephone Encounter (Signed)
 Pt was made aware and verbalized understanding.

## 2024-09-10 NOTE — Addendum Note (Signed)
 Addended by: WELLINGTON MILLING on: 09/10/2024 11:55 AM   Modules accepted: Orders

## 2024-09-11 ENCOUNTER — Other Ambulatory Visit: Payer: Self-pay

## 2024-09-11 ENCOUNTER — Telehealth

## 2024-09-11 ENCOUNTER — Emergency Department (HOSPITAL_COMMUNITY)
Admission: EM | Admit: 2024-09-11 | Discharge: 2024-09-11 | Disposition: A | Discharge status: Home / Self Care | Attending: Emergency Medicine | Admitting: Emergency Medicine

## 2024-09-11 ENCOUNTER — Emergency Department (HOSPITAL_COMMUNITY)

## 2024-09-11 ENCOUNTER — Encounter (HOSPITAL_COMMUNITY): Payer: Self-pay

## 2024-09-11 DIAGNOSIS — I10 Essential (primary) hypertension: Secondary | ICD-10-CM | POA: Diagnosis not present

## 2024-09-11 DIAGNOSIS — Z79899 Other long term (current) drug therapy: Secondary | ICD-10-CM | POA: Diagnosis not present

## 2024-09-11 DIAGNOSIS — Z7982 Long term (current) use of aspirin: Secondary | ICD-10-CM | POA: Diagnosis not present

## 2024-09-11 DIAGNOSIS — Z794 Long term (current) use of insulin: Secondary | ICD-10-CM | POA: Insufficient documentation

## 2024-09-11 DIAGNOSIS — J101 Influenza due to other identified influenza virus with other respiratory manifestations: Secondary | ICD-10-CM | POA: Diagnosis not present

## 2024-09-11 DIAGNOSIS — Z7984 Long term (current) use of oral hypoglycemic drugs: Secondary | ICD-10-CM | POA: Insufficient documentation

## 2024-09-11 DIAGNOSIS — E119 Type 2 diabetes mellitus without complications: Secondary | ICD-10-CM | POA: Diagnosis not present

## 2024-09-11 DIAGNOSIS — Z91040 Latex allergy status: Secondary | ICD-10-CM | POA: Diagnosis not present

## 2024-09-11 DIAGNOSIS — R509 Fever, unspecified: Secondary | ICD-10-CM | POA: Diagnosis present

## 2024-09-11 LAB — COMPREHENSIVE METABOLIC PANEL WITH GFR
ALT: 23 U/L (ref 0–44)
AST: 33 U/L (ref 15–41)
Albumin: 3.8 g/dL (ref 3.5–5.0)
Alkaline Phosphatase: 74 U/L (ref 38–126)
Anion gap: 14 (ref 5–15)
BUN: 13 mg/dL (ref 8–23)
CO2: 21 mmol/L — ABNORMAL LOW (ref 22–32)
Calcium: 8.9 mg/dL (ref 8.9–10.3)
Chloride: 96 mmol/L — ABNORMAL LOW (ref 98–111)
Creatinine, Ser: 0.81 mg/dL (ref 0.44–1.00)
GFR, Estimated: >60 mL/min
Glucose, Bld: 182 mg/dL — ABNORMAL HIGH (ref 70–99)
Potassium: 4.7 mmol/L (ref 3.5–5.1)
Sodium: 132 mmol/L — ABNORMAL LOW (ref 135–145)
Total Bilirubin: 0.4 mg/dL (ref 0.0–1.2)
Total Protein: 7.8 g/dL (ref 6.5–8.1)

## 2024-09-11 LAB — CBC WITH DIFFERENTIAL/PLATELET
Abs Immature Granulocytes: 0.05 K/uL (ref 0.00–0.07)
Basophils Absolute: 0 K/uL (ref 0.0–0.1)
Basophils Relative: 0 %
Eosinophils Absolute: 0.1 K/uL (ref 0.0–0.5)
Eosinophils Relative: 0 %
HCT: 40.7 % (ref 36.0–46.0)
Hemoglobin: 14.5 g/dL (ref 12.0–15.0)
Immature Granulocytes: 0 %
Lymphocytes Relative: 16 %
Lymphs Abs: 1.8 K/uL (ref 0.7–4.0)
MCH: 28.3 pg (ref 26.0–34.0)
MCHC: 35.6 g/dL (ref 30.0–36.0)
MCV: 79.5 fL — ABNORMAL LOW (ref 80.0–100.0)
Monocytes Absolute: 1.1 K/uL — ABNORMAL HIGH (ref 0.1–1.0)
Monocytes Relative: 10 %
Neutro Abs: 8.2 K/uL — ABNORMAL HIGH (ref 1.7–7.7)
Neutrophils Relative %: 74 %
Platelets: 274 K/uL (ref 150–400)
RBC: 5.12 MIL/uL — ABNORMAL HIGH (ref 3.87–5.11)
RDW: 14.4 % (ref 11.5–15.5)
WBC: 11.2 K/uL — ABNORMAL HIGH (ref 4.0–10.5)
nRBC: 0 % (ref 0.0–0.2)

## 2024-09-11 LAB — RESP PANEL BY RT-PCR (RSV, FLU A&B, COVID)  RVPGX2
Influenza A by PCR: POSITIVE — AB
Influenza B by PCR: NEGATIVE
Resp Syncytial Virus by PCR: NEGATIVE
SARS Coronavirus 2 by RT PCR: NEGATIVE

## 2024-09-11 LAB — TROPONIN T, HIGH SENSITIVITY: Troponin T High Sensitivity: 11 ng/L (ref 0–19)

## 2024-09-11 MED ORDER — IPRATROPIUM-ALBUTEROL 0.5-2.5 (3) MG/3ML IN SOLN
3.0000 mL | Freq: Once | RESPIRATORY_TRACT | Status: AC
  Administered 2024-09-11: 3 mL via RESPIRATORY_TRACT
  Filled 2024-09-11: qty 3

## 2024-09-11 MED ORDER — ACETAMINOPHEN 500 MG PO TABS
1000.0000 mg | ORAL_TABLET | Freq: Once | ORAL | Status: AC
  Administered 2024-09-11: 1000 mg via ORAL
  Filled 2024-09-11: qty 2

## 2024-09-11 MED ORDER — SODIUM CHLORIDE 0.9 % IV BOLUS
1000.0000 mL | Freq: Once | INTRAVENOUS | Status: AC
  Administered 2024-09-11: 1000 mL via INTRAVENOUS

## 2024-09-11 MED ORDER — BENZONATATE 100 MG PO CAPS
200.0000 mg | ORAL_CAPSULE | Freq: Once | ORAL | Status: AC
  Administered 2024-09-11: 200 mg via ORAL
  Filled 2024-09-11: qty 2

## 2024-09-11 MED ORDER — ALBUTEROL SULFATE HFA 108 (90 BASE) MCG/ACT IN AERS
1.0000 | INHALATION_SPRAY | Freq: Once | RESPIRATORY_TRACT | Status: AC
  Administered 2024-09-11: 1 via RESPIRATORY_TRACT
  Filled 2024-09-11: qty 6.7

## 2024-09-11 MED ORDER — BENZONATATE 100 MG PO CAPS: 100.0000 mg | ORAL_CAPSULE | Freq: Three times a day (TID) | ORAL | 0 refills | Status: AC

## 2024-09-11 NOTE — Progress Notes (Signed)
 Referral has been sent to Urology and they will contact the patient

## 2024-09-11 NOTE — ED Triage Notes (Addendum)
 Pt to ED with c/o flu like sx that started yesterday, body aches, headache, cough, fever. Pt has not taken any meds for fever or pain since this morning.

## 2024-09-11 NOTE — ED Provider Notes (Signed)
 " [place] EMERGENCY DEPARTMENT AT [facility] Provider Note   CSN: [id number] Arrival date & time: 09/11/24  2035     Patient presents with: flu like symptoms   Sarah [name] is a 64 y.o. female.  Patient is a 64 year old female with history of type 2 diabetes, hyperlipidemia, hypertension who presents to the ED for increasing flulike symptoms for the past 2 days.  Notes she has had a fever, body aches, and severe cough and congestion.  Notes she was at a basketball game and other resource sick as well.  She has been taking Tylenol  but has not had any since this morning.  Also notes her chest feels very tight and has had some intermittent chest pain.  Endorses headache.  Denies dizziness, syncope, shortness of breath, abdominal pain, nausea/vomiting/diarrhea.   HPI     Prior to Admission medications  Medication Sig Start Date End Date Taking? Authorizing Provider  benzonatate  (TESSALON ) 100 MG capsule Take 1 capsule (100 mg total) by mouth every 8 (eight) hours. 09/11/24  Yes Aaran Enberg, Thersia RAMAN, PA-C  albuterol  (VENTOLIN  HFA) 108 (90 Base) MCG/ACT inhaler SMARTSIG:2 Puff(s) By Mouth Every 4-6 Hours PRN 11/07/23   [provider]  aspirin  EC 81 MG tablet Take 81 mg by mouth daily.    [provider]  Cholecalciferol (VITAMIN D3) 1.25 MG (50000 UT) CAPS Take 1 capsule by mouth once a week. 01/14/22   [provider]  co-enzyme Q-10 30 MG capsule Take 30 mg by mouth daily.    [provider]  Continuous Blood Gluc Sensor (DEXCOM G7 SENSOR) MISC 1 Device by Does not apply route as directed. 10/14/22   Shamleffer, Ibtehal Jaralla, MD  cyclobenzaprine  (FLEXERIL ) 10 MG tablet Take 1 tablet (10 mg total) by mouth 3 (three) times daily as needed for muscle spasms. 04/23/23   Raford Lenis, MD  diclofenac  Sodium (VOLTAREN ) 1 % GEL Apply 2 g topically 4 (four) times daily. 01/27/21   Margrette Taft BRAVO, MD  escitalopram (LEXAPRO) 5 MG tablet Take 10 mg by  mouth at bedtime. 07/22/19   [provider]  gabapentin  (NEURONTIN ) 300 MG capsule Take 1-2 capsules (300-600 mg total) by mouth 3 (three) times daily. Take 1 capsule in the morning, 1 capsule in the afternoon, 2 capsules at night for 3 to 4 days, if tolerating can increase to 2 capsules in the morning, 1 capsule in the afternoon and 2 capsules at night.  If tolerating for 3 to 4 days can increase to 2 capsules 3 times daily 09/05/24   Urbano Albright, MD  ibuprofen  (ADVIL ) 800 MG tablet Take 800 mg by mouth every 6 (six) hours as needed for moderate pain.    [provider]  insulin  degludec (TRESIBA  FLEXTOUCH) 100 UNIT/ML FlexTouch Pen Inject 50 Units into the skin daily. 04/19/24   Shamleffer, Donell Cardinal, MD  Insulin  Pen Needle (PEN NEEDLES) 32G X 4 MM MISC 1 Device by Other route in the morning, at noon, in the evening, and at bedtime. Use to inject insulin  04/19/24   Shamleffer, Ibtehal Jaralla, MD  lisinopril  (ZESTRIL ) 10 MG tablet Take 1 tablet (10 mg total) by mouth daily. 12/18/23   Shamleffer, Ibtehal Jaralla, MD  LORazepam  (ATIVAN ) 0.5 MG tablet Take 0.5 mg by mouth 2 (two) times daily as needed for anxiety. 11/28/19   [provider]  lubiprostone  (AMITIZA ) 24 MCG capsule Take 1 capsule (24 mcg total) by mouth 2 (two) times daily with a meal. 09/09/24  Ezzard Sonny RAMAN, PA-C  nitrofurantoin , macrocrystal-monohydrate, (MACROBID ) 100 MG capsule Take 1 capsule (100 mg total) by mouth 2 (two) times daily for 5 days. 09/06/24 09/11/24  Ezzard Sonny RAMAN, PA-C  omeprazole (PRILOSEC) 20 MG capsule Take 20 mg by mouth daily. 05/12/23   [provider]  tirzepatide  (MOUNJARO ) 5 MG/0.5ML Pen Inject 5 mg into the skin once a week. 04/19/24   Shamleffer, Ibtehal Jaralla, MD    Allergies: Sulfonamide derivatives and Latex    Review of Systems  Constitutional:  Positive for chills and fever.  Respiratory:  Positive for chest tightness. Negative for shortness of breath.    Cardiovascular:  Positive for chest pain.  Gastrointestinal:  Negative for abdominal pain, diarrhea, nausea and vomiting.  Musculoskeletal:  Positive for myalgias.  Neurological:  Positive for headaches. Negative for dizziness and syncope.    Updated Vital Signs BP 121/72   Pulse (!) 112   Temp (!) 101 F (38.3 C) (Oral)   Resp 18   Ht 5' 3 (1.6 m)   Wt 85.7 kg   SpO2 98%   BMI 33.48 kg/m   Physical Exam Constitutional:      Appearance: Normal appearance.  HENT:     Head: Normocephalic and atraumatic.     Nose: Congestion present.     Mouth/Throat:     Mouth: Mucous membranes are moist.     Pharynx: Oropharynx is clear.  Cardiovascular:     Rate and Rhythm: Tachycardia present.  Pulmonary:     Effort: Pulmonary effort is normal.     Breath sounds: Normal breath sounds.  Musculoskeletal:        General: Normal range of motion.  Skin:    General: Skin is warm and dry.  Neurological:     Mental Status: She is alert and oriented to person, place, and time.  Psychiatric:        Mood and Affect: Mood normal.        Behavior: Behavior normal.     (all labs ordered are listed, but only abnormal results are displayed) Labs Reviewed  RESP PANEL BY RT-PCR (RSV, FLU A&B, COVID)  RVPGX2 - Abnormal; Notable for the following components:      Result Value   Influenza A by PCR POSITIVE (*)    All other components within normal limits  COMPREHENSIVE METABOLIC PANEL WITH GFR - Abnormal; Notable for the following components:   Sodium 132 (*)    Chloride 96 (*)    CO2 21 (*)    Glucose, Bld 182 (*)    All other components within normal limits  CBC WITH DIFFERENTIAL/PLATELET - Abnormal; Notable for the following components:   WBC 11.2 (*)    RBC 5.12 (*)    MCV 79.5 (*)    Neutro Abs 8.2 (*)    Monocytes Absolute 1.1 (*)    All other components within normal limits  TROPONIN T, HIGH SENSITIVITY    EKG: None  Radiology: DG Chest Portable 1 View Result Date:  09/11/2024 EXAM: 1 VIEW XRAY OF THE CHEST 09/11/2024 09:37:38 PM COMPARISON: 11/03/2023 CLINICAL HISTORY: fever, cough FINDINGS: LUNGS AND PLEURA: No focal pulmonary opacity. No pleural effusion. No pneumothorax. HEART AND MEDIASTINUM: No acute abnormality of the cardiac and mediastinal silhouettes. BONES AND SOFT TISSUES: No acute osseous abnormality. IMPRESSION: 1. No acute process. Electronically signed by: Franky Crease MD 09/11/2024 09:40 PM EST RP Workstation: [id number]     Medications Ordered in the ED  sodium chloride  0.9 % bolus  1,000 mL (0 mLs Intravenous Stopped 09/11/24 2236)  acetaminophen  (TYLENOL ) tablet 1,000 mg (1,000 mg Oral Given 09/11/24 2131)  ipratropium-albuterol  (DUONEB) 0.5-2.5 (3) MG/3ML nebulizer solution 3 mL (3 mLs Nebulization Given 09/11/24 2211)  albuterol  (VENTOLIN  HFA) 108 (90 Base) MCG/ACT inhaler 1 puff (1 puff Inhalation Given 09/11/24 2239)  benzonatate  (TESSALON ) capsule 200 mg (200 mg Oral Given 09/11/24 2239)                                 Medical Decision Making Patient is a 64 year old female who presents to the ED for cough and fevers for the past 2 days.  POC detailed HPI above.  On exam patient is alert and in no acute distress.  Physical exam as noted above.  She is febrile at 102.6 as well as tachycardic at 125 but in no acute distress.  Differential includes acute viral illness, influenza, COVID, bronchitis, pneumonia.  EKG shows sinus tachycardia.  Initial troponin negative at 8.  Chest x-ray reviewed is negative for acute process.  Mild hyponatremia of 132 noted on labs and mild leukocytosis of 11.2 but otherwise no acute abnormalities.  Swab is positive for influenza A.  Suspect this is most likely cause of patient's symptoms.  She was given a liter of fluids, DuoNeb, and Tylenol  and states that she is starting to feel better.  Notes the fever is breaking.  Heart rate had improved to 105-110 on reevaluation.  Suspect this is elevated secondary to viral  illness.  Less concerns for ACS or cardiac abnormality.  As patient is influenza positive, we do have good cause of patient's illness this evening.  She is otherwise stable and tolerating oral intake.  Stable for discharge home.  Prescribed Tessalon .  Symptomatic care discussed.  Advised PCP follow-up in 1 week for recheck.  Return precautions provided for worsening symptoms.  Amount and/or Complexity of Data Reviewed Labs: ordered. Radiology: ordered.  Risk OTC drugs. Prescription drug management.      Final diagnoses:  Influenza A    ED Discharge Orders          Ordered    benzonatate  (TESSALON ) 100 MG capsule  Every 8 hours        09/11/24 2230               Neysa Thersia GORMAN DEVONNA 09/11/24 2248    Garrick Charleston, MD 09/11/24 2305  "

## 2024-09-11 NOTE — Discharge Instructions (Signed)
 Take Tylenol  1000 mg or ibuprofen  600 mg every 6 hours as needed for fever/chills/body aches.  May take Tessalon  3 times a day as needed for cough.  May use albuterol  inhaler every 4-6 hours as needed for any chest tightness/cough/wheezing.  Stay hydrated and drink plenty of fluids.  Get plenty of rest.  Follow-up with PCP next week if no improvement.  Return to ED if any symptoms worsen including uncontrollable fevers, chest pain, difficulty breathing.

## 2024-09-13 ENCOUNTER — Ambulatory Visit (HOSPITAL_COMMUNITY)
Admission: RE | Admit: 2024-09-13 | Discharge: 2024-09-13 | Disposition: A | Source: Ambulatory Visit | Attending: Gastroenterology | Admitting: Gastroenterology

## 2024-09-13 DIAGNOSIS — K760 Fatty (change of) liver, not elsewhere classified: Secondary | ICD-10-CM | POA: Diagnosis present

## 2024-09-16 ENCOUNTER — Ambulatory Visit: Payer: Self-pay | Admitting: Gastroenterology

## 2024-09-16 DIAGNOSIS — K760 Fatty (change of) liver, not elsewhere classified: Secondary | ICD-10-CM

## 2024-09-16 LAB — ENHANCED LIVER FIBROSIS (ELF): ELF(TM) Score: 10.4 — ABNORMAL HIGH

## 2024-09-21 LAB — ENHANCED LIVER FIBROSIS (ELF): ELF(TM) Score: 10.76 — ABNORMAL HIGH

## 2024-10-23 ENCOUNTER — Ambulatory Visit: Admitting: Internal Medicine

## 2024-12-05 ENCOUNTER — Encounter: Admitting: Physical Medicine & Rehabilitation
